# Patient Record
Sex: Female | Born: 1935 | Race: White | Hispanic: No | Marital: Married | Smoking: Never smoker
Health system: Southern US, Community
[De-identification: ages and names within clinical notes are randomized; demographics above are authoritative.]

## PROBLEM LIST (undated history)

## (undated) DIAGNOSIS — C4490 Unspecified malignant neoplasm of skin, unspecified: Secondary | ICD-10-CM

## (undated) DIAGNOSIS — I5020 Unspecified systolic (congestive) heart failure: Secondary | ICD-10-CM

## (undated) DIAGNOSIS — C801 Malignant (primary) neoplasm, unspecified: Secondary | ICD-10-CM

## (undated) DIAGNOSIS — K589 Irritable bowel syndrome without diarrhea: Secondary | ICD-10-CM

## (undated) DIAGNOSIS — T7840XA Allergy, unspecified, initial encounter: Secondary | ICD-10-CM

## (undated) DIAGNOSIS — R011 Cardiac murmur, unspecified: Secondary | ICD-10-CM

## (undated) DIAGNOSIS — E785 Hyperlipidemia, unspecified: Secondary | ICD-10-CM

## (undated) DIAGNOSIS — Z87442 Personal history of urinary calculi: Secondary | ICD-10-CM

## (undated) DIAGNOSIS — R0609 Other forms of dyspnea: Secondary | ICD-10-CM

## (undated) DIAGNOSIS — M47814 Spondylosis without myelopathy or radiculopathy, thoracic region: Secondary | ICD-10-CM

## (undated) DIAGNOSIS — Z8719 Personal history of other diseases of the digestive system: Secondary | ICD-10-CM

## (undated) DIAGNOSIS — I428 Other cardiomyopathies: Secondary | ICD-10-CM

## (undated) DIAGNOSIS — I471 Supraventricular tachycardia: Secondary | ICD-10-CM

## (undated) DIAGNOSIS — M5414 Radiculopathy, thoracic region: Secondary | ICD-10-CM

## (undated) DIAGNOSIS — F1190 Opioid use, unspecified, uncomplicated: Secondary | ICD-10-CM

## (undated) DIAGNOSIS — I2510 Atherosclerotic heart disease of native coronary artery without angina pectoris: Secondary | ICD-10-CM

## (undated) DIAGNOSIS — K224 Dyskinesia of esophagus: Secondary | ICD-10-CM

## (undated) DIAGNOSIS — H25019 Cortical age-related cataract, unspecified eye: Secondary | ICD-10-CM

## (undated) DIAGNOSIS — M4724 Other spondylosis with radiculopathy, thoracic region: Secondary | ICD-10-CM

## (undated) DIAGNOSIS — I5022 Chronic systolic (congestive) heart failure: Secondary | ICD-10-CM

## (undated) DIAGNOSIS — M4804 Spinal stenosis, thoracic region: Secondary | ICD-10-CM

## (undated) DIAGNOSIS — R937 Abnormal findings on diagnostic imaging of other parts of musculoskeletal system: Secondary | ICD-10-CM

## (undated) DIAGNOSIS — I10 Essential (primary) hypertension: Secondary | ICD-10-CM

## (undated) DIAGNOSIS — K219 Gastro-esophageal reflux disease without esophagitis: Secondary | ICD-10-CM

## (undated) DIAGNOSIS — K229 Disease of esophagus, unspecified: Principal | ICD-10-CM

## (undated) DIAGNOSIS — M8580 Other specified disorders of bone density and structure, unspecified site: Secondary | ICD-10-CM

## (undated) DIAGNOSIS — R931 Abnormal findings on diagnostic imaging of heart and coronary circulation: Secondary | ICD-10-CM

## (undated) DIAGNOSIS — I495 Sick sinus syndrome: Secondary | ICD-10-CM

## (undated) DIAGNOSIS — I4729 Other ventricular tachycardia: Secondary | ICD-10-CM

## (undated) DIAGNOSIS — Z95810 Presence of automatic (implantable) cardiac defibrillator: Secondary | ICD-10-CM

## (undated) DIAGNOSIS — Z981 Arthrodesis status: Secondary | ICD-10-CM

## (undated) DIAGNOSIS — B999 Unspecified infectious disease: Secondary | ICD-10-CM

## (undated) DIAGNOSIS — M1990 Unspecified osteoarthritis, unspecified site: Secondary | ICD-10-CM

## (undated) DIAGNOSIS — Z972 Presence of dental prosthetic device (complete) (partial): Secondary | ICD-10-CM

## (undated) DIAGNOSIS — M5410 Radiculopathy, site unspecified: Secondary | ICD-10-CM

## (undated) DIAGNOSIS — I420 Dilated cardiomyopathy: Secondary | ICD-10-CM

## (undated) DIAGNOSIS — M792 Neuralgia and neuritis, unspecified: Secondary | ICD-10-CM

## (undated) DIAGNOSIS — I509 Heart failure, unspecified: Secondary | ICD-10-CM

## (undated) DIAGNOSIS — I350 Nonrheumatic aortic (valve) stenosis: Secondary | ICD-10-CM

## (undated) HISTORY — PX: NISSEN FUNDOPLICATION: SHX2091

## (undated) HISTORY — DX: Allergy, unspecified, initial encounter: T78.40XA

## (undated) HISTORY — PX: TONSILECTOMY, ADENOIDECTOMY, BILATERAL MYRINGOTOMY AND TUBES: SHX2538

## (undated) HISTORY — DX: Dyskinesia of esophagus: K22.4

## (undated) HISTORY — DX: Hyperlipidemia, unspecified: E78.5

## (undated) HISTORY — DX: Spinal stenosis, thoracic region: M48.04

## (undated) HISTORY — DX: Irritable bowel syndrome without diarrhea: K58.9

## (undated) HISTORY — DX: Neuralgia and neuritis, unspecified: M79.2

## (undated) HISTORY — DX: Other specified disorders of bone density and structure, unspecified site: M85.80

## (undated) HISTORY — DX: Arthrodesis status: Z98.1

## (undated) HISTORY — PX: BLEPHAROPLASTY: SUR158

## (undated) HISTORY — DX: Essential (primary) hypertension: I10

## (undated) HISTORY — DX: Disease of esophagus, unspecified: K22.9

## (undated) HISTORY — DX: Other spondylosis with radiculopathy, thoracic region: M47.24

## (undated) HISTORY — DX: Radiculopathy, thoracic region: M54.14

## (undated) HISTORY — DX: Chronic systolic (congestive) heart failure: I50.22

## (undated) HISTORY — PX: BACK SURGERY: SHX140

## (undated) HISTORY — DX: Opioid use, unspecified, uncomplicated: F11.90

## (undated) HISTORY — DX: Gastro-esophageal reflux disease without esophagitis: K21.9

## (undated) HISTORY — DX: Abnormal findings on diagnostic imaging of other parts of musculoskeletal system: R93.7

## (undated) HISTORY — DX: Supraventricular tachycardia: I47.1

## (undated) HISTORY — PX: ABDOMINAL HYSTERECTOMY: SHX81

## (undated) HISTORY — PX: CHOLECYSTECTOMY: SHX55

## (undated) HISTORY — DX: Spondylosis without myelopathy or radiculopathy, thoracic region: M47.814

## (undated) HISTORY — DX: Unspecified infectious disease: B99.9

## (undated) HISTORY — DX: Radiculopathy, site unspecified: M54.10

## (undated) HISTORY — PX: SYMPATHECTOMY: SHX792

## (undated) HISTORY — PX: APPENDECTOMY: SHX54

---

## 2003-09-05 ENCOUNTER — Other Ambulatory Visit: Payer: Self-pay

## 2004-01-07 ENCOUNTER — Ambulatory Visit: Payer: Self-pay | Admitting: Unknown Physician Specialty

## 2004-01-14 ENCOUNTER — Ambulatory Visit: Payer: Self-pay | Admitting: Unknown Physician Specialty

## 2004-01-16 ENCOUNTER — Ambulatory Visit: Payer: Self-pay | Admitting: Internal Medicine

## 2004-01-17 ENCOUNTER — Ambulatory Visit: Payer: Self-pay | Admitting: Unknown Physician Specialty

## 2004-01-24 ENCOUNTER — Ambulatory Visit: Payer: Self-pay | Admitting: Unknown Physician Specialty

## 2004-06-30 ENCOUNTER — Ambulatory Visit: Payer: Self-pay | Admitting: Physician Assistant

## 2004-07-09 ENCOUNTER — Ambulatory Visit: Payer: Self-pay | Admitting: Internal Medicine

## 2004-08-06 ENCOUNTER — Inpatient Hospital Stay: Payer: Self-pay | Admitting: Unknown Physician Specialty

## 2005-01-20 ENCOUNTER — Ambulatory Visit: Payer: Self-pay | Admitting: Internal Medicine

## 2006-02-10 ENCOUNTER — Ambulatory Visit: Payer: Self-pay | Admitting: Internal Medicine

## 2006-12-15 ENCOUNTER — Other Ambulatory Visit: Payer: Self-pay

## 2006-12-15 ENCOUNTER — Ambulatory Visit: Payer: Self-pay

## 2007-02-14 ENCOUNTER — Ambulatory Visit: Payer: Self-pay | Admitting: Internal Medicine

## 2007-09-27 ENCOUNTER — Ambulatory Visit: Payer: Self-pay

## 2007-10-26 ENCOUNTER — Ambulatory Visit: Payer: Self-pay | Admitting: Urology

## 2007-10-27 ENCOUNTER — Ambulatory Visit: Payer: Self-pay | Admitting: Urology

## 2008-02-15 ENCOUNTER — Ambulatory Visit: Payer: Self-pay | Admitting: Internal Medicine

## 2008-07-10 ENCOUNTER — Ambulatory Visit: Payer: Self-pay | Admitting: Physician Assistant

## 2008-07-13 ENCOUNTER — Ambulatory Visit: Payer: Self-pay | Admitting: Unknown Physician Specialty

## 2008-07-16 ENCOUNTER — Inpatient Hospital Stay: Payer: Self-pay | Admitting: Unknown Physician Specialty

## 2009-02-18 ENCOUNTER — Ambulatory Visit: Payer: Self-pay | Admitting: Internal Medicine

## 2010-02-20 ENCOUNTER — Ambulatory Visit: Payer: Self-pay | Admitting: Internal Medicine

## 2011-02-11 ENCOUNTER — Ambulatory Visit: Payer: Self-pay | Admitting: Cardiology

## 2011-02-24 ENCOUNTER — Ambulatory Visit: Payer: Self-pay | Admitting: Internal Medicine

## 2011-08-28 ENCOUNTER — Ambulatory Visit: Payer: Self-pay | Admitting: Internal Medicine

## 2011-08-28 LAB — CREATININE, SERUM
EGFR (African American): 58 — ABNORMAL LOW
EGFR (Non-African Amer.): 50 — ABNORMAL LOW

## 2011-10-27 ENCOUNTER — Ambulatory Visit: Payer: Self-pay | Admitting: Unknown Physician Specialty

## 2011-10-28 LAB — PATHOLOGY REPORT

## 2012-02-25 ENCOUNTER — Ambulatory Visit: Payer: Self-pay | Admitting: Internal Medicine

## 2013-01-18 ENCOUNTER — Ambulatory Visit: Payer: Self-pay | Admitting: Internal Medicine

## 2013-02-27 ENCOUNTER — Ambulatory Visit: Payer: Self-pay | Admitting: Internal Medicine

## 2013-05-01 ENCOUNTER — Ambulatory Visit: Payer: Self-pay | Admitting: Orthopedic Surgery

## 2013-11-07 DIAGNOSIS — I10 Essential (primary) hypertension: Secondary | ICD-10-CM | POA: Insufficient documentation

## 2013-11-07 DIAGNOSIS — E785 Hyperlipidemia, unspecified: Secondary | ICD-10-CM | POA: Insufficient documentation

## 2013-11-07 DIAGNOSIS — I471 Supraventricular tachycardia: Secondary | ICD-10-CM | POA: Insufficient documentation

## 2013-12-19 DIAGNOSIS — M4310 Spondylolisthesis, site unspecified: Secondary | ICD-10-CM | POA: Insufficient documentation

## 2013-12-23 DIAGNOSIS — I10 Essential (primary) hypertension: Secondary | ICD-10-CM | POA: Insufficient documentation

## 2013-12-23 DIAGNOSIS — E871 Hypo-osmolality and hyponatremia: Secondary | ICD-10-CM | POA: Insufficient documentation

## 2013-12-27 ENCOUNTER — Encounter: Payer: Self-pay | Admitting: Internal Medicine

## 2014-01-04 ENCOUNTER — Encounter: Payer: Self-pay | Admitting: Internal Medicine

## 2014-01-18 DIAGNOSIS — R7309 Other abnormal glucose: Secondary | ICD-10-CM | POA: Insufficient documentation

## 2014-02-04 ENCOUNTER — Encounter: Payer: Self-pay | Admitting: Internal Medicine

## 2014-02-28 ENCOUNTER — Ambulatory Visit: Payer: Self-pay | Admitting: Internal Medicine

## 2014-03-06 ENCOUNTER — Encounter: Payer: Self-pay | Admitting: Internal Medicine

## 2014-04-06 ENCOUNTER — Encounter: Payer: Self-pay | Admitting: Internal Medicine

## 2014-06-18 ENCOUNTER — Ambulatory Visit: Payer: Self-pay | Admitting: Gastroenterology

## 2014-06-20 ENCOUNTER — Ambulatory Visit: Payer: Self-pay | Admitting: Gastroenterology

## 2014-10-29 ENCOUNTER — Other Ambulatory Visit: Payer: Self-pay | Admitting: Internal Medicine

## 2014-10-29 DIAGNOSIS — Z1231 Encounter for screening mammogram for malignant neoplasm of breast: Secondary | ICD-10-CM

## 2014-11-13 ENCOUNTER — Encounter: Payer: Self-pay | Admitting: Gastroenterology

## 2014-11-19 ENCOUNTER — Telehealth: Payer: Self-pay | Admitting: Gastroenterology

## 2014-11-19 NOTE — Telephone Encounter (Signed)
Patient has some questions about maybe having a procedure

## 2014-11-22 ENCOUNTER — Other Ambulatory Visit: Payer: Self-pay

## 2014-11-22 DIAGNOSIS — K589 Irritable bowel syndrome without diarrhea: Secondary | ICD-10-CM | POA: Insufficient documentation

## 2014-11-22 DIAGNOSIS — K219 Gastro-esophageal reflux disease without esophagitis: Secondary | ICD-10-CM | POA: Insufficient documentation

## 2014-11-22 HISTORY — DX: Gastro-esophageal reflux disease without esophagitis: K21.9

## 2014-11-22 HISTORY — DX: Irritable bowel syndrome, unspecified: K58.9

## 2014-11-22 NOTE — Telephone Encounter (Signed)
Pt rescheduled to Monday, August 22nd at 1:30pm due to worsening symptoms.

## 2014-11-26 ENCOUNTER — Ambulatory Visit (INDEPENDENT_AMBULATORY_CARE_PROVIDER_SITE_OTHER): Payer: Medicare Other | Admitting: Gastroenterology

## 2014-11-26 ENCOUNTER — Encounter: Payer: Self-pay | Admitting: Gastroenterology

## 2014-11-26 ENCOUNTER — Other Ambulatory Visit: Payer: Self-pay

## 2014-11-26 VITALS — BP 134/58 | HR 60 | Temp 98.0°F | Ht 63.0 in | Wt 176.0 lb

## 2014-11-26 DIAGNOSIS — K229 Disease of esophagus, unspecified: Principal | ICD-10-CM

## 2014-11-26 DIAGNOSIS — K224 Dyskinesia of esophagus: Secondary | ICD-10-CM

## 2014-11-26 DIAGNOSIS — R05 Cough: Secondary | ICD-10-CM | POA: Insufficient documentation

## 2014-11-26 DIAGNOSIS — R059 Cough, unspecified: Secondary | ICD-10-CM | POA: Insufficient documentation

## 2014-11-26 MED ORDER — DILTIAZEM HCL ER COATED BEADS 120 MG PO CP24: 120.0000 mg | ORAL_CAPSULE | Freq: Every day | ORAL | Status: DC

## 2014-11-26 NOTE — Progress Notes (Signed)
Primary Care Physician: Idelle Crouch, MD  Primary Gastroenterologist:  Dr. Lucilla Lame  Chief Complaint  Patient presents with  . Jackhammer esophagus    worsening symptoms    HPI: Allison Mclean is a 79 y.o. 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 The patient was diagnosed with esophageal L obstruction after having a Nissen fundoplication and esophageal manometry. The patient states that the symptoms bother her all the time including waking her up at night. It is not only when she has EN or drinks something. The patient took omeprazole and states that it did not help so she stopped taking it.  Current Outpatient Prescriptions  Medication Sig Dispense Refill  . aspirin EC 81 MG tablet Take 81 mg by mouth.    . estradiol (ESTRACE) 1 MG tablet Take by mouth.    . fluticasone (FLONASE) 50 MCG/ACT nasal spray USE 2 PUFFS EACH NOSTRIL AT BEDTIME    . hydrochlorothiazide (HYDRODIURIL) 25 MG tablet     . meloxicam (MOBIC) 15 MG tablet TAKE 1 TABLET (15 MG TOTAL) BY MOUTH ONCE DAILY.  1  . metoprolol succinate (TOPROL-XL) 50 MG 24 hr tablet Take 50 mg by mouth daily.  3  . Multiple Vitamins-Minerals (OCUVITE EXTRA PO) Take by mouth.    . pravastatin (PRAVACHOL) 80 MG tablet Take 80 mg by mouth at bedtime.  3  . valsartan-hydrochlorothiazide (DIOVAN-HCT) 160-12.5 MG per tablet Take 1 tablet by mouth daily.  3  . Vitamin D, Cholecalciferol, 1000 UNITS TABS Take by mouth.    Marland Kitchen amitriptyline (ELAVIL) 25 MG tablet Take 25 mg by mouth.    . diltiazem (CARDIZEM CD) 120 MG 24 hr capsule Take 1 capsule (120 mg total) by mouth daily. 30 capsule 2  . omeprazole (PRILOSEC) 40 MG capsule TAKE ONE CAPSULE BY MOUTH IN THE EVENING 1 HOUR BEFORE EVENING MEAL  2   No current facility-administered medications for this visit.    Allergies as of 11/26/2014 - Review Complete 11/26/2014  Allergen Reaction Noted  . Rosuvastatin Other (See Comments) 11/22/2014    ROS:  General: Negative  for anorexia, weight loss, fever, chills, fatigue, weakness. ENT: Negative for hoarseness, difficulty swallowing , nasal congestion. CV: Negative for chest pain, angina, palpitations, dyspnea on exertion, peripheral edema.  Respiratory: Negative for dyspnea at rest, dyspnea on exertion, cough, sputum, wheezing.  GI: See history of present illness. GU:  Negative for dysuria, hematuria, urinary incontinence, urinary frequency, nocturnal urination.  Endo: Negative for unusual weight change.    Physical Examination:   BP 134/58 mmHg  Pulse 60  Temp(Src) 98 F (36.7 C) (Oral)  Ht 5\' 3"  (1.6 m)  Wt 176 lb (79.833 kg)  BMI 31.18 kg/m2  General: Well-nourished, well-developed in no acute distress.  Eyes: No icterus. Conjunctivae pink. Mouth: Oropharyngeal mucosa moist and pink , no lesions erythema or exudate. Lungs: Clear to auscultation bilaterally. Non-labored. Heart: Regular rate and rhythm, no murmurs rubs or gallops.  Abdomen: Bowel sounds are normal, nontender, nondistended, no hepatosplenomegaly or masses, no abdominal bruits or hernia , no rebound or guarding.   Extremities: No lower extremity edema. No clubbing or deformities. Neuro: Alert and oriented x 3.  Grossly intact. Skin: Warm and dry, no jaundice.   Psych: Alert and cooperative, normal mood and affect.  Labs:    Imaging Studies: No results found.  Assessment and Plan:   Allison Mclean is a 79 y.o. 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 The patient has been offered calcium channel blockers versus surgery to undo her antireflux wrap. The patient will be started on diltiazem 120 mg extended release. She will also stop her hydrochlorothiazide because of the fear of hypotension. She has been told to contact me if her medications are causing her to be lightheaded a week. She is also been told to stop the diltiazem if that happens. She will  contact me if her symptoms do not improve.   Note: This dictation was prepared with Dragon dictation along with smaller phrase technology. Any transcriptional errors that result from this process are unintentional.

## 2014-12-12 ENCOUNTER — Telehealth: Payer: Self-pay

## 2014-12-12 NOTE — Telephone Encounter (Signed)
Please advise below.

## 2014-12-12 NOTE — Telephone Encounter (Signed)
Seen by Dr. Allen Norris recently and placed on Diltiazem for Jackhammer Esophagus. States medication is not working and pain in epigastric/Left upper quadrant is getting worse she feels like. Denies Nausea, vomiting, diarrhea, and any other abdominal pain.   Pt states that she is able to eat and drink currently without difficulty, is just having pain while eating.  Placed on schedule on 9/12 at 1345 but patient would like to know if she can do anything else in the meantime to help.

## 2014-12-13 ENCOUNTER — Ambulatory Visit: Payer: Self-pay | Admitting: Gastroenterology

## 2014-12-13 NOTE — Telephone Encounter (Signed)
Please start patient on Cialias 2.5 mg

## 2014-12-13 NOTE — Telephone Encounter (Signed)
LVM for pt to return my call.

## 2014-12-14 ENCOUNTER — Other Ambulatory Visit: Payer: Self-pay

## 2014-12-14 ENCOUNTER — Telehealth: Payer: Self-pay | Admitting: Gastroenterology

## 2014-12-14 DIAGNOSIS — K224 Dyskinesia of esophagus: Secondary | ICD-10-CM

## 2014-12-14 MED ORDER — TADALAFIL 2.5 MG PO TABS: 2.5000 mg | ORAL_TABLET | Freq: Every day | ORAL | Status: DC

## 2014-12-14 NOTE — Telephone Encounter (Signed)
Medication was 290.00 for 90 days. Can she get something else?

## 2014-12-14 NOTE — Telephone Encounter (Signed)
Returned pt's call regarding the cost of the medication. Pt will wait until her appt with [facility] on Monday.

## 2014-12-14 NOTE — Telephone Encounter (Signed)
Pt returned my call and was informed of the new rx by Dr. Allen Norris. Rx was sent to pt's pharmacy.

## 2014-12-17 ENCOUNTER — Ambulatory Visit (INDEPENDENT_AMBULATORY_CARE_PROVIDER_SITE_OTHER): Payer: Medicare Other | Admitting: Gastroenterology

## 2014-12-17 ENCOUNTER — Encounter: Payer: Self-pay | Admitting: Gastroenterology

## 2014-12-17 VITALS — BP 123/59 | HR 62 | Temp 97.9°F | Ht 63.0 in | Wt 176.0 lb

## 2014-12-17 DIAGNOSIS — R0789 Other chest pain: Secondary | ICD-10-CM

## 2014-12-17 NOTE — Progress Notes (Signed)
   Primary Care Physician: Idelle Crouch, MD  Primary Gastroenterologist:  Dr. Lucilla Lame  Chief Complaint  Patient presents with  . Follow-up    still having heartburn pain    HPI: Allison Mclean is a 79 y.o. female here for follow-up of her Barnabas Lister hammer esophagus. The patient has been tried on calcium channel blockers and states that Cialis was too expensive for her. The patient continues to have chest discomfort.  Current Outpatient Prescriptions  Medication Sig Dispense Refill  . aspirin EC 81 MG tablet Take 81 mg by mouth.    . estradiol (ESTRACE) 1 MG tablet Take by mouth.    . fluticasone (FLONASE) 50 MCG/ACT nasal spray USE 2 PUFFS EACH NOSTRIL AT BEDTIME    . hydrochlorothiazide (HYDRODIURIL) 25 MG tablet     . meloxicam (MOBIC) 15 MG tablet TAKE 1 TABLET (15 MG TOTAL) BY MOUTH ONCE DAILY.  1  . metoprolol succinate (TOPROL-XL) 50 MG 24 hr tablet Take 50 mg by mouth daily.  3  . Multiple Vitamins-Minerals (OCUVITE EXTRA PO) Take by mouth.    . pravastatin (PRAVACHOL) 80 MG tablet Take 80 mg by mouth at bedtime.  3  . valsartan-hydrochlorothiazide (DIOVAN-HCT) 160-12.5 MG per tablet Take 1 tablet by mouth daily.  3  . Vitamin D, Cholecalciferol, 1000 UNITS TABS Take by mouth.     No current facility-administered medications for this visit.    Allergies as of 12/17/2014 - Review Complete 12/17/2014  Allergen Reaction Noted  . Rosuvastatin Other (See Comments) 11/22/2014    ROS:  General: Negative for anorexia, weight loss, fever, chills, fatigue, weakness. ENT: Negative for hoarseness, difficulty swallowing , nasal congestion. CV: Negative for chest pain, angina, palpitations, dyspnea on exertion, peripheral edema.  Respiratory: Negative for dyspnea at rest, dyspnea on exertion, cough, sputum, wheezing.  GI: See history of present illness. GU:  Negative for dysuria, hematuria, urinary incontinence, urinary frequency, nocturnal urination.  Endo: Negative for unusual  weight change.    Physical Examination:   BP 123/59 mmHg  Pulse 62  Temp(Src) 97.9 F (36.6 C) (Oral)  Ht 5\' 3"  (1.6 m)  Wt 176 lb (79.833 kg)  BMI 31.18 kg/m2  General: Well-nourished, well-developed in no acute distress.  Eyes: No icterus. Conjunctivae pink. Mouth: Oropharyngeal mucosa moist and pink , no lesions erythema or exudate. Lungs: Clear to auscultation bilaterally. Non-labored. Heart: Regular rate and rhythm, no murmurs rubs or gallops.  Abdomen: Bowel sounds are normal, nontender, nondistended, no hepatosplenomegaly or masses, no abdominal bruits or hernia , no rebound or guarding.   Extremities: No lower extremity edema. No clubbing or deformities. Neuro: Alert and oriented x 3.  Grossly intact. Skin: Warm and dry, no jaundice.   Psych: Alert and cooperative, normal mood and affect.  Labs:    Imaging Studies: No results found.  Assessment and Plan:   Allison Mclean is a 79 y.o. 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 The patient has been tried on calcium channel blockers and continues to have symptoms. The patient was unable to afford Cialis. The patient will be sent to Dr. Duke Salvia for evaluation or possible surgery. The patient has been explained the plan and agrees with it.   Note: This dictation was prepared with Dragon dictation along with smaller phrase technology. Any transcriptional errors that result from this process are unintentional.

## 2014-12-20 ENCOUNTER — Other Ambulatory Visit: Payer: Self-pay

## 2014-12-26 ENCOUNTER — Telehealth: Payer: Self-pay | Admitting: Gastroenterology

## 2014-12-26 NOTE — Telephone Encounter (Signed)
No appointment yet with specialist. They haven't call her.

## 2014-12-27 NOTE — Telephone Encounter (Signed)
Pt scheduled with Dr. Duke Salvia in their [place] office on Monday, Sept 26th.

## 2015-01-01 ENCOUNTER — Other Ambulatory Visit: Payer: Self-pay | Admitting: Bariatrics

## 2015-01-01 DIAGNOSIS — R1310 Dysphagia, unspecified: Secondary | ICD-10-CM

## 2015-01-04 ENCOUNTER — Ambulatory Visit
Admission: RE | Admit: 2015-01-04 | Discharge: 2015-01-04 | Disposition: A | Discharge status: Home / Self Care | Payer: Medicare Other | Source: Ambulatory Visit | Attending: Bariatrics | Admitting: Bariatrics

## 2015-01-04 DIAGNOSIS — R1310 Dysphagia, unspecified: Secondary | ICD-10-CM | POA: Diagnosis not present

## 2015-01-28 ENCOUNTER — Telehealth: Payer: Self-pay | Admitting: Gastroenterology

## 2015-01-28 ENCOUNTER — Emergency Department
Admission: EM | Admit: 2015-01-28 | Discharge: 2015-01-28 | Disposition: A | Discharge status: Home / Self Care | Payer: Medicare Other | Attending: Emergency Medicine | Admitting: Emergency Medicine

## 2015-01-28 ENCOUNTER — Emergency Department: Payer: Medicare Other

## 2015-01-28 ENCOUNTER — Other Ambulatory Visit: Payer: Self-pay

## 2015-01-28 DIAGNOSIS — I10 Essential (primary) hypertension: Secondary | ICD-10-CM | POA: Diagnosis not present

## 2015-01-28 DIAGNOSIS — Z7982 Long term (current) use of aspirin: Secondary | ICD-10-CM | POA: Diagnosis not present

## 2015-01-28 DIAGNOSIS — R079 Chest pain, unspecified: Secondary | ICD-10-CM

## 2015-01-28 DIAGNOSIS — R0789 Other chest pain: Secondary | ICD-10-CM | POA: Insufficient documentation

## 2015-01-28 DIAGNOSIS — Z79899 Other long term (current) drug therapy: Secondary | ICD-10-CM | POA: Diagnosis not present

## 2015-01-28 DIAGNOSIS — Z7951 Long term (current) use of inhaled steroids: Secondary | ICD-10-CM | POA: Diagnosis not present

## 2015-01-28 LAB — BASIC METABOLIC PANEL
Anion gap: 11 (ref 5–15)
BUN: 19 mg/dL (ref 6–20)
CHLORIDE: 93 mmol/L — AB (ref 101–111)
CO2: 30 mmol/L (ref 22–32)
CREATININE: 0.94 mg/dL (ref 0.44–1.00)
Calcium: 9.8 mg/dL (ref 8.9–10.3)
GFR calc Af Amer: >60 mL/min (ref >60)
GFR calc non Af Amer: 57 mL/min — ABNORMAL LOW (ref >60)
Glucose, Bld: 124 mg/dL — ABNORMAL HIGH (ref 65–99)
Potassium: 3.6 mmol/L (ref 3.5–5.1)
Sodium: 134 mmol/L — ABNORMAL LOW (ref 135–145)

## 2015-01-28 LAB — CBC
HEMATOCRIT: 42.9 % (ref 35.0–47.0)
HEMOGLOBIN: 14.6 g/dL (ref 12.0–16.0)
MCH: 31.1 pg (ref 26.0–34.0)
MCHC: 34 g/dL (ref 32.0–36.0)
MCV: 91.2 fL (ref 80.0–100.0)
Platelets: 256 10*3/uL (ref 150–440)
RBC: 4.7 MIL/uL (ref 3.80–5.20)
RDW: 13.3 % (ref 11.5–14.5)
WBC: 7.8 10*3/uL (ref 3.6–11.0)

## 2015-01-28 LAB — TROPONIN I: Troponin I: <0.03 ng/mL (ref <0.031)

## 2015-01-28 MED ORDER — HYDROCODONE-ACETAMINOPHEN 5-325 MG PO TABS: 1.0000 | ORAL_TABLET | Freq: Four times a day (QID) | ORAL | Status: DC | PRN

## 2015-01-28 MED ORDER — GI COCKTAIL ~~LOC~~
30.0000 mL | Freq: Once | ORAL | Status: AC
  Administered 2015-01-28: 30 mL via ORAL
  Filled 2015-01-28: qty 30

## 2015-01-28 MED ORDER — SUCRALFATE 1 G PO TABS: 1.0000 g | ORAL_TABLET | Freq: Four times a day (QID) | ORAL | Status: DC

## 2015-01-28 MED ORDER — HYDROMORPHONE HCL 2 MG PO TABS
2.0000 mg | ORAL_TABLET | Freq: Once | ORAL | Status: AC
  Administered 2015-01-28: 2 mg via ORAL
  Filled 2015-01-28: qty 1

## 2015-01-28 NOTE — ED Provider Notes (Signed)
Time Seen: Approximately 1916  I have reviewed the triage notes  Chief Complaint: Chest Pain   History of Present Illness: Allison Mclean is a 79 y.o. female who  was referred here from the walk-in clinic. Patient's had substernal left-sided chest pain radiating to the back now for the last several months and has been evaluated by multiple gastroenterologist and esophageal experts. Describing a similar pain tonight without any shortness of breath, nausea, vomiting. She denies any changes in the typical pain pattern only had a BC powder earlier today for discomfort. She denies any change in the location, characteristics of the pain that states it's more intense than her usual discomfort. Patient denies any abdominal pain, melena, hematochezia. She states she was taken off the proton pump inhibitor therapy because it "" wasn't or doing her any good "". She states she's had a barium swallow and is been told that she has an issue with her esophagus. She denies any trouble with swallowing and states she doesn't feel that something is stuck as far as food products, etc. She has a history of significant for [employer] plication.   Past Medical History  Diagnosis Date  . Jackhammer esophagus   . Hypertension   . Hyperlipidemia   . GERD (gastroesophageal reflux disease)   . Allergy     Patient Active Problem List   Diagnosis Date Noted  . Cough 11/26/2014  . Arthritis 11/26/2014  . Acid reflux 11/22/2014  . 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 Abnormal blood sugar 01/18/2014  . Essential (primary) hypertension 12/23/2013  . Below normal amount of sodium in the blood 12/23/2013  . Acquired spondylolisthesis 12/19/2013  . Neuralgia neuritis, sciatic nerve 12/19/2013  . HLD (hyperlipidemia) 11/07/2013  . BP (high blood pressure) 11/07/2013  . Supraventricular tachycardia ([place]) 11/07/2013    Past Surgical History  Procedure Laterality Date  . Cholecystectomy    . Abdominal hysterectomy    . Back  surgery      2005, 2006, 2015  . Appendectomy    . Tonsilectomy, adenoidectomy, bilateral myringotomy and tubes      Past Surgical History  Procedure Laterality Date  . Cholecystectomy    . Abdominal hysterectomy    . Back surgery      2005, 2006, 2015  . Appendectomy    . Tonsilectomy, adenoidectomy, bilateral myringotomy and tubes      Current Outpatient Rx  Name  Route  Sig  Dispense  Refill  . aspirin EC 81 MG tablet   Oral   Take 81 mg by mouth.         . benzonatate (TESSALON) 100 MG capsule   Oral   Take 100 mg by mouth 3 (three) times daily.      1   . diltiazem (CARDIZEM CD) 120 MG 24 hr capsule               . estradiol (ESTRACE) 1 MG tablet   Oral   Take by mouth.         . fluticasone (FLONASE) 50 MCG/ACT nasal spray      USE 2 PUFFS EACH NOSTRIL AT BEDTIME         . hydrochlorothiazide (HYDRODIURIL) 25 MG tablet               . meloxicam (MOBIC) 15 MG tablet      TAKE 1 TABLET (15 MG TOTAL) BY MOUTH ONCE DAILY.      1   . metoprolol succinate (TOPROL-XL) 50  MG 24 hr tablet   Oral   Take 50 mg by mouth daily.      3   . Multiple Vitamins-Minerals (OCUVITE EXTRA PO)   Oral   Take by mouth.         . pravastatin (PRAVACHOL) 80 MG tablet   Oral   Take 80 mg by mouth at bedtime.      3   . valsartan-hydrochlorothiazide (DIOVAN-HCT) 160-12.5 MG per tablet   Oral   Take 1 tablet by mouth daily.      3   . Vitamin D, Cholecalciferol, 1000 UNITS TABS   Oral   Take by mouth.           Allergies:  Rosuvastatin  Family History: Family History  Problem Relation Age of Onset  . Diabetes Mother   . Arthritis Mother   . Heart disease Mother   . Hypertension Mother   . Diabetes Father   . Asthma Father   . Heart disease Father   . Hypertension Father   . Stroke Father     Social History: Social History  Substance Use Topics  . Smoking status: Never Smoker   . Smokeless tobacco: Never Used  . Alcohol Use: No      Review of Systems:   10 point review of systems was performed and was otherwise negative:  Constitutional: No fever Eyes: No visual disturbances ENT: No sore throat, ear pain Cardiac: Patient describes some sharp substernal chest pain which has been persistent throughout the day. Respiratory: No shortness of breath, wheezing, or stridor Abdomen: No abdominal pain, no vomiting, No diarrhea Endocrine: No weight loss, No night sweats Extremities: No peripheral edema, cyanosis Skin: No rashes, easy bruising Neurologic: No focal weakness, trouble with speech or swollowing Urologic: No dysuria, Hematuria, or urinary frequency    Physical Exam:  ED Triage Vitals  Enc Vitals Group     BP 01/28/15 1818 131/51 mmHg     Pulse Rate 01/28/15 1818 78     Resp 01/28/15 1818 18     Temp 01/28/15 1818 97.9 F (36.6 C)     Temp Source 01/28/15 1818 Oral     SpO2 01/28/15 1818 100 %     Weight 01/28/15 1818 175 lb (79.379 kg)     Height 01/28/15 1818 5\' 3"  (1.6 m)     Head Cir --      Peak Flow --      Pain Score --      Pain Loc --      Pain Edu? --      Excl. in [place]? --     General: Awake , Alert , and Oriented times 3; GCS 15 Head: Normal cephalic , atraumatic Eyes: Pupils equal , round, reactive to light Nose/Throat: No nasal drainage, patent upper airway without erythema or exudate.  Neck: Supple, Full range of motion, No anterior adenopathy or palpable thyroid masses Lungs: Clear to ascultation without wheezes , rhonchi, or rales Heart: Regular rate, regular rhythm without murmurs , gallops , or rubs Abdomen: Soft, non tender without rebound, guarding , or rigidity; bowel sounds positive and symmetric in all 4 quadrants. No organomegaly .        Extremities: 2 plus symmetric pulses. No edema, clubbing or cyanosis Neurologic: normal ambulation, Motor symmetric without deficits, sensory intact Skin: warm, dry, no rashes Currently no reproducible chest wall  discomfort  Labs:   All laboratory work was reviewed including any pertinent negatives or positives listed  below:  Labs Reviewed  BASIC METABOLIC PANEL - Abnormal; Notable for the following:    Sodium 134 (*)    Chloride 93 (*)    Glucose, Bld 124 (*)    GFR calc non Af Amer 57 (*)    All other components within normal limits  CBC  TROPONIN I   review of the laboratory work shows no significant findings and a negative troponin at this time.  EKG:   ED ECG REPORT I, Daymon Larsen, the attending physician, personally viewed and interpreted this ECG.  Date: 01/28/2015 EKG Time: 1816 Rate: *76 Rhythm: normal sinus rhythm with occasional premature ventricular complexes QRS Axis: Left axis deviation Intervals: normal ST/T Wave abnormalities: Left ventricular hypertrophy Conduction Disutrbances: none Narrative Interpretation: unremarkable No acute ischemic changes are noted  Radiology:CHEST 2 VIEW  COMPARISON: Chest radiograph 07/13/2008  FINDINGS: Stable prominent cardiac and mediastinal contours. Elevation of the right hemidiaphragm. No consolidative pulmonary opacities. No pleural effusion or pneumothorax. Right shoulder joint degenerative changes. Focal kyphosis at the level of the lower thoracic spine were there is age-indeterminate anterior height loss of multiple lower thoracic vertebral bodies. Upper abdominal surgical clips. Radiodensities projecting over lower cervical spine demonstrated on the AP view, nonspecific.  IMPRESSION: No acute cardiopulmonary process.  Nonspecific radiodensities projecting over the lower cervical spine on the AP may represent postsurgical hardware involving the lower cervical spine in the appropriate clinical setting.  Focal kyphosis of the lower thoracic spine were there are multiple age-indeterminate compression deformities. Recommend correlation for lower thoracic spine point tenderness.  I personally reviewed the  radiologic studies       ED Course:  Patient was given a GI cocktail with some symptomatic relief. Differential includes all life-threatening causes for chest pain. This includes but is not exclusive to acute coronary syndrome, aortic dissection, pulmonary embolism, cardiac tamponade, community-acquired pneumonia, pericarditis, musculoskeletal chest wall pain, etc. I felt given her history, review of systems and objective studies is most likely was gastrointestinal related. She has a known history of esophagitis and gastritis. Patient's been seen by gastroenterologist along with a esophageal X [place] had a barium swallow, etc. This doesn't seem like new pathology at this time and the objective studies were within normal limits.   Assessment: Gastrointestinal chest pain     Plan:  Outpatient management Patient was advised to return immediately if condition worsens. Patient was advised to follow up with her primary care physician or other specialized physicians involved and in their current assessment.             Daymon Larsen, MD 01/28/15 2053

## 2015-01-28 NOTE — Telephone Encounter (Signed)
Patient is in pain and would like you to call her back today.

## 2015-01-28 NOTE — ED Notes (Addendum)
Patient comes in complaining of left side chest pain that radiated down to left breast. Chest pain in not constant. Denies nausea, vomiting, or shortness of breath.

## 2015-01-28 NOTE — Discharge Instructions (Signed)
Nonspecific Chest Pain  °Chest pain can be caused by many different conditions. There is always a chance that your pain could be related to something serious, such as a heart attack or a blood clot in your lungs. Chest pain can also be caused by conditions that are not life-threatening. If you have chest pain, it is very important to follow up with your health care provider. °CAUSES  °Chest pain can be caused by: °· Heartburn. °· Pneumonia or bronchitis. °· Anxiety or stress. °· Inflammation around your heart (pericarditis) or lung (pleuritis or pleurisy). °· A blood clot in your lung. °· A collapsed lung (pneumothorax). It can develop suddenly on its own (spontaneous pneumothorax) or from trauma to the chest. °· Shingles infection (varicella-zoster virus). °· Heart attack. °· Damage to the bones, muscles, and cartilage that make up your chest wall. This can include: °¨ Bruised bones due to injury. °¨ Strained muscles or cartilage due to frequent or repeated coughing or overwork. °¨ Fracture to one or more ribs. °¨ Sore cartilage due to inflammation (costochondritis). °RISK FACTORS  °Risk factors for chest pain may include: °· Activities that increase your risk for trauma or injury to your chest. °· Respiratory infections or conditions that cause frequent coughing. °· Medical conditions or overeating that can cause heartburn. °· Heart disease or family history of heart disease. °· Conditions or health behaviors that increase your risk of developing a blood clot. °· Having had chicken pox (varicella zoster). °SIGNS AND SYMPTOMS °Chest pain can feel like: °· Burning or tingling on the surface of your chest or deep in your chest. °· Crushing, pressure, aching, or squeezing pain. °· Dull or sharp pain that is worse when you move, cough, or take a deep breath. °· Pain that is also felt in your back, neck, shoulder, or arm, or pain that spreads to any of these areas. °Your chest pain may come and go, or it may stay  constant. °DIAGNOSIS °Lab tests or other studies may be needed to find the cause of your pain. Your health care provider may have you take a test called an ambulatory ECG (electrocardiogram). An ECG records your heartbeat patterns at the time the test is performed. You may also have other tests, such as: °· Transthoracic echocardiogram (TTE). During echocardiography, sound waves are used to create a picture of all of the heart structures and to look at how blood flows through your heart. °· Transesophageal echocardiogram (TEE). This is a more advanced imaging test that obtains images from inside your body. It allows your health care provider to see your heart in finer detail. °· Cardiac monitoring. This allows your health care provider to monitor your heart rate and rhythm in real time. °· Holter monitor. This is a portable device that records your heartbeat and can help to diagnose abnormal heartbeats. It allows your health care provider to track your heart activity for several days, if needed. °· Stress tests. These can be done through exercise or by taking medicine that makes your heart beat more quickly. °· Blood tests. °· Imaging tests. °TREATMENT  °Your treatment depends on what is causing your chest pain. Treatment may include: °· Medicines. These may include: °¨ Acid blockers for heartburn. °¨ Anti-inflammatory medicine. °¨ Pain medicine for inflammatory conditions. °¨ Antibiotic medicine, if an infection is present. °¨ Medicines to dissolve blood clots. °¨ Medicines to treat coronary artery disease. °· Supportive care for conditions that do not require medicines. This may include: °¨ Resting. °¨ Applying heat   or cold packs to injured areas.  Limiting activities until pain decreases. HOME CARE INSTRUCTIONS  If you were prescribed an antibiotic medicine, finish it all even if you start to feel better.  Avoid any activities that bring on chest pain.  Do not use any tobacco products, including  cigarettes, chewing tobacco, or electronic cigarettes. If you need help quitting, ask your health care provider.  Do not drink alcohol.  Take medicines only as directed by your health care provider.  Keep all follow-up visits as directed by your health care provider. This is important. This includes any further testing if your chest pain does not go away.  If heartburn is the cause for your chest pain, you may be told to keep your head raised (elevated) while sleeping. This reduces the chance that acid will go from your stomach into your esophagus.  Make lifestyle changes as directed by your health care provider. These may include:  Getting regular exercise. Ask your health care provider to suggest some activities that are safe for you.  Eating a heart-healthy diet. A registered dietitian can help you to learn healthy eating options.  Maintaining a healthy weight.  Managing diabetes, if necessary.  Reducing stress. SEEK MEDICAL CARE IF:  Your chest pain does not go away after treatment.  You have a rash with blisters on your chest.  You have a fever. SEEK IMMEDIATE MEDICAL CARE IF:   Your chest pain is worse.  You have an increasing cough, or you cough up blood.  You have severe abdominal pain.  You have severe weakness.  You faint.  You have chills.  You have sudden, unexplained chest discomfort.  You have sudden, unexplained discomfort in your arms, back, neck, or jaw.  You have shortness of breath at any time.  You suddenly start to sweat, or your skin gets clammy.  You feel nauseous or you vomit.  You suddenly feel light-headed or dizzy.  Your heart begins to beat quickly, or it feels like it is skipping beats. These symptoms may represent a serious problem that is an emergency. Do not wait to see if the symptoms will go away. Get medical help right away. Call your local emergency services (911 in the [place] Do not drive yourself to the hospital.   This  information is not intended to replace advice given to you by your health care provider. Make sure you discuss any questions you have with your health care provider.   Document Released: 12/31/2004 Document Revised: 04/13/2014 Document Reviewed: 10/27/2013 Elsevier Interactive Patient Education [employer].  Please return immediately if condition worsens. Please contact her primary physician or the physician you were given for referral. If you have any specialist physicians involved in her treatment and plan please also contact them. Thank you for using [facility]. Please avoid any aspirin or nonsteroidal-related products. Tylenol over-the-counter for pain as needed.You also can use Maalox or Mylanta.

## 2015-01-28 NOTE — ED Notes (Signed)
Pt given medication at this time, pt has never taken dilaudid before so will stay here for a few minutes to be monitored for reaction

## 2015-01-28 NOTE — Telephone Encounter (Signed)
Returned Allison Mclean's call regarding her pain. Allison Mclean stating she is hurting under her left breast and this radiates to her back. See note from Dr. Duke Salvia. She stated he told her she wouldn't benefit from doing anything to her esophagus because he thinks it would cause more problems. Allison Mclean has tried probiotics and gas relief pills with no success. Please advise.

## 2015-01-30 NOTE — Telephone Encounter (Signed)
Called pt to see how she was doing since she went to the ER to her pain. Pt stated the medication they gave her is just masking her pain. It is making her feel bad. Advised pt Dr. Allen Norris would like her to go to [facility] to an esophageal specialist. Pt has an appt with PCP on Monday and will discuss this with him then. If she decides to do the referral she will call me back and we can schedule.

## 2015-01-30 NOTE — Telephone Encounter (Signed)
Please refer her to [facility].

## 2015-02-04 ENCOUNTER — Other Ambulatory Visit: Payer: Self-pay | Admitting: Internal Medicine

## 2015-02-04 DIAGNOSIS — R079 Chest pain, unspecified: Secondary | ICD-10-CM

## 2015-02-05 ENCOUNTER — Other Ambulatory Visit: Payer: Self-pay | Admitting: Cardiology

## 2015-02-05 DIAGNOSIS — R0789 Other chest pain: Secondary | ICD-10-CM | POA: Insufficient documentation

## 2015-02-07 ENCOUNTER — Encounter: Payer: Self-pay | Admitting: *Deleted

## 2015-02-07 ENCOUNTER — Ambulatory Visit
Admission: RE | Admit: 2015-02-07 | Discharge: 2015-02-07 | Disposition: A | Discharge status: Home / Self Care | Payer: Medicare Other | Source: Ambulatory Visit | Attending: Cardiology | Admitting: Cardiology

## 2015-02-07 ENCOUNTER — Encounter
Admission: RE | Disposition: A | Discharge status: Home / Self Care | Payer: Self-pay | Source: Ambulatory Visit | Attending: Cardiology

## 2015-02-07 DIAGNOSIS — Z7982 Long term (current) use of aspirin: Secondary | ICD-10-CM | POA: Insufficient documentation

## 2015-02-07 DIAGNOSIS — K219 Gastro-esophageal reflux disease without esophagitis: Secondary | ICD-10-CM | POA: Diagnosis not present

## 2015-02-07 DIAGNOSIS — R079 Chest pain, unspecified: Secondary | ICD-10-CM | POA: Insufficient documentation

## 2015-02-07 DIAGNOSIS — E669 Obesity, unspecified: Secondary | ICD-10-CM | POA: Diagnosis not present

## 2015-02-07 DIAGNOSIS — Z9049 Acquired absence of other specified parts of digestive tract: Secondary | ICD-10-CM | POA: Insufficient documentation

## 2015-02-07 DIAGNOSIS — Z7951 Long term (current) use of inhaled steroids: Secondary | ICD-10-CM | POA: Diagnosis not present

## 2015-02-07 DIAGNOSIS — K589 Irritable bowel syndrome without diarrhea: Secondary | ICD-10-CM | POA: Insufficient documentation

## 2015-02-07 DIAGNOSIS — Z8679 Personal history of other diseases of the circulatory system: Secondary | ICD-10-CM | POA: Diagnosis not present

## 2015-02-07 DIAGNOSIS — Z833 Family history of diabetes mellitus: Secondary | ICD-10-CM | POA: Insufficient documentation

## 2015-02-07 DIAGNOSIS — Z79899 Other long term (current) drug therapy: Secondary | ICD-10-CM | POA: Insufficient documentation

## 2015-02-07 DIAGNOSIS — E785 Hyperlipidemia, unspecified: Secondary | ICD-10-CM | POA: Insufficient documentation

## 2015-02-07 DIAGNOSIS — Z825 Family history of asthma and other chronic lower respiratory diseases: Secondary | ICD-10-CM | POA: Insufficient documentation

## 2015-02-07 DIAGNOSIS — Z791 Long term (current) use of non-steroidal anti-inflammatories (NSAID): Secondary | ICD-10-CM | POA: Diagnosis not present

## 2015-02-07 DIAGNOSIS — I10 Essential (primary) hypertension: Secondary | ICD-10-CM | POA: Insufficient documentation

## 2015-02-07 DIAGNOSIS — Z8249 Family history of ischemic heart disease and other diseases of the circulatory system: Secondary | ICD-10-CM | POA: Insufficient documentation

## 2015-02-07 HISTORY — PX: CARDIAC CATHETERIZATION: SHX172

## 2015-02-07 SURGERY — LEFT HEART CATH AND CORONARY ANGIOGRAPHY
Anesthesia: Moderate Sedation

## 2015-02-07 MED ORDER — HEPARIN (PORCINE) IN NACL 2-0.9 UNIT/ML-% IJ SOLN
INTRAMUSCULAR | Status: AC
  Filled 2015-02-07: qty 1000

## 2015-02-07 MED ORDER — MIDAZOLAM HCL 2 MG/2ML IJ SOLN
INTRAMUSCULAR | Status: DC | PRN
  Administered 2015-02-07: 1 mg via INTRAVENOUS

## 2015-02-07 MED ORDER — SODIUM CHLORIDE 0.9 % WEIGHT BASED INFUSION: 1.0000 mL/kg/h | INTRAVENOUS | Status: DC

## 2015-02-07 MED ORDER — IOHEXOL 300 MG/ML  SOLN
INTRAMUSCULAR | Status: DC | PRN
  Administered 2015-02-07: 30 mL via INTRA_ARTERIAL
  Administered 2015-02-07: 100 mL via INTRA_ARTERIAL
  Administered 2015-02-07: 40 mL via INTRA_ARTERIAL

## 2015-02-07 MED ORDER — FENTANYL CITRATE (PF) 100 MCG/2ML IJ SOLN
INTRAMUSCULAR | Status: DC | PRN
  Administered 2015-02-07: 50 ug via INTRAVENOUS

## 2015-02-07 MED ORDER — SODIUM CHLORIDE 0.9 % IJ SOLN: 3.0000 mL | Freq: Two times a day (BID) | INTRAMUSCULAR | Status: DC

## 2015-02-07 MED ORDER — MIDAZOLAM HCL 2 MG/2ML IJ SOLN
INTRAMUSCULAR | Status: AC
  Filled 2015-02-07: qty 2

## 2015-02-07 MED ORDER — FENTANYL CITRATE (PF) 100 MCG/2ML IJ SOLN
INTRAMUSCULAR | Status: AC
  Filled 2015-02-07: qty 2

## 2015-02-07 MED ORDER — SODIUM CHLORIDE 0.9 % IV SOLN
INTRAVENOUS | Status: DC
  Administered 2015-02-07 (×2): via INTRAVENOUS

## 2015-02-07 MED ORDER — SODIUM CHLORIDE 0.9 % IV SOLN: 250.0000 mL | INTRAVENOUS | Status: DC | PRN

## 2015-02-07 MED ORDER — SODIUM CHLORIDE 0.9 % IJ SOLN: 3.0000 mL | INTRAMUSCULAR | Status: DC | PRN

## 2015-02-07 SURGICAL SUPPLY — 13 items
CATH AMP RT 5F (CATHETERS) ×3 IMPLANT
CATH INFINITI 5 FR 3DRC (CATHETERS) ×3 IMPLANT
CATH INFINITI 5 FR JR5 (CATHETERS) ×3 IMPLANT
CATH INFINITI 5 FR MPA2 (CATHETERS) ×3 IMPLANT
CATH INFINITI 5FR ANG PIGTAIL (CATHETERS) ×3 IMPLANT
CATH INFINITI 5FR JL4 (CATHETERS) ×3 IMPLANT
CATH INFINITI JR4 5F (CATHETERS) ×3 IMPLANT
DEVICE CLOSURE MYNXGRIP 5F (Vascular Products) ×3 IMPLANT
KIT MANI 3VAL PERCEP (MISCELLANEOUS) ×3 IMPLANT
NEEDLE PERC 18GX7CM (NEEDLE) ×3 IMPLANT
PACK CARDIAC CATH (CUSTOM PROCEDURE TRAY) ×3 IMPLANT
SHEATH AVANTI 5FR X 11CM (SHEATH) ×3 IMPLANT
WIRE EMERALD 3MM-J .035X150CM (WIRE) ×3 IMPLANT

## 2015-02-07 NOTE — Progress Notes (Signed)
[  pt doing well post cardiac cath, right groin without bleeding nor hematoma, daughter present, denies complaints, taking po';s without difficulty. rtc appt given,

## 2015-02-07 NOTE — Discharge Instructions (Signed)
Angiogram, Care After °Refer to this sheet in the next few weeks. These instructions provide you with information about caring for yourself after your procedure. Your health care provider may also give you more specific instructions. Your treatment has been planned according to current medical practices, but problems sometimes occur. Call your health care provider if you have any problems or questions after your procedure. °WHAT TO EXPECT AFTER THE PROCEDURE °After your procedure, it is typical to have the following: °· Bruising at the catheter insertion site that usually fades within 1-2 weeks. °· Blood collecting in the tissue (hematoma) that may be painful to the touch. It should usually decrease in size and tenderness within 1-2 weeks. °HOME CARE INSTRUCTIONS °· Take medicines only as directed by your health care provider. °· You may shower 24-48 hours after the procedure or as directed by your health care provider. Remove the bandage (dressing) and gently wash the site with plain soap and water. Pat the area dry with a clean towel. Do not rub the site, because this may cause bleeding. °· Do not take baths, swim, or use a hot tub until your health care provider approves. °· Check your insertion site every day for redness, swelling, or drainage. °· Do not apply powder or lotion to the site. °· Do not lift over 10 lb (4.5 kg) for 5 days after your procedure or as directed by your health care provider. °· Ask your health care provider when it is okay to: °¨ Return to work or school. °¨ Resume usual physical activities or sports. °¨ Resume sexual activity. °· Do not drive home if you are discharged the same day as the procedure. Have someone else drive you. °· You may drive 24 hours after the procedure unless otherwise instructed by your health care provider. °· Do not operate machinery or power tools for 24 hours after the procedure or as directed by your health care provider. °· If your procedure was done as an  outpatient procedure, which means that you went home the same day as your procedure, a responsible adult should be with you for the first 24 hours after you arrive home. °· Keep all follow-up visits as directed by your health care provider. This is important. °SEEK MEDICAL CARE IF: °· You have a fever. °· You have chills. °· You have increased bleeding from the catheter insertion site. Hold pressure on the site. °SEEK IMMEDIATE MEDICAL CARE IF: °· You have unusual pain at the catheter insertion site. °· You have redness, warmth, or swelling at the catheter insertion site. °· You have drainage (other than a small amount of blood on the dressing) from the catheter insertion site. °· The catheter insertion site is bleeding, and the bleeding does not stop after 30 minutes of holding steady pressure on the site. °· The area near or just beyond the catheter insertion site becomes pale, cool, tingly, or numb. °  °This information is not intended to replace advice given to you by your health care provider. Make sure you discuss any questions you have with your health care provider. °  °Document Released: 10/09/2004 Document Revised: 04/13/2014 Document Reviewed: 08/24/2012 °Elsevier Interactive Patient Education ©2016 Elsevier Inc. ° °

## 2015-02-07 NOTE — H&P (Signed)
Date of Service: 02/05/2015 Date of Birth: 1936-02-25 PCP: Idelle Crouch, MD, MD  History of Present Illness: Allison Mclean is a 79 y.o.female patient who returns for follow-up visit. Has a history hypertension as well as hyperlipidemia. She also has a history of paroxysmal supraventricular tachycardia. She. Recently has begun noting chest pain which is severe radiating from the middle of her sternum back to her back down the left arm and into her shoulder. Pain is extremely severe. She was evaluated by a gastroenterology and shows no significant abnormalities with her esophagus. It is of note that she is status post Nissen fundoplication. She has an exertional component to it and to some extent. The causes her to be short of breath when the episodes occur. She has had a functional study an echocardiogram within the last year showing no significant abnormalities. The symptoms have progressed in a becoming more debilitating. Risk and benefits a left cardiac catheterization were explained to the patient and she agrees to proceed. Past Medical and Surgical History  Past Medical History Past Medical History  Diagnosis Date  . Allergic state  Environmental  . Cataract cortical, senile  . Chronic cough  . GERD (gastroesophageal reflux disease)  . History of nephrolithiasis  . History of shingles  . Hyperlipidemia  . Hypertension  . IBS (irritable bowel syndrome)  . Obesity  . SVT (supraventricular tachycardia)   Past Surgical History She has a past surgical history that includes Hysterectomy; Cholecystectomy; Appendectomy; Hemorrhoidectomy External (09/28/01); Tonsillectomy; Posterior laminectomy / decompression lumbar spine (09/11/03); Laparoscopic Esophagogastric Fundoplasty (Nissen Procedure) (2007); Anterior fusion cervical spine; and Anterior fusion cervical spine (07/16/08).   Medications and Allergies  Current Medications  Current Outpatient Prescriptions  Medication Sig Dispense Refill  .  aspirin 81 MG EC tablet Take 81 mg by mouth once daily.  . benzonatate (TESSALON) 100 MG capsule Take by mouth.  . calcium citrate-vitamin D3 (CITRACAL+D) 315-200 mg-unit tablet Take 2 tablets by mouth once daily.  Marland Kitchen estradiol (ESTRACE) 1 MG tablet Take 1 tablet (1 mg total) by mouth once daily. 90 tablet 3  . fluticasone (FLONASE) 50 mcg/actuation nasal spray USE 2 PUFFS EACH NOSTRIL AT BEDTIME 16 g 5  . hydrochlorothiazide (HYDRODIURIL) 25 MG tablet Take 1 tablet (25 mg total) by mouth once daily. 90 tablet 3  . HYDROcodone-acetaminophen (NORCO) 5-325 mg tablet  . meloxicam (MOBIC) 15 MG tablet Take 1 tablet (15 mg total) by mouth once daily. 90 tablet 1  . metoprolol succinate (TOPROL-XL) 50 MG XL tablet Take 1 tablet (50 mg total) by mouth once daily. 90 tablet 3  . pravastatin (PRAVACHOL) 80 MG tablet TAKE 1 TABLET BY MOUTH AT BEDTIME 90 tablet 3  . sucralfate (CARAFATE) 1 gram tablet  . valsartan-hydrochlorothiazide (DIOVAN-HCT) 160-12.5 mg tablet Take 1 tablet by mouth once daily. 90 tablet 3  . vit C-vit E-lutein-min-om-3 150-30-5-150 mg-unit-mg-mg Cap Take 1 capsule by mouth once daily.   No current facility-administered medications for this visit.   Allergies: Crestor [rosuvastatin]  Social and Family History  Social History reports that she has never smoked. She does not have any smokeless tobacco history on file. She reports that she does not drink alcohol.  Family History Family History  Problem Relation Age of Onset  . Heart attack Mother  . Hypertension Mother  . Diabetes type II Mother  . Heart attack Father  . Hypertension Father  . Prostate cancer Father  . Diabetes type II Father  . Stroke Father  . Asthma  Father  . Diabetes type II Sister   Review of Systems  Review of Systems  Constitutional: Negative for chills, diaphoresis, fever, malaise/fatigue and weight loss.  HENT: Negative for congestion, ear discharge, hearing loss and tinnitus.  Eyes: Negative  for blurred vision.  Respiratory: Negative for cough, hemoptysis, sputum production, shortness of breath and wheezing.  Cardiovascular: Positive for chest pain. Negative for palpitations, orthopnea, claudication, leg swelling and PND.  Gastrointestinal: Negative for abdominal pain, blood in stool, constipation, diarrhea, heartburn, melena, nausea and vomiting.  Genitourinary: Negative for dysuria, frequency, hematuria and urgency.  Musculoskeletal: Negative for back pain, falls, joint pain and myalgias.  Skin: Negative for itching and rash.  Neurological: Negative for dizziness, tingling, focal weakness, loss of consciousness, weakness and headaches.  Endo/Heme/Allergies: Negative for polydipsia. Does not bruise/bleed easily.  Psychiatric/Behavioral: Negative for depression, memory loss and substance abuse. The patient is not nervous/anxious.    Physical Examination   Vitals: Visit Vitals  . BP 132/70 (BP Location: Left upper arm, Patient Position: Sitting, BP Cuff Size: Adult)  . Pulse 76  . Resp 12  . Ht 160 cm (5\' 3" )  . Wt 79.4 kg (175 lb)  . BMI 31 kg/m2   Ht:160 cm (5\' 3" ) Wt:79.4 kg (175 lb) MCN:OBSJ surface area is 1.88 meters squared. Body mass index is 31 kg/(m^2).  Wt Readings from Last 3 Encounters:  02/05/15 79.4 kg (175 lb)  02/04/15 79.4 kg (175 lb)  12/20/14 78.9 kg (174 lb)   BP Readings from Last 3 Encounters:  02/05/15 132/70  02/04/15 120/60  12/20/14 128/64   General appearance appears in no acute distress  Head Mouth and Eye exam Normocephalic, without obvious abnormality, atraumatic Dentition is good Eyes appear anicteric   Neck exam Thyroid: normal  Nodes: no obvious adenopathy  LUNGS Breath Sounds: Normal Percussion: Normal  CARDIOVASCULAR JVP CV wave: no HJR: no Elevation at 90 degrees: None Carotid Pulse: normal pulsation bilaterally Bruit: None Apex: apical impulse normal  Auscultation Rhythm: normal sinus rhythm S1:  normal S2: normal Clicks: no Rub: no Murmurs: no murmurs  Gallop: None ABDOMEN Liver enlargement: no Pulsatile aorta: no Ascites: no Bruits: no  EXTREMITIES Clubbing: no Edema: trace to 1+ bilateral pedal edema Pulses: peripheral pulses symmetrical Femoral Bruits: no Amputation: no SKIN Rash: no Cyanosis: no Embolic phemonenon: no Bruising: no NEURO Alert and Oriented to person, place and time: yes Non focal: yes  PSYCH: Pt appears to have normal affect  LABS REVIEWED Last 3 CBC results: Lab Results  Component Value Date  WBC 5.0 10/22/2014  WBC 5.7 04/17/2014  WBC 5.5 01/11/2014   Lab Results  Component Value Date  HGB 14.0 10/22/2014  HGB 13.6 04/17/2014  HGB 12.1 (L) 01/11/2014   Lab Results  Component Value Date  HCT 43.1 10/22/2014  HCT 41.4 04/17/2014  HCT 37.9 01/11/2014   Lab Results  Component Value Date  PLT 236 10/22/2014  PLT 260 04/17/2014  PLT 372 01/11/2014   Lab Results  Component Value Date  CREATININE 0.8 10/22/2014  BUN 9 10/22/2014  NA 134 (L) 10/22/2014  K 4.0 10/22/2014  CL 94 (L) 10/22/2014  CO2 36.0 (H) 10/22/2014   Lab Results  Component Value Date  HGBA1C 5.8 (H) 10/22/2014   Lab Results  Component Value Date  HDL 69.1 10/22/2014  HDL 73.4 04/17/2014  HDL 55.2 01/11/2014   Lab Results  Component Value Date  LDLCALC 117 10/22/2014  LDLCALC 128 04/17/2014  LDLCALC 132 (H) 01/11/2014  Lab Results  Component Value Date  TRIG 140 10/22/2014  TRIG 157 04/17/2014  TRIG 207 (H) 01/11/2014   Lab Results  Component Value Date  ALT 12 10/22/2014  AST 19 10/22/2014  ALKPHOS 47 10/22/2014   Lab Results  Component Value Date  TSH 1.900 10/22/2014   Assessment and Plan   79 y.o. female with  ICD-10-CM ICD-9-CM  1. History of supraventricular tachycardia-no evidence of breakthrough arrhythmias. Will continue with current regimen and follow-up. Z86.79 V12.59  2. Hyperlipidemia, unspecified hyperlipidemia  type-Will continue with pravastatin and low-fat diet LDL goal of less than 100 E78.5 272.4  3. Essential hypertension-blood pressure is controlled with valsartan-hydrochlorothiazide and metoprolol succinate. She is also on hydrochlorothiazide. Will continue with this and dash diet I10 401.9  4. SVT (supraventricular tachycardia)-no breakthrough I47.1 427.89   5. Chest pain etiology is unclear. Will need to further evaluate with a cardiac catheterization given the fact she has had a negative functional study an worsening symptoms to the point where is difficult for her to carry out her daily activities. She has risk factors for heart disease and nonspecific ST T-wave changes on electrocardiogram. Further recommendations after cardiac catheterization is completed.  Return in about 2 weeks (around 02/19/2015).  These notes generated with voice recognition software. I apologize for typographical errors.  Sydnee Levans, MD

## 2015-02-14 ENCOUNTER — Ambulatory Visit
Admission: RE | Admit: 2015-02-14 | Discharge: 2015-02-14 | Disposition: A | Discharge status: Home / Self Care | Payer: Medicare Other | Source: Ambulatory Visit | Attending: Internal Medicine | Admitting: Internal Medicine

## 2015-02-14 DIAGNOSIS — R079 Chest pain, unspecified: Secondary | ICD-10-CM | POA: Diagnosis present

## 2015-03-04 ENCOUNTER — Ambulatory Visit
Admission: RE | Admit: 2015-03-04 | Discharge: 2015-03-04 | Disposition: A | Discharge status: Home / Self Care | Payer: Medicare Other | Source: Ambulatory Visit | Attending: Internal Medicine | Admitting: Internal Medicine

## 2015-03-04 ENCOUNTER — Other Ambulatory Visit: Payer: Self-pay | Admitting: Internal Medicine

## 2015-03-04 DIAGNOSIS — Z1231 Encounter for screening mammogram for malignant neoplasm of breast: Secondary | ICD-10-CM

## 2015-03-05 ENCOUNTER — Other Ambulatory Visit: Payer: Self-pay | Admitting: Internal Medicine

## 2015-03-05 DIAGNOSIS — R928 Other abnormal and inconclusive findings on diagnostic imaging of breast: Secondary | ICD-10-CM

## 2015-03-14 ENCOUNTER — Ambulatory Visit
Admission: RE | Admit: 2015-03-14 | Discharge: 2015-03-14 | Disposition: A | Discharge status: Home / Self Care | Payer: Medicare Other | Source: Ambulatory Visit | Attending: Internal Medicine | Admitting: Internal Medicine

## 2015-03-14 ENCOUNTER — Ambulatory Visit: Payer: Medicare Other

## 2015-03-14 DIAGNOSIS — R928 Other abnormal and inconclusive findings on diagnostic imaging of breast: Secondary | ICD-10-CM | POA: Insufficient documentation

## 2015-03-26 ENCOUNTER — Other Ambulatory Visit: Payer: Self-pay | Admitting: Physical Medicine and Rehabilitation

## 2015-03-26 DIAGNOSIS — M5414 Radiculopathy, thoracic region: Secondary | ICD-10-CM

## 2015-04-09 ENCOUNTER — Other Ambulatory Visit: Payer: Self-pay | Admitting: Physical Medicine and Rehabilitation

## 2015-04-09 DIAGNOSIS — M5414 Radiculopathy, thoracic region: Secondary | ICD-10-CM

## 2015-04-12 ENCOUNTER — Encounter: Payer: Self-pay | Admitting: Emergency Medicine

## 2015-04-12 ENCOUNTER — Emergency Department: Payer: Medicare Other

## 2015-04-12 ENCOUNTER — Emergency Department
Admission: EM | Admit: 2015-04-12 | Discharge: 2015-04-12 | Disposition: A | Discharge status: Home / Self Care | Payer: Medicare Other | Attending: Student | Admitting: Student

## 2015-04-12 DIAGNOSIS — R110 Nausea: Secondary | ICD-10-CM | POA: Insufficient documentation

## 2015-04-12 DIAGNOSIS — M546 Pain in thoracic spine: Secondary | ICD-10-CM | POA: Diagnosis not present

## 2015-04-12 DIAGNOSIS — Z7982 Long term (current) use of aspirin: Secondary | ICD-10-CM | POA: Diagnosis not present

## 2015-04-12 DIAGNOSIS — Z79899 Other long term (current) drug therapy: Secondary | ICD-10-CM | POA: Insufficient documentation

## 2015-04-12 DIAGNOSIS — Z791 Long term (current) use of non-steroidal anti-inflammatories (NSAID): Secondary | ICD-10-CM | POA: Diagnosis not present

## 2015-04-12 DIAGNOSIS — I10 Essential (primary) hypertension: Secondary | ICD-10-CM | POA: Diagnosis not present

## 2015-04-12 DIAGNOSIS — M5489 Other dorsalgia: Secondary | ICD-10-CM

## 2015-04-12 DIAGNOSIS — R1013 Epigastric pain: Secondary | ICD-10-CM | POA: Diagnosis not present

## 2015-04-12 DIAGNOSIS — M549 Dorsalgia, unspecified: Secondary | ICD-10-CM | POA: Diagnosis not present

## 2015-04-12 DIAGNOSIS — R109 Unspecified abdominal pain: Secondary | ICD-10-CM | POA: Diagnosis present

## 2015-04-12 LAB — URINALYSIS COMPLETE WITH MICROSCOPIC (ARMC ONLY)
Bilirubin Urine: NEGATIVE
Glucose, UA: NEGATIVE mg/dL
HGB URINE DIPSTICK: NEGATIVE
Ketones, ur: NEGATIVE mg/dL
LEUKOCYTES UA: NEGATIVE
NITRITE: NEGATIVE
Protein, ur: NEGATIVE mg/dL
Specific Gravity, Urine: 1.005 (ref 1.005–1.030)
pH: 7 (ref 5.0–8.0)

## 2015-04-12 LAB — COMPREHENSIVE METABOLIC PANEL
ALBUMIN: 3.8 g/dL (ref 3.5–5.0)
ALT: 12 U/L — ABNORMAL LOW (ref 14–54)
ANION GAP: 9 (ref 5–15)
AST: 17 U/L (ref 15–41)
Alkaline Phosphatase: 55 U/L (ref 38–126)
BILIRUBIN TOTAL: 0.8 mg/dL (ref 0.3–1.2)
BUN: 13 mg/dL (ref 6–20)
CO2: 32 mmol/L (ref 22–32)
Calcium: 10.1 mg/dL (ref 8.9–10.3)
Chloride: 94 mmol/L — ABNORMAL LOW (ref 101–111)
Creatinine, Ser: 0.79 mg/dL (ref 0.44–1.00)
GFR calc Af Amer: >60 mL/min (ref >60)
Glucose, Bld: 86 mg/dL (ref 65–99)
POTASSIUM: 3.5 mmol/L (ref 3.5–5.1)
Sodium: 135 mmol/L (ref 135–145)
TOTAL PROTEIN: 7 g/dL (ref 6.5–8.1)

## 2015-04-12 LAB — DIFFERENTIAL
Basophils Absolute: 0.1 10*3/uL (ref 0–0.1)
Basophils Relative: 1 %
EOS PCT: 3 %
Eosinophils Absolute: 0.2 10*3/uL (ref 0–0.7)
LYMPHS ABS: 1.6 10*3/uL (ref 1.0–3.6)
LYMPHS PCT: 25 %
MONO ABS: 0.7 10*3/uL (ref 0.2–0.9)
Monocytes Relative: 10 %
NEUTROS ABS: 3.9 10*3/uL (ref 1.4–6.5)
NEUTROS PCT: 61 %

## 2015-04-12 LAB — CBC
HEMATOCRIT: 39.4 % (ref 35.0–47.0)
HEMOGLOBIN: 13.3 g/dL (ref 12.0–16.0)
MCH: 30.2 pg (ref 26.0–34.0)
MCHC: 33.8 g/dL (ref 32.0–36.0)
MCV: 89.5 fL (ref 80.0–100.0)
Platelets: 294 10*3/uL (ref 150–440)
RBC: 4.41 MIL/uL (ref 3.80–5.20)
RDW: 12.4 % (ref 11.5–14.5)
WBC: 6.6 10*3/uL (ref 3.6–11.0)

## 2015-04-12 LAB — LIPASE, BLOOD: LIPASE: 74 U/L — AB (ref 11–51)

## 2015-04-12 LAB — TROPONIN I: Troponin I: <0.03 ng/mL (ref <0.031)

## 2015-04-12 MED ORDER — IOHEXOL 300 MG/ML  SOLN
100.0000 mL | Freq: Once | INTRAMUSCULAR | Status: AC | PRN
  Administered 2015-04-12: 100 mL via INTRAVENOUS

## 2015-04-12 MED ORDER — HYDROCODONE-ACETAMINOPHEN 5-325 MG PO TABS
1.0000 | ORAL_TABLET | Freq: Four times a day (QID) | ORAL | Status: DC | PRN
Start: 2015-04-12 — End: 2015-07-09

## 2015-04-12 MED ORDER — HYDROCODONE-ACETAMINOPHEN 5-325 MG PO TABS
1.0000 | ORAL_TABLET | Freq: Once | ORAL | Status: AC
  Administered 2015-04-12: 1 via ORAL
  Filled 2015-04-12: qty 1

## 2015-04-12 MED ORDER — SODIUM CHLORIDE 0.9 % IV BOLUS (SEPSIS)
500.0000 mL | Freq: Once | INTRAVENOUS | Status: AC
  Administered 2015-04-12: 500 mL via INTRAVENOUS

## 2015-04-12 MED ORDER — ONDANSETRON HCL 4 MG/2ML IJ SOLN
4.0000 mg | Freq: Once | INTRAMUSCULAR | Status: AC
Start: 2015-04-12 — End: 2015-04-12
  Administered 2015-04-12: 4 mg via INTRAVENOUS
  Filled 2015-04-12: qty 2

## 2015-04-12 MED ORDER — IOHEXOL 240 MG/ML SOLN
25.0000 mL | Freq: Once | INTRAMUSCULAR | Status: AC | PRN
  Administered 2015-04-12: 25 mL via ORAL

## 2015-04-12 MED ORDER — MORPHINE SULFATE (PF) 4 MG/ML IV SOLN
4.0000 mg | Freq: Once | INTRAVENOUS | Status: AC
  Administered 2015-04-12: 4 mg via INTRAVENOUS
  Filled 2015-04-12: qty 1

## 2015-04-12 NOTE — ED Notes (Addendum)
Brought over from [facility] with upper abd pain which radiates into back  Describes pain as a spasm states sx's started about 1 year ago

## 2015-04-12 NOTE — ED Provider Notes (Signed)
[facility] Emergency Department Provider Note  ____________________________________________  Time seen: Approximately 10:08 AM  I have reviewed the triage vital signs and the nursing notes.   HISTORY  Chief Complaint Abdominal Pain    HPI Allison Mclean is a 80 y.o. female with history of Jackhammer esophagus, hypertension, hyperlipidemia, GERD who presents for evaluation of nearly one year of intermittent back spasms radiating to the epigastrium, occurring with increased frequency over the past week, sudden onset, usually resolve within 45 seconds, intermittent, currently mild. No chest pain or difficulty breathing. She has chronic nausea but no vomiting or diarrhea, no fevers or chills. She had a cholecystectomy approximately 30 years ago. She had a cardiac catheterization 2 months ago which showed normal coronary arteries. She denies any bowel or bladder incontinence, personal history of malignancy, saddle anesthesia, numbness or weakness in the legs.   Past Medical History  Diagnosis Date  . Jackhammer esophagus   . Hypertension   . Hyperlipidemia   . GERD (gastroesophageal reflux disease)   . Allergy     Patient Active Problem List   Diagnosis Date Noted  . Cough 11/26/2014  . Arthritis 11/26/2014  . Acid reflux 11/22/2014  . 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 Abnormal blood sugar 01/18/2014  . Essential (primary) hypertension 12/23/2013  . Below normal amount of sodium in the blood 12/23/2013  . Acquired spondylolisthesis 12/19/2013  . Neuralgia neuritis, sciatic nerve 12/19/2013  . HLD (hyperlipidemia) 11/07/2013  . BP (high blood pressure) 11/07/2013  . Supraventricular tachycardia ([place]) 11/07/2013    Past Surgical History  Procedure Laterality Date  . Cholecystectomy    . Abdominal hysterectomy    . Back surgery      2005, 2006, 2015  . Appendectomy    . Tonsilectomy, adenoidectomy, bilateral myringotomy and tubes    . Cardiac  catheterization N/A 02/07/2015    Procedure: Left Heart Cath and Coronary Angiography;  Surgeon: Teodoro Spray, MD;  Location: [facility];  Service: Cardiovascular;  Laterality: N/A;    Current Outpatient Rx  Name  Route  Sig  Dispense  Refill  . aspirin EC 81 MG tablet   Oral   Take 81 mg by mouth.         . estradiol (ESTRACE) 1 MG tablet   Oral   Take 1 mg by mouth daily.          . fluticasone (FLONASE) 50 MCG/ACT nasal spray      USE 2 PUFFS EACH NOSTRIL AT BEDTIME         . hydrochlorothiazide (HYDRODIURIL) 25 MG tablet   Oral   Take 25 mg by mouth daily.          . meloxicam (MOBIC) 15 MG tablet      TAKE 1 TABLET (15 MG TOTAL) BY MOUTH ONCE DAILY.      1   . metoprolol succinate (TOPROL-XL) 50 MG 24 hr tablet   Oral   Take 50 mg by mouth daily.      3   . Multiple Vitamins-Minerals (OCUVITE EXTRA PO)   Oral   Take 1 tablet by mouth daily.          . pravastatin (PRAVACHOL) 80 MG tablet   Oral   Take 80 mg by mouth at bedtime.      3   . valsartan-hydrochlorothiazide (DIOVAN-HCT) 160-12.5 MG per tablet   Oral   Take 1 tablet by mouth daily.      3   .  Vitamin D, Cholecalciferol, 1000 UNITS TABS   Oral   Take 1 tablet by mouth daily.          Marland Kitchen HYDROcodone-acetaminophen (NORCO/VICODIN) 5-325 MG tablet   Oral   Take 1 tablet by mouth every 6 (six) hours as needed for moderate pain.   10 tablet   0     Allergies Rosuvastatin  Family History  Problem Relation Age of Onset  . Diabetes Mother   . Arthritis Mother   . Heart disease Mother   . Hypertension Mother   . Diabetes Father   . Asthma Father   . Heart disease Father   . Hypertension Father   . Stroke Father   . Breast cancer Neg Hx     Social History Social History  Substance Use Topics  . Smoking status: Never Smoker   . Smokeless tobacco: Never Used  . Alcohol Use: No    Review of Systems Constitutional: No fever/chills Eyes: No visual  changes. ENT: No sore throat. Cardiovascular: Denies chest pain. Respiratory: Denies shortness of breath. Gastrointestinal: + abdominal pain.  + nausea, no vomiting.  No diarrhea.  No constipation. Genitourinary: Negative for dysuria. Musculoskeletal: Positive for back pain. Skin: Negative for rash. Neurological: Negative for headaches, focal weakness or numbness.  10-point ROS otherwise negative.  ____________________________________________   PHYSICAL EXAM:  VITAL SIGNS: ED Triage Vitals  Enc Vitals Group     BP 04/12/15 0858 128/60 mmHg     Pulse Rate 04/12/15 0858 66     Resp 04/12/15 0858 20     Temp 04/12/15 0858 98.2 F (36.8 C)     Temp Source 04/12/15 0858 Oral     SpO2 04/12/15 0858 96 %     Weight 04/12/15 0858 175 lb (79.379 kg)     Height 04/12/15 0858 5\' 3"  (1.6 m)     Head Cir --      Peak Flow --      Pain Score 04/12/15 0858 6     Pain Loc --      Pain Edu? --      Excl. in [place]? --     Constitutional: Alert and oriented. Well appearing and in no acute distress. Eyes: Conjunctivae are normal. PERRL. EOMI. Head: Atraumatic. Nose: No congestion/rhinnorhea. Mouth/Throat: Mucous membranes are moist.  Oropharynx non-erythematous. Neck: No stridor.  Cardiovascular: Normal rate, regular rhythm. Grossly normal heart sounds.  Good peripheral circulation. Respiratory: Normal respiratory effort.  No retractions. Lungs CTAB. Gastrointestinal: Soft with moderate tenderness in the epigastrium. No CVA tenderness. Genitourinary: deferred Musculoskeletal: No lower extremity tenderness nor edema.  No joint effusions. No midline T or L-spine tenderness to palpation. Neurologic:  Normal speech and language. No gross focal neurologic deficits are appreciated.  5 out of 5 strength in bilateral upper and lower extremities, sensation intact to light touch throughout. Normal strength with dorsiflexion of the big toes bilaterally. Skin:  Skin is warm, dry and intact. No rash  noted. Psychiatric: Mood and affect are normal. Speech and behavior are normal.  ____________________________________________   LABS (all labs ordered are listed, but only abnormal results are displayed)  Labs Reviewed  LIPASE, BLOOD - Abnormal; Notable for the following:    Lipase 74 (*)    All other components within normal limits  COMPREHENSIVE METABOLIC PANEL - Abnormal; Notable for the following:    Chloride 94 (*)    ALT 12 (*)    All other components within normal limits  URINALYSIS COMPLETEWITH MICROSCOPIC [facility]  ONLY) - Abnormal; Notable for the following:    Color, Urine STRAW (*)    APPearance CLEAR (*)    Bacteria, UA RARE (*)    Squamous Epithelial / LPF 0-5 (*)    All other components within normal limits  CBC  TROPONIN I  DIFFERENTIAL   ____________________________________________  EKG  ED ECG REPORT I, Joanne Gavel, the attending physician, personally viewed and interpreted this ECG.   Date: 04/12/2015  EKG Time: 09:33  Rate: 67  Rhythm: normal sinus rhythm  Axis: normal  Intervals:none  ST&T Change: No acute ST elevation. LVH with repolarization abnormality.  ____________________________________________  RADIOLOGY  CT abdomen and pelvis IMPRESSION: 1. Progressive dilatation of the common hepatic duct and common bile duct from CT of 08/28/2011 is likely related to cholecystectomy. Recommend correlation bilirubin level. 2. Atherosclerotic calcification of the abdominal aorta. 3. Posterior lumbar fusion without complication.  ____________________________________________   PROCEDURES  Procedure(s) performed: None  Critical Care performed: No  ____________________________________________   INITIAL IMPRESSION / ASSESSMENT AND PLAN / ED COURSE  Pertinent labs & imaging results that were available during my care of the patient were reviewed by me and considered in my medical decision making (see chart for details).  Allison Mclean is a  80 y.o. female with history of Jackhammer esophagus, hypertension, hyperlipidemia, GERD who presents for evaluation of nearly one year of intermittent back spasms radiating to the epigastrium. Currently she is not having a spasm but reports that they are happening with increased frequency over the past week. On exam she is in no acute distress. Vital signs are stable, she is afebrile. She does have tenderness in the epigastrium. I suspect her symptoms may well be related to her known jackhammer esophagus however given age, severity of her discomfort, plan for screening abdominal labs as well as CT of the abdomen and pelvis which will also help to examine the aortic contours.  ----------------------------------------- 1:41 PM on 04/12/2015 ----------------------------------------- Patient with significant improvement of pain at this time. She reports she feels much better. Labs reviewed. Normal CBC, CMP, negative troponin, lipase was elevated at 74 however CT of the abdomen and pelvis is unremarkable for any acute intra-abdominal or intrapelvic process and there is no radiographic evidence of pancreatitis. Urinalysis is not consistent with infection. I discussed with the patient that her pain may be related to esophageal spasms/jackhammer esophagus. There are no red flags concerning for cauda equina or epidural abscess. We discussed return precautions, need for close PCP follow-up, she is comfortable with the discharge plan. DC home. ____________________________________________   FINAL CLINICAL IMPRESSION(S) / ED DIAGNOSES  Final diagnoses:  Acute epigastric pain  Midline back pain, unspecified location      Joanne Gavel, MD 04/12/15 1344

## 2015-04-17 ENCOUNTER — Telehealth: Payer: Self-pay | Admitting: Gastroenterology

## 2015-04-17 ENCOUNTER — Ambulatory Visit: Payer: Medicare Other

## 2015-04-17 NOTE — Telephone Encounter (Signed)
I have faxed all clinic notes and referral form to [facility] gastroenterology specialty clinic @ [phone] for Dysphagia, esophageal spasms and jackhammer esophagus. I was advised that once the physician reviews the notes that they will call her and make an appointment that would be convenient to the patient. I also noted for this to be scheduled/reviewed ASAP.  I will follow up with [facility] to see if this has been scheduled within a week and will document the date and time of appointment in the chart.

## 2015-04-17 NOTE — Telephone Encounter (Signed)
Would you please refer this pt to [facility] esophageal disorders clinic for Jackhammer esophagus K22.9 and dysphagia R13.10 and esophageal spasms K22.4 as soon as possible. Thank you!

## 2015-04-17 NOTE — Telephone Encounter (Signed)
Patient called and said she is ready to make that appointment with the esophagus specialist. Please call

## 2015-04-29 ENCOUNTER — Ambulatory Visit
Admission: RE | Admit: 2015-04-29 | Discharge: 2015-04-29 | Disposition: A | Discharge status: Home / Self Care | Payer: Medicare Other | Source: Ambulatory Visit | Attending: Physical Medicine and Rehabilitation | Admitting: Physical Medicine and Rehabilitation

## 2015-04-29 DIAGNOSIS — Q7649 Other congenital malformations of spine, not associated with scoliosis: Secondary | ICD-10-CM | POA: Insufficient documentation

## 2015-04-29 DIAGNOSIS — M4804 Spinal stenosis, thoracic region: Secondary | ICD-10-CM | POA: Insufficient documentation

## 2015-04-29 DIAGNOSIS — M4324 Fusion of spine, thoracic region: Secondary | ICD-10-CM | POA: Diagnosis not present

## 2015-04-29 DIAGNOSIS — M4690 Unspecified inflammatory spondylopathy, site unspecified: Secondary | ICD-10-CM | POA: Insufficient documentation

## 2015-04-29 DIAGNOSIS — M5124 Other intervertebral disc displacement, thoracic region: Secondary | ICD-10-CM | POA: Diagnosis not present

## 2015-04-29 DIAGNOSIS — M5414 Radiculopathy, thoracic region: Secondary | ICD-10-CM | POA: Diagnosis present

## 2015-04-29 DIAGNOSIS — R0781 Pleurodynia: Secondary | ICD-10-CM | POA: Diagnosis present

## 2015-07-02 ENCOUNTER — Ambulatory Visit: Payer: Medicare Other | Attending: Pain Medicine | Admitting: Pain Medicine

## 2015-07-02 ENCOUNTER — Encounter: Payer: Self-pay | Admitting: Pain Medicine

## 2015-07-02 VITALS — BP 116/54 | HR 79 | Temp 97.8°F | Resp 18 | Ht 63.0 in | Wt 170.0 lb

## 2015-07-02 DIAGNOSIS — M539 Dorsopathy, unspecified: Secondary | ICD-10-CM

## 2015-07-02 DIAGNOSIS — K589 Irritable bowel syndrome without diarrhea: Secondary | ICD-10-CM | POA: Diagnosis not present

## 2015-07-02 DIAGNOSIS — M8580 Other specified disorders of bone density and structure, unspecified site: Secondary | ICD-10-CM | POA: Diagnosis not present

## 2015-07-02 DIAGNOSIS — M4806 Spinal stenosis, lumbar region: Secondary | ICD-10-CM | POA: Insufficient documentation

## 2015-07-02 DIAGNOSIS — M899 Disorder of bone, unspecified: Secondary | ICD-10-CM

## 2015-07-02 DIAGNOSIS — Z79891 Long term (current) use of opiate analgesic: Secondary | ICD-10-CM | POA: Diagnosis not present

## 2015-07-02 DIAGNOSIS — I10 Essential (primary) hypertension: Secondary | ICD-10-CM | POA: Diagnosis not present

## 2015-07-02 DIAGNOSIS — M5410 Radiculopathy, site unspecified: Secondary | ICD-10-CM | POA: Insufficient documentation

## 2015-07-02 DIAGNOSIS — Z79899 Other long term (current) drug therapy: Secondary | ICD-10-CM | POA: Diagnosis not present

## 2015-07-02 DIAGNOSIS — G8918 Other acute postprocedural pain: Secondary | ICD-10-CM | POA: Insufficient documentation

## 2015-07-02 DIAGNOSIS — K5289 Other specified noninfective gastroenteritis and colitis: Secondary | ICD-10-CM | POA: Diagnosis not present

## 2015-07-02 DIAGNOSIS — M5414 Radiculopathy, thoracic region: Secondary | ICD-10-CM | POA: Insufficient documentation

## 2015-07-02 DIAGNOSIS — Z981 Arthrodesis status: Secondary | ICD-10-CM | POA: Diagnosis not present

## 2015-07-02 DIAGNOSIS — Z0189 Encounter for other specified special examinations: Secondary | ICD-10-CM | POA: Insufficient documentation

## 2015-07-02 DIAGNOSIS — Z5181 Encounter for therapeutic drug level monitoring: Secondary | ICD-10-CM | POA: Insufficient documentation

## 2015-07-02 DIAGNOSIS — M47894 Other spondylosis, thoracic region: Secondary | ICD-10-CM

## 2015-07-02 DIAGNOSIS — G8929 Other chronic pain: Secondary | ICD-10-CM | POA: Insufficient documentation

## 2015-07-02 DIAGNOSIS — M4804 Spinal stenosis, thoracic region: Secondary | ICD-10-CM | POA: Insufficient documentation

## 2015-07-02 DIAGNOSIS — Z13820 Encounter for screening for osteoporosis: Secondary | ICD-10-CM

## 2015-07-02 DIAGNOSIS — M4802 Spinal stenosis, cervical region: Secondary | ICD-10-CM | POA: Diagnosis not present

## 2015-07-02 DIAGNOSIS — F1190 Opioid use, unspecified, uncomplicated: Secondary | ICD-10-CM | POA: Diagnosis not present

## 2015-07-02 DIAGNOSIS — M25519 Pain in unspecified shoulder: Secondary | ICD-10-CM | POA: Diagnosis present

## 2015-07-02 DIAGNOSIS — K598 Other specified functional intestinal disorders: Secondary | ICD-10-CM

## 2015-07-02 DIAGNOSIS — M419 Scoliosis, unspecified: Secondary | ICD-10-CM | POA: Insufficient documentation

## 2015-07-02 DIAGNOSIS — M961 Postlaminectomy syndrome, not elsewhere classified: Secondary | ICD-10-CM | POA: Insufficient documentation

## 2015-07-02 DIAGNOSIS — K219 Gastro-esophageal reflux disease without esophagitis: Secondary | ICD-10-CM | POA: Insufficient documentation

## 2015-07-02 DIAGNOSIS — E785 Hyperlipidemia, unspecified: Secondary | ICD-10-CM | POA: Insufficient documentation

## 2015-07-02 DIAGNOSIS — Z9889 Other specified postprocedural states: Secondary | ICD-10-CM | POA: Insufficient documentation

## 2015-07-02 DIAGNOSIS — M47814 Spondylosis without myelopathy or radiculopathy, thoracic region: Secondary | ICD-10-CM

## 2015-07-02 DIAGNOSIS — M549 Dorsalgia, unspecified: Secondary | ICD-10-CM | POA: Diagnosis present

## 2015-07-02 DIAGNOSIS — M5384 Other specified dorsopathies, thoracic region: Secondary | ICD-10-CM

## 2015-07-02 DIAGNOSIS — R937 Abnormal findings on diagnostic imaging of other parts of musculoskeletal system: Secondary | ICD-10-CM

## 2015-07-02 HISTORY — DX: Arthrodesis status: Z98.1

## 2015-07-02 HISTORY — DX: Opioid use, unspecified, uncomplicated: F11.90

## 2015-07-02 HISTORY — DX: Other specified disorders of bone density and structure, unspecified site: M85.80

## 2015-07-02 HISTORY — DX: Spinal stenosis, thoracic region: M48.04

## 2015-07-02 HISTORY — DX: Other spondylosis, thoracic region: M47.894

## 2015-07-02 NOTE — Patient Instructions (Addendum)
Instructed to go get labs and xrays done in the medical mall as soon as possible.  Instructed to call and make a follow up appt when all labs, xrays and Dr Marjory Lies appointment completed.GENERAL RISKS AND COMPLICATIONS  What are the risk, side effects and possible complications? Generally speaking, most procedures are safe.  However, with any procedure there are risks, side effects, and the possibility of complications.  The risks and complications are dependent upon the sites that are lesioned, or the type of nerve block to be performed.  The closer the procedure is to the spine, the more serious the risks are.  Great care is taken when placing the radio frequency needles, block needles or lesioning probes, but sometimes complications can occur. 1. Infection: Any time there is an injection through the skin, there is a risk of infection.  This is why sterile conditions are used for these blocks.  There are four possible types of infection. 1. Localized skin infection. 2. Central Nervous System Infection-This can be in the form of Meningitis, which can be deadly. 3. Epidural Infections-This can be in the form of an epidural abscess, which can cause pressure inside of the spine, causing compression of the spinal cord with subsequent paralysis. This would require an emergency surgery to decompress, and there are no guarantees that the patient would recover from the paralysis. 4. Discitis-This is an infection of the intervertebral discs.  It occurs in about 1% of discography procedures.  It is difficult to treat and it may lead to surgery.        2. Pain: the needles have to go through skin and soft tissues, will cause soreness.       3. Damage to internal structures:  The nerves to be lesioned may be near blood vessels or    other nerves which can be potentially damaged.       4. Bleeding: Bleeding is more common if the patient is taking blood thinners such as  aspirin, Coumadin, Ticiid, Plavix, etc., or if  he/she have some genetic predisposition  such as hemophilia. Bleeding into the spinal canal can cause compression of the spinal  cord with subsequent paralysis.  This would require an emergency surgery to  decompress and there are no guarantees that the patient would recover from the  paralysis.       5. Pneumothorax:  Puncturing of a lung is a possibility, every time a needle is introduced in  the area of the chest or upper back.  Pneumothorax refers to free air around the  collapsed lung(s), inside of the thoracic cavity (chest cavity).  Another two possible  complications related to a similar event would include: Hemothorax and Chylothorax.   These are variations of the Pneumothorax, where instead of air around the collapsed  lung(s), you may have blood or chyle, respectively.       6. Spinal headaches: They may occur with any procedures in the area of the spine.       7. Persistent CSF (Cerebro-Spinal Fluid) leakage: This is a rare problem, but may occur  with prolonged intrathecal or epidural catheters either due to the formation of a fistulous  track or a dural tear.       8. Nerve damage: By working so close to the spinal cord, there is always a possibility of  nerve damage, which could be as serious as a permanent spinal cord injury with  paralysis.       9. Death:  Although rare,  severe deadly allergic reactions known as "Anaphylactic  reaction" can occur to any of the medications used.      10. Worsening of the symptoms:  We can always make thing worse.  What are the chances of something like this happening? Chances of any of this occuring are extremely low.  By statistics, you have more of a chance of getting killed in a motor vehicle accident: while driving to the hospital than any of the above occurring .  Nevertheless, you should be aware that they are possibilities.  In general, it is similar to taking a shower.  Everybody knows that you can slip, hit your head and get killed.  Does that mean  that you should not shower again?  Nevertheless always keep in mind that statistics do not mean anything if you happen to be on the wrong side of them.  Even if a procedure has a 1 (one) in a 1,000,000 (million) chance of going wrong, it you happen to be that one..Also, keep in mind that by statistics, you have more of a chance of having something go wrong when taking medications.  Who should not have this procedure? If you are on a blood thinning medication (e.g. Coumadin, Plavix, see list of "Blood Thinners"), or if you have an active infection going on, you should not have the procedure.  If you are taking any blood thinners, please inform your physician.  How should I prepare for this procedure?  Do not eat or drink anything at least six hours prior to the procedure.  Bring a driver with you .  It cannot be a taxi.  Come accompanied by an adult that can drive you back, and that is strong enough to help you if your legs get weak or numb from the local anesthetic.  Take all of your medicines the morning of the procedure with just enough water to swallow them.  If you have diabetes, make sure that you are scheduled to have your procedure done first thing in the morning, whenever possible.  If you have diabetes, take only half of your insulin dose and notify our nurse that you have done so as soon as you arrive at the clinic.  If you are diabetic, but only take blood sugar pills (oral hypoglycemic), then do not take them on the morning of your procedure.  You may take them after you have had the procedure.  Do not take aspirin or any aspirin-containing medications, at least eleven (11) days prior to the procedure.  They may prolong bleeding.  Wear loose fitting clothing that may be easy to take off and that you would not mind if it got stained with Betadine or blood.  Do not wear any jewelry or perfume  Remove any nail coloring.  It will interfere with some of our monitoring  equipment.  NOTE: Remember that this is not meant to be interpreted as a complete list of all possible complications.  Unforeseen problems may occur.  BLOOD THINNERS The following drugs contain aspirin or other products, which can cause increased bleeding during surgery and should not be taken for 2 weeks prior to and 1 week after surgery.  If you should need take something for relief of minor pain, you may take acetaminophen which is found in Tylenol,m Datril, Anacin-3 and Panadol. It is not blood thinner. The products listed below are.  Do not take any of the products listed below in addition to any listed on your instruction sheet.  A.P.C  or A.P.C with Codeine Codeine Phosphate Capsules #3 Ibuprofen Ridaura  ABC compound Congesprin Imuran rimadil  Advil Cope Indocin Robaxisal  Alka-Seltzer Effervescent Pain Reliever and Antacid Coricidin or Coricidin-D  Indomethacin Rufen  Alka-Seltzer plus Cold Medicine Cosprin Ketoprofen S-A-C Tablets  Anacin Analgesic Tablets or Capsules Coumadin Korlgesic Salflex  Anacin Extra Strength Analgesic tablets or capsules CP-2 Tablets Lanoril Salicylate  Anaprox Cuprimine Capsules Levenox Salocol  Anexsia-D Dalteparin Magan Salsalate  Anodynos Darvon compound Magnesium Salicylate Sine-off  Ansaid Dasin Capsules Magsal Sodium Salicylate  Anturane Depen Capsules Marnal Soma  APF Arthritis pain formula Dewitt's Pills Measurin Stanback  Argesic Dia-Gesic Meclofenamic Sulfinpyrazone  Arthritis Bayer Timed Release Aspirin Diclofenac Meclomen Sulindac  Arthritis pain formula Anacin Dicumarol Medipren Supac  Analgesic (Safety coated) Arthralgen Diffunasal Mefanamic Suprofen  Arthritis Strength Bufferin Dihydrocodeine Mepro Compound Suprol  Arthropan liquid Dopirydamole Methcarbomol with Aspirin Synalgos  ASA tablets/Enseals Disalcid Micrainin Tagament  Ascriptin Doan's Midol Talwin  Ascriptin A/D Dolene Mobidin Tanderil  Ascriptin Extra Strength Dolobid  Moblgesic Ticlid  Ascriptin with Codeine Doloprin or Doloprin with Codeine Momentum Tolectin  Asperbuf Duoprin Mono-gesic Trendar  Aspergum Duradyne Motrin or Motrin IB Triminicin  Aspirin plain, buffered or enteric coated Durasal Myochrisine Trigesic  Aspirin Suppositories Easprin Nalfon Trillsate  Aspirin with Codeine Ecotrin Regular or Extra Strength Naprosyn Uracel  Atromid-S Efficin Naproxen Ursinus  Auranofin Capsules Elmiron Neocylate Vanquish  Axotal Emagrin Norgesic Verin  Azathioprine Empirin or Empirin with Codeine Normiflo Vitamin E  Azolid Emprazil Nuprin Voltaren  Bayer Aspirin plain, buffered or children's or timed BC Tablets or powders Encaprin Orgaran Warfarin Sodium  Buff-a-Comp Enoxaparin Orudis Zorpin  Buff-a-Comp with Codeine Equegesic Os-Cal-Gesic   Buffaprin Excedrin plain, buffered or Extra Strength Oxalid   Bufferin Arthritis Strength Feldene Oxphenbutazone   Bufferin plain or Extra Strength Feldene Capsules Oxycodone with Aspirin   Bufferin with Codeine Fenoprofen Fenoprofen Pabalate or Pabalate-SF   Buffets II Flogesic Panagesic   Buffinol plain or Extra Strength Florinal or Florinal with Codeine Panwarfarin   Buf-Tabs Flurbiprofen Penicillamine   Butalbital Compound Four-way cold tablets Penicillin   Butazolidin Fragmin Pepto-Bismol   Carbenicillin Geminisyn Percodan   Carna Arthritis Reliever Geopen Persantine   Carprofen Gold's salt Persistin   Chloramphenicol Goody's Phenylbutazone   Chloromycetin Haltrain Piroxlcam   Clmetidine heparin Plaquenil   Cllnoril Hyco-pap Ponstel   Clofibrate Hydroxy chloroquine Propoxyphen         Before stopping any of these medications, be sure to consult the physician who ordered them.  Some, such as Coumadin (Warfarin) are ordered to prevent or treat serious conditions such as "deep thrombosis", "pumonary embolisms", and other heart problems.  The amount of time that you may need off of the medication may also vary with  the medication and the reason for which you were taking it.  If you are taking any of these medications, please make sure you notify your pain physician before you undergo any procedures.         Epidural Steroid Injection Patient Information  Description: The epidural space surrounds the nerves as they exit the spinal cord.  In some patients, the nerves can be compressed and inflamed by a bulging disc or a tight spinal canal (spinal stenosis).  By injecting steroids into the epidural space, we can bring irritated nerves into direct contact with a potentially helpful medication.  These steroids act directly on the irritated nerves and can reduce swelling and inflammation which often leads to decreased pain.  Epidural steroids may be injected  anywhere along the spine and from the neck to the low back depending upon the location of your pain.   After numbing the skin with local anesthetic (like Novocaine), a small needle is passed into the epidural space slowly.  You may experience a sensation of pressure while this is being done.  The entire block usually last less than 10 minutes.  Conditions which may be treated by epidural steroids:   Low back and leg pain  Neck and arm pain  Spinal stenosis  Post-laminectomy syndrome  Herpes zoster (shingles) pain  Pain from compression fractures  Preparation for the injection:  1. Do not eat any solid food or dairy products within 8 hours of your appointment.  2. You may drink clear liquids up to 3 hours before appointment.  Clear liquids include water, black coffee, juice or soda.  No milk or cream please. 3. You may take your regular medication, including pain medications, with a sip of water before your appointment  Diabetics should hold regular insulin (if taken separately) and take 1/2 normal NPH dos the morning of the procedure.  Carry some sugar containing items with you to your appointment. 4. A driver must accompany you and be prepared  to drive you home after your procedure.  5. Bring all your current medications with your. 6. An IV may be inserted and sedation may be given at the discretion of the physician.   7. A blood pressure cuff, EKG and other monitors will often be applied during the procedure.  Some patients may need to have extra oxygen administered for a short period. 8. You will be asked to provide medical information, including your allergies, prior to the procedure.  We must know immediately if you are taking blood thinners (like Coumadin/Warfarin)  Or if you are allergic to IV iodine contrast (dye). We must know if you could possible be pregnant.  Possible side-effects:  Bleeding from needle site  Infection (rare, may require surgery)  Nerve injury (rare)  Numbness & tingling (temporary)  Difficulty urinating (rare, temporary)  Spinal headache ( a headache worse with upright posture)  Light -headedness (temporary)  Pain at injection site (several days)  Decreased blood pressure (temporary)  Weakness in arm/leg (temporary)  Pressure sensation in back/neck (temporary)  Call if you experience:  Fever/chills associated with headache or increased back/neck pain.  Headache worsened by an upright position.  New onset weakness or numbness of an extremity below the injection site  Hives or difficulty breathing (go to the emergency room)  Inflammation or drainage at the infection site  Severe back/neck pain  Any new symptoms which are concerning to you  Please note:  Although the local anesthetic injected can often make your back or neck feel good for several hours after the injection, the pain will likely return.  It takes 3-7 days for steroids to work in the epidural space.  You may not notice any pain relief for at least that one week.  If effective, we will often do a series of three injections spaced 3-6 weeks apart to maximally decrease your pain.  After the initial series, we generally  will wait several months before considering a repeat injection of the same type.  If you have any questions, please call [phone] [facility]

## 2015-07-02 NOTE — Progress Notes (Signed)
Patient's Name: Allison Mclean MRN: [id number] DOB: 08/11/1935 DOS: 07/02/2015  Primary Reason(s) for Visit: Initial Patient Evaluation CC: Shoulder Pain and Back Pain   HPI  Allison Mclean is a 80 y.o. year old, female patient, who comes today for an initial evaluation. She has Acquired spondylolisthesis; Essential (primary) hypertension; Acid reflux; HLD (hyperlipidemia); Adaptive colitis; Supraventricular tachycardia ([place]); Cough; Atypical chest pain; Chronic pain; Long term current use of opiate analgesic; Long term prescription opiate use; Opiate use; Encounter for therapeutic drug level monitoring; Encounter for pain management planning; Osteopenia, senile; Abnormal MRI, thoracic spine; Thoracic central spinal stenosis (T7-8, T8-9, T9-10, T10-11, T11-12, and T12-L1); Thoracic foraminal stenosis (Moderate to severe at: Right T5; Bilateral T9; Left T11); Chronic thoracic radicular pain (T5/T6) (Left); History of lumbar fusion (12/19/2013) (posterior lumbar fusion from L2-L5); Failed back surgical syndrome; Thoracic facet syndrome (Left); and History of fusion of cervical spine on her problem list.. Her primarily concern today is the Shoulder Pain and Back Pain   The patient comes in today clinics today for the first time for chronic pain management evaluation.  Reported Pain Score: 2  Reported level is inconsistent with clinical obrservations. Pain Type: Chronic pain Pain Descriptors / Indicators: Aching Pain Frequency: Intermittent  Onset and Duration: Sudden and Present longer than 3 months Cause of pain: Unknown Severity: No change since onset, NAS-11 at its worse: 9/10, NAS-11 at its best: 0/10, NAS-11 now: 3/10 and NAS-11 on the average: 2/10 Timing: Night and After a period of immobility Aggravating Factors: Prolonged sitting and Prolonged standing Alleviating Factors: Medications Associated Problems: Pain that wakes patient up and Pain that does not allow patient to sleep Quality of  Pain: Getting longer, Pressure-like and Tender Previous Examinations or Tests: CT scan and MRI scan Previous Treatments: Narcotic medications  Historic Controlled Substance Pharmacotherapy Review  Previously Prescribed Opioids:  Analgesic: Hydrocodone/APAP 10/325 one every 6 hours (40 mg/day) MME/day: 40 mg/day Pharmacokinetics: Onset of action (Liberation/Absorption): Within expected pharmacological parameters Time to Peak effect (Distribution): Timing and results are as within normal expected parameters Duration of action (Metabolism/Excretion): Within normal limits for medication Pharmacodynamics: Analgesic Effect: More than 50% Activity Facilitation: Medication(s) allow patient to sit, stand, walk, and do the basic ADLs Perceived Effectiveness: Described as relatively effective, allowing for increase in activities of daily living (ADL) Side-effects or Adverse reactions: None reported Historical Background Evaluation: [place] PDMP: Five (5) year initial data search conducted. No abnormal patterns identified [place] [employer] Offender Public Information: Non-contributory Historical Hospital-associated UDS Results:  No results found for: THCU, COCAINSCRNUR, PCPSCRNUR, MDMA, AMPHETMU, METHADONE, ETOH UDS Results: No UDS available, at this time UDS Interpretation: No UDS available, at this time Medication Assessment Form: Not applicable. Initial evaluation. The patient has not received any medications from our practice Treatment compliance: Not applicable. Initial evaluation Risk Assessment: Aberrant Behavior: None observed today Opioid Fatal Overdose Risk Factors: None detected today Substance Use Disorder (SUD) Risk Level: Pending results of Medical Psychology Evaluation for SUD Opioid Risk Tool (ORT) Score: Total Score: 2 Low Risk for SUD (Score <3) Depression Scale Score: PHQ-2: PHQ-2 Total Score: 0 No depression (0) PHQ-9: PHQ-9 Total Score: 0 No depression  (0-4)  Pharmacologic Plan: Pending ordered tests and/or consults  Neuromodulation Therapy Review  Type: No neuromodulatory devices implanted Side-effects or Adverse reactions: No device reported Effectiveness: No device reported  Allergies  Ms. Tamayo is allergic to rosuvastatin.  Meds  The patient has a current medication list which includes the following prescription(s): aspirin  ec, cyclobenzaprine, estradiol, fluticasone, hydrochlorothiazide, hydrocodone-acetaminophen, hydrocodone-acetaminophen, meloxicam, metoprolol succinate, multiple vitamins-minerals, pravastatin, valsartan-hydrochlorothiazide, and vitamin d (cholecalciferol). Requested Prescriptions    No prescriptions requested or ordered in this encounter    ROS  Cardiovascular History: Daily Aspirin intake, Hypertension and Heart murmur Pulmonary or Respiratory History: Snoring  Neurological History: Scoliosis Psychological-Psychiatric History: Negative for anxiety, depression, schizophrenia, bipolar disorders or suicidal ideations or attempts Gastrointestinal History: Reflux or heatburn Genitourinary History: Kidney disease Hematological History: Negative for anticoagulant therapy, anemia, bruising or bleeding easily, hemophilia, sickle cell disease or trait, thrombocytopenia or coagulupathies Endocrine History: Negative for diabetes or thyroid disease Rheumatologic History: Negative for lupus, osteoarthritis, rheumatoid arthritis, myositis, polymyositis or fibromyagia Musculoskeletal History: Negative for myasthenia gravis, muscular dystrophy, multiple sclerosis or malignant hyperthermia Work History: Retired  [employer]  Medical:  Allison Mclean  has a past medical history of [place] esophagus; Hypertension; Hyperlipidemia; GERD (gastroesophageal reflux disease); and Allergy. Family: family history includes Arthritis in her mother; Asthma in her father; Diabetes in her father and mother; Heart disease in her father and  mother; Hypertension in her father and mother; Stroke in her father. There is no history of Breast cancer. Surgical:  has past surgical history that includes Cholecystectomy; Abdominal hysterectomy; Back surgery; Appendectomy; Tonsilectomy, adenoidectomy, bilateral myringotomy and tubes; and Cardiac catheterization (N/A, 02/07/2015). Tobacco:  reports that she has never smoked. She has never used smokeless tobacco. Alcohol:  reports that she does not drink alcohol. Drug:  reports that she does not use illicit drugs.  Physical Exam  Vitals:  Today's Vitals   07/02/15 1355  BP: 116/54  Pulse: 79  Temp: 97.8 F (36.6 C)  TempSrc: Oral  Resp: 18  Height: 5\' 3"  (1.6 m)  Weight: 170 lb (77.111 kg)  SpO2: 97%  PainSc: 2     Calculated BMI: Body mass index is 30.12 kg/(m^2). Obese (Class I) (30-34.9 kg/m2) - 68% higher incidence of chronic pain  General appearance: alert, cooperative, appears stated age and mild distress Eyes: PERLA Respiratory: No evidence respiratory distress, no audible rales or ronchi and no use of accessory muscles of respiration  Cervical Spine Inspection: Normal anatomy Alignment: Symetrical ROM: Adequate Palpation: WNL Provocative Tests: Spurling's Foraminal Stenosis Test: deferred Hoffman's Cervical Myelopathy Test: deferred Lhermitte Sign (Cord Compression/Myelopathy Test):  deferred Occipital Nerve Palpation: deferred  Upper Extremities Inspection: No gross anomalies detected ROM: Adequate Sensory: Normal Motor: Unremarkable Pulses: Palpable DTR:  Biceps (C5): WNL Brachioradialis (C6): WNL Triceps (C7): WNL  Thoracic Spine Inspection: No gross anomalies detected Alignment: Symetrical ROM: Adequate Palpation: WNL  Lumbar Spine Inspection: No gross anomalies detected Alignment: Symetrical ROM: Adequate Palpation: WNL Provocative Tests: Lumbar Hyperextension and rotation test: deferred Patrick's Maneuver: deferred Gait: WNL  Lower  Extremities Inspection: No gross anomalies detected ROM: Adequate Sensory: Normal Motor: Unremarkable  Toe walk (S1): WNL  Heal walk (L5): WNL Pulses: Palpable DTR:  Patellar (L4): WNL Achilles (S1): WNL   Assessment  Primary Diagnosis & Pertinent Problem List: The primary encounter diagnosis was Chronic pain. Diagnoses of Long term current use of opiate analgesic, Long term prescription opiate use, Opiate use, Encounter for therapeutic drug level monitoring, Encounter for pain management planning, Osteoporosis screening, Osteopenia, senile, Adaptive colitis, Abnormal MRI, thoracic spine, Thoracic central spinal stenosis (T7-T8, T8-T9, T9-T10, T10-T11, T11-T12, and T12-L1), Thoracic foraminal stenosis (Moderate to severe at: Right T5; Bilateral T9; Left T11), Chronic thoracic radicular pain, History of lumbar fusion, Failed back surgical syndrome, Thoracic facet syndrome (Left), and History of fusion of cervical spine  were also pertinent to this visit.  Visit Diagnosis: 1. Chronic pain   2. Long term current use of opiate analgesic   3. Long term prescription opiate use   4. Opiate use   5. Encounter for therapeutic drug level monitoring   6. Encounter for pain management planning   7. Osteoporosis screening   8. Osteopenia, senile   9. Adaptive colitis   10. Abnormal MRI, thoracic spine   11. Thoracic central spinal stenosis (T7-T8, T8-T9, T9-T10, T10-T11, T11-T12, and T12-L1)   12. Thoracic foraminal stenosis (Moderate to severe at: Right T5; Bilateral T9; Left T11)   13. Chronic thoracic radicular pain   14. History of lumbar fusion   15. Failed back surgical syndrome   16. Thoracic facet syndrome (Left)   17. History of fusion of cervical spine     Assessment: Abnormal MRI, thoracic spine EXAM: MRI THORACIC SPINE WITHOUT CONTRAST TECHNIQUE: Multiplanar, multisequence MR imaging of the thoracic spine was performed. No intravenous contrast was administered. COMPARISON:  Chest CT 02/14/2015. Cervical spine MRI 07/10/2008. FINDINGS: Limited sagittal imaging of the cervical spine remarkable for interval ACDF placement at C5-C6 and C6-C7. Stable lumbar vertebral height and alignment since November. 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 No marrow edema or evidence of acute osseous abnormality. Aberrant origin of the right subclavian artery, normal anatomic variant. Stable visualized thoracic and upper abdominal viscera. Posterior Thoracic paraspinal soft tissues appear within normal limits. No posterior rib abnormality identified. There are partially visible postoperative changes to the upper lumbar spine posterior soft tissues at L2. T1-2: Mild disc bulge. Mild to moderate facet hypertrophy. No spinal stenosis. Mild to moderate T1 foraminal stenosis greater on the left. T2-3: Mild disc bulge. Mild facet hypertrophy. Mild to moderate T2 foraminal stenosis greater on the right. T3-4: Mild disc bulge. Mild to moderate facet hypertrophy. Mild to moderate T3 foraminal stenosis, greater on the left. T4-5: Congenital incomplete segmentation. Otherwise negative. T5-6: Mild left eccentric disc bulge. Moderate to severe facet hypertrophy. Moderate to severe left and mild-to-moderate right T5 foraminal stenosis. T6-7: Mild disc bulge. Mild facet hypertrophy greater on the left. Mild to moderate left T6 foraminal stenosis. T7-8: Mild disc bulge. Moderate facet and ligament flavum hypertrophy. Mild spinal stenosis (series 7, image 20). No spinal cord mass effect. Mild to moderate right T7 foraminal stenosis. T8-9: Circumferential disc bulge with superimposed small central disc protrusion best seen on series 8, image 23. Mild to moderate facet and ligament flavum hypertrophy. Mild spinal stenosis (series 7, image 23), mild if any ventral cord mass effect. Mild left  T8 foraminal stenosis. T9-10: Circumferential disc bulge. Moderate to severe facet and ligament flavum hypertrophy. Mild spinal stenosis (series 7, image 25). No cord mass effect. Moderate to severe bilateral T9 foraminal stenosis. T10-11: Mild right eccentric disc bulge. Mild to moderate facet and ligament flavum hypertrophy. Somewhat dysplastic appearance of the right posterior elements, confirmed on the comparison CT and with posterior element ankylosis on the right (Series 4, image 4). Borderline to mild spinal stenosis. No spinal cord mass effect. Mild left T10 foraminal stenosis. T11-12: Anterior eccentric circumferential disc bulge. Moderate to severe facet and ligament flavum hypertrophy. Mild spinal stenosis (series 7, image 32). No spinal cord mass effect. Moderate to severe left T11 foraminal stenosis. Mild right T11 foraminal stenosis. T12-L1: Circumferential disc bulge with superimposed broad-based left paracentral disc protrusion best seen on series 7,  image 35. Mild facet and ligament flavum hypertrophy. Mild spinal stenosis, with up to mild spinal cord mass effect (series 7, image 35). No foraminal stenosis. No thoracic spinal cord signal abnormality. Conus medullaris occurs below the L1-L2 level. IMPRESSION: 1. 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 Subsequent moderate to severe adjacent segment facet arthropathy. 2. Widespread thoracic spine disc bulging. Occasional small superimposed disc protrusions (T8-T9, T11-T12). 3. Subsequent multifactorial mild thoracic spinal stenosis T7-8, T8-9, T9-10, T10-11, T11-12, and T12-L1. Up to mild spinal cord mass effect at T8-9 and T12-L1, no thoracic spinal cord signal abnormality. 4. Multifactorial moderate to severe neural foraminal stenosis at the right T5, bilateral T9 and left T11 nerve levels. 5. Lower cervical spine ACDF, new since 2010.  Chronic thoracic radicular pain (T5/T6)  (Left) Thoracic pain is likely to be secondary to severe left T5 foraminal stenosis.    Plan of Care  Note: As per protocol, today's visit has been an evaluation only. We have not taken over the patient's controlled substance management.  Pharmacotherapy (Medications Ordered): No orders of the defined types were placed in this encounter.    Lab-work & Procedure Ordered: Orders Placed This Encounter  Procedures  . THORACIC EPIDURAL STEROID INJECTION    Standing Status: Future     Number of Occurrences:      Standing Expiration Date: 07/01/2016    Scheduling Instructions:     Side: Left-sided     Sedation: Patient's choice     Timeframe: ASAA    Order Specific Question:  Where will this procedure be performed?    Answer:  [facility]  . Compliance Drug Analysis, Ur    Volume: 30 ml(s). Minimum 3 ml of urine is needed. Document temperature of fresh sample. Indications: Long term (current) use of opiate analgesic (Z79.891) Test#: [id number] (Comprehensive Profile)  . C-reactive protein    Standing Status: Future     Number of Occurrences:      Standing Expiration Date: 08/01/2015  . Magnesium    Standing Status: Future     Number of Occurrences:      Standing Expiration Date: 08/01/2015  . Sedimentation rate    Standing Status: Future     Number of Occurrences:      Standing Expiration Date: 08/01/2015  . Vitamin D 1,25 dihydroxy    Standing Status: Future     Number of Occurrences:      Standing Expiration Date: 08/01/2015  . Ambulatory referral to Psychology    Referral Priority:  Routine    Referral Type:  Psychiatric    Referral Reason:  Specialty Services Required    Referred to Provider:  Beckey Rutter, PHD    Requested Specialty:  Psychology    Number of Visits Requested:  1    Imaging Ordered: AMB REFERRAL TO PSYCHOLOGY  Interventional Therapies: Scheduled: None at this time. PRN Procedures: None at this time.    Referral(s) or Consult(s):  Medical psychology consult for substance use disorder evaluation.  Medications administered during this visit: Ms. Heyde does not currently have medications on file.  Prescriptions ordered during this visit: New Prescriptions   No medications on file    Future Appointments Date Time Provider [place]  07/09/2015 8:30 AM Milinda Pointer, MD [facility] [place] None    Primary Care Physician: Idelle Crouch, MD Location: [facility] Note by: Kathlen Brunswick. Dossie Arbour, M.D, DABA, DABAPM, DABPM, DABIPP, FIPP

## 2015-07-02 NOTE — Assessment & Plan Note (Signed)
Thoracic pain is likely to be secondary to severe left T5 foraminal stenosis.

## 2015-07-02 NOTE — Assessment & Plan Note (Addendum)
EXAM: MRI THORACIC SPINE WITHOUT CONTRAST TECHNIQUE: Multiplanar, multisequence MR imaging of the thoracic spine was performed. No intravenous contrast was administered. COMPARISON: Chest CT 02/14/2015. Cervical spine MRI 07/10/2008. FINDINGS: Limited sagittal imaging of the cervical spine remarkable for interval ACDF placement at C5-C6 and C6-C7. Stable lumbar vertebral height and alignment since November. 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 No marrow edema or evidence of acute osseous abnormality. Aberrant origin of the right subclavian artery, normal anatomic variant. Stable visualized thoracic and upper abdominal viscera. Posterior Thoracic paraspinal soft tissues appear within normal limits. No posterior rib abnormality identified. There are partially visible postoperative changes to the upper lumbar spine posterior soft tissues at L2. T1-2: Mild disc bulge. Mild to moderate facet hypertrophy. No spinal stenosis. Mild to moderate T1 foraminal stenosis greater on the left. T2-3: Mild disc bulge. Mild facet hypertrophy. Mild to moderate T2 foraminal stenosis greater on the right. T3-4: Mild disc bulge. Mild to moderate facet hypertrophy. Mild to moderate T3 foraminal stenosis, greater on the left. T4-5: Congenital incomplete segmentation. Otherwise negative. T5-6: Mild left eccentric disc bulge. Moderate to severe facet hypertrophy. Moderate to severe left and mild-to-moderate right T5 foraminal stenosis. T6-7: Mild disc bulge. Mild facet hypertrophy greater on the left. Mild to moderate left T6 foraminal stenosis. T7-8: Mild disc bulge. Moderate facet and ligament flavum hypertrophy. Mild spinal stenosis (series 7, image 20). No spinal cord mass effect. Mild to moderate right T7 foraminal stenosis. T8-9: Circumferential disc bulge with superimposed small central disc  protrusion best seen on series 8, image 23. Mild to moderate facet and ligament flavum hypertrophy. Mild spinal stenosis (series 7, image 23), mild if any ventral cord mass effect. Mild left T8 foraminal stenosis. T9-10: Circumferential disc bulge. Moderate to severe facet and ligament flavum hypertrophy. Mild spinal stenosis (series 7, image 25). No cord mass effect. Moderate to severe bilateral T9 foraminal stenosis. T10-11: Mild right eccentric disc bulge. Mild to moderate facet and ligament flavum hypertrophy. Somewhat dysplastic appearance of the right posterior elements, confirmed on the comparison CT and with posterior element ankylosis on the right (Series 4, image 4). Borderline to mild spinal stenosis. No spinal cord mass effect. Mild left T10 foraminal stenosis. T11-12: Anterior eccentric circumferential disc bulge. Moderate to severe facet and ligament flavum hypertrophy. Mild spinal stenosis (series 7, image 32). No spinal cord mass effect. Moderate to severe left T11 foraminal stenosis. Mild right T11 foraminal stenosis. T12-L1: Circumferential disc bulge with superimposed broad-based left paracentral disc protrusion best seen on series 7, image 35. Mild facet and ligament flavum hypertrophy. Mild spinal stenosis, with up to mild spinal cord mass effect (series 7, image 35). No foraminal stenosis. No thoracic spinal cord signal abnormality. Conus medullaris occurs below the L1-L2 level. IMPRESSION: 1. 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 Subsequent moderate to severe adjacent segment facet arthropathy. 2. Widespread thoracic spine disc bulging. Occasional small superimposed disc protrusions (T8-T9, T11-T12). 3. Subsequent multifactorial mild thoracic spinal stenosis T7-8, T8-9, T9-10, T10-11, T11-12, and T12-L1. Up to mild spinal cord mass effect at T8-9 and T12-L1, no thoracic spinal cord signal abnormality. 4. Multifactorial moderate  to severe neural foraminal stenosis at the right T5, bilateral T9 and left T11 nerve levels. 5. Lower cervical spine ACDF, new since 2010.

## 2015-07-02 NOTE — Progress Notes (Signed)
New patient here today for evaluation for upper back pain.  Patient is currently prescribed two different prescriptions of  Flexeril 10 mg, hydrocodone 5-325 mg and 10-325 mg given by Dr Marcy Salvo and Dr Doy Hutching.  Patient has had MRI and was told by Dr Marcy Salvo that he could not handle this type of pain in her back.   Safety precautions to be maintained throughout the outpatient stay will include: orient to surroundings, keep bed in low position, maintain call bell within reach at all times, provide assistance with transfer out of bed and ambulation.

## 2015-07-03 ENCOUNTER — Other Ambulatory Visit
Admission: RE | Admit: 2015-07-03 | Discharge: 2015-07-03 | Disposition: A | Discharge status: Home / Self Care | Payer: Medicare Other | Source: Ambulatory Visit | Attending: Pain Medicine | Admitting: Pain Medicine

## 2015-07-03 DIAGNOSIS — G8929 Other chronic pain: Secondary | ICD-10-CM | POA: Diagnosis present

## 2015-07-03 DIAGNOSIS — M8580 Other specified disorders of bone density and structure, unspecified site: Secondary | ICD-10-CM | POA: Diagnosis present

## 2015-07-03 LAB — MAGNESIUM: Magnesium: 1.5 mg/dL — ABNORMAL LOW (ref 1.7–2.4)

## 2015-07-03 LAB — C-REACTIVE PROTEIN: CRP: 1.9 mg/dL — AB (ref <1.0)

## 2015-07-03 LAB — SEDIMENTATION RATE: Sed Rate: 39 mm/hr — ABNORMAL HIGH (ref 0–30)

## 2015-07-04 LAB — VITAMIN D 25 HYDROXY (VIT D DEFICIENCY, FRACTURES): Vit D, 25-Hydroxy: 43.1 ng/mL (ref 30.0–100.0)

## 2015-07-05 LAB — COMPLIANCE DRUG ANALYSIS, UR: PDF: 0

## 2015-07-09 ENCOUNTER — Encounter: Payer: Self-pay | Admitting: Pain Medicine

## 2015-07-09 ENCOUNTER — Ambulatory Visit: Payer: Medicare Other | Attending: Pain Medicine | Admitting: Pain Medicine

## 2015-07-09 VITALS — BP 132/54 | HR 70 | Temp 97.9°F | Resp 13 | Ht 60.0 in | Wt 170.0 lb

## 2015-07-09 DIAGNOSIS — M4724 Other spondylosis with radiculopathy, thoracic region: Secondary | ICD-10-CM | POA: Diagnosis not present

## 2015-07-09 DIAGNOSIS — M8580 Other specified disorders of bone density and structure, unspecified site: Secondary | ICD-10-CM | POA: Diagnosis not present

## 2015-07-09 DIAGNOSIS — I10 Essential (primary) hypertension: Secondary | ICD-10-CM | POA: Insufficient documentation

## 2015-07-09 DIAGNOSIS — M549 Dorsalgia, unspecified: Secondary | ICD-10-CM | POA: Diagnosis present

## 2015-07-09 DIAGNOSIS — I471 Supraventricular tachycardia: Secondary | ICD-10-CM | POA: Insufficient documentation

## 2015-07-09 DIAGNOSIS — R05 Cough: Secondary | ICD-10-CM | POA: Diagnosis not present

## 2015-07-09 DIAGNOSIS — M4316 Spondylolisthesis, lumbar region: Secondary | ICD-10-CM | POA: Insufficient documentation

## 2015-07-09 DIAGNOSIS — K219 Gastro-esophageal reflux disease without esophagitis: Secondary | ICD-10-CM | POA: Diagnosis not present

## 2015-07-09 DIAGNOSIS — M5410 Radiculopathy, site unspecified: Secondary | ICD-10-CM | POA: Diagnosis not present

## 2015-07-09 DIAGNOSIS — M4804 Spinal stenosis, thoracic region: Secondary | ICD-10-CM | POA: Insufficient documentation

## 2015-07-09 DIAGNOSIS — Z79891 Long term (current) use of opiate analgesic: Secondary | ICD-10-CM | POA: Diagnosis not present

## 2015-07-09 DIAGNOSIS — M47894 Other spondylosis, thoracic region: Secondary | ICD-10-CM | POA: Insufficient documentation

## 2015-07-09 DIAGNOSIS — Z981 Arthrodesis status: Secondary | ICD-10-CM | POA: Insufficient documentation

## 2015-07-09 DIAGNOSIS — M5414 Radiculopathy, thoracic region: Secondary | ICD-10-CM

## 2015-07-09 DIAGNOSIS — G8929 Other chronic pain: Secondary | ICD-10-CM | POA: Diagnosis not present

## 2015-07-09 DIAGNOSIS — K5289 Other specified noninfective gastroenteritis and colitis: Secondary | ICD-10-CM | POA: Diagnosis not present

## 2015-07-09 DIAGNOSIS — E785 Hyperlipidemia, unspecified: Secondary | ICD-10-CM | POA: Diagnosis not present

## 2015-07-09 DIAGNOSIS — R0789 Other chest pain: Secondary | ICD-10-CM | POA: Diagnosis not present

## 2015-07-09 HISTORY — DX: Other spondylosis with radiculopathy, thoracic region: M47.24

## 2015-07-09 MED ORDER — SODIUM CHLORIDE 0.9 % IJ SOLN
INTRAMUSCULAR | Status: AC
  Administered 2015-07-09: 10:00:00
  Filled 2015-07-09: qty 10

## 2015-07-09 MED ORDER — DEXAMETHASONE SODIUM PHOSPHATE 10 MG/ML IJ SOLN
INTRAMUSCULAR | Status: AC
  Administered 2015-07-09: 10:00:00
  Filled 2015-07-09: qty 1

## 2015-07-09 MED ORDER — HYDROCODONE-ACETAMINOPHEN 5-325 MG PO TABS: 1.0000 | ORAL_TABLET | Freq: Four times a day (QID) | ORAL | Status: DC | PRN

## 2015-07-09 MED ORDER — DEXAMETHASONE SODIUM PHOSPHATE 10 MG/ML IJ SOLN: 10.0000 mg | Freq: Once | INTRAMUSCULAR | Status: DC

## 2015-07-09 MED ORDER — ROPIVACAINE HCL 2 MG/ML IJ SOLN
INTRAMUSCULAR | Status: AC
  Administered 2015-07-09: 10:00:00
  Filled 2015-07-09: qty 10

## 2015-07-09 MED ORDER — LIDOCAINE HCL (PF) 1 % IJ SOLN: 10.0000 mL | Freq: Once | INTRAMUSCULAR | Status: DC

## 2015-07-09 MED ORDER — CYCLOBENZAPRINE HCL 10 MG PO TABS: 10.0000 mg | ORAL_TABLET | Freq: Three times a day (TID) | ORAL | Status: DC | PRN

## 2015-07-09 MED ORDER — IOPAMIDOL (ISOVUE-M 200) INJECTION 41%: 5.0000 mL | Freq: Once | INTRAMUSCULAR | Status: DC | PRN

## 2015-07-09 MED ORDER — ROPIVACAINE HCL 2 MG/ML IJ SOLN: 1.0000 mL | Freq: Once | INTRAMUSCULAR | Status: DC

## 2015-07-09 MED ORDER — IOPAMIDOL (ISOVUE-M 200) INJECTION 41%
INTRAMUSCULAR | Status: AC
  Administered 2015-07-09: 10:00:00
  Filled 2015-07-09: qty 10

## 2015-07-09 MED ORDER — LIDOCAINE HCL (PF) 1 % IJ SOLN
INTRAMUSCULAR | Status: AC
  Administered 2015-07-09: 10:00:00
  Filled 2015-07-09: qty 5

## 2015-07-09 MED ORDER — SODIUM CHLORIDE 0.9% FLUSH: 1.0000 mL | Freq: Once | INTRAVENOUS | Status: DC

## 2015-07-09 NOTE — Patient Instructions (Addendum)
Pain Management Discharge Instructions  General Discharge Instructions :  If you need to reach your doctor call: Monday-Friday 8:00 am - 4:00 pm at [phone] or toll free [phone].  After clinic hours [phone] to have operator reach doctor.  Bring all of your medication bottles to all your appointments in the pain clinic.  To cancel or reschedule your appointment with Pain Management please remember to call 24 hours in advance to avoid a fee.  Refer to the educational materials which you have been given on: General Risks, I had my Procedure. Discharge Instructions, Post Sedation.  Post Procedure Instructions:  The drugs you were given will stay in your system until tomorrow, so for the next 24 hours you should not drive, make any legal decisions or drink any alcoholic beverages.  You may eat anything you prefer, but it is better to start with liquids then soups and crackers, and gradually work up to solid foods.  Please notify your doctor immediately if you have any unusual bleeding, trouble breathing or pain that is not related to your normal pain.  Depending on the type of procedure that was done, some parts of your body may feel week and/or numb.  This usually clears up by tonight or the next day.  Walk with the use of an assistive device or accompanied by an adult for the 24 hours.  You may use ice on the affected area for the first 24 hours.  Put ice in a Ziploc bag and cover with a towel and place against area 15 minutes on 15 minutes off.  You may switch to heat after 24 hours.Epidural Steroid Injection An epidural steroid injection is given to relieve pain in your neck, back, or legs that is caused by the irritation or swelling of a nerve root. This procedure involves injecting a steroid and numbing medicine (anesthetic) into the epidural space. The epidural space is the space between the outer covering of your spinal cord and the bones that form your backbone  (vertebra).  LET [facility] CARE PROVIDER KNOW ABOUT:  1. Any allergies you have. 2. All medicines you are taking, including vitamins, herbs, eye drops, creams, and over-the-counter medicines such as aspirin. 3. Previous problems you or members of your family have had with the use of anesthetics. 4. Any blood disorders or blood clotting disorders you have. 5. Previous surgeries you have had. 6. Medical conditions you have. RISKS AND COMPLICATIONS Generally, this is a safe procedure. However, as with any procedure, complications can occur. Possible complications of epidural steroid injection include: 1. Headache. 2. Bleeding. 3. Infection. 4. Allergic reaction to the medicines. 5. Damage to your nerves. The response to this procedure depends on the underlying cause of the pain and its duration. People who have long-term (chronic) pain are less likely to benefit from epidural steroids than are those people whose pain comes on strong and suddenly. BEFORE THE PROCEDURE  1. Ask your health care provider about changing or stopping your regular medicines. You may be advised to stop taking blood-thinning medicines a few days before the procedure. 2. You may be given medicines to reduce anxiety. 3. Arrange for someone to take you home after the procedure. PROCEDURE  1. You will remain awake during the procedure. You may receive medicine to make you relaxed. 2. You will be asked to lie on your stomach. 3. The injection site will be cleaned. 4. The injection site will be numbed with a medicine (local anesthetic). 5. A needle will be  injected through your skin into the epidural space. 6. Your health care provider will use an X-ray machine to ensure that the steroid is delivered closest to the affected nerve. You may have minimal discomfort at this time. 7. Once the needle is in the right position, the local anesthetic and the steroid will be injected into the epidural space. 8. The needle will then  be removed and a bandage will be applied to the injection site. AFTER THE PROCEDURE   You may be monitored for a short time before you go home.  You may feel weakness or numbness in your arm or leg, which disappears within hours.  You may be allowed to eat, drink, and take your regular medicine.  You may have soreness at the site of the injection.   This information is not intended to replace advice given to you by your health care provider. Make sure you discuss any questions you have with your health care provider.   Document Released: 06/30/2007 Document Revised: 11/23/2012 Document Reviewed: 09/09/2012 Elsevier Interactive Patient Education 2016 [place]  What are the risk, side effects and possible complications? Generally speaking, most procedures are safe.  However, with any procedure there are risks, side effects, and the possibility of complications.  The risks and complications are dependent upon the sites that are lesioned, or the type of nerve block to be performed.  The closer the procedure is to the spine, the more serious the risks are.  Great care is taken when placing the radio frequency needles, block needles or lesioning probes, but sometimes complications can occur. 1. Infection: Any time there is an injection through the skin, there is a risk of infection.  This is why sterile conditions are used for these blocks.  There are four possible types of infection. 1. Localized skin infection. 2. Central Nervous System Infection-This can be in the form of Meningitis, which can be deadly. 3. Epidural Infections-This can be in the form of an epidural abscess, which can cause pressure inside of the spine, causing compression of the spinal cord with subsequent paralysis. This would require an emergency surgery to decompress, and there are no guarantees that the patient would recover from the paralysis. 4. Discitis-This is an infection of the  intervertebral discs.  It occurs in about 1% of discography procedures.  It is difficult to treat and it may lead to surgery.        2. Pain: the needles have to go through skin and soft tissues, will cause soreness.       3. Damage to internal structures:  The nerves to be lesioned may be near blood vessels or    other nerves which can be potentially damaged.       4. Bleeding: Bleeding is more common if the patient is taking blood thinners such as  aspirin, Coumadin, Ticiid, Plavix, etc., or if he/she have some genetic predisposition  such as hemophilia. Bleeding into the spinal canal can cause compression of the spinal  cord with subsequent paralysis.  This would require an emergency surgery to  decompress and there are no guarantees that the patient would recover from the  paralysis.       5. Pneumothorax:  Puncturing of a lung is a possibility, every time a needle is introduced in  the area of the chest or upper back.  Pneumothorax refers to free air around the  collapsed lung(s), inside of the thoracic cavity (chest cavity).  Another two  possible  complications related to a similar event would include: Hemothorax and Chylothorax.   These are variations of the Pneumothorax, where instead of air around the collapsed  lung(s), you may have blood or chyle, respectively.       6. Spinal headaches: They may occur with any procedures in the area of the spine.       7. Persistent CSF (Cerebro-Spinal Fluid) leakage: This is a rare problem, but may occur  with prolonged intrathecal or epidural catheters either due to the formation of a fistulous  track or a dural tear.       8. Nerve damage: By working so close to the spinal cord, there is always a possibility of  nerve damage, which could be as serious as a permanent spinal cord injury with  paralysis.       9. Death:  Although rare, severe deadly allergic reactions known as "Anaphylactic  reaction" can occur to any of the medications used.       10. Worsening of the symptoms:  We can always make thing worse.  What are the chances of something like this happening? Chances of any of this occuring are extremely low.  By statistics, you have more of a chance of getting killed in a motor vehicle accident: while driving to the hospital than any of the above occurring .  Nevertheless, you should be aware that they are possibilities.  In general, it is similar to taking a shower.  Everybody knows that you can slip, hit your head and get killed.  Does that mean that you should not shower again?  Nevertheless always keep in mind that statistics do not mean anything if you happen to be on the wrong side of them.  Even if a procedure has a 1 (one) in a 1,000,000 (million) chance of going wrong, it you happen to be that one..Also, keep in mind that by statistics, you have more of a chance of having something go wrong when taking medications.  Who should not have this procedure? If you are on a blood thinning medication (e.g. Coumadin, Plavix, see list of "Blood Thinners"), or if you have an active infection going on, you should not have the procedure.  If you are taking any blood thinners, please inform your physician.  How should I prepare for this procedure?  Do not eat or drink anything at least six hours prior to the procedure.  Bring a driver with you .  It cannot be a taxi.  Come accompanied by an adult that can drive you back, and that is strong enough to help you if your legs get weak or numb from the local anesthetic.  Take all of your medicines the morning of the procedure with just enough water to swallow them.  If you have diabetes, make sure that you are scheduled to have your procedure done first thing in the morning, whenever possible.  If you have diabetes, take only half of your insulin dose and notify our nurse that you have done so as soon as you arrive at the clinic.  If you are diabetic, but only take blood sugar pills (oral  hypoglycemic), then do not take them on the morning of your procedure.  You may take them after you have had the procedure.  Do not take aspirin or any aspirin-containing medications, at least eleven (11) days prior to the procedure.  They may prolong bleeding.  Wear loose fitting clothing that may be easy to take off  and that you would not mind if it got stained with Betadine or blood.  Do not wear any jewelry or perfume  Remove any nail coloring.  It will interfere with some of our monitoring equipment.  NOTE: Remember that this is not meant to be interpreted as a complete list of all possible complications.  Unforeseen problems may occur.  BLOOD THINNERS The following drugs contain aspirin or other products, which can cause increased bleeding during surgery and should not be taken for 2 weeks prior to and 1 week after surgery.  If you should need take something for relief of minor pain, you may take acetaminophen which is found in Tylenol,m Datril, Anacin-3 and Panadol. It is not blood thinner. The products listed below are.  Do not take any of the products listed below in addition to any listed on your instruction sheet.  A.P.C or A.P.C with Codeine Codeine Phosphate Capsules #3 Ibuprofen Ridaura  ABC compound Congesprin Imuran rimadil  Advil Cope Indocin Robaxisal  Alka-Seltzer Effervescent Pain Reliever and Antacid Coricidin or Coricidin-D  Indomethacin Rufen  Alka-Seltzer plus Cold Medicine Cosprin Ketoprofen S-A-C Tablets  Anacin Analgesic Tablets or Capsules Coumadin Korlgesic Salflex  Anacin Extra Strength Analgesic tablets or capsules CP-2 Tablets Lanoril Salicylate  Anaprox Cuprimine Capsules Levenox Salocol  Anexsia-D Dalteparin Magan Salsalate  Anodynos Darvon compound Magnesium Salicylate Sine-off  Ansaid Dasin Capsules Magsal Sodium Salicylate  Anturane Depen Capsules Marnal Soma  APF Arthritis pain formula Dewitt's Pills Measurin Stanback  Argesic Dia-Gesic Meclofenamic  Sulfinpyrazone  Arthritis Bayer Timed Release Aspirin Diclofenac Meclomen Sulindac  Arthritis pain formula Anacin Dicumarol Medipren Supac  Analgesic (Safety coated) Arthralgen Diffunasal Mefanamic Suprofen  Arthritis Strength Bufferin Dihydrocodeine Mepro Compound Suprol  Arthropan liquid Dopirydamole Methcarbomol with Aspirin Synalgos  ASA tablets/Enseals Disalcid Micrainin Tagament  Ascriptin Doan's Midol Talwin  Ascriptin A/D Dolene Mobidin Tanderil  Ascriptin Extra Strength Dolobid Moblgesic Ticlid  Ascriptin with Codeine Doloprin or Doloprin with Codeine Momentum Tolectin  Asperbuf Duoprin Mono-gesic Trendar  Aspergum Duradyne Motrin or Motrin IB Triminicin  Aspirin plain, buffered or enteric coated Durasal Myochrisine Trigesic  Aspirin Suppositories Easprin Nalfon Trillsate  Aspirin with Codeine Ecotrin Regular or Extra Strength Naprosyn Uracel  Atromid-S Efficin Naproxen Ursinus  Auranofin Capsules Elmiron Neocylate Vanquish  Axotal Emagrin Norgesic Verin  Azathioprine Empirin or Empirin with Codeine Normiflo Vitamin E  Azolid Emprazil Nuprin Voltaren  Bayer Aspirin plain, buffered or children's or timed BC Tablets or powders Encaprin Orgaran Warfarin Sodium  Buff-a-Comp Enoxaparin Orudis Zorpin  Buff-a-Comp with Codeine Equegesic Os-Cal-Gesic   Buffaprin Excedrin plain, buffered or Extra Strength Oxalid   Bufferin Arthritis Strength Feldene Oxphenbutazone   Bufferin plain or Extra Strength Feldene Capsules Oxycodone with Aspirin   Bufferin with Codeine Fenoprofen Fenoprofen Pabalate or Pabalate-SF   Buffets II Flogesic Panagesic   Buffinol plain or Extra Strength Florinal or Florinal with Codeine Panwarfarin   Buf-Tabs Flurbiprofen Penicillamine   Butalbital Compound Four-way cold tablets Penicillin   Butazolidin Fragmin Pepto-Bismol   Carbenicillin Geminisyn Percodan   Carna Arthritis Reliever Geopen Persantine   Carprofen Gold's salt Persistin   Chloramphenicol Goody's  Phenylbutazone   Chloromycetin Haltrain Piroxlcam   Clmetidine heparin Plaquenil   Cllnoril Hyco-pap Ponstel   Clofibrate Hydroxy chloroquine Propoxyphen         Before stopping any of these medications, be sure to consult the physician who ordered them.  Some, such as Coumadin (Warfarin) are ordered to prevent or treat serious conditions such as "deep thrombosis", "pumonary embolisms", and  other heart problems.  The amount of time that you may need off of the medication may also vary with the medication and the reason for which you were taking it.  If you are taking any of these medications, please make sure you notify your pain physician before you undergo any procedures.         Epidural Steroid Injection Patient Information  Description: The epidural space surrounds the nerves as they exit the spinal cord.  In some patients, the nerves can be compressed and inflamed by a bulging disc or a tight spinal canal (spinal stenosis).  By injecting steroids into the epidural space, we can bring irritated nerves into direct contact with a potentially helpful medication.  These steroids act directly on the irritated nerves and can reduce swelling and inflammation which often leads to decreased pain.  Epidural steroids may be injected anywhere along the spine and from the neck to the low back depending upon the location of your pain.   After numbing the skin with local anesthetic (like Novocaine), a small needle is passed into the epidural space slowly.  You may experience a sensation of pressure while this is being done.  The entire block usually last less than 10 minutes.  Conditions which may be treated by epidural steroids:   Low back and leg pain  Neck and arm pain  Spinal stenosis  Post-laminectomy syndrome  Herpes zoster (shingles) pain  Pain from compression fractures  Preparation for the injection:  6. Do not eat any solid food or dairy products within 8 hours of your  appointment.  7. You may drink clear liquids up to 3 hours before appointment.  Clear liquids include water, black coffee, juice or soda.  No milk or cream please. 8. You may take your regular medication, including pain medications, with a sip of water before your appointment  Diabetics should hold regular insulin (if taken separately) and take 1/2 normal NPH dos the morning of the procedure.  Carry some sugar containing items with you to your appointment. 9. A driver must accompany you and be prepared to drive you home after your procedure.  10. Bring all your current medications with your. 11. An IV may be inserted and sedation may be given at the discretion of the physician.   12. A blood pressure cuff, EKG and other monitors will often be applied during the procedure.  Some patients may need to have extra oxygen administered for a short period. 51. You will be asked to provide medical information, including your allergies, prior to the procedure.  We must know immediately if you are taking blood thinners (like Coumadin/Warfarin)  Or if you are allergic to IV iodine contrast (dye). We must know if you could possible be pregnant.  Possible side-effects:  Bleeding from needle site  Infection (rare, may require surgery)  Nerve injury (rare)  Numbness & tingling (temporary)  Difficulty urinating (rare, temporary)  Spinal headache ( a headache worse with upright posture)  Light -headedness (temporary)  Pain at injection site (several days)  Decreased blood pressure (temporary)  Weakness in arm/leg (temporary)  Pressure sensation in back/neck (temporary)  Call if you experience:  Fever/chills associated with headache or increased back/neck pain.  Headache worsened by an upright position.  New onset weakness or numbness of an extremity below the injection site  Hives or difficulty breathing (go to the emergency room)  Inflammation or drainage at the infection site  Severe  back/neck pain  Any new symptoms  which are concerning to you  Please note:  Although the local anesthetic injected can often make your back or neck feel good for several hours after the injection, the pain will likely return.  It takes 3-7 days for steroids to work in the epidural space.  You may not notice any pain relief for at least that one week.  If effective, we will often do a series of three injections spaced 3-6 weeks apart to maximally decrease your pain.  After the initial series, we generally will wait several months before considering a repeat injection of the same type.  If you have any questions, please call [phone] [facility] Dis[facility]  General Discharge Instructions :  If you need to reach your doctor call: Monday-Friday 8:00 am - 4:00 pm at [phone] or toll free [phone].  After clinic hours [phone] to have operator reach doctor.  Bring all of your medication bottles to all your appointments in the pain clinic.  To cancel or reschedule your appointment with Pain Management please remember to call 24 hours in advance to avoid a fee.  Refer to the educational materials which you have been given on: General Risks, I had my Procedure. Discharge Instructions, Post Sedation.  Post Procedure Instructions:  The drugs you were given will stay in your system until tomorrow, so for the next 24 hours you should not drive, make any legal decisions or drink any alcoholic beverages.  You may eat anything you prefer, but it is better to start with liquids then soups and crackers, and gradually work up to solid foods.  Please notify your doctor immediately if you have any unusual bleeding, trouble breathing or pain that is not related to your normal pain.  Depending on the type of procedure that was done, some parts of your body may feel week and/or numb.  This usually clears up by tonight or the next  day.  Walk with the use of an assistive device or accompanied by an adult for the 24 hours.  You may use ice on the affected area for the first 24 hours.  Put ice in a Ziploc bag and cover with a towel and place against area 15 minutes on 15 minutes off.  You may switch to heat after 24 hours.

## 2015-07-09 NOTE — Progress Notes (Signed)
Patient's Name: Allison Mclean MRN: [id number] DOB: 1935-08-21 DOS: 07/09/2015  Primary Reason(s) for Visit: Interventional Pain Management Treatment. CC: Back Pain   Procedure:  Anesthesia, Analgesia, Anxiolysis:  Type: Therapeutic Inter-Laminar Thoracic Epidural Block Region: Posterior Thoracolumbar Level: T5-6 Laterality: Left Paramedial  Indications: 1. Chronic thoracic radicular pain (T5/T6) (Left)   2. Thoracic foraminal stenosis (Moderate to severe at: Right T5; Bilateral T9; Left T11)   3. Thoracic spondylosis with radiculopathy   4. Chronic pain     Pre-procedure Pain Score: 2/10  Reported level of pain is compatible with clinical observations Post-procedure Pain Score: 2   Type: Local Anesthesia Local Anesthetic: Lidocaine 1% Route: Infiltration ([place]/IM) IV Access: Declined Sedation: Declined  Indication(s): Analgesia     Pre-Procedure Assessment:  Allison Mclean is a 80 y.o. year old, female patient, seen today for interventional treatment. She has Acquired spondylolisthesis; Essential (primary) hypertension; Acid reflux; HLD (hyperlipidemia); Adaptive colitis; Supraventricular tachycardia ([place]); Cough; Atypical chest pain; Chronic pain; Long term current use of opiate analgesic; Long term prescription opiate use; Opiate use; Encounter for therapeutic drug level monitoring; Encounter for pain management planning; Osteopenia, senile; Abnormal MRI, thoracic spine; Thoracic central spinal stenosis (T7-8, T8-9, T9-10, T10-11, T11-12, and T12-L1); Thoracic foraminal stenosis (Moderate to severe at: Right T5; Bilateral T9; Left T11); Chronic thoracic radicular pain (T5/T6) (Left); History of lumbar fusion (12/19/2013) (posterior lumbar fusion from L2-L5); Failed back surgical syndrome; Thoracic facet syndrome (Left); History of fusion of cervical spine; and Thoracic spondylosis with radiculopathy (Left) on her problem list.. Her primarily concern today is the Back Pain   Pain Type:  Chronic pain Pain Location: Back Pain Orientation: Mid Pain Descriptors / Indicators: Aching Pain Frequency: Constant  Date of Last Visit: 07/02/15 Service Provided on Last Visit: Evaluation  Verification of the correct person, correct site (including marking of site), and correct procedure were performed and confirmed by the patient.  Today's Vitals   07/09/15 0932 07/09/15 0937 07/09/15 0941 07/09/15 0943  BP: 109/47 112/44  132/54  Pulse: 67 72  70  Temp:      Resp: '12 14  13  ' Height:      Weight:      SpO2:  86% 90% 89%  PainSc:      PainLoc:      Calculated BMI: Body mass index is 33.2 kg/(m^2). Allergies: She is allergic to atorvastatin and rosuvastatin.. Primary Diagnosis: Radicular pain of thoracic region [M54.10]  Consent: Secured. Under the influence of no sedatives a written informed consent was obtained, after having provided information on the risks and possible complications. To fulfill our ethical and legal obligations, as recommended by the American Medical Association's Code of Ethics, we have provided information to the patient about our clinical impression; the nature and purpose of the treatment or procedure; the risks, benefits, and possible complications of the intervention; alternatives; the risk(s) and benefit(s) of the alternative treatment(s) or procedure(s); and the risk(s) and benefit(s) of doing nothing. The patient was provided information about the risks and possible complications associated with the procedure. These include, but are not limited to, failure to achieve desired goals, infection, bleeding, organ or nerve damage, allergic reactions, paralysis, and death. In the case of spinal procedures these may include, but are not limited to, failure to achieve desired goals, infection, bleeding, organ or nerve damage, allergic reactions, paralysis, and death. In addition, the patient was informed that Medicine is not an exact science; therefore, there is also  the possibility of unforeseen risks and  possible complications that may result in a catastrophic outcome. The patient indicated having understood very clearly. We have given the patient no guarantees and we have made no promises. Enough time was given to the patient to ask questions, all of which were answered to the patient's satisfaction.  Pre-Procedure Preparation: Safety Precautions: Allergies reviewed. Appropriate site, procedure, and patient were confirmed by following the Joint Commission's Universal Protocol (UP.01.01.01), in the form of a "Time Out". The patient was asked to confirm marked site and procedure, before commencing. The patient was asked about blood thinners, or active infections, both of which were denied. Patient was assessed for positional comfort and all pressure points were checked before starting procedure. Monitoring:  As per clinic protocol. Infection Control Precautions: Sterile technique used. Standard Universal Precautions were taken as recommended by the Department of [facility] for Disease Control and Prevention (CDC). Standard pre-surgical skin prep was conducted. Respiratory hygiene and cough etiquette was practiced. Hand hygiene observed. Safe injection practices and needle disposal techniques followed. SDV (single dose vial) medications used. Medications properly checked for expiration dates and contaminants. Personal protective equipment (PPE) used: Surgical mask. Sterile double glove technique. Radiation resistant gloves. Sterile surgical gloves.  Description of Procedure Process:  Time-out: "Time-out" completed before starting procedure, as per protocol. Position: Prone Target Area: For Epidural Steroid injection(s), the target area is the  interlaminar space, initially targeting the lower border of the superior vertebral body lamina. Approach: Interlaminar approach. Area Prepped: Entire Posterior Thoracolumbar Region Prepping solution: Duraprep  (Iodine Povacrylex [0.7% available Iodine] and Isopropyl Alcohol, 74% w/w) Safety Precautions: Aspiration looking for blood return was conducted prior to all injections. At no point did we inject any substances, as a needle was being advanced. No attempts were made at seeking any paresthesias. Safe injection practices and needle disposal techniques used. Medications properly checked for expiration dates. SDV (single dose vial) medications used.   Description of the Procedure: Protocol guidelines were followed. The patient was placed in position over the fluoroscopy table. The target area was identified and the area prepped in the usual manner. Skin & deeper tissues infiltrated with local anesthetic. Appropriate amount of time allowed to pass for local anesthetics to take effect. The procedure needles were then advanced to the target area. The inferior aspect of the superior lamina was contacted and the needle walked caudad, until the lamina was cleared. The epidural space was identified using "loss-of-resistance technique" with 0.9% PF-NSS (2-14m), in a low friction 10cc LOR glass syringe. Proper needle placement was secured. Negative aspiration confirmed. Solution injected in intermittent fashion, asking for systemic symptoms every 0.5 cc of injectate. The needles were then removed and the area cleansed, making sure to leave some of the prepping solution behind to take advantage of its long term bactericidal properties. EBL: None Materials & Medications Used:  Needle(s) Used: 20g - 10cm, Tuohy-style epidural needle Medication(s): Please see chart orders for medication and dosing details.  Imaging Guidance:   Type of Imaging Technique: Fluoroscopy Guidance (Spinal) Indication(s): Assistance in needle guidance and placement for procedures requiring needle placement in or near specific anatomical locations not easily accessible without such assistance. Exposure Time: Please see nurses notes. Contrast:  Before injecting any contrast, we confirmed that the patient did not have an allergy to iodine, shellfish, or radiological contrast. Once satisfactory needle placement was completed at the desired level, radiological contrast was injected. Injection was conducted under continuous fluoroscopic guidance. Injection of contrast accomplished without complications. See chart for type and  volume of contrast used. Fluoroscopic Guidance: I was personally present in the fluoroscopy suite, where the patient was placed in position for the procedure, over the fluoroscopy-compatible table. Fluoroscopy was manipulated, using "Tunnel Vision Technique", to obtain the best possible view of the target area, on the affected side. Parallax error was corrected before commencing the procedure. A "direction-depth-direction" technique was used to introduce the needle under continuous pulsed fluoroscopic guidance. Once the target was reached, antero-posterior, oblique, and lateral fluoroscopic projection views were taken to confirm needle placement in all planes. Permanently recorded images stored by scanning into EMR. Interpretation: Intraoperative imaging interpretation by performing Physician. Adequate needle placement confirmed. Adequate needle placement confirmed in AP, lateral, & Oblique Views. Appropriate spread of contrast to desired area. No evidence of afferent or efferent intravascular uptake. No intrathecal or subarachnoid spread observed. Permanent hardcopy images in multiple planes scanned into the patient's record.  Antibiotic Prophylaxis:  Indication(s): No indications identified. Type:  Antibiotics Given (last 72 hours)    None       Post-operative Assessment:   Complications: No immediate post-treatment complications were observed. Disposition: Return to clinic for follow-up evaluation. The patient tolerated the entire procedure well. A repeat set of vitals were taken after the procedure and the patient was  kept under observation following institutional policy, for this type of procedure. Post-procedural neurological assessment was performed, showing return to baseline, prior to discharge. The patient was discharged home, once institutional criteria were met. The patient was provided with post-procedure discharge instructions, including a section on how to identify potential problems. Should any problems arise concerning this procedure, the patient was given instructions to immediately contact us, at any time, without hesitation. In any case, we plan to contact the patient by telephone for a follow-up status report regarding this interventional procedure. Comments:  No additional relevant information.  Medications administered during this visit: We administered ropivacaine (PF) 2 mg/ml (0.2%), iopamidol, lidocaine (PF), sodium chloride, and dexamethasone.  Prescriptions ordered during this visit: New Prescriptions   No medications on file    Future Appointments Date Time Provider [place]  07/25/2015 2:40 PM Milinda Pointer, MD [facility] None    Primary Care Physician: Idelle Crouch, MD Location: [facility] Note by: Kathlen Brunswick. Dossie Arbour, M.D, DABA, DABAPM, DABPM, DABIPP, FIPP  Disclaimer:  Medicine is not an exact science. The only guarantee in medicine is that nothing is guaranteed. It is important to note that the decision to proceed with this intervention was based on the information collected from the patient. The Data and conclusions were drawn from the patient's questionnaire, the interview, and the physical examination. Because the information was provided in large part by the patient, it cannot be guaranteed that it has not been purposely or unconsciously manipulated. Every effort has been made to obtain as much relevant data as possible for this evaluation. It is important to note that the conclusions that lead to this procedure are derived in  large part from the available data. Always take into account that the treatment will also be dependent on availability of resources and existing treatment guidelines, considered by other Pain Management Practitioners as being common knowledge and practice, at the time of the intervention. For Medico-Legal purposes, it is also important to point out that variation in procedural techniques and pharmacological choices are the acceptable norm. The indications, contraindications, technique, and results of the above procedure should only be interpreted and judged by a Board-Certified Interventional Pain Specialist with extensive familiarity and expertise  in the same exact procedure and technique. Attempts at providing opinions without similar or greater experience and expertise than that of the treating physician will be considered as inappropriate and unethical, and shall result in a formal complaint to the state medical board and applicable specialty societies.

## 2015-07-09 NOTE — Progress Notes (Signed)
Safety precautions to be maintained throughout the outpatient stay will include: orient to surroundings, keep bed in low position, maintain call bell within reach at all times, provide assistance with transfer out of bed and ambulation.  

## 2015-07-10 ENCOUNTER — Encounter: Payer: Self-pay | Admitting: Pain Medicine

## 2015-07-10 ENCOUNTER — Other Ambulatory Visit: Payer: Self-pay | Admitting: Pain Medicine

## 2015-07-10 ENCOUNTER — Telehealth: Payer: Self-pay | Admitting: *Deleted

## 2015-07-10 MED ORDER — MAGNESIUM OXIDE -MG SUPPLEMENT 500 MG PO CAPS: 1.0000 | ORAL_CAPSULE | Freq: Two times a day (BID) | ORAL | Status: DC

## 2015-07-10 NOTE — Progress Notes (Signed)
Quick Note:   A normal sedimentation rate should be below 30 mm/hr. The sed rate is an acute phase reactant that indirectly measures the degree of inflammation present in the body. It can be acute, developing rapidly after trauma, injury or infection, for example, or can occur over an extended time (chronic) with conditions such as autoimmune diseases or cancer. The ESR is not diagnostic; it is a non-specific, screening test that may be elevated in a number of these different conditions. It provides general information about the presence or absence of an inflammatory condition.  The combined elevation of the ESR & CRP, may be suggestive of an autoimmune disease. Should this be the case, we will inquire if the patient has had a rheumatologic evaluation looking at the RF levels, ANA levels, and CBC.  ______ 

## 2015-07-10 NOTE — Progress Notes (Signed)
Quick Note:  Normal Magnesium levels are between 1.6 and 2.3 mEq/L. Low magnesium blood level can lead to low calcium and potassium levels. Low levels may indicate inadequate dietary consuming, poor absorbtion, or excessive excretion. Signs and symptoms may include: leg cramps, foot pain, muscle twitches, loss of appetite, nausea, vomiting, fatigue, weakness, numbness, tingling, seizures, personality changes, abnormal heart rhythms, and/or coronary artery spasms.  ______ 

## 2015-07-10 NOTE — Telephone Encounter (Signed)
No problems post procedure. 

## 2015-07-10 NOTE — Progress Notes (Signed)

## 2015-07-25 ENCOUNTER — Ambulatory Visit: Payer: Medicare Other | Admitting: Pain Medicine

## 2015-08-05 ENCOUNTER — Encounter: Payer: Self-pay | Admitting: Pain Medicine

## 2015-08-05 ENCOUNTER — Ambulatory Visit: Payer: Medicare Other | Attending: Pain Medicine | Admitting: Pain Medicine

## 2015-08-05 VITALS — BP 122/78 | HR 66 | Temp 98.3°F | Resp 16 | Ht 63.0 in | Wt 170.0 lb

## 2015-08-05 DIAGNOSIS — I471 Supraventricular tachycardia: Secondary | ICD-10-CM | POA: Diagnosis not present

## 2015-08-05 DIAGNOSIS — Z9889 Other specified postprocedural states: Secondary | ICD-10-CM | POA: Diagnosis not present

## 2015-08-05 DIAGNOSIS — M4316 Spondylolisthesis, lumbar region: Secondary | ICD-10-CM | POA: Diagnosis not present

## 2015-08-05 DIAGNOSIS — M8580 Other specified disorders of bone density and structure, unspecified site: Secondary | ICD-10-CM | POA: Insufficient documentation

## 2015-08-05 DIAGNOSIS — M549 Dorsalgia, unspecified: Secondary | ICD-10-CM | POA: Diagnosis present

## 2015-08-05 DIAGNOSIS — Z5181 Encounter for therapeutic drug level monitoring: Secondary | ICD-10-CM | POA: Diagnosis not present

## 2015-08-05 DIAGNOSIS — I10 Essential (primary) hypertension: Secondary | ICD-10-CM | POA: Insufficient documentation

## 2015-08-05 DIAGNOSIS — M5414 Radiculopathy, thoracic region: Secondary | ICD-10-CM

## 2015-08-05 DIAGNOSIS — M791 Myalgia: Secondary | ICD-10-CM | POA: Diagnosis not present

## 2015-08-05 DIAGNOSIS — K589 Irritable bowel syndrome without diarrhea: Secondary | ICD-10-CM | POA: Insufficient documentation

## 2015-08-05 DIAGNOSIS — M4804 Spinal stenosis, thoracic region: Secondary | ICD-10-CM | POA: Diagnosis not present

## 2015-08-05 DIAGNOSIS — M546 Pain in thoracic spine: Secondary | ICD-10-CM | POA: Insufficient documentation

## 2015-08-05 DIAGNOSIS — R0789 Other chest pain: Secondary | ICD-10-CM | POA: Diagnosis not present

## 2015-08-05 DIAGNOSIS — K219 Gastro-esophageal reflux disease without esophagitis: Secondary | ICD-10-CM | POA: Insufficient documentation

## 2015-08-05 DIAGNOSIS — M792 Neuralgia and neuritis, unspecified: Secondary | ICD-10-CM

## 2015-08-05 DIAGNOSIS — R05 Cough: Secondary | ICD-10-CM | POA: Insufficient documentation

## 2015-08-05 DIAGNOSIS — E785 Hyperlipidemia, unspecified: Secondary | ICD-10-CM | POA: Diagnosis not present

## 2015-08-05 DIAGNOSIS — K5289 Other specified noninfective gastroenteritis and colitis: Secondary | ICD-10-CM | POA: Insufficient documentation

## 2015-08-05 DIAGNOSIS — G8929 Other chronic pain: Secondary | ICD-10-CM | POA: Diagnosis not present

## 2015-08-05 DIAGNOSIS — M7918 Myalgia, other site: Secondary | ICD-10-CM

## 2015-08-05 DIAGNOSIS — Z79891 Long term (current) use of opiate analgesic: Secondary | ICD-10-CM

## 2015-08-05 DIAGNOSIS — M5410 Radiculopathy, site unspecified: Secondary | ICD-10-CM

## 2015-08-05 DIAGNOSIS — R42 Dizziness and giddiness: Secondary | ICD-10-CM | POA: Insufficient documentation

## 2015-08-05 DIAGNOSIS — Z981 Arthrodesis status: Secondary | ICD-10-CM | POA: Diagnosis not present

## 2015-08-05 DIAGNOSIS — R079 Chest pain, unspecified: Secondary | ICD-10-CM | POA: Diagnosis present

## 2015-08-05 MED ORDER — GABAPENTIN 100 MG PO CAPS: 100.0000 mg | ORAL_CAPSULE | Freq: Every day | ORAL | Status: DC

## 2015-08-05 MED ORDER — CYCLOBENZAPRINE HCL 10 MG PO TABS: 10.0000 mg | ORAL_TABLET | Freq: Three times a day (TID) | ORAL | Status: DC | PRN

## 2015-08-05 MED ORDER — HYDROCODONE-ACETAMINOPHEN 5-325 MG PO TABS: 1.0000 | ORAL_TABLET | Freq: Two times a day (BID) | ORAL | Status: DC | PRN

## 2015-08-05 NOTE — Progress Notes (Signed)
Safety precautions to be maintained throughout the outpatient stay will include: orient to surroundings, keep bed in low position, maintain call bell within reach at all times, provide assistance with transfer out of bed and ambulation. Pill count is 51/120 tablets of hydrocodone apap 5-325 mg filled on 07/09/2015.

## 2015-08-05 NOTE — Progress Notes (Signed)
Patient's Name: Allison Mclean  Patient type: Established  MRN: [id number]  Service setting: Ambulatory outpatient  DOB: 1935-09-14  Location: [facility]  DOS: 08/05/2015  Primary Care Physician: Idelle Crouch, MD  Note by: Kathlen Brunswick. Dossie Arbour, M.D, DABA, DABAPM, DABPM, DABIPP, [place]  Referring Physician: Idelle Crouch, MD  Specialty: Board-Certified Interventional Pain Management  Last Visit to Pain Management: 07/10/2015   Primary Reason(s) for Visit: Encounter for post-procedure evaluation of chronic illness with mild to moderate exacerbation, and pharmacological medication management. CC: Back Pain and Chest Pain   HPI  Ms. Herberg is a 80 y.o. year old, female patient, who returns today as an established patient. She has Acquired spondylolisthesis; Essential (primary) hypertension; Acid reflux; HLD (hyperlipidemia); Adaptive colitis; Supraventricular tachycardia ([place]); Cough; Atypical chest pain; Chronic pain; Long term current use of opiate analgesic; Long term prescription opiate use; Opiate use; Encounter for therapeutic drug level monitoring; Encounter for pain management planning; Osteopenia, senile; Abnormal MRI, thoracic spine; Thoracic central spinal stenosis (T7-8, T8-9, T9-10, T10-11, T11-12, and T12-L1); Thoracic foraminal stenosis (Moderate to severe at: Right T5; Bilateral T9; Left T11); Chronic thoracic radicular pain (T5/T6) (Left); History of lumbar fusion (12/19/2013) (posterior lumbar fusion from L2-L5); Failed back surgical syndrome; Thoracic facet syndrome (Left); History of fusion of cervical spine; Thoracic spondylosis with radiculopathy (Left); Hypomagnesemia; BP (high blood pressure); Postoperative back pain; Nerve root pain; Spinal stenosis of thoracic region; Status post lumbar spine operation; Musculoskeletal pain; and Neuropathic pain on her problem list.. Her primarily concern today is the Back Pain and Chest Pain   Pain  Assessment: Self-Reported Pain Score: 0-No pain Reported level is compatible with observation Pain Type: Chronic pain Pain Location: Back Pain Orientation: Mid, Upper Pain Descriptors / Indicators: Aching, Constant, Radiating (more pain when sitting up straight and/or riding  in the car) Pain Frequency: Constant  The patient returns to the clinic after having had a left-sided T5-6 thoracic epidural steroid injection on under fluoroscopic guidance without sedation on 07/09/2015. During today's visit, the patient had an episode of dizziness, without hypoglycemia or any other clear explanation. She indicates that she has been having more of these lately and I am concerned of the possibility of TIAs. I have recommended that she see her primary care physician for a workup.  Date of Last Visit: 07/09/15 Service Provided on Last Visit: Procedure (thoracic epidural)  Controlled Substance Pharmacotherapy Assessment & REMS (Risk Evaluation and Mitigation Strategy)  Analgesic: Hydrocodone/APAP 5/325 one every 6 hours when necessary (20 mg/day) Pill Count: Pill count is 51/120 tablets of hydrocodone apap 5-325 mg filled on 07/09/2015 (she is averaging about 2 per day) MME/day: 20 mg/day.  Pharmacokinetics: Onset of action (Liberation/Absorption): Within expected pharmacological parameters Time to Peak effect (Distribution): Timing and results are as within normal expected parameters Duration of action (Metabolism/Excretion): Within normal limits for medication Pharmacodynamics: Analgesic Effect: More than 50% Activity Facilitation: Medication(s) allow patient to sit, stand, walk, and do the basic ADLs Perceived Effectiveness: Described as relatively effective, allowing for increase in activities of daily living (ADL) Side-effects or Adverse reactions: None reported Monitoring: [place] PMP: Online review of the past 74-month period conducted. Compliant with practice rules and regulations UDS  Results/interpretation: The patient's last UDS was done on 07/02/2015 and it came back within normal limits. The only unexpected results were those of acetaminophen and salicylates, both of which were not present on the sample even though the patient had indicated that she was taking some aspirin and Tylenol.  Medication Assessment Form: Reviewed. Patient indicates being compliant with therapy Treatment compliance: Compliant Risk Assessment: Aberrant Behavior: None observed today Substance Use Disorder (SUD) Risk Level: Low Risk of opioid abuse or dependence: 0.7-3.0% with doses ? 36 MME/day and 6.1-26% with doses ? 120 MME/day. Opioid Risk Tool (ORT) Score:  2 Low Risk for SUD (Score <3) Depression Scale Score: PHQ-2: PHQ-2 Total Score: 0 No depression (0) PHQ-9: PHQ-9 Total Score: 0 No depression (0-4)  Pharmacologic Plan: No change in therapy, at this time  Post-Procedure Assessment  Procedure done on last visit: Left-sided T5-6 thoracic epidural steroid injection under fluoroscopic guidance, without sedation. Side-effects or Adverse reactions: None reported Sedation: No sedation used  Results: Ultra-Short Term Relief (First 1 hour after procedure): 80 %  Analgesia during this period is likely to be Local Anesthetic and/or IV Sedative (Analgesic/Anxiolitic) related Short Term Relief (Initial 4-6 hrs after procedure): 100 % Complete relief confirms area to be the source of pain Long Term Relief : 0 % (procedure helped some;then the pain came back) No benefit could suggest etiology to be non-inflammatory, possibly compressive   Current Relief (Now): 0% no benefit, however, she indicates having no pain today.  No persistent benefit would suggest no long-term anti-inflammatory benefit from intervention and/or persistent or recurrent aggravating factors Interpretation of Results: The patient's last pain score before the procedure was a 2/10. Now she indicates not having any  pain.  Laboratory Chemistry  Inflammation Markers Lab Results  Component Value Date   ESRSEDRATE 39* 07/03/2015   CRP 1.9* 07/03/2015    Renal Function Lab Results  Component Value Date   BUN 13 04/12/2015   CREATININE 0.79 04/12/2015   GFRAA >60 04/12/2015   GFRNONAA >60 04/12/2015    Hepatic Function Lab Results  Component Value Date   AST 17 04/12/2015   ALT 12* 04/12/2015   ALBUMIN 3.8 04/12/2015    Electrolytes Lab Results  Component Value Date   NA 135 04/12/2015   K 3.5 04/12/2015   CL 94* 04/12/2015   CALCIUM 10.1 04/12/2015   MG 1.5* 07/03/2015    Pain Modulating Vitamins Lab Results  Component Value Date   VD25OH 43.1 07/03/2015    Coagulation Parameters No results found for: INR, LABPROT  Note: I personally reviewed the above data. Results made available to patient.  Recent Diagnostic Imaging  No results found.  Meds  The patient has a current medication list which includes the following prescription(s): aspirin ec, calcium citrate-vitamin d, cholecalciferol, clobetasol ointment, estradiol, fluticasone, hydrochlorothiazide, hydrocodone-acetaminophen, magnesium oxide, meloxicam, metoprolol succinate, multiple vitamins-minerals, pravastatin, valsartan-hydrochlorothiazide, and gabapentin.  Current Outpatient Prescriptions on File Prior to Visit  Medication Sig  . aspirin EC 81 MG tablet Take 81 mg by mouth.  . calcium citrate-vitamin D (CITRACAL+D) 315-200 MG-UNIT tablet Take by mouth.  . Cholecalciferol (VITAMIN D-1000 MAX ST) 1000 units tablet Take by mouth.  . clobetasol ointment (TEMOVATE) 0.05 % APPLY TWICE DAILY TO SORE AREAS IN MOUTH UNTIL HEALED. AVOID FACE AND SKIN FOLDS.  Marland Kitchen estradiol (ESTRACE) 1 MG tablet Take 1 mg by mouth daily.   . fluticasone (FLONASE) 50 MCG/ACT nasal spray USE 2 PUFFS EACH NOSTRIL AT BEDTIME as needed  . hydrochlorothiazide (HYDRODIURIL) 25 MG tablet Take 25 mg by mouth daily.   . Magnesium Oxide 500 MG CAPS Take  1 capsule (500 mg total) by mouth 2 (two) times daily at 8 am and 10 pm.  . meloxicam (MOBIC) 15 MG tablet TAKE 1 TABLET (15 MG TOTAL)  BY MOUTH ONCE DAILY.  . metoprolol succinate (TOPROL-XL) 50 MG 24 hr tablet Take 50 mg by mouth daily.  . Multiple Vitamins-Minerals (OCUVITE EXTRA PO) Take 1 tablet by mouth daily.   . pravastatin (PRAVACHOL) 80 MG tablet Take 80 mg by mouth at bedtime.  . valsartan-hydrochlorothiazide (DIOVAN-HCT) 160-12.5 MG per tablet Take 1 tablet by mouth daily.   No current facility-administered medications on file prior to visit.    ROS  Constitutional: Denies any fever or chills Gastrointestinal: No reported hemesis, hematochezia, vomiting, or acute GI distress Musculoskeletal: Denies any acute onset joint swelling, redness, loss of ROM, or weakness Neurological: No reported episodes of acute onset apraxia, aphasia, dysarthria, agnosia, amnesia, paralysis, loss of coordination, or loss of consciousness  Allergies  Ms. Piech is allergic to atorvastatin and rosuvastatin.  [place]  Medical:  Ms. Zentgraf  has a past medical history of [place] esophagus; Hypertension; Hyperlipidemia; GERD (gastroesophageal reflux disease); and Allergy. Family: family history includes Arthritis in her mother; Asthma in her father; Diabetes in her father and mother; Heart disease in her father and mother; Hypertension in her father and mother; Stroke in her father. There is no history of Breast cancer. Surgical:  has past surgical history that includes Cholecystectomy; Abdominal hysterectomy; Back surgery; Appendectomy; Tonsilectomy, adenoidectomy, bilateral myringotomy and tubes; and Cardiac catheterization (N/A, 02/07/2015). Tobacco:  reports that she has never smoked. She has never used smokeless tobacco. Alcohol:  reports that she does not drink alcohol. Drug:  reports that she does not use illicit drugs.  Constitutional Exam  Vitals: Blood pressure 122/78, pulse 66, temperature 98.3  F (36.8 C), temperature source Oral, resp. rate 16, height 5\' 3"  (1.6 m), weight 170 lb (77.111 kg), SpO2 96 %. General appearance: Well nourished, well developed, and well hydrated. In no acute distress Calculated BMI/Body habitus: Body mass index is 30.12 kg/(m^2). (30-34.9 kg/m2) Obese (Class I) - 68% higher incidence of chronic pain Psych/Mental status: Alert and oriented x 3 (person, place, & time) Eyes: PERLA Respiratory: No evidence of acute respiratory distress  Cervical Spine Exam  Inspection: No masses, redness, or swelling Alignment: Symmetrical ROM: Functional: Unrestricted ROM Active: Adequate ROM Stability: No instability detected Muscle strength & Tone: Functionally intact Sensory: Unimpaired Palpation: No complaints of tenderness  Upper Extremity (UE) Exam    Side: Right upper extremity  Side: Left upper extremity  Inspection: No masses, redness, swelling, or asymmetry  Inspection: No masses, redness, swelling, or asymmetry  ROM:  ROM:  Functional: Unrestricted ROM  Functional: Unrestricted ROM  Active: Adequate ROM  Active: Adequate ROM  Muscle strength & Tone: Functionally intact  Muscle strength & Tone: Functionally intact  Sensory: Unimpaired  Sensory: Unimpaired  Palpation: Non-contributory  Palpation: Non-contributory   Thoracic Spine Exam  Inspection: No masses, redness, or swelling Alignment: Symmetrical ROM: Functional: Unrestricted ROM Active: Adequate ROM Stability: No instability detected Sensory: Unimpaired Muscle strength & Tone: Functionally intact Palpation: No complaints of tenderness  Lumbar Spine Exam  Inspection: No masses, redness, or swelling Alignment: Symmetrical ROM: Functional: Unrestricted ROM Active: Adequate ROM Stability: No instability detected Muscle strength & Tone: Functionally intact Sensory: Unimpaired Palpation: No complaints of tenderness Provocative Tests: Lumbar Hyperextension and rotation test:  deferred Patrick's Maneuver: deferred  Gait & Posture Assessment  Gait: Unaffected Posture: WNL  Lower Extremity Exam    Side: Right lower extremity  Side: Left lower extremity  Inspection: No masses, redness, swelling, or asymmetry ROM:  Inspection: No masses, redness, swelling, or asymmetry ROM:  Functional:  Unrestricted ROM  Functional: Unrestricted ROM  Active: Adequate ROM  Active: Adequate ROM  Muscle strength & Tone: Functionally intact  Muscle strength & Tone: Functionally intact  Sensory: Unimpaired  Sensory: Unimpaired  Palpation: Non-contributory  Palpation: Non-contributory   Assessment & Plan  Primary Diagnosis & Pertinent Problem List: The primary encounter diagnosis was Chronic pain. Diagnoses of Encounter for therapeutic drug level monitoring, Long term current use of opiate analgesic, Musculoskeletal pain, Chronic thoracic radicular pain (T5/T6) (Left), and Neuropathic pain were also pertinent to this visit.  Visit Diagnosis: 1. Chronic pain   2. Encounter for therapeutic drug level monitoring   3. Long term current use of opiate analgesic   4. Musculoskeletal pain   5. Chronic thoracic radicular pain (T5/T6) (Left)   6. Neuropathic pain     Problems updated and reviewed during this visit: Problem  Musculoskeletal Pain  Neuropathic Pain  Atypical Chest Pain  Acquired Spondylolisthesis  Long Term Current Use of Opiate Analgesic  Supraventricular tachycardia (HCC)  Postoperative Back Pain  Nerve Root Pain   Overview:  Last Assessment & Plan:  Thoracic pain is likely to be secondary to severe left T5 foraminal stenosis.   Spinal Stenosis of Thoracic Region  Status Post Lumbar Spine Operation   Overview:  Overview:  Pedicle screws and posterior hardware at L5 L4-L3 and L2. Neil disc block at the L4-5 disc space. Slight anterior subluxation of L4 on L5. Different configuration and this had L3-4. Degenerative changes of the lumbar disc spaces. L5-S1 disc  space well-maintained .   Acid Reflux  Hld (Hyperlipidemia)  Bp (High Blood Pressure)    Problem-specific Plan(s): No problem-specific assessment & plan notes found for this encounter.  No new assessment & plan notes have been filed under this hospital service since the last note was generated. Service: Pain Management   Plan of Care   Problem List Items Addressed This Visit      High   Chronic pain - Primary (Chronic)   Relevant Medications   HYDROcodone-acetaminophen (NORCO/VICODIN) 5-325 MG tablet   gabapentin (NEURONTIN) 100 MG capsule   Chronic thoracic radicular pain (T5/T6) (Left) (Chronic)   Relevant Orders   THORACIC EPIDURAL STEROID INJECTION   Musculoskeletal pain (Chronic)   Neuropathic pain (Chronic)   Relevant Medications   gabapentin (NEURONTIN) 100 MG capsule     Medium   Encounter for therapeutic drug level monitoring   Long term current use of opiate analgesic (Chronic)       Pharmacotherapy (Medications Ordered): Meds ordered this encounter  Medications  . DISCONTD: cyclobenzaprine (FLEXERIL) 10 MG tablet    Sig: Take 1 tablet (10 mg total) by mouth 3 (three) times daily as needed for muscle spasms.    Dispense:  90 tablet    Refill:  2    Do not add this medication to the electronic "Automatic Refill" notification system. Patient may have prescription filled one day early if pharmacy is closed on scheduled refill date.  Marland Kitchen HYDROcodone-acetaminophen (NORCO/VICODIN) 5-325 MG tablet    Sig: Take 1 tablet by mouth 2 (two) times daily as needed for severe pain.    Dispense:  60 tablet    Refill:  0    Do not add this medication to the electronic "Automatic Refill" notification system. Patient may have prescription filled one day early if pharmacy is closed on scheduled refill date. Do not fill until: 08/30/15 To last until: 09/29/15  . gabapentin (NEURONTIN) 100 MG capsule    Sig: Take  1-3 capsules (100-300 mg total) by mouth at bedtime. Follow  titration schedule.    Dispense:  90 capsule    Refill:  0    Do not place this medication, or any other prescription from our practice, on "Automatic Refill". Patient may have prescription filled one day early if pharmacy is closed on scheduled refill date.    Lab-work & Procedure Ordered: Orders Placed This Encounter  Procedures  . THORACIC EPIDURAL STEROID INJECTION    Imaging Ordered: None  Interventional Therapies: Scheduled:  None at this time.    Considering:  None at this time.    PRN Procedures:  Palliative left T5-6 thoracic epidural steroid injection under fluoroscopic guidance, without sedation.    Referral(s) or Consult(s): None at this time.  New Prescriptions   GABAPENTIN (NEURONTIN) 100 MG CAPSULE    Take 1-3 capsules (100-300 mg total) by mouth at bedtime. Follow titration schedule.    Medications administered during this visit: Ms. Hoppe had no medications administered during this visit.  Requested PM Follow-up: Return in about 6 weeks (around 09/18/2015) for MedMgmt (1-Mo), Procedure (PRN - Patient will call).  Future Appointments Date Time Provider Nowthen  09/18/2015 1:20 PM Milinda Pointer, MD [facility] None    Primary Care Physician: Idelle Crouch, MD Location: [facility] Note by: Kathlen Brunswick. Dossie Arbour, M.D, DABA, DABAPM, DABPM, DABIPP, FIPP  Pain Score Disclaimer: We use the NRS-11 scale. This is a self-reported, subjective measurement of pain severity with only modest accuracy. It is used primarily to identify changes within a particular patient. It must be understood that outpatient pain scales are significantly less accurate that those used for research, where they can be applied under ideal controlled circumstances with minimal exposure to variables. In reality, the score is likely to be a combination of pain intensity and pain affect, where pain affect describes the degree of emotional arousal or  changes in action readiness caused by the sensory experience of pain. Factors such as social and work situation, setting, emotional state, anxiety levels, expectation, and prior pain experience may influence pain perception and show large inter-individual differences that may also be affected by time variables.

## 2015-08-05 NOTE — Patient Instructions (Signed)
Epidural Steroid Injection Patient Information  Description: The epidural space surrounds the nerves as they exit the spinal cord.  In some patients, the nerves can be compressed and inflamed by a bulging disc or a tight spinal canal (spinal stenosis).  By injecting steroids into the epidural space, we can bring irritated nerves into direct contact with a potentially helpful medication.  These steroids act directly on the irritated nerves and can reduce swelling and inflammation which often leads to decreased pain.  Epidural steroids may be injected anywhere along the spine and from the neck to the low back depending upon the location of your pain.   After numbing the skin with local anesthetic (like Novocaine), a small needle is passed into the epidural space slowly.  You may experience a sensation of pressure while this is being done.  The entire block usually last less than 10 minutes.  Conditions which may be treated by epidural steroids:   Low back and leg pain  Neck and arm pain  Spinal stenosis  Post-laminectomy syndrome  Herpes zoster (shingles) pain  Pain from compression fractures  Preparation for the injection:  1. Do not eat any solid food or dairy products within 8 hours of your appointment.  2. You may drink clear liquids up to 3 hours before appointment.  Clear liquids include water, black coffee, juice or soda.  No milk or cream please. 3. You may take your regular medication, including pain medications, with a sip of water before your appointment  Diabetics should hold regular insulin (if taken separately) and take 1/2 normal NPH dos the morning of the procedure.  Carry some sugar containing items with you to your appointment. 4. A driver must accompany you and be prepared to drive you home after your procedure.  5. Bring all your current medications with your. 6. An IV may be inserted and sedation may be given at the discretion of the physician.   7. A blood pressure  cuff, EKG and other monitors will often be applied during the procedure.  Some patients may need to have extra oxygen administered for a short period. 8. You will be asked to provide medical information, including your allergies, prior to the procedure.  We must know immediately if you are taking blood thinners (like Coumadin/Warfarin)  Or if you are allergic to IV iodine contrast (dye). We must know if you could possible be pregnant.  Possible side-effects:  Bleeding from needle site  Infection (rare, may require surgery)  Nerve injury (rare)  Numbness & tingling (temporary)  Difficulty urinating (rare, temporary)  Spinal headache ( a headache worse with upright posture)  Light -headedness (temporary)  Pain at injection site (several days)  Decreased blood pressure (temporary)  Weakness in arm/leg (temporary)  Pressure sensation in back/neck (temporary)  Call if you experience:  Fever/chills associated with headache or increased back/neck pain.  Headache worsened by an upright position.  New onset weakness or numbness of an extremity below the injection site  Hives or difficulty breathing (go to the emergency room)  Inflammation or drainage at the infection site  Severe back/neck pain  Any new symptoms which are concerning to you  Please note:  Although the local anesthetic injected can often make your back or neck feel good for several hours after the injection, the pain will likely return.  It takes 3-7 days for steroids to work in the epidural space.  You may not notice any pain relief for at least that one week.    If effective, we will often do a series of three injections spaced 3-6 weeks apart to maximally decrease your pain.  After the initial series, we generally will wait several months before considering a repeat injection of the same type.  If you have any questions, please call [phone]  [facility] 

## 2015-08-29 ENCOUNTER — Encounter: Payer: Self-pay | Admitting: Pain Medicine

## 2015-08-29 ENCOUNTER — Ambulatory Visit: Payer: Medicare Other | Attending: Pain Medicine | Admitting: Pain Medicine

## 2015-08-29 VITALS — BP 170/68 | HR 71 | Temp 98.3°F | Resp 18 | Ht 63.0 in | Wt 169.0 lb

## 2015-08-29 DIAGNOSIS — E785 Hyperlipidemia, unspecified: Secondary | ICD-10-CM | POA: Diagnosis not present

## 2015-08-29 DIAGNOSIS — M47894 Other spondylosis, thoracic region: Secondary | ICD-10-CM | POA: Insufficient documentation

## 2015-08-29 DIAGNOSIS — G8918 Other acute postprocedural pain: Secondary | ICD-10-CM | POA: Insufficient documentation

## 2015-08-29 DIAGNOSIS — M4324 Fusion of spine, thoracic region: Secondary | ICD-10-CM | POA: Insufficient documentation

## 2015-08-29 DIAGNOSIS — Z79891 Long term (current) use of opiate analgesic: Secondary | ICD-10-CM | POA: Insufficient documentation

## 2015-08-29 DIAGNOSIS — I10 Essential (primary) hypertension: Secondary | ICD-10-CM | POA: Diagnosis not present

## 2015-08-29 DIAGNOSIS — K529 Noninfective gastroenteritis and colitis, unspecified: Secondary | ICD-10-CM | POA: Insufficient documentation

## 2015-08-29 DIAGNOSIS — M791 Myalgia: Secondary | ICD-10-CM | POA: Diagnosis not present

## 2015-08-29 DIAGNOSIS — R05 Cough: Secondary | ICD-10-CM | POA: Diagnosis not present

## 2015-08-29 DIAGNOSIS — M4724 Other spondylosis with radiculopathy, thoracic region: Secondary | ICD-10-CM

## 2015-08-29 DIAGNOSIS — Z981 Arthrodesis status: Secondary | ICD-10-CM | POA: Insufficient documentation

## 2015-08-29 DIAGNOSIS — M5124 Other intervertebral disc displacement, thoracic region: Secondary | ICD-10-CM | POA: Insufficient documentation

## 2015-08-29 DIAGNOSIS — M8580 Other specified disorders of bone density and structure, unspecified site: Secondary | ICD-10-CM | POA: Insufficient documentation

## 2015-08-29 DIAGNOSIS — K589 Irritable bowel syndrome without diarrhea: Secondary | ICD-10-CM | POA: Diagnosis not present

## 2015-08-29 DIAGNOSIS — M4806 Spinal stenosis, lumbar region: Secondary | ICD-10-CM | POA: Insufficient documentation

## 2015-08-29 DIAGNOSIS — K219 Gastro-esophageal reflux disease without esophagitis: Secondary | ICD-10-CM | POA: Insufficient documentation

## 2015-08-29 DIAGNOSIS — R0789 Other chest pain: Secondary | ICD-10-CM | POA: Insufficient documentation

## 2015-08-29 DIAGNOSIS — I471 Supraventricular tachycardia: Secondary | ICD-10-CM | POA: Insufficient documentation

## 2015-08-29 DIAGNOSIS — R937 Abnormal findings on diagnostic imaging of other parts of musculoskeletal system: Secondary | ICD-10-CM | POA: Diagnosis not present

## 2015-08-29 DIAGNOSIS — M4804 Spinal stenosis, thoracic region: Secondary | ICD-10-CM | POA: Diagnosis not present

## 2015-08-29 DIAGNOSIS — M4316 Spondylolisthesis, lumbar region: Secondary | ICD-10-CM | POA: Diagnosis not present

## 2015-08-29 DIAGNOSIS — M5410 Radiculopathy, site unspecified: Secondary | ICD-10-CM | POA: Diagnosis not present

## 2015-08-29 DIAGNOSIS — M549 Dorsalgia, unspecified: Secondary | ICD-10-CM | POA: Diagnosis present

## 2015-08-29 DIAGNOSIS — G8929 Other chronic pain: Secondary | ICD-10-CM | POA: Diagnosis not present

## 2015-08-29 DIAGNOSIS — M5414 Radiculopathy, thoracic region: Secondary | ICD-10-CM

## 2015-08-29 MED ORDER — ROPIVACAINE HCL 2 MG/ML IJ SOLN
2.0000 mL | Freq: Once | INTRAMUSCULAR | Status: AC
  Administered 2015-08-29: 2 mL via EPIDURAL
  Filled 2015-08-29: qty 10

## 2015-08-29 MED ORDER — SODIUM CHLORIDE 0.9% FLUSH
2.0000 mL | Freq: Once | INTRAVENOUS | Status: AC
  Administered 2015-08-29: 2 mL

## 2015-08-29 MED ORDER — DEXAMETHASONE SODIUM PHOSPHATE 10 MG/ML IJ SOLN
10.0000 mg | Freq: Once | INTRAMUSCULAR | Status: AC
  Administered 2015-08-29: 10 mg
  Filled 2015-08-29: qty 1

## 2015-08-29 MED ORDER — LIDOCAINE HCL (PF) 1 % IJ SOLN
10.0000 mL | Freq: Once | INTRAMUSCULAR | Status: AC
  Administered 2015-08-29: 10 mL
  Filled 2015-08-29: qty 10

## 2015-08-29 MED ORDER — IOPAMIDOL (ISOVUE-M 200) INJECTION 41%
10.0000 mL | Freq: Once | INTRAMUSCULAR | Status: AC
  Administered 2015-08-29: 10 mL via EPIDURAL
  Filled 2015-08-29: qty 10

## 2015-08-29 NOTE — Progress Notes (Signed)
Patient's Name: Allison Mclean  Patient type: Established  MRN: [id number]  Service setting: Ambulatory outpatient  DOB: [birth year]  Location: [facility]  DOS: 08/29/2015  Primary Care Physician: Idelle Crouch, MD  Note by: Kathlen Brunswick. Dossie Arbour, M.D, DABA, DABAPM, DABPM, DABIPP, [place]  Referring Physician: Idelle Crouch, MD  Specialty: Board-Certified Interventional Pain Management  Last Visit to Pain Management: 08/05/2015   Primary Reason(s) for Visit: Interventional Pain Management Treatment. CC: Back Pain  Primary Diagnosis: Radicular pain of thoracic region [M54.10]   Procedure:  Anesthesia, Analgesia, Anxiolysis:  Type: Therapeutic Inter-Laminar Thoracic Epidural Block Region: Posterior Thoracolumbar Level: T5-6 Laterality: Left Paraspinal  Indications: 1. Chronic thoracic radicular pain (T5/T6) (Left)   2. Thoracic central spinal stenosis (T7-8, T8-9, T9-10, T10-11, T11-12, and T12-L1)   3. Thoracic spondylosis with radiculopathy (Left)   4. Abnormal MRI, thoracic spine     Pre-procedure Pain Score: 2/10 Reported level of pain is compatible with clinical observations Post-procedure Pain Score: 1   Type: Local Anesthesia Local Anesthetic: Lidocaine 1% Route: Infiltration ([place]/IM) IV Access: Declined Sedation: Declined  Indication(s): Analgesia        Pre-Procedure Assessment:  Allison Mclean is a 80 y.o. year old, female patient, seen today for interventional treatment. She has Acquired spondylolisthesis; Essential (primary) hypertension; Acid reflux; HLD (hyperlipidemia); Adaptive colitis; Supraventricular tachycardia ([place]); Cough; Atypical chest pain; Chronic pain; Long term current use of opiate analgesic; Long term prescription opiate use; Opiate use; Encounter for therapeutic drug level monitoring; Encounter for pain management planning; Osteopenia, senile; Abnormal MRI, thoracic spine; Thoracic central spinal stenosis (T7-8, T8-9, T9-10, T10-11,  T11-12, and T12-L1); Thoracic foraminal stenosis (Moderate to severe at: Right T5; Bilateral T9; Left T11); Chronic thoracic radicular pain (T5/T6) (Left); History of lumbar fusion (12/19/2013) (posterior lumbar fusion from L2-L5); Failed back surgical syndrome; Thoracic facet syndrome (Left); History of fusion of cervical spine; Thoracic spondylosis with radiculopathy (Left); Hypomagnesemia; BP (high blood pressure); Postoperative back pain; Nerve root pain; Spinal stenosis of thoracic region; Status post lumbar spine operation; Musculoskeletal pain; and Neuropathic pain on her problem list.. Her primarily concern today is the Back Pain   Pain Type: Chronic pain Pain Location: Back Pain Orientation: Mid Pain Descriptors / Indicators: Aching, Constant, Radiating Pain Frequency: Constant  Date of Last Visit: 08/05/15 Service Provided on Last Visit: Evaluation  Verification of the correct person, correct site (including marking of site), and correct procedure were performed and confirmed by the patient.  Consent: Secured. Under the influence of no sedatives a written informed consent was obtained, after having provided information on the risks and possible complications. To fulfill our ethical and legal obligations, as recommended by the American Medical Association's Code of Ethics, we have provided information to the patient about our clinical impression; the nature and purpose of the treatment or procedure; the risks, benefits, and possible complications of the intervention; alternatives; the risk(s) and benefit(s) of the alternative treatment(s) or procedure(s); and the risk(s) and benefit(s) of doing nothing. The patient was provided information about the risks and possible complications associated with the procedure. These include, but are not limited to, failure to achieve desired goals, infection, bleeding, organ or nerve damage, allergic reactions, paralysis, and death. In the case of spinal  procedures these may include, but are not limited to, failure to achieve desired goals, infection, bleeding, organ or nerve damage, allergic reactions, paralysis, and death. In addition, the patient was informed that Medicine is not an exact science; therefore, there is also  the possibility of unforeseen risks and possible complications that may result in a catastrophic outcome. The patient indicated having understood very clearly. We have given the patient no guarantees and we have made no promises. Enough time was given to the patient to ask questions, all of which were answered to the patient's satisfaction.  Consent Attestation: I, the ordering provider, attest that I have discussed with the patient the benefits, risks, side-effects, alternatives, likelihood of achieving goals, and potential problems during recovery for the procedure that I have provided informed consent.  Pre-Procedure Preparation: Safety Precautions: Allergies reviewed. Appropriate site, procedure, and patient were confirmed by following the Joint Commission's Universal Protocol (UP.01.01.01), in the form of a "Time Out". The patient was asked to confirm marked site and procedure, before commencing. The patient was asked about blood thinners, or active infections, both of which were denied. Patient was assessed for positional comfort and all pressure points were checked before starting procedure. Allergies: She is allergic to atorvastatin and rosuvastatin.. Infection Control Precautions: Sterile technique used. Standard Universal Precautions were taken as recommended by the Department of [facility] for Disease Control and Prevention (CDC). Standard pre-surgical skin prep was conducted. Respiratory hygiene and cough etiquette was practiced. Hand hygiene observed. Safe injection practices and needle disposal techniques followed. SDV (single dose vial) medications used. Medications properly checked for expiration dates and  contaminants. Personal protective equipment (PPE) used: Surgical mask. Sterile Radiation-resistant gloves. Monitoring:  As per clinic protocol. Filed Vitals:   08/29/15 1155 08/29/15 1200 08/29/15 1205 08/29/15 1210  BP: 153/68 162/78 161/74 170/68  Pulse: 73 73 70 71  Temp:      Resp: '13 15 16 18  ' Height:      Weight:      SpO2: 96% 96% 97% 97%  Calculated BMI: Body mass index is 29.94 kg/(m^2).  Description of Procedure Process:  Time-out: "Time-out" completed before starting procedure, as per protocol. Position: Prone Target Area: For Epidural Steroid injection(s), the target area is the  interlaminar space, initially targeting the lower border of the superior vertebral body lamina. Approach: Interlaminar approach. Area Prepped: Entire Posterior Thoracolumbar Region Prepping solution: Duraprep (Iodine Povacrylex [0.7% available Iodine] and Isopropyl Alcohol, 74% w/w) Safety Precautions: Aspiration looking for blood return was conducted prior to all injections. At no point did we inject any substances, as a needle was being advanced. No attempts were made at seeking any paresthesias. Safe injection practices and needle disposal techniques used. Medications properly checked for expiration dates. SDV (single dose vial) medications used.   Description of the Procedure: Protocol guidelines were followed. The patient was placed in position over the fluoroscopy table. The target area was identified and the area prepped in the usual manner. Skin & deeper tissues infiltrated with local anesthetic. Appropriate amount of time allowed to pass for local anesthetics to take effect. The procedure needles were then advanced to the target area. The inferior aspect of the superior lamina was contacted and the needle walked caudad, until the lamina was cleared. The epidural space was identified using "loss-of-resistance technique" with 0.9% PF-NSS (2-57m), in a low friction 10cc LOR glass syringe. Proper needle  placement was secured. Negative aspiration confirmed. Solution injected in intermittent fashion, asking for systemic symptoms every 0.5 cc of injectate. The needles were then removed and the area cleansed, making sure to leave some of the prepping solution behind to take advantage of its long term bactericidal properties. EBL: None Materials & Medications Used:  Needle(s) Used: 20g - 10cm,  Tuohy-style epidural needle Medication(s): Please see chart orders for medication and dosing details.  Imaging Guidance:   Type of Imaging Technique: Fluoroscopy Guidance (Spinal) Indication(s): Assistance in needle guidance and placement for procedures requiring needle placement in or near specific anatomical locations not easily accessible without such assistance. Exposure Time: Please see nurses notes. Contrast: Before injecting any contrast, we confirmed that the patient did not have an allergy to iodine, shellfish, or radiological contrast. Once satisfactory needle placement was completed at the desired level, radiological contrast was injected. Injection was conducted under continuous fluoroscopic guidance. Injection of contrast accomplished without complications. See chart for type and volume of contrast used. Fluoroscopic Guidance: I was personally present in the fluoroscopy suite, where the patient was placed in position for the procedure, over the fluoroscopy-compatible table. Fluoroscopy was manipulated, using "Tunnel Vision Technique", to obtain the best possible view of the target area, on the affected side. Parallax error was corrected before commencing the procedure. A "direction-depth-direction" technique was used to introduce the needle under continuous pulsed fluoroscopic guidance. Once the target was reached, antero-posterior, oblique, and lateral fluoroscopic projection views were taken to confirm needle placement in all planes. Permanently recorded images stored by scanning into  EMR. Interpretation: Intraoperative imaging interpretation by performing Physician. Adequate needle placement confirmed in AP & Oblique Views. Appropriate spread of contrast to desired area. No evidence of afferent or efferent intravascular uptake. No intrathecal or subarachnoid spread observed. Permanent images scanned into the patient's record.  Antibiotic Prophylaxis:  Indication(s): No indications identified. Type:  Antibiotics Given (last 72 hours)    None       Post-operative Assessment:   Complications: No immediate post-treatment complications were observed. Disposition: Return to clinic for follow-up evaluation. The patient tolerated the entire procedure well. A repeat set of vitals were taken after the procedure and the patient was kept under observation following institutional policy, for this type of procedure. Post-procedural neurological assessment was performed, showing return to baseline, prior to discharge. The patient was discharged home, once institutional criteria were met. The patient was provided with post-procedure discharge instructions, including a section on how to identify potential problems. Should any problems arise concerning this procedure, the patient was given instructions to immediately contact us, at any time, without hesitation. In any case, we plan to contact the patient by telephone for a follow-up status report regarding this interventional procedure. Comments:  No additional relevant information.  Medications administered during this visit: We administered iopamidol, lidocaine (PF), sodium chloride flush, ropivacaine (PF) 2 mg/ml (0.2%), and dexamethasone.  Prescriptions ordered during this visit: New Prescriptions   No medications on file   Problem  Abnormal Mri, Thoracic Spine   IMPRESSION: 1. 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 Subsequent moderate to severe adjacent segment facet  arthropathy. 2. Widespread thoracic spine disc bulging. Occasional small superimposed disc protrusions (T8-9, T11-12). 3. Subsequent multifactorial mild thoracic spinal stenosis T7-8, T8-9, T9-10, T10-11, T11-12, and T12-L1. Up to mild spinal cord mass effect at T8-9 and T12-L1, no thoracic spinal cord signal abnormality. 4. Multifactorial moderate or severe neural foraminal stenosis at the right T5, bilateral T9 and left T11 nerve levels. 5. Lower cervical spine ACDF, new since 2010.      Future Appointments Date Time Provider [place]  09/18/2015 1:20 PM Milinda Pointer, MD [facility] None    Primary Care Physician: Idelle Crouch, MD Location: [facility] Note by: Kathlen Brunswick. Dossie Arbour, M.D, [place], [place],  DABPM, DABIPP, FIPP  Disclaimer:  Medicine is not an Chief Strategy Officer. The only guarantee in medicine is that nothing is guaranteed. It is important to note that the decision to proceed with this intervention was based on the information collected from the patient. The Data and conclusions were drawn from the patient's questionnaire, the interview, and the physical examination. Because the information was provided in large part by the patient, it cannot be guaranteed that it has not been purposely or unconsciously manipulated. Every effort has been made to obtain as much relevant data as possible for this evaluation. It is important to note that the conclusions that lead to this procedure are derived in large part from the available data. Always take into account that the treatment will also be dependent on availability of resources and existing treatment guidelines, considered by other Pain Management Practitioners as being common knowledge and practice, at the time of the intervention. For Medico-Legal purposes, it is also important to point out that variation in procedural techniques and pharmacological choices are the acceptable norm. The indications,  contraindications, technique, and results of the above procedure should only be interpreted and judged by a Board-Certified Interventional Pain Specialist with extensive familiarity and expertise in the same exact procedure and technique. Attempts at providing opinions without similar or greater experience and expertise than that of the treating physician will be considered as inappropriate and unethical, and shall result in a formal complaint to the state medical board and applicable specialty societies.

## 2015-08-29 NOTE — Patient Instructions (Signed)
Epidural Steroid Injection An epidural steroid injection is given to relieve pain in your neck, back, or legs that is caused by the irritation or swelling of a nerve root. This procedure involves injecting a steroid and numbing medicine (anesthetic) into the epidural space. The epidural space is the space between the outer covering of your spinal cord and the bones that form your backbone (vertebra).  LET YOUR HEALTH CARE PROVIDER KNOW ABOUT:   Any allergies you have.  All medicines you are taking, including vitamins, herbs, eye drops, creams, and over-the-counter medicines such as aspirin.  Previous problems you or members of your family have had with the use of anesthetics.  Any blood disorders or blood clotting disorders you have.  Previous surgeries you have had.  Medical conditions you have. RISKS AND COMPLICATIONS Generally, this is a safe procedure. However, as with any procedure, complications can occur. Possible complications of epidural steroid injection include:  Headache.  Bleeding.  Infection.  Allergic reaction to the medicines.  Damage to your nerves. The response to this procedure depends on the underlying cause of the pain and its duration. People who have long-term (chronic) pain are less likely to benefit from epidural steroids than are those people whose pain comes on strong and suddenly. BEFORE THE PROCEDURE   Ask your health care provider about changing or stopping your regular medicines. You may be advised to stop taking blood-thinning medicines a few days before the procedure.  You may be given medicines to reduce anxiety.  Arrange for someone to take you home after the procedure. PROCEDURE   You will remain awake during the procedure. You may receive medicine to make you relaxed.  You will be asked to lie on your stomach.  The injection site will be cleaned.  The injection site will be numbed with a medicine (local anesthetic).  A needle will be  injected through your skin into the epidural space.  Your health care provider will use an X-ray machine to ensure that the steroid is delivered closest to the affected nerve. You may have minimal discomfort at this time.  Once the needle is in the right position, the local anesthetic and the steroid will be injected into the epidural space.  The needle will then be removed and a bandage will be applied to the injection site. AFTER THE PROCEDURE   You may be monitored for a short time before you go home.  You may feel weakness or numbness in your arm or leg, which disappears within hours.  You may be allowed to eat, drink, and take your regular medicine.  You may have soreness at the site of the injection.   This information is not intended to replace advice given to you by your health care provider. Make sure you discuss any questions you have with your health care provider.   Document Released: 06/30/2007 Document Revised: 11/23/2012 Document Reviewed: 09/09/2012 Elsevier Interactive Patient Education 2016 [employer] Pain Management Discharge Instructions  General Discharge Instructions :  If you need to reach your doctor call: Monday-Friday 8:00 am - 4:00 pm at [phone] or toll free [phone].  After clinic hours [phone] to have operator reach doctor.  Bring all of your medication bottles to all your appointments in the pain clinic.  To cancel or reschedule your appointment with Pain Management please remember to call 24 hours in advance to avoid a fee.  Refer to the educational materials which you have been given on: General Risks, I had my Procedure.   Discharge Instructions, Post Sedation.  Post Procedure Instructions:  The drugs you were given will stay in your system until tomorrow, so for the next 24 hours you should not drive, make any legal decisions or drink any alcoholic beverages.  You may eat anything you prefer, but it is better to start with  liquids then soups and crackers, and gradually work up to solid foods.  Please notify your doctor immediately if you have any unusual bleeding, trouble breathing or pain that is not related to your normal pain.  Depending on the type of procedure that was done, some parts of your body may feel week and/or numb.  This usually clears up by tonight or the next day.  Walk with the use of an assistive device or accompanied by an adult for the 24 hours.  You may use ice on the affected area for the first 24 hours.  Put ice in a Ziploc bag and cover with a towel and place against area 15 minutes on 15 minutes off.  You may switch to heat after 24 hours. 

## 2015-08-29 NOTE — Progress Notes (Signed)
Safety precautions to be maintained throughout the outpatient stay will include: orient to surroundings, keep bed in low position, maintain call bell within reach at all times, provide assistance with transfer out of bed and ambulation.  

## 2015-08-29 NOTE — Assessment & Plan Note (Signed)
CLINICAL DATA: 80 year old female with thoracic spine pain radiating round rib cage for 6 months. Thoracic radiculitis. Initial encounter.  EXAM: MRI THORACIC SPINE WITHOUT CONTRAST  TECHNIQUE: Multiplanar, multisequence MR imaging of the thoracic spine was performed. No intravenous contrast was administered.  COMPARISON: Chest CT 02/14/2015. Cervical spine MRI 07/10/2008.  FINDINGS: Limited sagittal imaging of the cervical spine remarkable for interval ACDF placement at C5-C6 and C6-C7.  Stable lumbar vertebral height and alignment since November. 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 No marrow edema or evidence of acute osseous abnormality.  Aberrant origin of the right subclavian artery, normal anatomic variant. Stable visualized thoracic and upper abdominal viscera. Posterior Thoracic paraspinal soft tissues appear within normal limits. No posterior rib abnormality identified.  There are partially visible postoperative changes to the upper lumbar spine posterior soft tissues at L2.  T1-T2: Mild disc bulge. Mild to moderate facet hypertrophy. No spinal stenosis. Mild to moderate T1 foraminal stenosis greater on the left.  T2-T3: Mild disc bulge. Mild facet hypertrophy. Mild to moderate T2 foraminal stenosis greater on the right.  T3-T4: Mild disc bulge. Mild to moderate facet hypertrophy. Mild to moderate T3 foraminal stenosis, greater on the left.  T4-T5: Congenital incomplete segmentation. Otherwise negative.  T5-T6: Mild left eccentric disc bulge. Moderate to severe facet hypertrophy. Moderate to severe left and mild-to-moderate right T5 foraminal stenosis.  T6-T7: Mild disc bulge. Mild facet hypertrophy greater on the left. Mild to moderate left T6 foraminal stenosis.  T7-T8: Mild disc bulge. Moderate facet and ligament  flavum hypertrophy. Mild spinal stenosis (series 7, image 20). No spinal cord mass effect. Mild to moderate right T7 foraminal stenosis.  T8-T9: Circumferential disc bulge with superimposed small central disc protrusion best seen on series 8, image 23. Mild to moderate facet and ligament flavum hypertrophy. Mild spinal stenosis (series 7, image 23), mild if any ventral cord mass effect. Mild left T8 foraminal stenosis.  T9-T10: Circumferential disc bulge. Moderate to severe facet and ligament flavum hypertrophy. Mild spinal stenosis (series 7, image 25). No cord mass effect. Moderate to severe bilateral T9 foraminal stenosis.  T10-T11: Mild right eccentric disc bulge. Mild to moderate facet and ligament flavum hypertrophy. Somewhat dysplastic appearance of the right posterior elements, confirmed on the comparison CT and with posterior element ankylosis on the right (Series 4, image 4). Borderline to mild spinal stenosis. No spinal cord mass effect. Mild left T10 foraminal stenosis.  T11-T12: Anterior eccentric circumferential disc bulge. Moderate to severe facet and ligament flavum hypertrophy. Mild spinal stenosis (series 7, image 32). No spinal cord mass effect. Moderate to severe left T11 foraminal stenosis. Mild right T11 foraminal stenosis.  T12-L1: Circumferential disc bulge with superimposed broad-based left paracentral disc protrusion best seen on series 7, image 35. Mild facet and ligament flavum hypertrophy. Mild spinal stenosis, with up to mild spinal cord mass effect (series 7, image 35). No foraminal stenosis.  No thoracic spinal cord signal abnormality. Conus medullaris occurs below the L1-L2 level.  IMPRESSION: 1. 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 Subsequent moderate to severe adjacent segment facet arthropathy. 2. Widespread thoracic spine disc bulging. Occasional small superimposed disc  protrusions (T8-T9, T11-T12). 3. Subsequent multifactorial mild thoracic spinal stenosis T7-T8, T8-T9, T9-T10, T10-T11, T11-T12, and T12-L1. Up to mild spinal cord mass effect at T8-T9 and T12-L1, no thoracic spinal cord signal abnormality. 4.  Multifactorial moderate or severe neural foraminal stenosis at the right T5, bilateral T9 and left T11 nerve levels. 5. Lower cervical spine ACDF, new since 2010.   Electronically Signed  By: Genevie Ann M.D.  On: 04/29/2015 14:04

## 2015-08-30 ENCOUNTER — Telehealth: Payer: Self-pay

## 2015-08-30 NOTE — Telephone Encounter (Signed)
Denies any problems,other than she couldn't sleep well cause of the steroids, intructed to call if needed

## 2015-09-11 ENCOUNTER — Other Ambulatory Visit: Payer: Self-pay | Admitting: Internal Medicine

## 2015-09-11 DIAGNOSIS — R55 Syncope and collapse: Secondary | ICD-10-CM

## 2015-09-12 ENCOUNTER — Ambulatory Visit: Payer: Medicare Other | Admitting: Pain Medicine

## 2015-09-18 ENCOUNTER — Ambulatory Visit: Payer: Medicare Other | Attending: Pain Medicine | Admitting: Pain Medicine

## 2015-09-18 ENCOUNTER — Encounter: Payer: Self-pay | Admitting: Pain Medicine

## 2015-09-18 VITALS — BP 128/46 | HR 89 | Temp 97.9°F | Resp 24 | Ht 63.0 in | Wt 169.0 lb

## 2015-09-18 DIAGNOSIS — M792 Neuralgia and neuritis, unspecified: Secondary | ICD-10-CM

## 2015-09-18 DIAGNOSIS — G8929 Other chronic pain: Secondary | ICD-10-CM

## 2015-09-18 DIAGNOSIS — M4806 Spinal stenosis, lumbar region: Secondary | ICD-10-CM | POA: Insufficient documentation

## 2015-09-18 DIAGNOSIS — K529 Noninfective gastroenteritis and colitis, unspecified: Secondary | ICD-10-CM | POA: Diagnosis not present

## 2015-09-18 DIAGNOSIS — R05 Cough: Secondary | ICD-10-CM | POA: Diagnosis not present

## 2015-09-18 DIAGNOSIS — G8918 Other acute postprocedural pain: Secondary | ICD-10-CM | POA: Diagnosis not present

## 2015-09-18 DIAGNOSIS — I471 Supraventricular tachycardia: Secondary | ICD-10-CM | POA: Insufficient documentation

## 2015-09-18 DIAGNOSIS — M4310 Spondylolisthesis, site unspecified: Secondary | ICD-10-CM | POA: Diagnosis not present

## 2015-09-18 DIAGNOSIS — E785 Hyperlipidemia, unspecified: Secondary | ICD-10-CM | POA: Insufficient documentation

## 2015-09-18 DIAGNOSIS — K219 Gastro-esophageal reflux disease without esophagitis: Secondary | ICD-10-CM | POA: Insufficient documentation

## 2015-09-18 DIAGNOSIS — M549 Dorsalgia, unspecified: Secondary | ICD-10-CM | POA: Diagnosis present

## 2015-09-18 DIAGNOSIS — Z9889 Other specified postprocedural states: Secondary | ICD-10-CM | POA: Diagnosis not present

## 2015-09-18 DIAGNOSIS — M8580 Other specified disorders of bone density and structure, unspecified site: Secondary | ICD-10-CM | POA: Insufficient documentation

## 2015-09-18 DIAGNOSIS — M47894 Other spondylosis, thoracic region: Secondary | ICD-10-CM | POA: Insufficient documentation

## 2015-09-18 DIAGNOSIS — R0789 Other chest pain: Secondary | ICD-10-CM | POA: Insufficient documentation

## 2015-09-18 DIAGNOSIS — Z79891 Long term (current) use of opiate analgesic: Secondary | ICD-10-CM

## 2015-09-18 DIAGNOSIS — Z7982 Long term (current) use of aspirin: Secondary | ICD-10-CM | POA: Insufficient documentation

## 2015-09-18 DIAGNOSIS — M4804 Spinal stenosis, thoracic region: Secondary | ICD-10-CM

## 2015-09-18 DIAGNOSIS — M5126 Other intervertebral disc displacement, lumbar region: Secondary | ICD-10-CM | POA: Insufficient documentation

## 2015-09-18 DIAGNOSIS — M5114 Intervertebral disc disorders with radiculopathy, thoracic region: Secondary | ICD-10-CM | POA: Insufficient documentation

## 2015-09-18 DIAGNOSIS — I10 Essential (primary) hypertension: Secondary | ICD-10-CM | POA: Diagnosis not present

## 2015-09-18 DIAGNOSIS — M5410 Radiculopathy, site unspecified: Secondary | ICD-10-CM

## 2015-09-18 DIAGNOSIS — M4724 Other spondylosis with radiculopathy, thoracic region: Secondary | ICD-10-CM | POA: Diagnosis not present

## 2015-09-18 DIAGNOSIS — Z981 Arthrodesis status: Secondary | ICD-10-CM | POA: Insufficient documentation

## 2015-09-18 DIAGNOSIS — Z5181 Encounter for therapeutic drug level monitoring: Secondary | ICD-10-CM

## 2015-09-18 NOTE — Patient Instructions (Signed)
GENERAL RISKS AND COMPLICATIONS  What are the risk, side effects and possible complications? Generally speaking, most procedures are safe.  However, with any procedure there are risks, side effects, and the possibility of complications.  The risks and complications are dependent upon the sites that are lesioned, or the type of nerve block to be performed.  The closer the procedure is to the spine, the more serious the risks are.  Great care is taken when placing the radio frequency needles, block needles or lesioning probes, but sometimes complications can occur. 1. Infection: Any time there is an injection through the skin, there is a risk of infection.  This is why sterile conditions are used for these blocks.  There are four possible types of infection. 1. Localized skin infection. 2. Central Nervous System Infection-This can be in the form of Meningitis, which can be deadly. 3. Epidural Infections-This can be in the form of an epidural abscess, which can cause pressure inside of the spine, causing compression of the spinal cord with subsequent paralysis. This would require an emergency surgery to decompress, and there are no guarantees that the patient would recover from the paralysis. 4. Discitis-This is an infection of the intervertebral discs.  It occurs in about 1% of discography procedures.  It is difficult to treat and it may lead to surgery.        2. Pain: the needles have to go through skin and soft tissues, will cause soreness.       3. Damage to internal structures:  The nerves to be lesioned may be near blood vessels or    other nerves which can be potentially damaged.       4. Bleeding: Bleeding is more common if the patient is taking blood thinners such as  aspirin, Coumadin, Ticiid, Plavix, etc., or if he/she have some genetic predisposition  such as hemophilia. Bleeding into the spinal canal can cause compression of the spinal  cord with subsequent paralysis.  This would require an  emergency surgery to  decompress and there are no guarantees that the patient would recover from the  paralysis.       5. Pneumothorax:  Puncturing of a lung is a possibility, every time a needle is introduced in  the area of the chest or upper back.  Pneumothorax refers to free air around the  collapsed lung(s), inside of the thoracic cavity (chest cavity).  Another two possible  complications related to a similar event would include: Hemothorax and Chylothorax.   These are variations of the Pneumothorax, where instead of air around the collapsed  lung(s), you may have blood or chyle, respectively.       6. Spinal headaches: They may occur with any procedures in the area of the spine.       7. Persistent CSF (Cerebro-Spinal Fluid) leakage: This is a rare problem, but may occur  with prolonged intrathecal or epidural catheters either due to the formation of a fistulous  track or a dural tear.       8. Nerve damage: By working so close to the spinal cord, there is always a possibility of  nerve damage, which could be as serious as a permanent spinal cord injury with  paralysis.       9. Death:  Although rare, severe deadly allergic reactions known as "Anaphylactic  reaction" can occur to any of the medications used.      10. Worsening of the symptoms:  We can always make thing worse.    What are the chances of something like this happening? Chances of any of this occuring are extremely low.  By statistics, you have more of a chance of getting killed in a motor vehicle accident: while driving to the hospital than any of the above occurring .  Nevertheless, you should be aware that they are possibilities.  In general, it is similar to taking a shower.  Everybody knows that you can slip, hit your head and get killed.  Does that mean that you should not shower again?  Nevertheless always keep in mind that statistics do not mean anything if you happen to be on the wrong side of them.  Even if a procedure has a 1  (one) in a 1,000,000 (million) chance of going wrong, it you happen to be that one..Also, keep in mind that by statistics, you have more of a chance of having something go wrong when taking medications.  Who should not have this procedure? If you are on a blood thinning medication (e.g. Coumadin, Plavix, see list of "Blood Thinners"), or if you have an active infection going on, you should not have the procedure.  If you are taking any blood thinners, please inform your physician.  How should I prepare for this procedure?  Do not eat or drink anything at least six hours prior to the procedure.  Bring a driver with you .  It cannot be a taxi.  Come accompanied by an adult that can drive you back, and that is strong enough to help you if your legs get weak or numb from the local anesthetic.  Take all of your medicines the morning of the procedure with just enough water to swallow them.  If you have diabetes, make sure that you are scheduled to have your procedure done first thing in the morning, whenever possible.  If you have diabetes, take only half of your insulin dose and notify our nurse that you have done so as soon as you arrive at the clinic.  If you are diabetic, but only take blood sugar pills (oral hypoglycemic), then do not take them on the morning of your procedure.  You may take them after you have had the procedure.  Do not take aspirin or any aspirin-containing medications, at least eleven (11) days prior to the procedure.  They may prolong bleeding.  Wear loose fitting clothing that may be easy to take off and that you would not mind if it got stained with Betadine or blood.  Do not wear any jewelry or perfume  Remove any nail coloring.  It will interfere with some of our monitoring equipment.  NOTE: Remember that this is not meant to be interpreted as a complete list of all possible complications.  Unforeseen problems may occur.  BLOOD THINNERS The following drugs  contain aspirin or other products, which can cause increased bleeding during surgery and should not be taken for 2 weeks prior to and 1 week after surgery.  If you should need take something for relief of minor pain, you may take acetaminophen which is found in Tylenol,m Datril, Anacin-3 and Panadol. It is not blood thinner. The products listed below are.  Do not take any of the products listed below in addition to any listed on your instruction sheet.  A.P.C or A.P.C with Codeine Codeine Phosphate Capsules #3 Ibuprofen Ridaura  ABC compound Congesprin Imuran rimadil  Advil Cope Indocin Robaxisal  Alka-Seltzer Effervescent Pain Reliever and Antacid Coricidin or Coricidin-D  Indomethacin Rufen    Alka-Seltzer plus Cold Medicine Cosprin Ketoprofen S-A-C Tablets  Anacin Analgesic Tablets or Capsules Coumadin Korlgesic Salflex  Anacin Extra Strength Analgesic tablets or capsules CP-2 Tablets Lanoril Salicylate  Anaprox Cuprimine Capsules Levenox Salocol  Anexsia-D Dalteparin Magan Salsalate  Anodynos Darvon compound Magnesium Salicylate Sine-off  Ansaid Dasin Capsules Magsal Sodium Salicylate  Anturane Depen Capsules Marnal Soma  APF Arthritis pain formula Dewitt's Pills Measurin Stanback  Argesic Dia-Gesic Meclofenamic Sulfinpyrazone  Arthritis Bayer Timed Release Aspirin Diclofenac Meclomen Sulindac  Arthritis pain formula Anacin Dicumarol Medipren Supac  Analgesic (Safety coated) Arthralgen Diffunasal Mefanamic Suprofen  Arthritis Strength Bufferin Dihydrocodeine Mepro Compound Suprol  Arthropan liquid Dopirydamole Methcarbomol with Aspirin Synalgos  ASA tablets/Enseals Disalcid Micrainin Tagament  Ascriptin Doan's Midol Talwin  Ascriptin A/D Dolene Mobidin Tanderil  Ascriptin Extra Strength Dolobid Moblgesic Ticlid  Ascriptin with Codeine Doloprin or Doloprin with Codeine Momentum Tolectin  Asperbuf Duoprin Mono-gesic Trendar  Aspergum Duradyne Motrin or Motrin IB Triminicin  Aspirin  plain, buffered or enteric coated Durasal Myochrisine Trigesic  Aspirin Suppositories Easprin Nalfon Trillsate  Aspirin with Codeine Ecotrin Regular or Extra Strength Naprosyn Uracel  Atromid-S Efficin Naproxen Ursinus  Auranofin Capsules Elmiron Neocylate Vanquish  Axotal Emagrin Norgesic Verin  Azathioprine Empirin or Empirin with Codeine Normiflo Vitamin E  Azolid Emprazil Nuprin Voltaren  Bayer Aspirin plain, buffered or children's or timed BC Tablets or powders Encaprin Orgaran Warfarin Sodium  Buff-a-Comp Enoxaparin Orudis Zorpin  Buff-a-Comp with Codeine Equegesic Os-Cal-Gesic   Buffaprin Excedrin plain, buffered or Extra Strength Oxalid   Bufferin Arthritis Strength Feldene Oxphenbutazone   Bufferin plain or Extra Strength Feldene Capsules Oxycodone with Aspirin   Bufferin with Codeine Fenoprofen Fenoprofen Pabalate or Pabalate-SF   Buffets II Flogesic Panagesic   Buffinol plain or Extra Strength Florinal or Florinal with Codeine Panwarfarin   Buf-Tabs Flurbiprofen Penicillamine   Butalbital Compound Four-way cold tablets Penicillin   Butazolidin Fragmin Pepto-Bismol   Carbenicillin Geminisyn Percodan   Carna Arthritis Reliever Geopen Persantine   Carprofen Gold's salt Persistin   Chloramphenicol Goody's Phenylbutazone   Chloromycetin Haltrain Piroxlcam   Clmetidine heparin Plaquenil   Cllnoril Hyco-pap Ponstel   Clofibrate Hydroxy chloroquine Propoxyphen         Before stopping any of these medications, be sure to consult the physician who ordered them.  Some, such as Coumadin (Warfarin) are ordered to prevent or treat serious conditions such as "deep thrombosis", "pumonary embolisms", and other heart problems.  The amount of time that you may need off of the medication may also vary with the medication and the reason for which you were taking it.  If you are taking any of these medications, please make sure you notify your pain physician before you undergo any  procedures.         Epidural Steroid Injection Patient Information  Description: The epidural space surrounds the nerves as they exit the spinal cord.  In some patients, the nerves can be compressed and inflamed by a bulging disc or a tight spinal canal (spinal stenosis).  By injecting steroids into the epidural space, we can bring irritated nerves into direct contact with a potentially helpful medication.  These steroids act directly on the irritated nerves and can reduce swelling and inflammation which often leads to decreased pain.  Epidural steroids may be injected anywhere along the spine and from the neck to the low back depending upon the location of your pain.   After numbing the skin with local anesthetic (like Novocaine), a small needle is passed   into the epidural space slowly.  You may experience a sensation of pressure while this is being done.  The entire block usually last less than 10 minutes.  Conditions which may be treated by epidural steroids:   Low back and leg pain  Neck and arm pain  Spinal stenosis  Post-laminectomy syndrome  Herpes zoster (shingles) pain  Pain from compression fractures  Preparation for the injection:  1. Do not eat any solid food or dairy products within 8 hours of your appointment.  2. You may drink clear liquids up to 3 hours before appointment.  Clear liquids include water, black coffee, juice or soda.  No milk or cream please. 3. You may take your regular medication, including pain medications, with a sip of water before your appointment  Diabetics should hold regular insulin (if taken separately) and take 1/2 normal NPH dos the morning of the procedure.  Carry some sugar containing items with you to your appointment. 4. A driver must accompany you and be prepared to drive you home after your procedure.  5. Bring all your current medications with your. 6. An IV may be inserted and sedation may be given at the discretion of the  physician.   7. A blood pressure cuff, EKG and other monitors will often be applied during the procedure.  Some patients may need to have extra oxygen administered for a short period. 8. You will be asked to provide medical information, including your allergies, prior to the procedure.  We must know immediately if you are taking blood thinners (like Coumadin/Warfarin)  Or if you are allergic to IV iodine contrast (dye). We must know if you could possible be pregnant.  Possible side-effects:  Bleeding from needle site  Infection (rare, may require surgery)  Nerve injury (rare)  Numbness & tingling (temporary)  Difficulty urinating (rare, temporary)  Spinal headache ( a headache worse with upright posture)  Light -headedness (temporary)  Pain at injection site (several days)  Decreased blood pressure (temporary)  Weakness in arm/leg (temporary)  Pressure sensation in back/neck (temporary)  Call if you experience:  Fever/chills associated with headache or increased back/neck pain.  Headache worsened by an upright position.  New onset weakness or numbness of an extremity below the injection site  Hives or difficulty breathing (go to the emergency room)  Inflammation or drainage at the infection site  Severe back/neck pain  Any new symptoms which are concerning to you  Please note:  Although the local anesthetic injected can often make your back or neck feel good for several hours after the injection, the pain will likely return.  It takes 3-7 days for steroids to work in the epidural space.  You may not notice any pain relief for at least that one week.  If effective, we will often do a series of three injections spaced 3-6 weeks apart to maximally decrease your pain.  After the initial series, we generally will wait several months before considering a repeat injection of the same type.  If you have any questions, please call [phone] [facility] 

## 2015-09-18 NOTE — Progress Notes (Signed)
Patient's Name: Allison Mclean  Patient type: Established  MRN: [id number]  Service setting: Ambulatory outpatient  DOB: 25-Jun-1935  Location: [facility]  DOS: 09/18/2015  Primary Care Physician: Idelle Crouch, MD  Note by: Kathlen Brunswick. Dossie Arbour, M.D, DABA, DABAPM, DABPM, DABIPP, [place]  Referring Physician: Idelle Crouch, MD  Specialty: Board-Certified Interventional Pain Management  Last Visit to Pain Management: 08/30/2015   Primary Reason(s) for Visit: Encounter for prescription drug management & post-procedure evaluation of chronic illness with mild to moderate exacerbation(Level of risk: moderate) CC: Back Pain   HPI  [name] is a 80 y.o. year old, female patient, who returns today as an established patient. She has Acquired spondylolisthesis; Essential (primary) hypertension; Acid reflux; HLD (hyperlipidemia); Adaptive colitis; Supraventricular tachycardia ([place]); Cough; Atypical chest pain; Chronic pain; Long term current use of opiate analgesic; Long term prescription opiate use; Opiate use; Encounter for therapeutic drug level monitoring; Encounter for pain management planning; Osteopenia, senile; Abnormal MRI, thoracic spine; Thoracic central spinal stenosis (T7-8, T8-9, T9-10, T10-11, T11-12, and T12-L1); Thoracic foraminal stenosis (Moderate to severe at: Right T5; Bilateral T9; Left T11); Chronic thoracic radicular pain (T5/T6) (Left); History of lumbar fusion (12/19/2013) (posterior lumbar fusion from L2-L5); Failed back surgical syndrome; Thoracic facet syndrome (Left); History of fusion of cervical spine; Thoracic spondylosis with radiculopathy (Left); Hypomagnesemia; BP (high blood pressure); Postoperative back pain; Nerve root pain; Spinal stenosis of thoracic region; Status post lumbar spine operation; Musculoskeletal pain; and Neuropathic pain on her problem list.. Her primarily concern today is the Back Pain   Pain Assessment: Self-Reported Pain  Score: 3  Reported level is compatible with observation       Pain Type: Chronic pain Pain Location: Back Pain Orientation: Upper Pain Descriptors / Indicators: Radiating, Constant, Burning, Penetrating Pain Frequency: Constant  The patient comes into the clinics today for post-procedure evaluation on the interventional treatment done on 08/29/2015. In addition, she comes in today for pharmacological management of her chronic pain.  The patient  reports that she does not use illicit drugs.   Pending evaluation with Dr. Vertell Limber on Monday.  Date of Last Visit: 08/29/15 Service Provided on Last Visit: Procedure (TESI)  Controlled Substance Pharmacotherapy Assessment & REMS (Risk Evaluation and Mitigation Strategy)  Analgesic: Hydrocodone/APAP 5/325 one every 6 hours when necessary (20 mg/day) Pill Count: Pateint did not bring medication bottles today. States forgot. The patient also states that she has another and field prescription at home. MME/day: 20 mg/day.  Pharmacokinetics: Onset of action (Liberation/Absorption): Within expected pharmacological parameters Time to Peak effect (Distribution): Timing and results are as within normal expected parameters Duration of action (Metabolism/Excretion): Within normal limits for medication Pharmacodynamics: Analgesic Effect: More than 50% Activity Facilitation: Medication(s) allow patient to sit, stand, walk, and do the basic ADLs Perceived Effectiveness: Described as relatively effective, allowing for increase in activities of daily living (ADL) Side-effects or Adverse reactions: None reported Monitoring: [place] PMP: Online review of the past 37-month period conducted. Compliant with practice rules and regulations Last UDS on record: All Historical UDS testing(s): No results found for: MDMA, COCAINSCRNUR, PCPSCRNUR, THCU, ETH Initial UDS Testing: SUMMARY  Date Value Ref Range Status  07/02/2015 FINAL  Final    Comment:     ==================================================================== TOXAS[facility]RE COMP DRUG ANALYSIS,UR ==================================================================== Test                             Result  Flag       Units Drug Present and Declared for Prescription Verification   Hydrocodone                    216          EXPECTED   ng/mg creat   Hydromorphone                  256          EXPECTED   ng/mg creat   Dihydrocodeine                 59           EXPECTED   ng/mg creat   Norhydrocodone                 178          EXPECTED   ng/mg creat    Sources of hydrocodone include scheduled prescription    medications. Hydromorphone, dihydrocodeine and norhydrocodone are    expected metabolites of hydrocodone. Hydromorphone and    dihydrocodeine are also available as scheduled prescription    medications.   Cyclobenzaprine                PRESENT      EXPECTED   Metoprolol                     PRESENT      EXPECTED Drug Absent but Declared for Prescription Verification   Acetaminophen                  Not Detected UNEXPECTED    Acetaminophen, as indicated in the declared medication list, is    not always detected even when used as directed.   Salicylate                     Not Detected UNEXPECTED    Aspirin, as indicated in the declared medication list, is not    always detected even when used as directed. ==================================================================== Test                      Result    Flag   Units      Ref Range   Creatinine              176              mg/dL      >=20 ==================================================================== Declared Medications:  The flagging and interpretation on this report are based on the  following declared medications.  Unexpected results may arise from  inaccuracies in the declared medications.  **Note: The testing scope of this panel includes these medications:  Cyclobenzaprine  Hydrocodone  (Hydrocodone-Acetaminophen)  Metoprolol  **Note: The testing scope of this panel does not include small to  moderate amounts of these reported medications:  Acetaminophen (Hydrocodone-Acetaminophen)  Aspirin  **Note: The testing scope of this panel does not include following  reported medications:  Cholecalciferol  Estradiol  Fluticasone  Hydrochlorothiazide  Hydrochlorothiazide (Valsartan-Hydrochlorothizide)  Meloxicam  Multivitamin  Pravastatin  Valsartan (Valsartan-Hydrochlorothizide) ==================================================================== For clinical consultation, please call [phone]. ====================================================================    List of UDS test(s) done: Lab Results  Component Value Date   SUMMARY FINAL 07/02/2015   UDS interpretation: Unexpected findings not considered significantly abnormal          Medication Assessment Form: Reviewed. Patient indicates being compliant with therapy Treatment compliance: Compliant Risk  Assessment: Aberrant Behavior: None observed today Substance Use Disorder (SUD) Risk Level: Low Risk of opioid abuse or dependence: 0.7-3.0% with doses ? 36 MME/day and 6.1-26% with doses ? 120 MME/day. Opioid Risk Tool (ORT) Score: Total Score: 0 Low Risk for SUD (Score <3) Depression Scale Score: PHQ-2: PHQ-2 Total Score: 0 No depression (0) PHQ-9: PHQ-9 Total Score: 0 No depression (0-4)  Pharmacologic Plan: No change in therapy, at this time  Post-Procedure Assessment  Procedure done on last visit: Left T5-6 thoracic epidural steroid injection under fluoroscopic guidance, no sedation. Side-effects or Adverse reactions: None reported Sedation: No sedation used  Results: Ultra-Short Term Relief (First 1 hour after procedure): 90 %  Analgesia during this period is likely to be Local Anesthetic and/or IV Sedative (Analgesic/Anxiolitic) related Short Term Relief (Initial 4-6 hrs after procedure): 90  % Complete relief confirms area to be the source of pain Long Term Relief : 100 % (for abput 10 days) Long-term benefit would suggest an inflammatory etiology to the pain   Current Relief (Now): 0%  Recurrance of pain could suggest persistent aggravating factors Interpretation of Results: Clearly the patient has been getting some benefit from this type of therapy however the pain continues to return suggesting persistent irritation to the thoracic nerve roots. We'll plan to complete the third injection and if the pain continues to return we'll get a neurosurgical consult for further recommendations.  Laboratory Chemistry  Inflammation Markers Lab Results  Component Value Date   ESRSEDRATE 39* 07/03/2015   CRP 1.9* 07/03/2015    Renal Function Lab Results  Component Value Date   BUN 13 04/12/2015   CREATININE 0.79 04/12/2015   GFRAA >60 04/12/2015   GFRNONAA >60 04/12/2015    Hepatic Function Lab Results  Component Value Date   AST 17 04/12/2015   ALT 12* 04/12/2015   ALBUMIN 3.8 04/12/2015    Electrolytes Lab Results  Component Value Date   NA 135 04/12/2015   K 3.5 04/12/2015   CL 94* 04/12/2015   CALCIUM 10.1 04/12/2015   MG 1.5* 07/03/2015    Pain Modulating Vitamins Lab Results  Component Value Date   VD25OH 43.1 07/03/2015    Coagulation Parameters Lab Results  Component Value Date   PLT 294 04/12/2015    Note: Labs Reviewed. Results made available to patient.  Recent Diagnostic Imaging  Cervical Imaging: Cervical MR wo contrast:  Results for orders placed in visit on 07/10/08  MR C Spine Ltd W/O Cm   Narrative * PRIOR REPORT IMPORTED FROM AN EXTERNAL SYSTEM *   PRIOR REPORT IMPORTED FROM THE [facility] WORKFLOW SYSTEM   REASON FOR EXAM:    Neck Pain  COMMENTS:  [id number]   PROCEDURE:     MR  - MR CERVICAL SPINE WO CONT  - Jul 10 2008  8:08AM   RESULT:     MRI cervical spine   Comparison: None   Indication: Neck pain. Left-sided  radiculopathy.   Technique: Multiplanar and multisequence MR imaging of the cervical spine  was performed without contrast.   Findings:   The cervical cord is normal in size and signal. The cervical spine is  normal  in lordotic alignment, without listhesis. Bone marrow signal is normal.  Cerebellar tonsils are normal in position. Vertebral body heights are  maintained.   C2-3: No significant disc bulge, central canal stenosis, or foraminal  narrowing.   C3-4:  There is a focal small central disc protrusion impressing on the  ventral thecal sac and mildly deforming the ventral spinal cord. There are  left uncovertebral degenerative changes. There is no foraminal narrowing.   C4-5: Small central disc bulge with mild impression on the ventral thecal  sac. No foraminal stenosis.   C5-6:  Moderate sized central disc protrusion impressing on the ventral  thecal sac and mildly deforming the ventral cervical spinal cord. No  foraminal stenosis.   C6-7:  Moderate-sized broad-based disc bulge effacing the ventral thecal  sac  and abutting the ventral cervical spinal cord. There is mild bilateral  foraminal narrowing.   C7-T1:  No significant disc bulge, central canal stenosis, or foraminal  narrowing.   T1-T2: There is a small central disc bulge with mild impression on the  ventral thecal sac on the sagittal images. This area is not included on  the  axial images.   IMPRESSION:  1. Cervical spondylosis as described above.       Thoracic Imaging: Thoracic MR wo contrast:  Results for orders placed during the hospital encounter of 04/29/15  MR Thoracic Spine Wo Contrast   Narrative CLINICAL DATA:  80 year old female with thoracic spine pain radiating round rib cage for 6 months. Thoracic radiculitis. Initial encounter.  EXAM: MRI THORACIC SPINE WITHOUT CONTRAST  TECHNIQUE: Multiplanar, multisequence MR imaging of the thoracic spine was performed. No intravenous contrast  was administered.  COMPARISON:  Chest CT 02/14/2015.  Cervical spine MRI 07/10/2008.  FINDINGS: Limited sagittal imaging of the cervical spine remarkable for interval ACDF placement at C5-C6 and C6-C7.  Stable lumbar vertebral height and alignment since November. 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 No marrow edema or evidence of acute osseous abnormality.  Aberrant origin of the right subclavian artery, normal anatomic variant. Stable visualized thoracic and upper abdominal viscera. Posterior Thoracic paraspinal soft tissues appear within normal limits. No posterior rib abnormality identified.  There are partially visible postoperative changes to the upper lumbar spine posterior soft tissues at L2.  T1-T2: Mild disc bulge. Mild to moderate facet hypertrophy. No spinal stenosis. Mild to moderate T1 foraminal stenosis greater on the left.  T2-T3: Mild disc bulge. Mild facet hypertrophy. Mild to moderate T2 foraminal stenosis greater on the right.  T3-T4: Mild disc bulge. Mild to moderate facet hypertrophy. Mild to moderate T3 foraminal stenosis, greater on the left.  T4-T5: Congenital incomplete segmentation.  Otherwise negative.  T5-T6: Mild left eccentric disc bulge. Moderate to severe facet hypertrophy. Moderate to severe left and mild-to-moderate right T5 foraminal stenosis.  T6-T7: Mild disc bulge. Mild facet hypertrophy greater on the left. Mild to moderate left T6 foraminal stenosis.  T7-T8: Mild disc bulge. Moderate facet and ligament flavum hypertrophy. Mild spinal stenosis (series 7, image 20). No spinal cord mass effect. Mild to moderate right T7 foraminal stenosis.  T8-T9: Circumferential disc bulge with superimposed small central disc protrusion best seen on series 8, image 23. Mild to moderate facet and ligament flavum hypertrophy.  Mild spinal stenosis (series 7, image 23), mild if any ventral cord mass effect. Mild left T8 foraminal stenosis.  T9-T10: Circumferential disc bulge. Moderate to severe facet and ligament flavum hypertrophy. Mild spinal stenosis (series 7, image 25). No cord mass effect. Moderate to severe bilateral T9 foraminal stenosis.  T10-T11: Mild right eccentric disc bulge. Mild to moderate facet and ligament flavum hypertrophy. Somewhat dysplastic appearance of the right posterior elements, confirmed on the comparison CT and  with posterior element ankylosis on the right (Series 4, image 4). Borderline to mild spinal stenosis. No spinal cord mass effect. Mild left T10 foraminal stenosis.  T11-T12: Anterior eccentric circumferential disc bulge. Moderate to severe facet and ligament flavum hypertrophy. Mild spinal stenosis (series 7, image 32). No spinal cord mass effect. Moderate to severe left T11 foraminal stenosis. Mild right T11 foraminal stenosis.  T12-L1: Circumferential disc bulge with superimposed broad-based left paracentral disc protrusion best seen on series 7, image 35. Mild facet and ligament flavum hypertrophy. Mild spinal stenosis, with up to mild spinal cord mass effect (series 7, image 35). No foraminal stenosis.  No thoracic spinal cord signal abnormality. Conus medullaris occurs below the L1-L2 level.  IMPRESSION: 1. 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 Subsequent moderate to severe adjacent segment facet arthropathy. 2. Widespread thoracic spine disc bulging. Occasional small superimposed disc protrusions (T8-T9, T11-T12). 3. Subsequent multifactorial mild thoracic spinal stenosis T7-T8, T8-T9, T9-T10, T10-T11, T11-T12, and T12-L1. Up to mild spinal cord mass effect at T8-T9 and T12-L1, no thoracic spinal cord signal abnormality. 4. Multifactorial moderate or severe neural foraminal stenosis at the right T5,  bilateral T9 and left T11 nerve levels. 5. Lower cervical spine ACDF, new since 2010.   Electronically Signed   By: Genevie Ann M.D.   On: 04/29/2015 14:04    Lumbosacral Imaging: Lumbar MR wo contrast:  Results for orders placed in visit on 05/01/13  MR L Spine Ltd W/O Cm   Narrative * PRIOR REPORT IMPORTED FROM AN EXTERNAL SYSTEM *   CLINICAL DATA:  Low back and bilateral hip pain, worse on the right.  History of back surgery in 2005 and 2006. No known injury.   EXAM:  MRI LUMBAR SPINE WITHOUT CONTRAST   TECHNIQUE:  Multiplanar, multisequence MR imaging was performed. No intravenous  contrast was administered.   COMPARISON:  CT ABD-PELV W/ CM dated 08/28/2011; MR LUMBAR SPINE WO/W  CM dated 06/30/2004; DG ABDOMEN 1V dated 10/26/2007   FINDINGS:  There is transitional lumbosacral anatomy with 6 non rib-bearing  lumbar type vertebral bodies on CT. In keeping with the numbering  utilized for the prior MRI, the lowest disc space is assigned L5-S1.  Patient has undergone interval laminectomy and PLIF at L4-5. The  overall alignment is stable. There is no evidence of fracture or  pars defect.   The conus medullaris extends to the L2 level and appears normal.  Retroperitoneal susceptibility artifact appears postoperative. No  acute paraspinal abnormalities are identified.   T11-12: There is a chronic left paracentral disc protrusion with  associated cord flattening, similar to the prior study. No foraminal  compromise identified.   T12-L1: Stable mild disc bulging. No spinal stenosis or nerve root  encroachment.   L1-2: Mildly progressive disc bulging with paraspinal osteophytes.  No significant spinal stenosis or nerve root encroachment.   L2-3: There is progressive annular disc bulging with a broad-based  posterolateral and foraminal disc protrusion on the left. There is  mildly progressive facet and ligamentous hypertrophy. Prior CT  demonstrated vacuum phenomenon  extending medially from the left  facet joint. These factors contribute to mild central and  mild-to-moderate left lateral recess and left foraminal stenosis.  There is potential encroachment on the left L2 and L3 nerve root.   L3-4: Adjacent segment disease with annular disc bulging and  moderate facet/ ligamentous hypertrophy. There is resulting moderate  central stenosis with mild lateral recess and  foraminal stenosis  bilaterally.   L4-5: Since the prior MRI, the patient has undergone laminectomy and  PLIF. The spinal canal and foramina appear adequately decompressed.   L5-S1: There are probable remote postsurgical changes at this level.  The disc height and hydration are maintained. There is mild  bilateral facet hypertrophy. No spinal stenosis or nerve root  encroachment.   IMPRESSION:  1. Transitional lumbosacral anatomy. The last open disc space is  assigned L5-S1. By this numbering, the patient has undergone  previous fusion at L4-5.  2. The spinal canal and neural foramina are well decompressed at the  operative level.  3. Adjacent segment disease at L3-4 with resulting moderate  multifactorial spinal stenosis.  4. Broad-based posterolateral and foraminal disc protrusion on the  left at L2-3 contributing to mild central and asymmetric left  lateral recess and left foraminal stenosis.  5. Chronic left paracentral disc protrusion at T11-12 with  associated cord deformity, unchanged.    Electronically Signed    By: Camie Patience M.D.    On: 05/01/2013 10:34       Lumbar MR w/wo contrast:  Results for orders placed in visit on 06/30/04  MR Lumbar Spine W Wo Contrast   Narrative * PRIOR REPORT IMPORTED FROM AN EXTERNAL SYSTEM *   PRIOR REPORT IMPORTED FROM THE [facility] WORKFLOW SYSTEM   REASON FOR EXAM:  back pain  COMMENTS:   PROCEDURE:     MR  - MR LUMBAR SPINE WO/W  - Jun 30 2004 12:30PM   RESULT:        Comparison is made to a prior study dated 08/24/03.   Multiplanar/multisequence imaging of the lumbar spine was obtained without  the administration of gadolinium.  The conus medullaris terminates at an  L2-3 level.   The cauda equina demonstrate no evidence of clumping nor  thickening.  A focal disc protrusion is once again appreciated at the  T11-12  level.  This causes central and paracentral mass effect upon the thecal  sac  and cord.  There appears to be mass effect upon the anterior and lateral  cord as well as possible encroachment upon the exiting nerve root at this  level.  When compared to the previous study, this appears unchanged.  Broad-based annular bulge is appreciated at the L1-2 level which causes  partial effacement of the anterior CSF space.  There is mild to minimal  stenosis of the thecal sac which when compared to the previous study  appears  unchanged.  A broad-based annular bulge is also appreciated at the L2-3  level which causes partial effacement of the anterior CSF space.  There  does  not appear to be evidence of nerve root compression or compromise.  Thecal  sac stenosis at this level is mild to moderate.  The finding at  L2-3 appears to have developed in the interim when compared to the  previous  study.  Multifocal thecal sac stenosis is once again appreciated at the  L4-5  level which when compared to the previous study has also increased. There  appears to be increased osteophytosis involving the uncovertebral joints  as  well as increased hypertrophy of the facets.  Hypertrophy of the  ligamentum  flavum is also appreciated which appears to be increased.  The thecal sac  stenosis at this level has increased.  No further regions of new disc  herniations, protrusions or bulges are appreciated.   IMPRESSION:   1.     Stable  disc protrusion at T11-12 when compared to previous study.  2.     Interval development of mild disc protrusions at L1-2 and L2-3 with  mild to minimal thecal sac stenosis.  3.      Increased thecal sac stenosis at the L4-5 level which now appears  to  be moderate to severe.  There has been increase in the multifactorial  etiologies of the stenosis with increased degenerative change at this  level.       Reviewed.  Meds  The patient has a current medication list which includes the following prescription(s): aspirin ec, calcium citrate-vitamin d, cholecalciferol, clobetasol ointment, estradiol, fluticasone, hydrocodone-acetaminophen, meloxicam, metoprolol succinate, multiple vitamins-minerals, pravastatin, and valsartan-hydrochlorothiazide.  Current Outpatient Prescriptions on File Prior to Visit  Medication Sig  . aspirin EC 81 MG tablet Take 81 mg by mouth.  . calcium citrate-vitamin D (CITRACAL+D) 315-200 MG-UNIT tablet Take by mouth.  . Cholecalciferol (VITAMIN D-1000 MAX ST) 1000 units tablet Take by mouth.  . clobetasol ointment (TEMOVATE) 0.05 % APPLY TWICE DAILY TO SORE AREAS IN MOUTH UNTIL HEALED. AVOID FACE AND SKIN FOLDS.  Marland Kitchen estradiol (ESTRACE) 1 MG tablet Take 1 mg by mouth daily.   . fluticasone (FLONASE) 50 MCG/ACT nasal spray USE 2 PUFFS EACH NOSTRIL AT BEDTIME as needed  . HYDROcodone-acetaminophen (NORCO/VICODIN) 5-325 MG tablet Take 1 tablet by mouth 2 (two) times daily as needed for severe pain.  . meloxicam (MOBIC) 15 MG tablet TAKE 1 TABLET (15 MG TOTAL) BY MOUTH ONCE DAILY.  . metoprolol succinate (TOPROL-XL) 50 MG 24 hr tablet Take 50 mg by mouth daily.  . Multiple Vitamins-Minerals (OCUVITE EXTRA PO) Take 1 tablet by mouth daily.   . pravastatin (PRAVACHOL) 80 MG tablet Take 80 mg by mouth at bedtime.  . valsartan-hydrochlorothiazide (DIOVAN-HCT) 160-12.5 MG per tablet Take 1 tablet by mouth daily.   No current facility-administered medications on file prior to visit.    ROS  Constitutional: Denies any fever or chills Gastrointestinal: No reported hemesis, hematochezia, vomiting, or acute GI distress Musculoskeletal: Denies any acute  onset joint swelling, redness, loss of ROM, or weakness Neurological: No reported episodes of acute onset apraxia, aphasia, dysarthria, agnosia, amnesia, paralysis, loss of coordination, or loss of consciousness  Allergies  Ms. Wares is allergic to atorvastatin and rosuvastatin.  [place]  Medical:  Ms. Kunst  has a past medical history of [place] esophagus; Hypertension; Hyperlipidemia; GERD (gastroesophageal reflux disease); and Allergy. Family: family history includes Arthritis in her mother; Asthma in her father; Diabetes in her father and mother; Heart disease in her father and mother; Hypertension in her father and mother; Stroke in her father. There is no history of Breast cancer. Surgical:  has past surgical history that includes Cholecystectomy; Abdominal hysterectomy; Back surgery; Appendectomy; Tonsilectomy, adenoidectomy, bilateral myringotomy and tubes; and Cardiac catheterization (N/A, 02/07/2015). Tobacco:  reports that she has never smoked. She has never used smokeless tobacco. Alcohol:  reports that she does not drink alcohol. Drug:  reports that she does not use illicit drugs.  Constitutional Exam  Vitals: Blood pressure 128/46, pulse 89, temperature 97.9 F (36.6 C), temperature source Oral, resp. rate 24, height 5\' 3"  (1.6 m), weight 169 lb (76.658 kg), SpO2 95 %. General appearance: Well nourished, well developed, and well hydrated. In no acute distress Calculated BMI/Body habitus: Body mass index is 29.94 kg/(m^2). (25-29.9 kg/m2) Overweight - 20% higher incidence of chronic pain Psych/Mental status: Alert and oriented x 3 (person, place, & time) Eyes: PERLA Respiratory: No  evidence of acute respiratory distress  Cervical Spine Exam  Inspection: No masses, redness, or swelling Alignment: Symmetrical ROM: Functional: ROM is within functional limits [facility]) Stability: No instability detected Muscle strength & Tone: Functionally intact Sensory: Unimpaired Palpation:  No complaints of tenderness  Upper Extremity (UE) Exam    Side: Right upper extremity  Side: Left upper extremity  Inspection: No masses, redness, swelling, or asymmetry  Inspection: No masses, redness, swelling, or asymmetry  ROM:  ROM:  Functional: ROM is within functional limits [facility])  Functional: ROM is within functional limits [facility])  Muscle strength & Tone: Functionally intact  Muscle strength & Tone: Functionally intact  Sensory: Unimpaired  Sensory: Unimpaired  Palpation: Non-contributory  Palpation: Non-contributory   Thoracic Spine Exam  Inspection: No masses, redness, or swelling Alignment: Symmetrical ROM: Functional: Limited ROM Stability: No instability detected Sensory: Unimpaired Muscle strength & Tone: Functionally intact Palpation: Tender  Lumbar Spine Exam  Inspection: No masses, redness, or swelling Alignment: Symmetrical ROM: Functional: Limited ROM Stability: No instability detected Muscle strength & Tone: Functionally intact Sensory: Unimpaired Palpation: Tender Provocative Tests: Lumbar Hyperextension and rotation test: deferred Patrick's Maneuver: deferred  Gait & Posture Assessment  Ambulation: Unassisted Gait: Antalgic Posture: Difficulty with positional changes  Lower Extremity Exam    Side: Right lower extremity  Side: Left lower extremity  Inspection: No masses, redness, swelling, or asymmetry ROM:  Inspection: No masses, redness, swelling, or asymmetry ROM:  Functional: ROM is within functional limits [facility])  Functional: ROM is within functional limits [facility])  Muscle strength & Tone: Functionally intact  Muscle strength & Tone: Functionally intact  Sensory: Unimpaired  Sensory: Unimpaired  Palpation: Non-contributory  Palpation: Non-contributory   Assessment & Plan  Primary Diagnosis & Pertinent Problem List: The primary encounter diagnosis was Thoracic central spinal stenosis (T7-8, T8-9, T9-10, T10-11, T11-12, and T12-L1). Diagnoses of  Thoracic spondylosis with radiculopathy (Left), Chronic pain, Long term current use of opiate analgesic, Encounter for therapeutic drug level monitoring, Thoracic foraminal stenosis (Moderate to severe at: Right T5; Bilateral T9; Left T11), Status post lumbar spine operation, and Nerve root pain were also pertinent to this visit.  Visit Diagnosis: 1. Thoracic central spinal stenosis (T7-8, T8-9, T9-10, T10-11, T11-12, and T12-L1)   2. Thoracic spondylosis with radiculopathy (Left)   3. Chronic pain   4. Long term current use of opiate analgesic   5. Encounter for therapeutic drug level monitoring   6. Thoracic foraminal stenosis (Moderate to severe at: Right T5; Bilateral T9; Left T11)   7. Status post lumbar spine operation   8. Nerve root pain     Problems updated and reviewed during this visit: Problem  Postoperative Back Pain  Nerve Root Pain   Thoracic pain is likely to be secondary to severe left T5 foraminal stenosis.   Spinal Stenosis of Thoracic Region  Status Post Lumbar Spine Operation   Pedicle screws and posterior hardware at L5 L4-L3 and L2. Neil disc block at the L4-5 disc space. Slight anterior subluxation of L4 on L5. Different configuration and this had L3-4. Degenerative changes of the lumbar disc spaces. L5-S1 disc space well-maintained .     Problem-specific Plan(s): No problem-specific assessment & plan notes found for this encounter.  No new assessment & plan notes have been filed under this hospital service since the last note was generated. Service: Pain Management   Plan of Care   Problem List Items Addressed This Visit      High   Chronic pain (Chronic)  Nerve root pain   Status post lumbar spine operation   Thoracic central spinal stenosis (T7-8, T8-9, T9-10, T10-11, T11-12, and T12-L1) - Primary (Chronic)   Relevant Orders   THORACIC EPIDURAL STEROID INJECTION   THORACIC EPIDURAL STEROID INJECTION   Thoracic foraminal stenosis (Moderate to  severe at: Right T5; Bilateral T9; Left T11) (Chronic)   Thoracic spondylosis with radiculopathy (Left) (Chronic)   Relevant Orders   THORACIC EPIDURAL STEROID INJECTION   THORACIC EPIDURAL STEROID INJECTION     Medium   Encounter for therapeutic drug level monitoring   Relevant Orders   ToxASSURE Select 13 (MW), Urine   Long term current use of opiate analgesic (Chronic)   Relevant Orders   ToxASSURE Select 13 (MW), Urine       Pharmacotherapy (Medications Ordered): No orders of the defined types were placed in this encounter.    Lab-work & Procedure Ordered: Orders Placed This Encounter  Procedures  . THORACIC EPIDURAL STEROID INJECTION  . THORACIC EPIDURAL STEROID INJECTION  . ToxASSURE Select 13 (MW), Urine    Imaging Ordered: None  Interventional Therapies: Scheduled:  Left T5-6 thoracic epidural steroid injection #3 under fluoroscopic guidance, no sedation.    Considering:  Referral to neurosurgery in the event that the pain continues to return.    PRN Procedures:  Palliative Left T5-6 thoracic epidural steroid injection under fluoroscopic guidance, with or without sedation.    Referral(s) or Consult(s): None at this time.  New Prescriptions   No medications on file    Medications administered during this visit: Ms. Linen had no medications administered during this visit.  Requested PM Follow-up: Return in about 5 weeks (around 10/23/2015) for Procedure (Scheduled), Procedure (PRN - Patient will call), Medication Management, (1-Mo).  Future Appointments Date Time Provider [place]  10/04/2015 9:00 AM [facility]  10/28/2015 9:00 AM Milinda Pointer, MD [facility] None    Primary Care Physician: Idelle Crouch, MD Location: [facility] Note by: Kathlen Brunswick. Dossie Arbour, M.D, DABA, DABAPM, DABPM, DABIPP, FIPP  Pain Score Disclaimer: We use the NRS-11 scale. This is a self-reported, subjective measurement of  pain severity with only modest accuracy. It is used primarily to identify changes within a particular patient. It must be understood that outpatient pain scales are significantly less accurate that those used for research, where they can be applied under ideal controlled circumstances with minimal exposure to variables. In reality, the score is likely to be a combination of pain intensity and pain affect, where pain affect describes the degree of emotional arousal or changes in action readiness caused by the sensory experience of pain. Factors such as social and work situation, setting, emotional state, anxiety levels, expectation, and prior pain experience may influence pain perception and show large inter-individual differences that may also be affected by time variables.  Patient instructions provided at this appointment:: Patient Instructions   GENERAL RISKS AND COMPLICATIONS  What are the risk, side effects and possible complications? Generally speaking, most procedures are safe.  However, with any procedure there are risks, side effects, and the possibility of complications.  The risks and complications are dependent upon the sites that are lesioned, or the type of nerve block to be performed.  The closer the procedure is to the spine, the more serious the risks are.  Great care is taken when placing the radio frequency needles, block needles or lesioning probes, but sometimes complications can occur. 1. Infection: Any time there is an injection through the  skin, there is a risk of infection.  This is why sterile conditions are used for these blocks.  There are four possible types of infection. 1. Localized skin infection. 2. Central Nervous System Infection-This can be in the form of Meningitis, which can be deadly. 3. Epidural Infections-This can be in the form of an epidural abscess, which can cause pressure inside of the spine, causing compression of the spinal cord with subsequent paralysis.  This would require an emergency surgery to decompress, and there are no guarantees that the patient would recover from the paralysis. 4. Discitis-This is an infection of the intervertebral discs.  It occurs in about 1% of discography procedures.  It is difficult to treat and it may lead to surgery.        2. Pain: the needles have to go through skin and soft tissues, will cause soreness.       3. Damage to internal structures:  The nerves to be lesioned may be near blood vessels or    other nerves which can be potentially damaged.       4. Bleeding: Bleeding is more common if the patient is taking blood thinners such as  aspirin, Coumadin, Ticiid, Plavix, etc., or if he/she have some genetic predisposition  such as hemophilia. Bleeding into the spinal canal can cause compression of the spinal  cord with subsequent paralysis.  This would require an emergency surgery to  decompress and there are no guarantees that the patient would recover from the  paralysis.       5. Pneumothorax:  Puncturing of a lung is a possibility, every time a needle is introduced in  the area of the chest or upper back.  Pneumothorax refers to free air around the  collapsed lung(s), inside of the thoracic cavity (chest cavity).  Another two possible  complications related to a similar event would include: Hemothorax and Chylothorax.   These are variations of the Pneumothorax, where instead of air around the collapsed  lung(s), you may have blood or chyle, respectively.       6. Spinal headaches: They may occur with any procedures in the area of the spine.       7. Persistent CSF (Cerebro-Spinal Fluid) leakage: This is a rare problem, but may occur  with prolonged intrathecal or epidural catheters either due to the formation of a fistulous  track or a dural tear.       8. Nerve damage: By working so close to the spinal cord, there is always a possibility of  nerve damage, which could be as serious as a permanent spinal cord injury  with  paralysis.       9. Death:  Although rare, severe deadly allergic reactions known as "Anaphylactic  reaction" can occur to any of the medications used.      10. Worsening of the symptoms:  We can always make thing worse.  What are the chances of something like this happening? Chances of any of this occuring are extremely low.  By statistics, you have more of a chance of getting killed in a motor vehicle accident: while driving to the hospital than any of the above occurring .  Nevertheless, you should be aware that they are possibilities.  In general, it is similar to taking a shower.  Everybody knows that you can slip, hit your head and get killed.  Does that mean that you should not shower again?  Nevertheless always keep in mind that statistics do not mean  anything if you happen to be on the wrong side of them.  Even if a procedure has a 1 (one) in a 1,000,000 (million) chance of going wrong, it you happen to be that one..Also, keep in mind that by statistics, you have more of a chance of having something go wrong when taking medications.  Who should not have this procedure? If you are on a blood thinning medication (e.g. Coumadin, Plavix, see list of "Blood Thinners"), or if you have an active infection going on, you should not have the procedure.  If you are taking any blood thinners, please inform your physician.  How should I prepare for this procedure?  Do not eat or drink anything at least six hours prior to the procedure.  Bring a driver with you .  It cannot be a taxi.  Come accompanied by an adult that can drive you back, and that is strong enough to help you if your legs get weak or numb from the local anesthetic.  Take all of your medicines the morning of the procedure with just enough water to swallow them.  If you have diabetes, make sure that you are scheduled to have your procedure done first thing in the morning, whenever possible.  If you have diabetes, take only half  of your insulin dose and notify our nurse that you have done so as soon as you arrive at the clinic.  If you are diabetic, but only take blood sugar pills (oral hypoglycemic), then do not take them on the morning of your procedure.  You may take them after you have had the procedure.  Do not take aspirin or any aspirin-containing medications, at least eleven (11) days prior to the procedure.  They may prolong bleeding.  Wear loose fitting clothing that may be easy to take off and that you would not mind if it got stained with Betadine or blood.  Do not wear any jewelry or perfume  Remove any nail coloring.  It will interfere with some of our monitoring equipment.  NOTE: Remember that this is not meant to be interpreted as a complete list of all possible complications.  Unforeseen problems may occur.  BLOOD THINNERS The following drugs contain aspirin or other products, which can cause increased bleeding during surgery and should not be taken for 2 weeks prior to and 1 week after surgery.  If you should need take something for relief of minor pain, you may take acetaminophen which is found in Tylenol,m Datril, Anacin-3 and Panadol. It is not blood thinner. The products listed below are.  Do not take any of the products listed below in addition to any listed on your instruction sheet.  A.P.C or A.P.C with Codeine Codeine Phosphate Capsules #3 Ibuprofen Ridaura  ABC compound Congesprin Imuran rimadil  Advil Cope Indocin Robaxisal  Alka-Seltzer Effervescent Pain Reliever and Antacid Coricidin or Coricidin-D  Indomethacin Rufen  Alka-Seltzer plus Cold Medicine Cosprin Ketoprofen S-A-C Tablets  Anacin Analgesic Tablets or Capsules Coumadin Korlgesic Salflex  Anacin Extra Strength Analgesic tablets or capsules CP-2 Tablets Lanoril Salicylate  Anaprox Cuprimine Capsules Levenox Salocol  Anexsia-D Dalteparin Magan Salsalate  Anodynos Darvon compound Magnesium Salicylate Sine-off  Ansaid Dasin  Capsules Magsal Sodium Salicylate  Anturane Depen Capsules Marnal Soma  APF Arthritis pain formula Dewitt's Pills Measurin Stanback  Argesic Dia-Gesic Meclofenamic Sulfinpyrazone  Arthritis Bayer Timed Release Aspirin Diclofenac Meclomen Sulindac  Arthritis pain formula Anacin Dicumarol Medipren Supac  Analgesic (Safety coated) Arthralgen Diffunasal Mefanamic Suprofen  Arthritis  Strength Bufferin Dihydrocodeine Mepro Compound Suprol  Arthropan liquid Dopirydamole Methcarbomol with Aspirin Synalgos  ASA tablets/Enseals Disalcid Micrainin Tagament  Ascriptin Doan's Midol Talwin  Ascriptin A/D Dolene Mobidin Tanderil  Ascriptin Extra Strength Dolobid Moblgesic Ticlid  Ascriptin with Codeine Doloprin or Doloprin with Codeine Momentum Tolectin  Asperbuf Duoprin Mono-gesic Trendar  Aspergum Duradyne Motrin or Motrin IB Triminicin  Aspirin plain, buffered or enteric coated Durasal Myochrisine Trigesic  Aspirin Suppositories Easprin Nalfon Trillsate  Aspirin with Codeine Ecotrin Regular or Extra Strength Naprosyn Uracel  Atromid-S Efficin Naproxen Ursinus  Auranofin Capsules Elmiron Neocylate Vanquish  Axotal Emagrin Norgesic Verin  Azathioprine Empirin or Empirin with Codeine Normiflo Vitamin E  Azolid Emprazil Nuprin Voltaren  Bayer Aspirin plain, buffered or children's or timed BC Tablets or powders Encaprin Orgaran Warfarin Sodium  Buff-a-Comp Enoxaparin Orudis Zorpin  Buff-a-Comp with Codeine Equegesic Os-Cal-Gesic   Buffaprin Excedrin plain, buffered or Extra Strength Oxalid   Bufferin Arthritis Strength Feldene Oxphenbutazone   Bufferin plain or Extra Strength Feldene Capsules Oxycodone with Aspirin   Bufferin with Codeine Fenoprofen Fenoprofen Pabalate or Pabalate-SF   Buffets II Flogesic Panagesic   Buffinol plain or Extra Strength Florinal or Florinal with Codeine Panwarfarin   Buf-Tabs Flurbiprofen Penicillamine   Butalbital Compound Four-way cold tablets Penicillin    Butazolidin Fragmin Pepto-Bismol   Carbenicillin Geminisyn Percodan   Carna Arthritis Reliever Geopen Persantine   Carprofen Gold's salt Persistin   Chloramphenicol Goody's Phenylbutazone   Chloromycetin Haltrain Piroxlcam   Clmetidine heparin Plaquenil   Cllnoril Hyco-pap Ponstel   Clofibrate Hydroxy chloroquine Propoxyphen         Before stopping any of these medications, be sure to consult the physician who ordered them.  Some, such as Coumadin (Warfarin) are ordered to prevent or treat serious conditions such as "deep thrombosis", "pumonary embolisms", and other heart problems.  The amount of time that you may need off of the medication may also vary with the medication and the reason for which you were taking it.  If you are taking any of these medications, please make sure you notify your pain physician before you undergo any procedures.         Epidural Steroid Injection Patient Information  Description: The epidural space surrounds the nerves as they exit the spinal cord.  In some patients, the nerves can be compressed and inflamed by a bulging disc or a tight spinal canal (spinal stenosis).  By injecting steroids into the epidural space, we can bring irritated nerves into direct contact with a potentially helpful medication.  These steroids act directly on the irritated nerves and can reduce swelling and inflammation which often leads to decreased pain.  Epidural steroids may be injected anywhere along the spine and from the neck to the low back depending upon the location of your pain.   After numbing the skin with local anesthetic (like Novocaine), a small needle is passed into the epidural space slowly.  You may experience a sensation of pressure while this is being done.  The entire block usually last less than 10 minutes.  Conditions which may be treated by epidural steroids:   Low back and leg pain  Neck and arm pain  Spinal stenosis  Post-laminectomy  syndrome  Herpes zoster (shingles) pain  Pain from compression fractures  Preparation for the injection:  1. Do not eat any solid food or dairy products within 8 hours of your appointment.  2. You may drink clear liquids up to 3 hours before appointment.  Clear liquids include water, black coffee, juice or soda.  No milk or cream please. 3. You may take your regular medication, including pain medications, with a sip of water before your appointment  Diabetics should hold regular insulin (if taken separately) and take 1/2 normal NPH dos the morning of the procedure.  Carry some sugar containing items with you to your appointment. 4. A driver must accompany you and be prepared to drive you home after your procedure.  5. Bring all your current medications with your. 6. An IV may be inserted and sedation may be given at the discretion of the physician.   7. A blood pressure cuff, EKG and other monitors will often be applied during the procedure.  Some patients may need to have extra oxygen administered for a short period. 8. You will be asked to provide medical information, including your allergies, prior to the procedure.  We must know immediately if you are taking blood thinners (like Coumadin/Warfarin)  Or if you are allergic to IV iodine contrast (dye). We must know if you could possible be pregnant.  Possible side-effects:  Bleeding from needle site  Infection (rare, may require surgery)  Nerve injury (rare)  Numbness & tingling (temporary)  Difficulty urinating (rare, temporary)  Spinal headache ( a headache worse with upright posture)  Light -headedness (temporary)  Pain at injection site (several days)  Decreased blood pressure (temporary)  Weakness in arm/leg (temporary)  Pressure sensation in back/neck (temporary)  Call if you experience:  Fever/chills associated with headache or increased back/neck pain.  Headache worsened by an upright position.  New onset  weakness or numbness of an extremity below the injection site  Hives or difficulty breathing (go to the emergency room)  Inflammation or drainage at the infection site  Severe back/neck pain  Any new symptoms which are concerning to you  Please note:  Although the local anesthetic injected can often make your back or neck feel good for several hours after the injection, the pain will likely return.  It takes 3-7 days for steroids to work in the epidural space.  You may not notice any pain relief for at least that one week.  If effective, we will often do a series of three injections spaced 3-6 weeks apart to maximally decrease your pain.  After the initial series, we generally will wait several months before considering a repeat injection of the same type.  If you have any questions, please call [phone] [facility]

## 2015-09-18 NOTE — Progress Notes (Signed)
Safety precautions to be maintained throughout the outpatient stay will include: orient to surroundings, keep bed in low position, maintain call bell within reach at all times, provide assistance with transfer out of bed and ambulation.  Pateint did not bring medication bottles today. States forgot.

## 2015-09-26 LAB — TOXASSURE SELECT 13 (MW), URINE: PDF: 0

## 2015-10-04 ENCOUNTER — Ambulatory Visit
Admission: RE | Admit: 2015-10-04 | Discharge: 2015-10-04 | Disposition: A | Discharge status: Home / Self Care | Payer: Medicare Other | Source: Ambulatory Visit | Attending: Internal Medicine | Admitting: Internal Medicine

## 2015-10-04 DIAGNOSIS — R55 Syncope and collapse: Secondary | ICD-10-CM | POA: Insufficient documentation

## 2015-10-04 DIAGNOSIS — I739 Peripheral vascular disease, unspecified: Secondary | ICD-10-CM | POA: Diagnosis not present

## 2015-10-04 MED ORDER — GADOBENATE DIMEGLUMINE 529 MG/ML IV SOLN
15.0000 mL | Freq: Once | INTRAVENOUS | Status: AC | PRN
  Administered 2015-10-04: 15 mL via INTRAVENOUS

## 2015-10-28 ENCOUNTER — Encounter: Payer: Medicare Other | Admitting: Pain Medicine

## 2015-11-06 ENCOUNTER — Encounter: Payer: Self-pay | Admitting: Pain Medicine

## 2015-11-06 ENCOUNTER — Ambulatory Visit: Payer: Medicare Other | Attending: Pain Medicine | Admitting: Pain Medicine

## 2015-11-06 VITALS — BP 162/77 | HR 66 | Temp 97.7°F | Resp 16 | Ht 63.0 in | Wt 169.0 lb

## 2015-11-06 DIAGNOSIS — M8580 Other specified disorders of bone density and structure, unspecified site: Secondary | ICD-10-CM | POA: Insufficient documentation

## 2015-11-06 DIAGNOSIS — F1190 Opioid use, unspecified, uncomplicated: Secondary | ICD-10-CM

## 2015-11-06 DIAGNOSIS — G8929 Other chronic pain: Secondary | ICD-10-CM | POA: Insufficient documentation

## 2015-11-06 DIAGNOSIS — R51 Headache: Secondary | ICD-10-CM | POA: Insufficient documentation

## 2015-11-06 DIAGNOSIS — R0789 Other chest pain: Secondary | ICD-10-CM | POA: Diagnosis not present

## 2015-11-06 DIAGNOSIS — M4310 Spondylolisthesis, site unspecified: Secondary | ICD-10-CM | POA: Diagnosis not present

## 2015-11-06 DIAGNOSIS — K219 Gastro-esophageal reflux disease without esophagitis: Secondary | ICD-10-CM | POA: Diagnosis not present

## 2015-11-06 DIAGNOSIS — M546 Pain in thoracic spine: Secondary | ICD-10-CM | POA: Diagnosis present

## 2015-11-06 DIAGNOSIS — Z981 Arthrodesis status: Secondary | ICD-10-CM | POA: Insufficient documentation

## 2015-11-06 DIAGNOSIS — K5289 Other specified noninfective gastroenteritis and colitis: Secondary | ICD-10-CM | POA: Diagnosis not present

## 2015-11-06 DIAGNOSIS — I471 Supraventricular tachycardia: Secondary | ICD-10-CM | POA: Insufficient documentation

## 2015-11-06 DIAGNOSIS — M47894 Other spondylosis, thoracic region: Secondary | ICD-10-CM | POA: Insufficient documentation

## 2015-11-06 DIAGNOSIS — K589 Irritable bowel syndrome without diarrhea: Secondary | ICD-10-CM | POA: Insufficient documentation

## 2015-11-06 DIAGNOSIS — I739 Peripheral vascular disease, unspecified: Secondary | ICD-10-CM | POA: Diagnosis not present

## 2015-11-06 DIAGNOSIS — M5414 Radiculopathy, thoracic region: Secondary | ICD-10-CM | POA: Diagnosis not present

## 2015-11-06 DIAGNOSIS — I10 Essential (primary) hypertension: Secondary | ICD-10-CM | POA: Diagnosis not present

## 2015-11-06 DIAGNOSIS — Z5181 Encounter for therapeutic drug level monitoring: Secondary | ICD-10-CM | POA: Diagnosis not present

## 2015-11-06 DIAGNOSIS — Z79891 Long term (current) use of opiate analgesic: Secondary | ICD-10-CM | POA: Diagnosis not present

## 2015-11-06 DIAGNOSIS — E785 Hyperlipidemia, unspecified: Secondary | ICD-10-CM | POA: Insufficient documentation

## 2015-11-06 DIAGNOSIS — M4804 Spinal stenosis, thoracic region: Secondary | ICD-10-CM | POA: Insufficient documentation

## 2015-11-06 DIAGNOSIS — R079 Chest pain, unspecified: Secondary | ICD-10-CM | POA: Diagnosis present

## 2015-11-06 DIAGNOSIS — M5410 Radiculopathy, site unspecified: Secondary | ICD-10-CM

## 2015-11-06 MED ORDER — HYDROCODONE-ACETAMINOPHEN 5-325 MG PO TABS: 1.0000 | ORAL_TABLET | Freq: Two times a day (BID) | ORAL | 0 refills | Status: DC | PRN

## 2015-11-06 NOTE — Progress Notes (Signed)
Safety precautions to be maintained throughout the outpatient stay will include: orient to surroundings, keep bed in low position, maintain call bell within reach at all times, provide assistance with transfer out of bed and ambulation.   Norco 5/325mg  # 2 out of 60 remaining. Filled 09-07-15.  Cannot take Gabapentin, causes tremors.

## 2015-11-06 NOTE — Addendum Note (Signed)
Addended by: Milinda Pointer A on: 11/06/2015 03:56 PM   Modules accepted: Orders

## 2015-11-06 NOTE — Progress Notes (Addendum)
Patient's Name: Allison Mclean  Patient type: Established  MRN: [id number]  Service setting: Ambulatory outpatient  DOB: Mar 29, 1936  Location: [facility]  DOS: 11/06/2015  Primary Care Physician: Idelle Crouch, MD  Note by: Kathlen Brunswick. Dossie Arbour, M.D  Referring Physician: Idelle Crouch, MD  Specialty: Interventional Pain Management  Last Visit to Pain Management: 09/18/2015   Primary Reason(s) for Visit: Encounter for prescription drug management (Level of risk: moderate) CC: Back Pain (upper, between shoulder blades) and Chest Pain (bilateral)   HPI  Allison Mclean is a 80 y.o. year old, female patient, who returns today as an established patient. She has Acquired spondylolisthesis; Essential (primary) hypertension; Acid reflux; HLD (hyperlipidemia); Adaptive colitis; Supraventricular tachycardia ([place]); Cough; Atypical chest pain; Chronic pain; Long term current use of opiate analgesic; Long term prescription opiate use; Opiate use; Encounter for therapeutic drug level monitoring; Encounter for pain management planning; Osteopenia, senile; Abnormal MRI, thoracic spine; Thoracic central spinal stenosis (T7-8, T8-9, T9-10, T10-11, T11-12, and T12-L1); Thoracic foraminal stenosis (Moderate to severe at: Right T5; Bilateral T9; Left T11); Chronic thoracic radicular pain (T5/T6) (Left); History of lumbar fusion (12/19/2013) (posterior lumbar fusion from L2-L5); Failed back surgical syndrome; Thoracic facet syndrome (Left); History of fusion of cervical spine; Thoracic spondylosis with radiculopathy (Left); Hypomagnesemia; BP (high blood pressure); Postoperative back pain; Nerve root pain; Spinal stenosis of thoracic region; Status post lumbar spine operation; Musculoskeletal pain; and Neuropathic pain on her problem list.. Her primarily concern today is the Back Pain (upper, between shoulder blades) and Chest Pain (bilateral)   Pain Assessment: Self-Reported Pain Score: 4               Reported level is compatible with observation       Pain Type: Chronic pain Pain Location: Back Pain Orientation: Other (Comment), Upper Pain Descriptors / Indicators: Sore, Pressure Pain Frequency: Constant  The patient comes into the clinics today for pharmacological management of her chronic pain. I last saw this patient on 09/18/2015. The patient  reports that she does not use drugs. Her body mass index is 29.94 kg/m.  The patient complains that the gabapentin causes tremors and therefore she is not using it. The patient was seen by Dr. Vertell Limber for possible surgery. He referred her to PT first. She indicates that so far it has not helped.  Date of Last Visit: 09/18/15 Service Provided on Last Visit: Med Refill  Controlled Substance Pharmacotherapy Assessment & REMS (Risk Evaluation and Mitigation Strategy)  Analgesic: Hydrocodone/APAP 5/325 one every 6 hours when necessary (20 mg/day) MME/day: 20 mg/day.  Pill Count: Norco 5/325mg  # 2 out of 60 remaining. Filled 09-07-15. Pharmacokinetics: Onset of action (Liberation/Absorption): Within expected pharmacological parameters Time to Peak effect (Distribution): Timing and results are as within normal expected parameters Duration of action (Metabolism/Excretion): Within normal limits for medication Pharmacodynamics: Analgesic Effect: More than 50% Activity Facilitation: Medication(s) allow patient to sit, stand, walk, and do the basic ADLs Perceived Effectiveness: Described as relatively effective, allowing for increase in activities of daily living (ADL) Side-effects or Adverse reactions: None reported Monitoring: [place] PMP: Online review of the past 27-month period conducted. Compliant with practice rules and regulations Last UDS on record: Tox[employer]ure Select 13  Date Value Ref Range Status  09/18/2015 FINAL  Final    Comment:    ==================================================================== TOXASSURE SELECT 13  (MW) ==================================================================== Test  Result       Flag       Units Drug Present and Declared for Prescription Verification   Hydrocodone                    1172         EXPECTED   ng/mg creat   Hydromorphone                  394          EXPECTED   ng/mg creat   Dihydrocodeine                 65           EXPECTED   ng/mg creat   Norhydrocodone                 668          EXPECTED   ng/mg creat    Sources of hydrocodone include scheduled prescription    medications. Hydromorphone, dihydrocodeine and norhydrocodone are    expected metabolites of hydrocodone. Hydromorphone and    dihydrocodeine are also available as scheduled prescription    medications. ==================================================================== Test                      Result    Flag   Units      Ref Range   Creatinine              229              mg/dL      >=20 ==================================================================== Declared Medications:  The flagging and interpretation on this report are based on the  following declared medications.  Unexpected results may arise from  inaccuracies in the declared medications.  **Note: The testing scope of this panel includes these medications:  Hydrocodone (Hydrocodone-Acetaminophen)  **Note: The testing scope of this panel does not include following  reported medications:  Acetaminophen (Hydrocodone-Acetaminophen)  Aspirin  Calcium citrate (Calcium citrate/Vitamin D)  Cholecalciferol  Clobetasol  Estradiol  Fluticasone  Hydrochlorothiazide (Valsartan-Hydrochlorothizide)  Meloxicam  Metoprolol  Multivitamin  Pravastatin  Valsartan (Valsartan-Hydrochlorothizide)  Vitamin D (Calcium citrate/Vitamin D) ==================================================================== For clinical consultation, please call [phone]. ====================================================================    UDS interpretation: Compliant          Medication Assessment Form: Reviewed. Patient indicates being compliant with therapy Treatment compliance: Compliant Risk Assessment: Aberrant Behavior: None observed today Substance Use Disorder (SUD) Risk Level: Low-to-moderate Risk of opioid abuse or dependence: 0.7-3.0% with doses ? 36 MME/day and 6.1-26% with doses ? 120 MME/day. Opioid Risk Tool (ORT) Score:  0 Low Risk for SUD (Score <3) Depression Scale Score: PHQ-2: PHQ-2 Total Score: 0 No depression (0) PHQ-9: PHQ-9 Total Score: 0 No depression (0-4)  Pharmacologic Plan: She appears to be using a lot less medication than reported. She had the prescription filled on 08/30/2015 in today to my 8-17 she still had to her left. She indicates taking 1-2 tablets per day. Because she is not using the 60 tablets per month, we have decreased it to 50.  Laboratory Chemistry  Inflammation Markers Lab Results  Component Value Date   ESRSEDRATE 39 (H) 07/03/2015   CRP 1.9 (H) 07/03/2015    Renal Function Lab Results  Component Value Date   BUN 13 04/12/2015   CREATININE 0.79 04/12/2015   GFRAA >60 04/12/2015   GFRNONAA >60 04/12/2015    Hepatic Function Lab Results  Component Value  Date   AST 17 04/12/2015   ALT 12 (L) 04/12/2015   ALBUMIN 3.8 04/12/2015    Electrolytes Lab Results  Component Value Date   NA 135 04/12/2015   K 3.5 04/12/2015   CL 94 (L) 04/12/2015   CALCIUM 10.1 04/12/2015   MG 1.5 (L) 07/03/2015    Pain Modulating Vitamins Lab Results  Component Value Date   VD25OH 43.1 07/03/2015    Coagulation Parameters Lab Results  Component Value Date   PLT 294 04/12/2015    Cardiovascular Lab Results  Component Value Date   HGB 13.3 04/12/2015   HCT 39.4 04/12/2015    Note: Lab results reviewed.  Recent Diagnostic Imaging  Mr Jeri Cos Wo Contrast  Result Date:  10/04/2015 CLINICAL DATA:  Recurrent headache and near syncopal episodes. EXAM: MRI HEAD WITHOUT AND WITH CONTRAST TECHNIQUE: Multiplanar, multiecho pulse sequences of the brain and surrounding structures were obtained without and with intravenous contrast. CONTRAST:  MultiHance 15 mL. COMPARISON:  None. FINDINGS: No evidence for acute infarction, hemorrhage, mass lesion, hydrocephalus, or extra-axial fluid. Global atrophy. Mild subcortical and periventricular T2 and FLAIR hyperintensities, likely chronic microvascular ischemic change. Pituitary, pineal, and cerebellar tonsils unremarkable. No upper cervical lesions. Flow voids are maintained throughout the carotid, basilar, and vertebral arteries. There are no areas of chronic hemorrhage. No sinus or mastoid disease. Negative orbits. BILATERAL cataract extraction. Post infusion, no abnormal enhancement of the brain or meninges. IMPRESSION: Atrophy and small vessel disease.  No acute intracranial findings. No cause is identified for the reported symptoms. Electronically Signed   By: Staci Righter M.D.   On: 10/04/2015 10:46    Meds  The patient has a current medication list which includes the following prescription(s): aspirin ec, calcium citrate-vitamin d, cholecalciferol, clobetasol ointment, cyclobenzaprine, estradiol, fluticasone, hydrochlorothiazide, hydrocodone-acetaminophen, hydrocodone-acetaminophen, hydrocodone-acetaminophen, meloxicam, metoprolol succinate, multiple vitamins-minerals, pravastatin, and valsartan-hydrochlorothiazide.  Current Outpatient Prescriptions on File Prior to Visit  Medication Sig  . Cholecalciferol (VITAMIN D-1000 MAX ST) 1000 units tablet Take by mouth.  . clobetasol ointment (TEMOVATE) 0.05 % APPLY TWICE DAILY TO SORE AREAS IN MOUTH UNTIL HEALED. AVOID FACE AND SKIN FOLDS.  Marland Kitchen estradiol (ESTRACE) 1 MG tablet Take 1 mg by mouth daily.   . fluticasone (FLONASE) 50 MCG/ACT nasal spray USE 2 PUFFS EACH NOSTRIL AT BEDTIME as  needed  . metoprolol succinate (TOPROL-XL) 50 MG 24 hr tablet Take 50 mg by mouth daily.  . Multiple Vitamins-Minerals (OCUVITE EXTRA PO) Take 1 tablet by mouth daily.   . pravastatin (PRAVACHOL) 80 MG tablet Take 80 mg by mouth at bedtime.  . valsartan-hydrochlorothiazide (DIOVAN-HCT) 160-12.5 MG per tablet Take 1 tablet by mouth daily.   No current facility-administered medications on file prior to visit.     ROS  Constitutional: Denies any fever or chills Gastrointestinal: No reported hemesis, hematochezia, vomiting, or acute GI distress Musculoskeletal: Denies any acute onset joint swelling, redness, loss of ROM, or weakness Neurological: No reported episodes of acute onset apraxia, aphasia, dysarthria, agnosia, amnesia, paralysis, loss of coordination, or loss of consciousness  Allergies  Allison Mclean is allergic to atorvastatin and rosuvastatin.  [place]  Medical:  Allison Mclean  has a past medical history of Allergy; GERD (gastroesophageal reflux disease); Hyperlipidemia; Hypertension; and [place] esophagus. Family: family history includes Arthritis in her mother; Asthma in her father; Diabetes in her father and mother; Heart disease in her father and mother; Hypertension in her father and mother; Stroke in her father. Surgical:  has a past surgical history  that includes Cholecystectomy; Abdominal hysterectomy; Back surgery; Appendectomy; Tonsilectomy, adenoidectomy, bilateral myringotomy and tubes; and Cardiac catheterization (N/A, 02/07/2015). Tobacco:  reports that she has never smoked. She has never used smokeless tobacco. Alcohol:  reports that she does not drink alcohol. Drug:  reports that she does not use drugs.  Constitutional Exam  Vitals: Blood pressure (!) 162/77, pulse 66, temperature 97.7 F (36.5 C), temperature source Oral, resp. rate 16, height 5\' 3"  (1.6 m), weight 169 lb (76.7 kg), SpO2 100 %. General appearance: Well nourished, well developed, and well hydrated. In  no acute distress Calculated BMI/Body habitus: Body mass index is 29.94 kg/m. (25-29.9 kg/m2) Overweight - 20% higher incidence of chronic pain Psych/Mental status: Alert and oriented x 3 (person, place, & time) Eyes: PERLA Respiratory: No evidence of acute respiratory distress  Cervical Spine Exam  Inspection: No masses, redness, or swelling Alignment: Symmetrical ROM: Functional: ROM appears unrestricted Stability: No instability detected Muscle strength & Tone: Functionally intact Sensory: Unimpaired Palpation: Non-contributory  Upper Extremity (UE) Exam    Side: Right upper extremity  Side: Left upper extremity  Inspection: No masses, redness, swelling, or asymmetry  Inspection: No masses, redness, swelling, or asymmetry      ROM:  ROM:  Functional: ROM appears unrestricted  Functional: ROM appears unrestricted  Muscle strength & Tone: Functionally intact  Muscle strength & Tone: Functionally intact  Sensory: Unimpaired  Sensory: Unimpaired  Palpation: Non-contributory  Palpation: Non-contributory   Thoracic Spine Exam  Inspection: No masses, redness, or swelling Alignment: Symmetrical ROM: Functional: Limited ROM Stability: No instability detected Sensory: Movement-associated pain Muscle strength & Tone: Functionally intact Palpation: Complains of area being tender to palpation  Lumbar Spine Exam  Inspection: No masses, redness, or swelling Alignment: Symmetrical ROM: Functional: ROM appears unrestricted Stability: No instability detected Muscle strength & Tone: Functionally intact Sensory: Unimpaired Palpation: Non-contributory Provocative Tests: Lumbar Hyperextension and rotation test: evaluation deferred today       Patrick's Maneuver: evaluation deferred today              Gait & Posture Assessment  Ambulation: Patient ambulates using a cane Gait: Limited. Using assistive device to ambulate Posture: Thoracic kyphosis   Lower Extremity Exam    Side:  Right lower extremity  Side: Left lower extremity  Inspection: No masses, redness, swelling, or asymmetry ROM:  Inspection: No masses, redness, swelling, or asymmetry ROM:  Functional: ROM appears unrestricted  Functional: ROM appears unrestricted  Muscle strength & Tone: Functionally intact  Muscle strength & Tone: Functionally intact  Sensory: Unimpaired  Sensory: Unimpaired  Palpation: Non-contributory  Palpation: Non-contributory    Assessment & Plan  Primary Diagnosis & Pertinent Problem List: The primary encounter diagnosis was Chronic pain. Diagnoses of Encounter for therapeutic drug level monitoring, Long term current use of opiate analgesic, Opiate use, Spinal stenosis of thoracic region, Chronic thoracic radicular pain (T5/T6) (Left), Thoracic central spinal stenosis (T7-8, T8-9, T9-10, T10-11, T11-12, and T12-L1), and Thoracic foraminal stenosis (Moderate to severe at: Right T5; Bilateral T9; Left T11) were also pertinent to this visit.  Visit Diagnosis: 1. Chronic pain   2. Encounter for therapeutic drug level monitoring   3. Long term current use of opiate analgesic   4. Opiate use   5. Spinal stenosis of thoracic region   6. Chronic thoracic radicular pain (T5/T6) (Left)   7. Thoracic central spinal stenosis (T7-8, T8-9, T9-10, T10-11, T11-12, and T12-L1)   8. Thoracic foraminal stenosis (Moderate to severe at: Right T5; Bilateral T9;  Left T11)     Problems updated and reviewed during this visit: No problems updated.  Problem-specific Plan(s): No problem-specific Assessment & Plan notes found for this encounter.  No new Assessment & Plan notes have been filed under this hospital service since the last note was generated. Service: Pain Management   Plan of Care   Problem List Items Addressed This Visit      High   Chronic pain - Primary (Chronic)   Relevant Medications   cyclobenzaprine (FLEXERIL) 10 MG tablet   aspirin EC 81 MG tablet   meloxicam (MOBIC) 15  MG tablet   HYDROcodone-acetaminophen (NORCO/VICODIN) 5-325 MG tablet   HYDROcodone-acetaminophen (NORCO/VICODIN) 5-325 MG tablet   HYDROcodone-acetaminophen (NORCO/VICODIN) 5-325 MG tablet   Chronic thoracic radicular pain (T5/T6) (Left) (Chronic)   Relevant Orders   Thoracic Epidural Injection   Spinal stenosis of thoracic region   Thoracic central spinal stenosis (T7-8, T8-9, T9-10, T10-11, T11-12, and T12-L1) (Chronic)   Relevant Orders   Thoracic Epidural Injection   Thoracic foraminal stenosis (Moderate to severe at: Right T5; Bilateral T9; Left T11) (Chronic)   Relevant Orders   Thoracic Epidural Injection     Medium   Encounter for therapeutic drug level monitoring   Long term current use of opiate analgesic (Chronic)   Opiate use (Chronic)    Other Visit Diagnoses   None.      Pharmacotherapy (Medications Ordered): Meds ordered this encounter  Medications  . HYDROcodone-acetaminophen (NORCO/VICODIN) 5-325 MG tablet    Sig: Take 1 tablet by mouth 2 (two) times daily as needed for severe pain.    Dispense:  50 tablet    Refill:  0    Do not add this medication to the electronic "Automatic Refill" notification system. Patient may have prescription filled one day early if pharmacy is closed on scheduled refill date. Do not fill until: 11/06/15 To last until: 12/06/15  . HYDROcodone-acetaminophen (NORCO/VICODIN) 5-325 MG tablet    Sig: Take 1 tablet by mouth 2 (two) times daily as needed for severe pain.    Dispense:  50 tablet    Refill:  0    Do not add this medication to the electronic "Automatic Refill" notification system. Patient may have prescription filled one day early if pharmacy is closed on scheduled refill date. Do not fill until: 12/06/15 To last until: 01/05/16  . HYDROcodone-acetaminophen (NORCO/VICODIN) 5-325 MG tablet    Sig: Take 1 tablet by mouth 2 (two) times daily as needed for severe pain.    Dispense:  50 tablet    Refill:  0    Do not add this  medication to the electronic "Automatic Refill" notification system. Patient may have prescription filled one day early if pharmacy is closed on scheduled refill date. Do not fill until: 01/05/16 To last until: 02/04/16    [facility] & Procedure Ordered: Orders Placed This Encounter  Procedures  . Thoracic Epidural Injection    Imaging Ordered: None  Interventional Therapies: Scheduled:  Left T5-6 thoracic epidural steroid injection #3 under fluoroscopic guidance, no sedation.    Considering: Palliative procedures.   PRN Procedures:  Palliative Left T5-6 thoracic epidural steroid injection under fluoroscopic guidance, with or without sedation.    Referral(s) or Consult(s): None at this time.  New Prescriptions   No medications on file    Medications administered during this visit: Allison Mclean had no medications administered during this visit.  Requested PM Follow-up: Return in 2 months (on 01/20/2016) for (3-Mo) Med-Mgmt, In  addition, Schedule Procedure.  Future Appointments Date Time Provider [place]  11/21/2015 1:30 PM Milinda Pointer, MD [facility] None  01/20/2016 9:20 AM Milinda Pointer, MD [facility] None    Primary Care Physician: Idelle Crouch, MD Location: [facility] Note by: Kathlen Brunswick. Dossie Arbour, M.D, DABA, DABAPM, DABPM, DABIPP, FIPP  Pain Score Disclaimer: We use the NRS-11 scale. This is a self-reported, subjective measurement of pain severity with only modest accuracy. It is used primarily to identify changes within a particular patient. It must be understood that outpatient pain scales are significantly less accurate that those used for research, where they can be applied under ideal controlled circumstances with minimal exposure to variables. In reality, the score is likely to be a combination of pain intensity and pain affect, where pain affect describes the degree of emotional arousal or changes in action  readiness caused by the sensory experience of pain. Factors such as social and work situation, setting, emotional state, anxiety levels, expectation, and prior pain experience may influence pain perception and show large inter-individual differences that may also be affected by time variables.  Patient instructions provided during this appointment: There are no Patient Instructions on file for this visit.

## 2015-11-21 ENCOUNTER — Ambulatory Visit: Payer: Medicare Other | Attending: Pain Medicine | Admitting: Pain Medicine

## 2015-11-21 ENCOUNTER — Encounter: Payer: Self-pay | Admitting: Pain Medicine

## 2015-11-21 VITALS — BP 170/80 | HR 71 | Temp 98.1°F | Resp 16 | Ht 63.0 in | Wt 169.0 lb

## 2015-11-21 DIAGNOSIS — M4806 Spinal stenosis, lumbar region: Secondary | ICD-10-CM | POA: Insufficient documentation

## 2015-11-21 DIAGNOSIS — M4724 Other spondylosis with radiculopathy, thoracic region: Secondary | ICD-10-CM | POA: Diagnosis not present

## 2015-11-21 DIAGNOSIS — Z981 Arthrodesis status: Secondary | ICD-10-CM | POA: Insufficient documentation

## 2015-11-21 DIAGNOSIS — M4804 Spinal stenosis, thoracic region: Secondary | ICD-10-CM | POA: Diagnosis not present

## 2015-11-21 DIAGNOSIS — M5410 Radiculopathy, site unspecified: Secondary | ICD-10-CM

## 2015-11-21 DIAGNOSIS — Z9889 Other specified postprocedural states: Secondary | ICD-10-CM | POA: Diagnosis not present

## 2015-11-21 DIAGNOSIS — M8580 Other specified disorders of bone density and structure, unspecified site: Secondary | ICD-10-CM | POA: Insufficient documentation

## 2015-11-21 DIAGNOSIS — G8918 Other acute postprocedural pain: Secondary | ICD-10-CM | POA: Diagnosis not present

## 2015-11-21 DIAGNOSIS — M4316 Spondylolisthesis, lumbar region: Secondary | ICD-10-CM | POA: Insufficient documentation

## 2015-11-21 DIAGNOSIS — R05 Cough: Secondary | ICD-10-CM | POA: Insufficient documentation

## 2015-11-21 DIAGNOSIS — Z79891 Long term (current) use of opiate analgesic: Secondary | ICD-10-CM | POA: Diagnosis not present

## 2015-11-21 DIAGNOSIS — K589 Irritable bowel syndrome without diarrhea: Secondary | ICD-10-CM | POA: Diagnosis not present

## 2015-11-21 DIAGNOSIS — K219 Gastro-esophageal reflux disease without esophagitis: Secondary | ICD-10-CM | POA: Insufficient documentation

## 2015-11-21 DIAGNOSIS — R0789 Other chest pain: Secondary | ICD-10-CM | POA: Insufficient documentation

## 2015-11-21 DIAGNOSIS — E785 Hyperlipidemia, unspecified: Secondary | ICD-10-CM | POA: Insufficient documentation

## 2015-11-21 DIAGNOSIS — I10 Essential (primary) hypertension: Secondary | ICD-10-CM | POA: Insufficient documentation

## 2015-11-21 DIAGNOSIS — K529 Noninfective gastroenteritis and colitis, unspecified: Secondary | ICD-10-CM | POA: Insufficient documentation

## 2015-11-21 DIAGNOSIS — G8929 Other chronic pain: Secondary | ICD-10-CM | POA: Diagnosis not present

## 2015-11-21 DIAGNOSIS — M791 Myalgia: Secondary | ICD-10-CM | POA: Diagnosis not present

## 2015-11-21 DIAGNOSIS — M5414 Radiculopathy, thoracic region: Secondary | ICD-10-CM | POA: Diagnosis not present

## 2015-11-21 DIAGNOSIS — I471 Supraventricular tachycardia: Secondary | ICD-10-CM | POA: Insufficient documentation

## 2015-11-21 DIAGNOSIS — M546 Pain in thoracic spine: Secondary | ICD-10-CM | POA: Diagnosis present

## 2015-11-21 DIAGNOSIS — M549 Dorsalgia, unspecified: Secondary | ICD-10-CM | POA: Diagnosis not present

## 2015-11-21 MED ORDER — ROPIVACAINE HCL 2 MG/ML IJ SOLN
INTRAMUSCULAR | Status: AC
  Administered 2015-11-21: 15:00:00
  Filled 2015-11-21: qty 10

## 2015-11-21 MED ORDER — LIDOCAINE HCL (PF) 1 % IJ SOLN: 10.0000 mL | Freq: Once | INTRAMUSCULAR | Status: DC

## 2015-11-21 MED ORDER — SODIUM CHLORIDE 0.9% FLUSH: 2.0000 mL | Freq: Once | INTRAVENOUS | Status: DC

## 2015-11-21 MED ORDER — DEXAMETHASONE SODIUM PHOSPHATE 10 MG/ML IJ SOLN
10.0000 mg | Freq: Once | INTRAMUSCULAR | Status: DC
Start: 2015-11-21 — End: 2015-12-12

## 2015-11-21 MED ORDER — LIDOCAINE HCL (PF) 1 % IJ SOLN
INTRAMUSCULAR | Status: AC
Start: 2015-11-21 — End: 2015-11-21
  Administered 2015-11-21: 15:00:00
  Filled 2015-11-21: qty 5

## 2015-11-21 MED ORDER — DEXAMETHASONE SODIUM PHOSPHATE 10 MG/ML IJ SOLN
INTRAMUSCULAR | Status: AC
  Administered 2015-11-21: 15:00:00
  Filled 2015-11-21: qty 1

## 2015-11-21 MED ORDER — IOPAMIDOL (ISOVUE-M 200) INJECTION 41%
10.0000 mL | Freq: Once | INTRAMUSCULAR | Status: DC
  Filled 2015-11-21: qty 10

## 2015-11-21 MED ORDER — ROPIVACAINE HCL 2 MG/ML IJ SOLN: 2.0000 mL | Freq: Once | INTRAMUSCULAR | Status: DC

## 2015-11-21 MED ORDER — SODIUM CHLORIDE 0.9 % IJ SOLN
INTRAMUSCULAR | Status: AC
  Administered 2015-11-21: 15:00:00
  Filled 2015-11-21: qty 10

## 2015-11-21 NOTE — Patient Instructions (Signed)

## 2015-11-21 NOTE — Progress Notes (Signed)
Patient's Name: Allison Mclean  Patient type: Established  MRN: [id number]  Service setting: Ambulatory outpatient  DOB: Sep 01, 1935  Location: [facility]  DOS: 11/21/2015  Primary Care Physician: Idelle Crouch, MD  Note by: Kathlen Brunswick. Dossie Arbour, M.D, DABA, DABAPM, DABPM, DABIPP, [place]  Referring Physician: Idelle Crouch, MD  Specialty: Board-Certified Interventional Pain Management  Last Visit to Pain Management: 11/06/2015   Primary Reason(s) for Visit: Interventional Pain Management Treatment. CC: Back Pain (comes around into ribs on left side)  Radicular pain of thoracic region [M54.10]   Procedure:  Anesthesia, Analgesia, Anxiolysis:  Type: Therapeutic Inter-Laminar Thoracic Epidural Block Region: Posterior Thoracolumbar Level: T5-6 Laterality: Left-Sided Paraspinal  Indications: 1. Chronic thoracic radicular pain (T5/T6) (Left)   2. Spinal stenosis of thoracic region   3. Thoracic central spinal stenosis (T7-8, T8-9, T9-10, T10-11, T11-12, and T12-L1)   4. Thoracic foraminal stenosis (Moderate to severe at: Right T5; Bilateral T9; Left T11)   5. Thoracic spondylosis with radiculopathy (Left)     Pre-procedure Pain Score: 8/10 Reported level of pain is compatible with clinical observations Post-procedure Pain Score: 5   Type: Local Anesthesia Local Anesthetic: Lidocaine 1% Route: Infiltration ([place]/IM) IV Access: Declined Sedation: Declined  Indication(s): Analgesia        Pre-Procedure Assessment:  Allison Mclean is a 80 y.o. year old, female patient, seen today for interventional treatment. She has Acquired spondylolisthesis; Essential (primary) hypertension; Acid reflux; HLD (hyperlipidemia); Adaptive colitis; Supraventricular tachycardia ([place]); Cough; Atypical chest pain; Chronic pain; Long term current use of opiate analgesic; Long term prescription opiate use; Opiate use; Encounter for therapeutic drug level monitoring; Encounter for pain  management planning; Osteopenia, senile; Abnormal MRI, thoracic spine; Thoracic central spinal stenosis (T7-8, T8-9, T9-10, T10-11, T11-12, and T12-L1); Thoracic foraminal stenosis (Moderate to severe at: Right T5; Bilateral T9; Left T11); Chronic thoracic radicular pain (T5/T6) (Left); History of lumbar fusion (12/19/2013) (posterior lumbar fusion from L2-L5); Failed back surgical syndrome; Thoracic facet syndrome (Left); History of fusion of cervical spine; Thoracic spondylosis with radiculopathy (Left); Hypomagnesemia; BP (high blood pressure); Postoperative back pain; Nerve root pain; Spinal stenosis of thoracic region; Status post lumbar spine operation; Musculoskeletal pain; and Neuropathic pain on her problem list.. Her primarily concern today is the Back Pain (comes around into ribs on left side)   Pain Type: Chronic pain Pain Location: Back Pain Orientation: Left, Upper Pain Descriptors / Indicators: Throbbing, Radiating, Squeezing Pain Frequency: Constant  Date of Last Visit: 11/06/15 Service Provided on Last Visit: Med Refill  Coagulation Parameters Lab Results  Component Value Date   PLT 294 04/12/2015    Verification of the correct person, correct site (including marking of site), and correct procedure were performed and confirmed by the patient.  Consent: Secured. Under the influence of no sedatives a written informed consent was obtained, after having provided information on the risks and possible complications. To fulfill our ethical and legal obligations, as recommended by the American Medical Association's Code of Ethics, we have provided information to the patient about our clinical impression; the nature and purpose of the treatment or procedure; the risks, benefits, and possible complications of the intervention; alternatives; the risk(s) and benefit(s) of the alternative treatment(s) or procedure(s); and the risk(s) and benefit(s) of doing nothing. The patient was provided  information about the risks and possible complications associated with the procedure. These include, but are not limited to, failure to achieve desired goals, infection, bleeding, organ or nerve damage, allergic reactions, paralysis, and death.  In the case of spinal procedures these may include, but are not limited to, failure to achieve desired goals, infection, bleeding, organ or nerve damage, allergic reactions, paralysis, and death. In addition, the patient was informed that Medicine is not an exact science; therefore, there is also the possibility of unforeseen risks and possible complications that may result in a catastrophic outcome. The patient indicated having understood very clearly. We have given the patient no guarantees and we have made no promises. Enough time was given to the patient to ask questions, all of which were answered to the patient's satisfaction.  Consent Attestation: I, the ordering provider, attest that I have discussed with the patient the benefits, risks, side-effects, alternatives, likelihood of achieving goals, and potential problems during recovery for the procedure that I have provided informed consent.  Pre-Procedure Preparation: Safety Precautions: Allergies reviewed. Appropriate site, procedure, and patient were confirmed by following the Joint Commission's Universal Protocol (UP.01.01.01), in the form of a "Time Out". The patient was asked to confirm marked site and procedure, before commencing. The patient was asked about blood thinners, or active infections, both of which were denied. Patient was assessed for positional comfort and all pressure points were checked before starting procedure. Allergies: She is allergic to atorvastatin and rosuvastatin.. Infection Control Precautions: Sterile technique used. Standard Universal Precautions were taken as recommended by the Department of [facility] for Disease Control and Prevention (CDC). Standard pre-surgical  skin prep was conducted. Respiratory hygiene and cough etiquette was practiced. Hand hygiene observed. Safe injection practices and needle disposal techniques followed. SDV (single dose vial) medications used. Medications properly checked for expiration dates and contaminants. Personal protective equipment (PPE) used: Surgical mask. Sterile Radiation-resistant gloves. Monitoring:  As per clinic protocol. Vitals:   11/21/15 1416 11/21/15 1421 11/21/15 1426 11/21/15 1431  BP: (!) 168/106 (!) 171/70 (!) 165/85 (!) 170/80  Pulse: 68 69 79 71  Resp: '14 14 12 16  ' Temp:      TempSrc:      SpO2: 97% 96% 97% 97%  Weight:      Height:      Calculated BMI: Body mass index is 29.94 kg/m.  Description of Procedure Process:  Time-out: "Time-out" completed before starting procedure, as per protocol. Position: Prone Target Area: For Epidural Steroid injection(s), the target area is the  interlaminar space, initially targeting the lower border of the superior vertebral body lamina. Approach: Interlaminar approach. Area Prepped: Entire Posterior Thoracolumbar Region Prepping solution: Duraprep (Iodine Povacrylex [0.7% available Iodine] and Isopropyl Alcohol, 74% w/w) Safety Precautions: Aspiration looking for blood return was conducted prior to all injections. At no point did we inject any substances, as a needle was being advanced. No attempts were made at seeking any paresthesias. Safe injection practices and needle disposal techniques used. Medications properly checked for expiration dates. SDV (single dose vial) medications used.   Description of the Procedure: Protocol guidelines were followed. The patient was placed in position over the fluoroscopy table. The target area was identified and the area prepped in the usual manner. Skin & deeper tissues infiltrated with local anesthetic. Appropriate amount of time allowed to pass for local anesthetics to take effect. The procedure needles were then advanced to  the target area. The inferior aspect of the superior lamina was contacted and the needle walked caudad, until the lamina was cleared. The epidural space was identified using "loss-of-resistance technique" with 0.9% PF-NSS (2-71m), in a low friction 10cc LOR glass syringe. Proper needle placement was secured. Negative  aspiration confirmed. Solution injected in intermittent fashion, asking for systemic symptoms every 0.5 cc of injectate. The needles were then removed and the area cleansed, making sure to leave some of the prepping solution behind to take advantage of its long term bactericidal properties. EBL: None Materials & Medications Used:  Needle(s) Used: 20g - 10cm, Tuohy-style epidural needle Medication(s): Please see chart orders for medication and dosing details.  Imaging Guidance:   Type of Imaging Technique: Fluoroscopy Guidance (Spinal) Indication(s): Assistance in needle guidance and placement for procedures requiring needle placement in or near specific anatomical locations not easily accessible without such assistance. Exposure Time: Please see nurses notes. Contrast: Before injecting any contrast, we confirmed that the patient did not have an allergy to iodine, shellfish, or radiological contrast. Once satisfactory needle placement was completed at the desired level, radiological contrast was injected. Injection was conducted under continuous fluoroscopic guidance. Injection of contrast accomplished without complications. See chart for type and volume of contrast used. Fluoroscopic Guidance: I was personally present in the fluoroscopy suite, where the patient was placed in position for the procedure, over the fluoroscopy-compatible table. Fluoroscopy was manipulated, using "Tunnel Vision Technique", to obtain the best possible view of the target area, on the affected side. Parallax error was corrected before commencing the procedure. A "direction-depth-direction" technique was used to  introduce the needle under continuous pulsed fluoroscopic guidance. Once the target was reached, antero-posterior, oblique, and lateral fluoroscopic projection views were taken to confirm needle placement in all planes. Permanently recorded images stored by scanning into EMR. Interpretation: Intraoperative imaging interpretation by performing Physician. Adequate needle placement confirmed in AP & Oblique Views. Appropriate spread of contrast to desired area. No evidence of afferent or efferent intravascular uptake. No intrathecal or subarachnoid spread observed. Permanent images scanned into the patient's record.  Antibiotic Prophylaxis:  Indication(s): No indications identified. Type:  Antibiotics Given (last 72 hours)    None       Post-operative Assessment:   Complications: No immediate post-treatment complications were observed. Disposition: Return to clinic for follow-up evaluation. The patient tolerated the entire procedure well. A repeat set of vitals were taken after the procedure and the patient was kept under observation following institutional policy, for this type of procedure. Post-procedural neurological assessment was performed, showing return to baseline, prior to discharge. The patient was discharged home, once institutional criteria were met. The patient was provided with post-procedure discharge instructions, including a section on how to identify potential problems. Should any problems arise concerning this procedure, the patient was given instructions to immediately contact us, at any time, without hesitation. In any case, we plan to contact the patient by telephone for a follow-up status report regarding this interventional procedure. Comments:  No additional relevant information.  Plan of Care   Problem List Items Addressed This Visit      High   Chronic thoracic radicular pain (T5/T6) (Left) - Primary (Chronic)   Relevant Medications   dexamethasone (DECADRON) injection  10 mg   iopamidol (ISOVUE-M) 41 % intrathecal injection 10 mL   lidocaine (PF) (XYLOCAINE) 1 % injection 10 mL   sodium chloride flush (NS) 0.9 % injection 2 mL   ropivacaine (PF) 2 mg/ml (0.2%) (NAROPIN) epidural 2 mL   Other Relevant Orders   Thoracic Epidural Injection   Spinal stenosis of thoracic region   Thoracic central spinal stenosis (T7-8, T8-9, T9-10, T10-11, T11-12, and T12-L1) (Chronic)   Relevant Orders   Thoracic Epidural Injection   Thoracic foraminal stenosis (Moderate to  severe at: Right T5; Bilateral T9; Left T11) (Chronic)   Thoracic spondylosis with radiculopathy (Left) (Chronic)   Relevant Medications   dexamethasone (DECADRON) injection 10 mg   HYDROcodone-acetaminophen (NORCO/VICODIN) 5-325 MG tablet   HYDROcodone-acetaminophen (NORCO/VICODIN) 5-325 MG tablet   dexamethasone (DECADRON) 10 MG/ML injection (Completed)    Other Visit Diagnoses   None.     Requested PM Follow-up: Return in about 2 weeks (around 12/05/2015) for Post-Procedure evaluation.  Future Appointments Date Time Provider Cooper  12/12/2015 9:40 AM Milinda Pointer, MD [facility] None  01/20/2016 9:20 AM Milinda Pointer, MD [facility] None    Primary Care Physician: Idelle Crouch, MD Location: [facility] Note by: Kathlen Brunswick. Dossie Arbour, M.D, DABA, DABAPM, DABPM, DABIPP, FIPP  Disclaimer:  Medicine is not an exact science. The only guarantee in medicine is that nothing is guaranteed. It is important to note that the decision to proceed with this intervention was based on the information collected from the patient. The Data and conclusions were drawn from the patient's questionnaire, the interview, and the physical examination. Because the information was provided in large part by the patient, it cannot be guaranteed that it has not been purposely or unconsciously manipulated. Every effort has been made to obtain as much relevant data as possible for  this evaluation. It is important to note that the conclusions that lead to this procedure are derived in large part from the available data. Always take into account that the treatment will also be dependent on availability of resources and existing treatment guidelines, considered by other Pain Management Practitioners as being common knowledge and practice, at the time of the intervention. For Medico-Legal purposes, it is also important to point out that variation in procedural techniques and pharmacological choices are the acceptable norm. The indications, contraindications, technique, and results of the above procedure should only be interpreted and judged by a Board-Certified Interventional Pain Specialist with extensive familiarity and expertise in the same exact procedure and technique. Attempts at providing opinions without similar or greater experience and expertise than that of the treating physician will be considered as inappropriate and unethical, and shall result in a formal complaint to the state medical board and applicable specialty societies.

## 2015-11-22 ENCOUNTER — Telehealth: Payer: Self-pay | Admitting: *Deleted

## 2015-11-22 NOTE — Telephone Encounter (Signed)
No problems post procedure. 

## 2015-12-12 ENCOUNTER — Encounter: Payer: Self-pay | Admitting: Pain Medicine

## 2015-12-12 ENCOUNTER — Ambulatory Visit: Payer: Medicare Other | Attending: Pain Medicine | Admitting: Pain Medicine

## 2015-12-12 VITALS — BP 154/51 | HR 56 | Temp 98.2°F | Resp 18 | Ht 63.0 in | Wt 167.0 lb

## 2015-12-12 DIAGNOSIS — G8929 Other chronic pain: Secondary | ICD-10-CM | POA: Diagnosis not present

## 2015-12-12 DIAGNOSIS — I471 Supraventricular tachycardia: Secondary | ICD-10-CM | POA: Diagnosis not present

## 2015-12-12 DIAGNOSIS — M791 Myalgia: Secondary | ICD-10-CM | POA: Insufficient documentation

## 2015-12-12 DIAGNOSIS — R51 Headache: Secondary | ICD-10-CM | POA: Diagnosis not present

## 2015-12-12 DIAGNOSIS — M4724 Other spondylosis with radiculopathy, thoracic region: Secondary | ICD-10-CM | POA: Insufficient documentation

## 2015-12-12 DIAGNOSIS — I10 Essential (primary) hypertension: Secondary | ICD-10-CM | POA: Insufficient documentation

## 2015-12-12 DIAGNOSIS — K589 Irritable bowel syndrome without diarrhea: Secondary | ICD-10-CM | POA: Insufficient documentation

## 2015-12-12 DIAGNOSIS — F1190 Opioid use, unspecified, uncomplicated: Secondary | ICD-10-CM

## 2015-12-12 DIAGNOSIS — R0789 Other chest pain: Secondary | ICD-10-CM | POA: Insufficient documentation

## 2015-12-12 DIAGNOSIS — M8580 Other specified disorders of bone density and structure, unspecified site: Secondary | ICD-10-CM | POA: Diagnosis not present

## 2015-12-12 DIAGNOSIS — Z7982 Long term (current) use of aspirin: Secondary | ICD-10-CM | POA: Insufficient documentation

## 2015-12-12 DIAGNOSIS — Z9889 Other specified postprocedural states: Secondary | ICD-10-CM | POA: Insufficient documentation

## 2015-12-12 DIAGNOSIS — K5289 Other specified noninfective gastroenteritis and colitis: Secondary | ICD-10-CM | POA: Insufficient documentation

## 2015-12-12 DIAGNOSIS — M4316 Spondylolisthesis, lumbar region: Secondary | ICD-10-CM | POA: Insufficient documentation

## 2015-12-12 DIAGNOSIS — M5414 Radiculopathy, thoracic region: Secondary | ICD-10-CM | POA: Diagnosis not present

## 2015-12-12 DIAGNOSIS — M4804 Spinal stenosis, thoracic region: Secondary | ICD-10-CM

## 2015-12-12 DIAGNOSIS — K219 Gastro-esophageal reflux disease without esophagitis: Secondary | ICD-10-CM | POA: Insufficient documentation

## 2015-12-12 DIAGNOSIS — Z79891 Long term (current) use of opiate analgesic: Secondary | ICD-10-CM | POA: Diagnosis not present

## 2015-12-12 DIAGNOSIS — M546 Pain in thoracic spine: Secondary | ICD-10-CM | POA: Diagnosis present

## 2015-12-12 DIAGNOSIS — E785 Hyperlipidemia, unspecified: Secondary | ICD-10-CM | POA: Insufficient documentation

## 2015-12-12 DIAGNOSIS — M5410 Radiculopathy, site unspecified: Secondary | ICD-10-CM | POA: Diagnosis not present

## 2015-12-12 DIAGNOSIS — Z90710 Acquired absence of both cervix and uterus: Secondary | ICD-10-CM | POA: Diagnosis not present

## 2015-12-12 NOTE — Progress Notes (Signed)
Patient here for procedure f/up d/t upper mid back pain that radiates around to ribs.  States she did get any relief from procedure long term.   Safety precautions to be maintained throughout the outpatient stay will include: orient to surroundings, keep bed in low position, maintain call bell within reach at all times, provide assistance with transfer out of bed and ambulation.

## 2015-12-12 NOTE — Progress Notes (Signed)
Patient's Name: Allison Mclean  MRN: [id number]  Referring Provider: Idelle Crouch, MD  DOB: January 20, 1936  PCP: Idelle Crouch, MD  DOS: 12/12/2015  Note by: Kathlen Brunswick. Dossie Arbour, MD  Service setting: Ambulatory outpatient  Specialty: Interventional Pain Management  Location: [facility] (AMB) Pain Management Facility    Patient type: Established   Primary Reason(s) for Visit: Encounter for post-procedure evaluation of chronic illness with mild to moderate exacerbation CC: Back Pain (upper)   HPI  Allison Mclean is a 80 y.o. year old, female patient, who returns today as an established patient. She has Acquired spondylolisthesis; Essential (primary) hypertension; Acid reflux; HLD (hyperlipidemia); Adaptive colitis; Supraventricular tachycardia ([place]); Cough; Atypical chest pain; Chronic pain; Long term current use of opiate analgesic; Long term prescription opiate use; Opiate use (10 MME/day); Encounter for therapeutic drug level monitoring; Encounter for pain management planning; Osteopenia, senile; Abnormal MRI, thoracic spine; Thoracic central spinal stenosis (T7-8, T8-9, T9-10, T10-11, T11-12, and T12-L1); Thoracic foraminal stenosis (Moderate to severe at: Right T5; Bilateral T9; Left T11); Chronic thoracic radicular pain (T5/T6) (Left); History of lumbar fusion (12/19/2013) (posterior lumbar fusion from L2-L5); Failed back surgical syndrome; Thoracic facet syndrome (Left); History of fusion of cervical spine; Thoracic spondylosis with radiculopathy (Left); Hypomagnesemia; BP (high blood pressure); Postoperative back pain; Nerve root pain; Spinal stenosis of thoracic region; Status post lumbar spine operation; Musculoskeletal pain; and Neuropathic pain on her problem list.. Her primarily concern today is the Back Pain (upper)   Pain Assessment: Self-Reported Pain Score: 3              Reported level is compatible with observation       Pain Type: Chronic pain Pain Location: Back Pain Orientation: Upper,  Mid Pain Descriptors / Indicators: Throbbing, Radiating, Squeezing Pain Frequency: Constant  The patient comes into the clinics today for post-procedure evaluation on the interventional treatment done on 11/21/2015. She saw Dr. Vertell Limber yesterday. She has a return appointment with him on 12/30/15. She is probably going to have some surgery. We have provided her with the post-op pain management handout for the surgeon. We will continue to manage her chronic pain after her surgery.   Date of Last Visit: 11/21/15 Service Provided on Last Visit: Procedure  Post-Procedure Assessment  Procedure done on last visit: Left T5-6 TESI under fluoroscopy, no sedation. Side-effects or Adverse reactions: None reported Sedation: No sedation used  Results: Ultra-Short Term Relief (First 1 hour after procedure): 80 %  No IV Analgesics or Anxiolytics given, therefore the benefit is completely due to Local Anesthetics Short Term Relief (Initial 4-6 hrs after procedure): 80 % Complete relief confirms area to be the source of pain Long Term Relief : 0 % (patient states that she feels she has no pain relief at this time and is having to use her pain medication more than she would like to but as prescribed.   patient also states that her pain increased during the progression of the day. ) No benefit could suggest etiology to be non-inflammatory, possibly compressive         Current Relief (Now): 0%  No persistent benefit would suggest no long-term anti-inflammatory benefit from intervention and/or persistent or recurrent aggravating factors. Pain is more mechanical due to stenosis. Interpretation of Results: Not responding to conservative therapy or more aggressive interventional techniques.   Laboratory Chemistry  Inflammation Markers Lab Results  Component Value Date   ESRSEDRATE 39 (H) 07/03/2015   CRP 1.9 (H) 07/03/2015    Renal Function  Lab Results  Component Value Date   BUN 13 04/12/2015   CREATININE  0.79 04/12/2015   GFRAA >60 04/12/2015   GFRNONAA >60 04/12/2015    Hepatic Function Lab Results  Component Value Date   AST 17 04/12/2015   ALT 12 (L) 04/12/2015   ALBUMIN 3.8 04/12/2015    Electrolytes Lab Results  Component Value Date   NA 135 04/12/2015   K 3.5 04/12/2015   CL 94 (L) 04/12/2015   CALCIUM 10.1 04/12/2015   MG 1.5 (L) 07/03/2015    Pain Modulating Vitamins Lab Results  Component Value Date   VD25OH 43.1 07/03/2015    Coagulation Parameters Lab Results  Component Value Date   PLT 294 04/12/2015    Cardiovascular Lab Results  Component Value Date   HGB 13.3 04/12/2015   HCT 39.4 04/12/2015    Note: Lab results reviewed.  Recent Diagnostic Imaging  Mr Jeri Cos Wo Contrast  Result Date: 10/04/2015 CLINICAL DATA:  Recurrent headache and near syncopal episodes. EXAM: MRI HEAD WITHOUT AND WITH CONTRAST TECHNIQUE: Multiplanar, multiecho pulse sequences of the brain and surrounding structures were obtained without and with intravenous contrast. CONTRAST:  MultiHance 15 mL. COMPARISON:  None. FINDINGS: No evidence for acute infarction, hemorrhage, mass lesion, hydrocephalus, or extra-axial fluid. Global atrophy. Mild subcortical and periventricular T2 and FLAIR hyperintensities, likely chronic microvascular ischemic change. Pituitary, pineal, and cerebellar tonsils unremarkable. No upper cervical lesions. Flow voids are maintained throughout the carotid, basilar, and vertebral arteries. There are no areas of chronic hemorrhage. No sinus or mastoid disease. Negative orbits. BILATERAL cataract extraction. Post infusion, no abnormal enhancement of the brain or meninges. IMPRESSION: Atrophy and small vessel disease.  No acute intracranial findings. No cause is identified for the reported symptoms. Electronically Signed   By: Staci Righter M.D.   On: 10/04/2015 10:46    Meds  The patient has a current medication list which includes the following prescription(s):  aspirin ec, calcium citrate-vitamin d, cholecalciferol, clobetasol ointment, clobetasol ointment, estradiol, fluticasone, hydrochlorothiazide, hydrocodone-acetaminophen, hydrocodone-acetaminophen, hydrocodone-acetaminophen, meloxicam, metoprolol succinate, multiple vitamins-minerals, omeprazole, pravastatin, and valsartan-hydrochlorothiazide.  Current Outpatient Prescriptions on File Prior to Visit  Medication Sig  . aspirin EC 81 MG tablet Take 81 mg by mouth daily.   . calcium citrate-vitamin D (CITRACAL+D) 315-200 MG-UNIT tablet Take by mouth daily.   . Cholecalciferol (VITAMIN D-1000 MAX ST) 1000 units tablet Take by mouth daily.   . clobetasol ointment (TEMOVATE) 0.05 % APPLY TWICE DAILY TO SORE AREAS IN MOUTH UNTIL HEALED. AVOID FACE AND SKIN FOLDS.  . clobetasol ointment (TEMOVATE) 0.05 % Apply twice daily to sore areas in mouth until healed. Avoid face and skin folds.  Marland Kitchen estradiol (ESTRACE) 1 MG tablet Take 1 mg by mouth daily.   . fluticasone (FLONASE) 50 MCG/ACT nasal spray USE 2 PUFFS EACH NOSTRIL AT BEDTIME as needed  . HYDROcodone-acetaminophen (NORCO/VICODIN) 5-325 MG tablet Take 1 tablet by mouth 2 (two) times daily as needed for severe pain.  Marland Kitchen HYDROcodone-acetaminophen (NORCO/VICODIN) 5-325 MG tablet Take 1 tablet by mouth 2 (two) times daily as needed for severe pain.  Marland Kitchen HYDROcodone-acetaminophen (NORCO/VICODIN) 5-325 MG tablet Take 1 tablet by mouth 2 (two) times daily as needed for severe pain.  . meloxicam (MOBIC) 15 MG tablet TAKE 1 TABLET (15 MG TOTAL) BY MOUTH ONCE DAILY.  . metoprolol succinate (TOPROL-XL) 50 MG 24 hr tablet Take 50 mg by mouth daily.  . Multiple Vitamins-Minerals (OCUVITE EXTRA PO) Take 1 tablet by mouth daily.   Marland Kitchen  omeprazole (PRILOSEC) 40 MG capsule Take 40 mg by mouth daily.   . pravastatin (PRAVACHOL) 80 MG tablet Take 80 mg by mouth at bedtime.  . valsartan-hydrochlorothiazide (DIOVAN-HCT) 160-12.5 MG per tablet Take 1 tablet by mouth daily.   No  current facility-administered medications on file prior to visit.     ROS  Constitutional: Denies any fever or chills Gastrointestinal: No reported hemesis, hematochezia, vomiting, or acute GI distress Musculoskeletal: Denies any acute onset joint swelling, redness, loss of ROM, or weakness Neurological: No reported episodes of acute onset apraxia, aphasia, dysarthria, agnosia, amnesia, paralysis, loss of coordination, or loss of consciousness  Allergies  Allison Mclean is allergic to atorvastatin and rosuvastatin.  [place]  Medical:  Allison Mclean  has a past medical history of Allergy; GERD (gastroesophageal reflux disease); Hyperlipidemia; Hypertension; and [place] esophagus. Family: family history includes Arthritis in her mother; Asthma in her father; Diabetes in her father and mother; Heart disease in her father and mother; Hypertension in her father and mother; Stroke in her father. Surgical:  has a past surgical history that includes Cholecystectomy; Abdominal hysterectomy; Back surgery; Appendectomy; Tonsilectomy, adenoidectomy, bilateral myringotomy and tubes; and Cardiac catheterization (N/A, 02/07/2015). Tobacco:  reports that she has never smoked. She has never used smokeless tobacco. Alcohol:  reports that she does not drink alcohol. Drug:  reports that she does not use drugs.  Constitutional Exam  Vitals: Blood pressure (!) 154/51, pulse (!) 56, temperature 98.2 F (36.8 C), resp. rate 18, height 5\' 3"  (1.6 m), weight 167 lb (75.8 kg), SpO2 100 %. General appearance: Well nourished, well developed, and well hydrated. In no acute distress Calculated BMI/Body habitus: Body mass index is 29.58 kg/m.       Psych/Mental status: Alert and oriented x 3 (person, place, & time) Eyes: PERLA Respiratory: No evidence of acute respiratory distress  Cervical Spine Exam  Inspection: No masses, redness, or swelling Alignment: Symmetrical Functional ROM: ROM appears unrestricted Stability:  No instability detected Muscle strength & Tone: Functionally intact Sensory: Unimpaired Palpation: Non-contributory  Upper Extremity (UE) Exam    Side: Right upper extremity  Side: Left upper extremity  Inspection: No masses, redness, swelling, or asymmetry  Inspection: No masses, redness, swelling, or asymmetry  Functional ROM: ROM appears unrestricted          Functional ROM: ROM appears unrestricted          Muscle strength & Tone: Functionally intact  Muscle strength & Tone: Functionally intact  Sensory: Unimpaired  Sensory: Unimpaired  Palpation: Non-contributory  Palpation: Non-contributory   Thoracic Spine Exam  Inspection: No masses, redness, or swelling Alignment: Symmetrical Functional ROM: ROM appears unrestricted Stability: No instability detected Sensory: Unimpaired Muscle strength & Tone: Functionally intact Palpation: Non-contributory  Lumbar Spine Exam  Inspection: No masses, redness, or swelling Alignment: Symmetrical Functional ROM: ROM appears unrestricted Stability: No instability detected Muscle strength & Tone: Functionally intact Sensory: Unimpaired Palpation: Non-contributory Provocative Tests: Lumbar Hyperextension and rotation test: evaluation deferred today       Patrick's Maneuver: evaluation deferred today              Gait & Posture Assessment  Ambulation: Unassisted Gait: Relatively normal for age and body habitus Posture: WNL   Lower Extremity Exam    Side: Right lower extremity  Side: Left lower extremity  Inspection: No masses, redness, swelling, or asymmetry  Inspection: No masses, redness, swelling, or asymmetry  Functional ROM: ROM appears unrestricted  Functional ROM: ROM appears unrestricted          Muscle strength & Tone: Functionally intact  Muscle strength & Tone: Functionally intact  Sensory: Unimpaired  Sensory: Unimpaired  Palpation: Non-contributory  Palpation: Non-contributory    Assessment & Plan  Primary  Diagnosis & Pertinent Problem List: The primary encounter diagnosis was Chronic thoracic radicular pain (T5/T6) (Left). Diagnoses of Spinal stenosis of thoracic region, Thoracic central spinal stenosis (T7-8, T8-9, T9-10, T10-11, T11-12, and T12-L1), Thoracic foraminal stenosis (Moderate to severe at: Right T5; Bilateral T9; Left T11), Long term current use of opiate analgesic, and Opiate use (10 MME/day) were also pertinent to this visit.  Visit Diagnosis: 1. Chronic thoracic radicular pain (T5/T6) (Left)   2. Spinal stenosis of thoracic region   3. Thoracic central spinal stenosis (T7-8, T8-9, T9-10, T10-11, T11-12, and T12-L1)   4. Thoracic foraminal stenosis (Moderate to severe at: Right T5; Bilateral T9; Left T11)   5. Long term current use of opiate analgesic   6. Opiate use (10 MME/day)     Problem-specific Plan(s): No problem-specific Assessment & Plan notes found for this encounter.   Plan of Care   Problem List Items Addressed This Visit      High   Chronic thoracic radicular pain (T5/T6) (Left) - Primary (Chronic)   Spinal stenosis of thoracic region   Thoracic central spinal stenosis (T7-8, T8-9, T9-10, T10-11, T11-12, and T12-L1) (Chronic)   Thoracic foraminal stenosis (Moderate to severe at: Right T5; Bilateral T9; Left T11) (Chronic)     Medium   Long term current use of opiate analgesic (Chronic)   Opiate use (10 MME/day) (Chronic)    Other Visit Diagnoses   None.      Pharmacotherapy (Medications Ordered): No orders of the defined types were placed in this encounter.   Lab-work & Procedure Ordered: No orders of the defined types were placed in this encounter.   Imaging Ordered: None  Interventional Therapies: Scheduled:  None at this time.   Considering:  None at this time.   PRN Procedures:  None at this time.   Referral(s) or Consult(s): None at this time.  Medications administered during this visit: Allison Mclean had no medications administered  during this visit.  Requested PM Follow-up: No Follow-up on file.  Future Appointments Date Time Provider [place]  01/20/2016 9:20 AM Milinda Pointer, MD [facility] None    Primary Care Physician: Idelle Crouch, MD Location: [facility] Note by: Kathlen Brunswick. Dossie Arbour, M.D, DABA, DABAPM, DABPM, DABIPP, FIPP  Pain Score Disclaimer: We use the NRS-11 scale. This is a self-reported, subjective measurement of pain severity with only modest accuracy. It is used primarily to identify changes within a particular patient. It must be understood that outpatient pain scales are significantly less accurate that those used for research, where they can be applied under ideal controlled circumstances with minimal exposure to variables. In reality, the score is likely to be a combination of pain intensity and pain affect, where pain affect describes the degree of emotional arousal or changes in action readiness caused by the sensory experience of pain. Factors such as social and work situation, setting, emotional state, anxiety levels, expectation, and prior pain experience may influence pain perception and show large inter-individual differences that may also be affected by time variables.  Patient instructions provided during this appointment: There are no Patient Instructions on file for this visit.

## 2015-12-31 ENCOUNTER — Other Ambulatory Visit: Payer: Self-pay | Admitting: Neurological Surgery

## 2015-12-31 ENCOUNTER — Telehealth: Payer: Self-pay | Admitting: Vascular Surgery

## 2015-12-31 ENCOUNTER — Other Ambulatory Visit: Payer: Self-pay | Admitting: Neurosurgery

## 2015-12-31 NOTE — Telephone Encounter (Signed)
Spoke to pt about consult for ALIF with surgery on 11/10, appt with Dr Early for 10/31

## 2015-12-31 NOTE — Telephone Encounter (Signed)
-----   Message from Gregery Na, RN sent at 12/30/2015  4:56 PM EDT ----- Regarding: OV with TFE Ms. Kuzel is scheduled for an ALIF L5-S1 with Drs. Early Vertell Limber on 02/14/16. Please schedule patient an OV with Dr. Donnetta Hutching at least two weeks prior to surgery. Also, please remind patient to bring LS spine films to appt.  Thanks, Colletta [place]

## 2016-01-02 ENCOUNTER — Telehealth: Payer: Self-pay | Admitting: Pain Medicine

## 2016-01-02 NOTE — Telephone Encounter (Signed)
In a lot of pain, meds not helping, can she get something stronger, no appts available to bring patient in early, please call patient

## 2016-01-03 ENCOUNTER — Telehealth: Payer: Self-pay | Admitting: *Deleted

## 2016-01-03 NOTE — Telephone Encounter (Signed)
Spoke with patient.  She states that she is having pain in her upper back and around to her rib cage.  The pain has gotten worse since last week.  Does not recall doing anything to make the pain worse.  Has always taken Hydrocodone for the pain bid and this has always helped in the past, but now is only working for an hour after she takes it.  She is also taking Mobic and flexeril.  Patient is scheduled to have back surgery on November 10th.  Would like to see Dr Dossie Arbour to see if he can adjust her medications as soon as possible. It is documented that there are no appointments available.  Routed to unit coordinator per Dr Dossie Arbour for scheduling issues.

## 2016-01-03 NOTE — Telephone Encounter (Signed)
Juliann Pulse instructed to schedule patient Monday October 2 at 10:20. Patient aware.

## 2016-01-06 ENCOUNTER — Encounter: Payer: Self-pay | Admitting: Pain Medicine

## 2016-01-06 ENCOUNTER — Ambulatory Visit: Payer: Medicare Other | Attending: Pain Medicine | Admitting: Pain Medicine

## 2016-01-06 VITALS — BP 103/50 | HR 62 | Temp 97.6°F | Resp 18 | Ht 61.0 in | Wt 166.0 lb

## 2016-01-06 DIAGNOSIS — I10 Essential (primary) hypertension: Secondary | ICD-10-CM | POA: Diagnosis not present

## 2016-01-06 DIAGNOSIS — M8580 Other specified disorders of bone density and structure, unspecified site: Secondary | ICD-10-CM | POA: Diagnosis not present

## 2016-01-06 DIAGNOSIS — Z7982 Long term (current) use of aspirin: Secondary | ICD-10-CM | POA: Insufficient documentation

## 2016-01-06 DIAGNOSIS — Z9049 Acquired absence of other specified parts of digestive tract: Secondary | ICD-10-CM | POA: Diagnosis not present

## 2016-01-06 DIAGNOSIS — Z981 Arthrodesis status: Secondary | ICD-10-CM | POA: Diagnosis not present

## 2016-01-06 DIAGNOSIS — M4804 Spinal stenosis, thoracic region: Secondary | ICD-10-CM | POA: Diagnosis not present

## 2016-01-06 DIAGNOSIS — M47814 Spondylosis without myelopathy or radiculopathy, thoracic region: Secondary | ICD-10-CM

## 2016-01-06 DIAGNOSIS — K219 Gastro-esophageal reflux disease without esophagitis: Secondary | ICD-10-CM | POA: Diagnosis not present

## 2016-01-06 DIAGNOSIS — M961 Postlaminectomy syndrome, not elsewhere classified: Secondary | ICD-10-CM

## 2016-01-06 DIAGNOSIS — E785 Hyperlipidemia, unspecified: Secondary | ICD-10-CM | POA: Diagnosis not present

## 2016-01-06 DIAGNOSIS — G8929 Other chronic pain: Secondary | ICD-10-CM | POA: Insufficient documentation

## 2016-01-06 DIAGNOSIS — Z8249 Family history of ischemic heart disease and other diseases of the circulatory system: Secondary | ICD-10-CM | POA: Diagnosis not present

## 2016-01-06 DIAGNOSIS — M47894 Other spondylosis, thoracic region: Secondary | ICD-10-CM

## 2016-01-06 DIAGNOSIS — Z823 Family history of stroke: Secondary | ICD-10-CM | POA: Diagnosis not present

## 2016-01-06 DIAGNOSIS — Z90710 Acquired absence of both cervix and uterus: Secondary | ICD-10-CM | POA: Diagnosis not present

## 2016-01-06 DIAGNOSIS — M5414 Radiculopathy, thoracic region: Secondary | ICD-10-CM | POA: Diagnosis not present

## 2016-01-06 DIAGNOSIS — F1190 Opioid use, unspecified, uncomplicated: Secondary | ICD-10-CM

## 2016-01-06 DIAGNOSIS — Z8261 Family history of arthritis: Secondary | ICD-10-CM | POA: Diagnosis not present

## 2016-01-06 DIAGNOSIS — M1288 Other specific arthropathies, not elsewhere classified, other specified site: Secondary | ICD-10-CM

## 2016-01-06 DIAGNOSIS — K589 Irritable bowel syndrome without diarrhea: Secondary | ICD-10-CM | POA: Insufficient documentation

## 2016-01-06 DIAGNOSIS — Z833 Family history of diabetes mellitus: Secondary | ICD-10-CM | POA: Insufficient documentation

## 2016-01-06 DIAGNOSIS — Z79891 Long term (current) use of opiate analgesic: Secondary | ICD-10-CM | POA: Insufficient documentation

## 2016-01-06 DIAGNOSIS — Z888 Allergy status to other drugs, medicaments and biological substances status: Secondary | ICD-10-CM | POA: Diagnosis not present

## 2016-01-06 DIAGNOSIS — M48061 Spinal stenosis, lumbar region without neurogenic claudication: Secondary | ICD-10-CM | POA: Diagnosis not present

## 2016-01-06 MED ORDER — HYDROCODONE-ACETAMINOPHEN 5-325 MG PO TABS: 1.0000 | ORAL_TABLET | Freq: Two times a day (BID) | ORAL | 0 refills | Status: DC | PRN

## 2016-01-06 MED ORDER — OXYCODONE HCL 5 MG PO TABS: 5.0000 mg | ORAL_TABLET | Freq: Four times a day (QID) | ORAL | 0 refills | Status: DC | PRN

## 2016-01-06 NOTE — Progress Notes (Signed)
Safety precautions to be maintained throughout the outpatient stay will include: orient to surroundings, keep bed in low position, maintain call bell within reach at all times, provide assistance with transfer out of bed and ambulation.  

## 2016-01-06 NOTE — Progress Notes (Signed)
Abcess in left upper tooth taking amoxicillin.  Back surgery scheduled for Nov. 10th.

## 2016-01-06 NOTE — Progress Notes (Signed)
One hydrocodone 5/325 mg tablet was wasted down the toilet with witness of patient and Deno Lunger, RN and myself as instructed by Dr. Dossie Arbour.

## 2016-01-06 NOTE — Progress Notes (Signed)
Patient's Name: Allison Mclean  MRN: [id number]  Referring Provider: Idelle Crouch, MD  DOB: [birth year]  PCP: Idelle Crouch, MD  DOS: 01/06/2016  Note by: Kathlen Brunswick. Dossie Arbour, MD  Service setting: Ambulatory outpatient  Specialty: Interventional Pain Management  Location: [facility] (AMB) Pain Management Facility    Patient type: Established   Primary Reason(s) for Visit: Encounter for prescription drug management (Level of risk: moderate) CC: Back Pain (upper back)  HPI  Allison Mclean is a 80 y.o. year old, female patient, who comes today for an initial evaluation. She has Acquired spondylolisthesis; Essential (primary) hypertension; Acid reflux; HLD (hyperlipidemia); Adaptive colitis; Supraventricular tachycardia ([place]); Cough; Atypical chest pain; Chronic pain; Long term current use of opiate analgesic; Long term prescription opiate use; Opiate use (10 MME/day); Encounter for therapeutic drug level monitoring; Encounter for pain management planning; Osteopenia, senile; Abnormal MRI, thoracic spine; Thoracic central spinal stenosis (T7-8, T8-9, T9-10, T10-11, T11-12, and T12-L1); Thoracic foraminal stenosis (Moderate to severe at: Right T5; Bilateral T9; Left T11); Chronic thoracic radicular pain (T5/T6) (Left); History of lumbar fusion (12/19/2013) (posterior lumbar fusion from L2-L5); Failed back surgical syndrome; Thoracic facet syndrome (Left); History of fusion of cervical spine; Thoracic spondylosis with radiculopathy (Left); Hypomagnesemia; BP (high blood pressure); Postoperative back pain; Nerve root pain; Spinal stenosis of thoracic region; Status post lumbar spine operation; Musculoskeletal pain; and Neuropathic pain on her problem list.. Her primarily concern today is the Back Pain (upper back)  Pain Assessment: Self-Reported Pain Score: 6 /10             Reported level is compatible with observation.       Pain Type: Chronic pain Pain Location: Back Pain Orientation: Upper Pain Descriptors  / Indicators: Sharp Pain Frequency: Constant  The patient comes into the clinics today for pharmacological management of her chronic pain. I last saw this patient on 01/02/2016. The patient  reports that she does not use drugs. Her body mass index is 31.37 kg/m. Back surgery scheduled for Nov. 10th. Pending surgery on her back by Dr. Vertell Limber and Dr. Donnetta Hutching, on November 10th, 2017.  Date of Last Visit: 12/12/15 Service Provided on Last Visit: Evaluation  Controlled Substance Pharmacotherapy Assessment & REMS (Risk Evaluation and Mitigation Strategy)  Analgesic: Hydrocodone/APAP 5/325 one twice a day (10 mg/day of hydrocodone). Changed today to Oxycodone IR 5 mg 1 every 6 hours (20 mg/day) MME/day: 30 mg/day.  Pill Count: One hydrocodone 5/325 mg tablet was wasted down the toilet with witness of patient and Deno Lunger, RN and myself as instructed by Dr. Dossie Arbour Pharmacokinetics: Onset of action (Liberation/Absorption): Within expected pharmacological parameters Time to Peak effect (Distribution): Timing and results are as within normal expected parameters Duration of action (Metabolism/Excretion): Within normal limits for medication Pharmacodynamics: Analgesic Effect: More than 50% Activity Facilitation: Medication(s) allow patient to sit, stand, walk, and do the basic ADLs Perceived Effectiveness: Described as relatively effective, allowing for increase in activities of daily living (ADL) Side-effects or Adverse reactions: None reported Monitoring: [place] PMP: Online review of the past 22-month period conducted. Compliant with practice rules and regulations List of all UDS test(s) done:  Lab Results  Component Value Date   TOXASSSELUR FINAL 09/18/2015   SUMMARY FINAL 07/02/2015   Last UDS on record: ToxAssure Select 13  Date Value Ref Range Status  09/18/2015 FINAL  Final    Comment:    ==================================================================== TOXASSURE SELECT 13  (MW) ==================================================================== Test  Result       Flag       Units Drug Present and Declared for Prescription Verification   Hydrocodone                    1172         EXPECTED   ng/mg creat   Hydromorphone                  394          EXPECTED   ng/mg creat   Dihydrocodeine                 65           EXPECTED   ng/mg creat   Norhydrocodone                 668          EXPECTED   ng/mg creat    Sources of hydrocodone include scheduled prescription    medications. Hydromorphone, dihydrocodeine and norhydrocodone are    expected metabolites of hydrocodone. Hydromorphone and    dihydrocodeine are also available as scheduled prescription    medications. ==================================================================== Test                      Result    Flag   Units      Ref Range   Creatinine              229              mg/dL      >=20 ==================================================================== Declared Medications:  The flagging and interpretation on this report are based on the  following declared medications.  Unexpected results may arise from  inaccuracies in the declared medications.  **Note: The testing scope of this panel includes these medications:  Hydrocodone (Hydrocodone-Acetaminophen)  **Note: The testing scope of this panel does not include following  reported medications:  Acetaminophen (Hydrocodone-Acetaminophen)  Aspirin  Calcium citrate (Calcium citrate/Vitamin D)  Cholecalciferol  Clobetasol  Estradiol  Fluticasone  Hydrochlorothiazide (Valsartan-Hydrochlorothizide)  Meloxicam  Metoprolol  Multivitamin  Pravastatin  Valsartan (Valsartan-Hydrochlorothizide)  Vitamin D (Calcium citrate/Vitamin D) ==================================================================== For clinical consultation, please call [phone]. ====================================================================    Summary  Date Value Ref Range Status  07/02/2015 FINAL  Final    Comment:    ==================================================================== TOXASSURE COMP DRUG ANALYSIS,UR ==================================================================== Test                             Result       Flag       Units Drug Present and Declared for Prescription Verification   Hydrocodone                    216          EXPECTED   ng/mg creat   Hydromorphone                  256          EXPECTED   ng/mg creat   Dihydrocodeine                 59           EXPECTED   ng/mg creat   Norhydrocodone                 178  EXPECTED   ng/mg creat    Sources of hydrocodone include scheduled prescription    medications. Hydromorphone, dihydrocodeine and norhydrocodone are    expected metabolites of hydrocodone. Hydromorphone and    dihydrocodeine are also available as scheduled prescription    medications.   Cyclobenzaprine                PRESENT      EXPECTED   Metoprolol                     PRESENT      EXPECTED Drug Absent but Declared for Prescription Verification   Acetaminophen                  Not Detected UNEXPECTED    Acetaminophen, as indicated in the declared medication list, is    not always detected even when used as directed.   Salicylate                     Not Detected UNEXPECTED    Aspirin, as indicated in the declared medication list, is not    always detected even when used as directed. ==================================================================== Test                      Result    Flag   Units      Ref Range   Creatinine              176              mg/dL      >=20 ==================================================================== Declared Medications:  The flagging and interpretation on this report are based on the  following declared medications.  Unexpected results may arise  from  inaccuracies in the declared medications.  **Note: The testing scope of this panel includes these medications:  Cyclobenzaprine  Hydrocodone (Hydrocodone-Acetaminophen)  Metoprolol  **Note: The testing scope of this panel does not include small to  moderate amounts of these reported medications:  Acetaminophen (Hydrocodone-Acetaminophen)  Aspirin  **Note: The testing scope of this panel does not include following  reported medications:  Cholecalciferol  Estradiol  Fluticasone  Hydrochlorothiazide  Hydrochlorothiazide (Valsartan-Hydrochlorothizide)  Meloxicam  Multivitamin  Pravastatin  Valsartan (Valsartan-Hydrochlorothizide) ==================================================================== For clinical consultation, please call [phone]. ====================================================================    UDS interpretation: Compliant          Medication Assessment Form: Reviewed. Patient indicates being compliant with therapy Treatment compliance: Compliant Risk Assessment: Aberrant Behavior: None observed today Substance Use Disorder (SUD) Risk Level: No change since last visit Risk of opioid abuse or dependence: 0.7-3.0% with doses ? 36 MME/day and 6.1-26% with doses ? 120 MME/day. Opioid Risk Tool (ORT) Score: 0   Low Risk for SUD (Score <3)  Pharmacologic Plan: Therapy adjustment: Hydrocodone discontinued and the patient started on oxycodone, until she can have her surgery done. Once she undergoes her surgery, we will reassess her needs and possibly stop the medication.  Laboratory Chemistry  Inflammation Markers Lab Results  Component Value Date   ESRSEDRATE 39 (H) 07/03/2015   CRP 1.9 (H) 07/03/2015   Renal Function Lab Results  Component Value Date   BUN 13 04/12/2015   CREATININE 0.79 04/12/2015   GFRAA >60 04/12/2015   GFRNONAA >60 04/12/2015   Hepatic Function Lab Results  Component Value Date   AST 17 04/12/2015   ALT 12 (L)  04/12/2015   ALBUMIN 3.8 04/12/2015   Electrolytes Lab Results  Component Value  Date   NA 135 04/12/2015   K 3.5 04/12/2015   CL 94 (L) 04/12/2015   CALCIUM 10.1 04/12/2015   MG 1.5 (L) 07/03/2015   Pain Modulating Vitamins Lab Results  Component Value Date   VD25OH 43.1 07/03/2015   Coagulation Parameters Lab Results  Component Value Date   PLT 294 04/12/2015   Cardiovascular Lab Results  Component Value Date   HGB 13.3 04/12/2015   HCT 39.4 04/12/2015    Note: Lab results reviewed.  Recent Diagnostic Imaging  Mr Jeri Cos Wo Contrast  Result Date: 10/04/2015 CLINICAL DATA:  Recurrent headache and near syncopal episodes. EXAM: MRI HEAD WITHOUT AND WITH CONTRAST TECHNIQUE: Multiplanar, multiecho pulse sequences of the brain and surrounding structures were obtained without and with intravenous contrast. CONTRAST:  MultiHance 15 mL. COMPARISON:  None. FINDINGS: No evidence for acute infarction, hemorrhage, mass lesion, hydrocephalus, or extra-axial fluid. Global atrophy. Mild subcortical and periventricular T2 and FLAIR hyperintensities, likely chronic microvascular ischemic change. Pituitary, pineal, and cerebellar tonsils unremarkable. No upper cervical lesions. Flow voids are maintained throughout the carotid, basilar, and vertebral arteries. There are no areas of chronic hemorrhage. No sinus or mastoid disease. Negative orbits. BILATERAL cataract extraction. Post infusion, no abnormal enhancement of the brain or meninges. IMPRESSION: Atrophy and small vessel disease.  No acute intracranial findings. No cause is identified for the reported symptoms. Electronically Signed   By: Staci Righter M.D.   On: 10/04/2015 10:46   Meds  The patient has a current medication list which includes the following prescription(s): amoxicillin, aspirin ec, calcium citrate-vitamin d, clobetasol ointment, estradiol, fluticasone, meloxicam, metoprolol succinate, multiple vitamins-minerals, omeprazole,  oxycodone, oxycodone, oxycodone, pravastatin, and valsartan-hydrochlorothiazide.  Current Outpatient Prescriptions on File Prior to Visit  Medication Sig  . aspirin EC 81 MG tablet Take 81 mg by mouth daily.   . calcium citrate-vitamin D (CITRACAL+D) 315-200 MG-UNIT tablet Take by mouth daily.   . clobetasol ointment (TEMOVATE) 0.05 % APPLY TWICE DAILY TO SORE AREAS IN MOUTH UNTIL HEALED. AVOID FACE AND SKIN FOLDS.  Marland Kitchen estradiol (ESTRACE) 1 MG tablet Take 1 mg by mouth daily.   . fluticasone (FLONASE) 50 MCG/ACT nasal spray USE 2 PUFFS EACH NOSTRIL AT BEDTIME as needed  . meloxicam (MOBIC) 15 MG tablet TAKE 1 TABLET (15 MG TOTAL) BY MOUTH ONCE DAILY.  . metoprolol succinate (TOPROL-XL) 50 MG 24 hr tablet Take 50 mg by mouth daily.  . Multiple Vitamins-Minerals (OCUVITE EXTRA PO) Take 1 tablet by mouth daily.   Marland Kitchen omeprazole (PRILOSEC) 40 MG capsule Take 40 mg by mouth daily.   . pravastatin (PRAVACHOL) 80 MG tablet Take 80 mg by mouth at bedtime.  . valsartan-hydrochlorothiazide (DIOVAN-HCT) 160-12.5 MG per tablet Take 1 tablet by mouth daily.   No current facility-administered medications on file prior to visit.    ROS  Constitutional: Denies any fever or chills Gastrointestinal: No reported hemesis, hematochezia, vomiting, or acute GI distress Musculoskeletal: Denies any acute onset joint swelling, redness, loss of ROM, or weakness Neurological: No reported episodes of acute onset apraxia, aphasia, dysarthria, agnosia, amnesia, paralysis, loss of coordination, or loss of consciousness  Allergies  Allison Mclean is allergic to atorvastatin and rosuvastatin.  [place]  Medical:  Allison Mclean  has a past medical history of Allergy; GERD (gastroesophageal reflux disease); Hyperlipidemia; Hypertension; Infection; and [place] esophagus. Family: family history includes Arthritis in her mother; Asthma in her father; Diabetes in her father and mother; Heart disease in her father and mother;  Hypertension in  her father and mother; Stroke in her father. Surgical:  has a past surgical history that includes Cholecystectomy; Abdominal hysterectomy; Back surgery; Appendectomy; Tonsilectomy, adenoidectomy, bilateral myringotomy and tubes; and Cardiac catheterization (N/A, 02/07/2015). Tobacco:  reports that she has never smoked. She has never used smokeless tobacco. Alcohol:  reports that she does not drink alcohol. Drug:  reports that she does not use drugs.  Constitutional Exam  General appearance: Well nourished, well developed, and well hydrated. In no acute distress Vitals:   01/06/16 1059  BP: (!) 103/50  Pulse: 62  Resp: 18  Temp: 97.6 F (36.4 C)  SpO2: 100%  Weight: 166 lb (75.3 kg)  Height: 5\' 1"  (1.549 m)  BMI Assessment: Estimated body mass index is 31.37 kg/m as calculated from the following:   Height as of this encounter: 5\' 1"  (1.549 m).   Weight as of this encounter: 166 lb (75.3 kg).   BMI interpretation: (30-34.9 kg/m2) = Obese (Class I): This range is associated with a 68% higher incidence of chronic pain. BMI Readings from Last 4 Encounters:  01/06/16 31.37 kg/m  12/12/15 29.58 kg/m  11/21/15 29.94 kg/m  11/06/15 29.94 kg/m   Wt Readings from Last 4 Encounters:  01/06/16 166 lb (75.3 kg)  12/12/15 167 lb (75.8 kg)  11/21/15 169 lb (76.7 kg)  11/06/15 169 lb (76.7 kg)  Psych/Mental status: Alert and oriented x 3 (person, place, & time) Eyes: PERLA Respiratory: No evidence of acute respiratory distress  Cervical Spine Exam  Inspection: No masses, redness, or swelling Alignment: Symmetrical Functional ROM: Unrestricted ROM Stability: No instability detected Muscle strength & Tone: Functionally intact Sensory: Unimpaired Palpation: Non-contributory  Upper Extremity (UE) Exam    Side: Right upper extremity  Side: Left upper extremity  Inspection: No masses, redness, swelling, or asymmetry  Inspection: No masses, redness, swelling, or asymmetry   Functional ROM: Unrestricted ROM         Functional ROM: Unrestricted ROM          Muscle strength & Tone: Functionally intact  Muscle strength & Tone: Functionally intact  Sensory: Unimpaired  Sensory: Unimpaired  Palpation: Non-contributory  Palpation: Non-contributory   Thoracic Spine Exam  Inspection: No masses, redness, or swelling Alignment: Symmetrical Functional ROM: Unrestricted ROM Stability: No instability detected Sensory: Unimpaired Muscle strength & Tone: Functionally intact Palpation: Non-contributory  Lumbar Spine Exam  Inspection: No masses, redness, or swelling Alignment: Symmetrical Functional ROM: Limited ROM Stability: No instability detected Muscle strength & Tone: Functionally intact Sensory: Unimpaired Palpation: Non-contributory Provocative Tests: Lumbar Hyperextension and rotation test: evaluation deferred today       Patrick's Maneuver: evaluation deferred today              Gait & Posture Assessment  Ambulation: Unassisted Gait: Relatively normal for age and body habitus Posture: WNL   Lower Extremity Exam    Side: Right lower extremity  Side: Left lower extremity  Inspection: No masses, redness, swelling, or asymmetry  Inspection: No masses, redness, swelling, or asymmetry  Functional ROM: Unrestricted ROM          Functional ROM: Unrestricted ROM          Muscle strength & Tone: Functionally intact  Muscle strength & Tone: Functionally intact  Sensory: Unimpaired  Sensory: Unimpaired  Palpation: Non-contributory  Palpation: Non-contributory   Assessment  Primary Diagnosis & Pertinent Problem List: The primary encounter diagnosis was Other chronic pain. Diagnoses of Long term current use of opiate analgesic, Opiate use (10 MME/day), History  of lumbar fusion (12/19/2013) (posterior lumbar fusion from L2-L5), Failed back surgical syndrome, Thoracic central spinal stenosis (T7-8, T8-9, T9-10, T10-11, T11-12, and T12-L1), and Thoracic facet syndrome  (Left) were also pertinent to this visit.  Visit Diagnosis: 1. Other chronic pain   2. Long term current use of opiate analgesic   3. Opiate use (10 MME/day)   4. History of lumbar fusion (12/19/2013) (posterior lumbar fusion from L2-L5)   5. Failed back surgical syndrome   6. Thoracic central spinal stenosis (T7-8, T8-9, T9-10, T10-11, T11-12, and T12-L1)   7. Thoracic facet syndrome (Left)    Plan of Care  Pharmacotherapy (Medications Ordered): Meds ordered this encounter  Medications  . oxyCODONE (OXY IR/ROXICODONE) 5 MG immediate release tablet    Sig: Take 1 tablet (5 mg total) by mouth every 6 (six) hours as needed for severe pain.    Dispense:  120 tablet    Refill:  0    Do not add this medication to the electronic "Automatic Refill" notification system. Patient may have prescription filled one day early if pharmacy is closed on scheduled refill date. Do not fill until: 01/06/16 To last until: 02/05/16  . oxyCODONE (OXY IR/ROXICODONE) 5 MG immediate release tablet    Sig: Take 1 tablet (5 mg total) by mouth every 6 (six) hours as needed for severe pain.    Dispense:  120 tablet    Refill:  0    Do not add this medication to the electronic "Automatic Refill" notification system. Patient may have prescription filled one day early if pharmacy is closed on scheduled refill date. Do not fill until: 02/05/16 To last until: 03/06/16  . oxyCODONE (OXY IR/ROXICODONE) 5 MG immediate release tablet    Sig: Take 1 tablet (5 mg total) by mouth every 6 (six) hours as needed for severe pain.    Dispense:  120 tablet    Refill:  0    Do not add this medication to the electronic "Automatic Refill" notification system. Patient may have prescription filled one day early if pharmacy is closed on scheduled refill date. Do not fill until: 03/06/16 To last until: 04/05/16   New Prescriptions   OXYCODONE (OXY IR/ROXICODONE) 5 MG IMMEDIATE RELEASE TABLET    Take 1 tablet (5 mg total) by mouth  every 6 (six) hours as needed for severe pain.   OXYCODONE (OXY IR/ROXICODONE) 5 MG IMMEDIATE RELEASE TABLET    Take 1 tablet (5 mg total) by mouth every 6 (six) hours as needed for severe pain.   OXYCODONE (OXY IR/ROXICODONE) 5 MG IMMEDIATE RELEASE TABLET    Take 1 tablet (5 mg total) by mouth every 6 (six) hours as needed for severe pain.   Medications administered during this visit: Allison Mclean had no medications administered during this visit. Lab-work, Procedure(s), & Referral(s) Ordered: No orders of the defined types were placed in this encounter.  Imaging & Referral(s) Ordered: None  Interventional Therapies: Scheduled:  None at this time. Pending back surgery.    Considering:  None at this time.   PRN Procedures:  None at this time.   Requested PM Follow-up: Return in 3 months (on 04/09/2016) for Med-Mgmt.  Future Appointments Date Time Provider [place]  02/04/2016 11:30 AM Rosetta Posner, MD [facility]  04/09/2016 9:45 AM Milinda Pointer, MD [facility] None   Primary Care Physician: Idelle Crouch, MD Location: [facility] Note by: Kathlen Brunswick. Dossie Arbour, M.D, DABA, DABAPM, DABPM, DABIPP, FIPP  Pain Score  Disclaimer: We use the NRS-11 scale. This is a self-reported, subjective measurement of pain severity with only modest accuracy. It is used primarily to identify changes within a particular patient. It must be understood that outpatient pain scales are significantly less accurate that those used for research, where they can be applied under ideal controlled circumstances with minimal exposure to variables. In reality, the score is likely to be a combination of pain intensity and pain affect, where pain affect describes the degree of emotional arousal or changes in action readiness caused by the sensory experience of pain. Factors such as social and work situation, setting, emotional state, anxiety levels, expectation, and prior pain  experience may influence pain perception and show large inter-individual differences that may also be affected by time variables.  Patient instructions provided during this appointment: There are no Patient Instructions on file for this visit.

## 2016-01-13 ENCOUNTER — Other Ambulatory Visit: Payer: Self-pay

## 2016-01-20 ENCOUNTER — Encounter: Payer: Medicare Other | Admitting: Pain Medicine

## 2016-01-23 ENCOUNTER — Other Ambulatory Visit: Payer: Self-pay | Admitting: Physician Assistant

## 2016-01-23 DIAGNOSIS — R1013 Epigastric pain: Secondary | ICD-10-CM

## 2016-01-27 ENCOUNTER — Ambulatory Visit
Admission: RE | Admit: 2016-01-27 | Discharge: 2016-01-27 | Disposition: A | Discharge status: Home / Self Care | Payer: Medicare Other | Source: Ambulatory Visit | Attending: Physician Assistant | Admitting: Physician Assistant

## 2016-01-27 DIAGNOSIS — R1013 Epigastric pain: Secondary | ICD-10-CM | POA: Insufficient documentation

## 2016-01-27 DIAGNOSIS — Z9889 Other specified postprocedural states: Secondary | ICD-10-CM | POA: Insufficient documentation

## 2016-01-27 DIAGNOSIS — Q403 Congenital malformation of stomach, unspecified: Secondary | ICD-10-CM | POA: Diagnosis not present

## 2016-01-30 ENCOUNTER — Other Ambulatory Visit: Payer: Self-pay

## 2016-01-30 ENCOUNTER — Encounter: Payer: Self-pay | Admitting: Gastroenterology

## 2016-01-30 ENCOUNTER — Encounter: Payer: Self-pay | Admitting: Vascular Surgery

## 2016-01-30 ENCOUNTER — Encounter: Payer: Self-pay | Admitting: *Deleted

## 2016-01-30 ENCOUNTER — Ambulatory Visit (INDEPENDENT_AMBULATORY_CARE_PROVIDER_SITE_OTHER): Payer: Medicare Other | Admitting: Gastroenterology

## 2016-01-30 VITALS — BP 152/66 | HR 78 | Temp 97.5°F | Ht 61.0 in | Wt 168.0 lb

## 2016-01-30 DIAGNOSIS — R933 Abnormal findings on diagnostic imaging of other parts of digestive tract: Secondary | ICD-10-CM

## 2016-01-30 NOTE — Progress Notes (Signed)
Primary Care Physician: Idelle Crouch, MD  Primary Gastroenterologist:  Dr. Lucilla Lame  Chief Complaint  Patient presents with  . Possible peptic ulcer    HPI: Allison Mclean is a 80 y.o. female here for abnormal imaging. The patient was having abdominal pain and was found on imaging to have deformities of the pylorus. The patient has chronic back pain and is on pain medication and Mobic. There is no report of any black stools or bloody stools. She was diagnosed in the past with nutcracker's esophagus but states that that has not been bothering her recently.    Current Outpatient Prescriptions  Medication Sig Dispense Refill  . aspirin EC 81 MG tablet Take 81 mg by mouth daily.     . calcium citrate-vitamin D (CITRACAL+D) 315-200 MG-UNIT tablet Take by mouth daily.     . clobetasol ointment (TEMOVATE) 0.05 % APPLY TWICE DAILY TO SORE AREAS IN MOUTH UNTIL HEALED. AVOID FACE AND SKIN FOLDS.  1  . esomeprazole (NEXIUM) 20 MG capsule Take by mouth.    . estradiol (ESTRACE) 1 MG tablet Take 1 mg by mouth daily.     . fluticasone (FLONASE) 50 MCG/ACT nasal spray USE 2 PUFFS EACH NOSTRIL AT BEDTIME as needed    . HYDROcodone-acetaminophen (NORCO/VICODIN) 5-325 MG tablet     . meloxicam (MOBIC) 15 MG tablet TAKE 1 TABLET (15 MG TOTAL) BY MOUTH ONCE DAILY.    . metoprolol succinate (TOPROL-XL) 50 MG 24 hr tablet Take 50 mg by mouth daily.  3  . Multiple Vitamins-Minerals (OCUVITE EXTRA PO) Take 1 tablet by mouth daily.     Marland Kitchen nystatin (MYCOSTATIN/NYSTOP) powder Apply topically.    . NYSTATIN powder     . oxyCODONE (OXY IR/ROXICODONE) 5 MG immediate release tablet Take 1 tablet (5 mg total) by mouth every 6 (six) hours as needed for severe pain. 120 tablet 0  . [START ON 02/05/2016] oxyCODONE (OXY IR/ROXICODONE) 5 MG immediate release tablet Take 1 tablet (5 mg total) by mouth every 6 (six) hours as needed for severe pain. 120 tablet 0  . [START ON 03/06/2016] oxyCODONE (OXY IR/ROXICODONE) 5  MG immediate release tablet Take 1 tablet (5 mg total) by mouth every 6 (six) hours as needed for severe pain. 120 tablet 0  . polyethylene glycol powder (GLYCOLAX/MIRALAX) powder Take by mouth.    . polyethylene glycol powder (GLYCOLAX/MIRALAX) powder     . pravastatin (PRAVACHOL) 80 MG tablet Take 80 mg by mouth at bedtime.  3  . valsartan-hydrochlorothiazide (DIOVAN-HCT) 160-12.5 MG per tablet Take 1 tablet by mouth daily.  3  . amoxicillin (AMOXIL) 500 MG capsule     . omeprazole (PRILOSEC) 40 MG capsule Take 40 mg by mouth daily.     Marland Kitchen senna-docusate (SENOKOT-S) 8.6-50 MG tablet Take by mouth.     No current facility-administered medications for this visit.     Allergies as of 01/30/2016 - Review Complete 01/30/2016  Allergen Reaction Noted  . Atorvastatin Other (See Comments) 04/24/2015  . Rosuvastatin Other (See Comments) 04/16/2014    ROS:  General: Negative for anorexia, weight loss, fever, chills, fatigue, weakness. ENT: Negative for hoarseness, difficulty swallowing , nasal congestion. CV: Negative for chest pain, angina, palpitations, dyspnea on exertion, peripheral edema.  Respiratory: Negative for dyspnea at rest, dyspnea on exertion, cough, sputum, wheezing.  GI: See history of present illness. GU:  Negative for dysuria, hematuria, urinary incontinence, urinary frequency, nocturnal urination.  Endo: Negative for unusual weight change.  Physical Examination:   BP (!) 152/66   Pulse 78   Temp 97.5 F (36.4 C) (Oral)   Ht 5\' 1"  (1.549 m)   Wt 168 lb (76.2 kg)   BMI 31.74 kg/m   General: Well-nourished, well-developed in no acute distress.  Eyes: No icterus. Conjunctivae pink. Neuro: Alert and oriented x 3.  Grossly intact. Skin: Warm and dry, no jaundice.   Psych: Alert and cooperative, normal mood and affect.  Labs:    Imaging Studies: Dg Ugi W/small Bowel  Result Date: 01/27/2016 CLINICAL DATA:  Abdominal pain. EXAM: UPPER GI SERIES WITH SMALL BOWEL  FOLLOW-THROUGH FLUOROSCOPY TIME:  Fluoroscopy Time:  4 minutes 12 seconds. Radiation Exposure Index (if provided by the fluoroscopic device): 240.7 mGy Number of Acquired Spot Images: 29 TECHNIQUE: Combined double contrast and single contrast upper GI series using effervescent crystals, thick barium, and thin barium. Subsequently, serial images of the small bowel were obtained including spot views of the terminal ileum. COMPARISON:  CT 04/12/2015 . FINDINGS: Prior cholecystectomy . Prior lumbar spine surgery. Esophagus is widely patent. Prior fundoplication . Body of the stomach appears normal. Deformity is noted of the pylorus. A pyloric peptic ulcer cannot be excluded. Duodenum and C-loop are unremarkable. Small-bowel appears unremarkable. No evidence of focal small bowel lesion. No evidence small-bowel obstruction. Terminal ileum appears normal. IMPRESSION: 1. Deformity noted of the pylorus. A pyloric peptic ulcer cannot be excluded. Endoscopic evaluation is suggested. 2. Prior fundoplication.  Small-bowel follow-through appears normal. Electronically Signed   By: Marcello Moores  Register   On: 01/27/2016 13:11    Assessment and Plan:   Allison Mclean is a 80 y.o. y/o female who comes today with a abnormal finding on x-ray consistent with a possible large ulcer. The patient is on Mobic for back pain. The patient will be set up for an EGD. I have discussed risks & benefits which include, but are not limited to, bleeding, infection, perforation & drug reaction.  The patient agrees with this plan & written consent will be obtained.      Note: This dictation was prepared with Dragon dictation along with smaller phrase technology. Any transcriptional errors that result from this process are unintentional.

## 2016-01-31 NOTE — Discharge Instructions (Signed)

## 2016-02-03 ENCOUNTER — Ambulatory Visit: Payer: Medicare Other | Admitting: Student in an Organized Health Care Education/Training Program

## 2016-02-03 ENCOUNTER — Encounter
Admission: RE | Disposition: A | Discharge status: Home / Self Care | Payer: Self-pay | Source: Ambulatory Visit | Attending: Gastroenterology

## 2016-02-03 ENCOUNTER — Ambulatory Visit
Admission: RE | Admit: 2016-02-03 | Discharge: 2016-02-03 | Disposition: A | Discharge status: Home / Self Care | Payer: Medicare Other | Source: Ambulatory Visit | Attending: Gastroenterology | Admitting: Gastroenterology

## 2016-02-03 DIAGNOSIS — Z791 Long term (current) use of non-steroidal anti-inflammatories (NSAID): Secondary | ICD-10-CM | POA: Diagnosis not present

## 2016-02-03 DIAGNOSIS — I10 Essential (primary) hypertension: Secondary | ICD-10-CM | POA: Insufficient documentation

## 2016-02-03 DIAGNOSIS — R198 Other specified symptoms and signs involving the digestive system and abdomen: Secondary | ICD-10-CM

## 2016-02-03 DIAGNOSIS — K219 Gastro-esophageal reflux disease without esophagitis: Secondary | ICD-10-CM | POA: Diagnosis not present

## 2016-02-03 DIAGNOSIS — K3189 Other diseases of stomach and duodenum: Secondary | ICD-10-CM | POA: Insufficient documentation

## 2016-02-03 DIAGNOSIS — Z79890 Hormone replacement therapy (postmenopausal): Secondary | ICD-10-CM | POA: Insufficient documentation

## 2016-02-03 DIAGNOSIS — K315 Obstruction of duodenum: Secondary | ICD-10-CM | POA: Diagnosis not present

## 2016-02-03 DIAGNOSIS — E785 Hyperlipidemia, unspecified: Secondary | ICD-10-CM | POA: Diagnosis not present

## 2016-02-03 DIAGNOSIS — Z981 Arthrodesis status: Secondary | ICD-10-CM | POA: Diagnosis not present

## 2016-02-03 DIAGNOSIS — R1900 Intra-abdominal and pelvic swelling, mass and lump, unspecified site: Secondary | ICD-10-CM | POA: Insufficient documentation

## 2016-02-03 DIAGNOSIS — M8580 Other specified disorders of bone density and structure, unspecified site: Secondary | ICD-10-CM | POA: Insufficient documentation

## 2016-02-03 DIAGNOSIS — R933 Abnormal findings on diagnostic imaging of other parts of digestive tract: Secondary | ICD-10-CM | POA: Diagnosis not present

## 2016-02-03 HISTORY — PX: ESOPHAGOGASTRODUODENOSCOPY (EGD) WITH PROPOFOL: SHX5813

## 2016-02-03 HISTORY — DX: Cardiac murmur, unspecified: R01.1

## 2016-02-03 SURGERY — ESOPHAGOGASTRODUODENOSCOPY (EGD) WITH PROPOFOL
Anesthesia: Monitor Anesthesia Care | Site: Throat | Wound class: Clean Contaminated

## 2016-02-03 MED ORDER — GLYCOPYRROLATE 0.2 MG/ML IJ SOLN
INTRAMUSCULAR | Status: DC | PRN
  Administered 2016-02-03: 0.2 mg via INTRAVENOUS

## 2016-02-03 MED ORDER — PROPOFOL 10 MG/ML IV BOLUS
INTRAVENOUS | Status: DC | PRN
  Administered 2016-02-03 (×2): 30 mg via INTRAVENOUS
  Administered 2016-02-03: 20 mg via INTRAVENOUS
  Administered 2016-02-03: 70 mg via INTRAVENOUS

## 2016-02-03 MED ORDER — LACTATED RINGERS IV SOLN
INTRAVENOUS | Status: DC
  Administered 2016-02-03: 10:00:00 via INTRAVENOUS

## 2016-02-03 MED ORDER — STERILE WATER FOR IRRIGATION IR SOLN
Status: DC | PRN
  Administered 2016-02-03: 25 mL

## 2016-02-03 SURGICAL SUPPLY — 32 items

## 2016-02-03 NOTE — Anesthesia Procedure Notes (Signed)
Procedure Name: MAC Performed by: Tenita Cue Pre-anesthesia Checklist: Patient identified, Emergency Drugs available, Suction available, Timeout performed and Patient being monitored Patient Re-evaluated:Patient Re-evaluated prior to inductionOxygen Delivery Method: Nasal cannula Placement Confirmation: positive ETCO2       

## 2016-02-03 NOTE — Op Note (Signed)
[facility] Patient Name: Allison Mclean Procedure Date: 02/03/2016 10:51 AM MRN: [id number] Account #: [id number] Date of Birth: [birth year] Admit Type: Outpatient Age: 80 Room: [facility] OR ROOM 01 Gender: Female Note Status: Finalized Procedure:            Upper GI endoscopy Indications:          Abnormal CT of the GI tract Providers:            Lucilla Lame MD, MD Referring MD:         Leonie Douglas. Doy Hutching, MD (Referring MD) Medicines:            Propofol per Anesthesia Complications:        No immediate complications. Procedure:            Pre-Anesthesia Assessment:                       - Prior to the procedure, a History and Physical was                        performed, and patient medications and allergies were                        reviewed. The patient's tolerance of previous                        anesthesia was also reviewed. The risks and benefits of                        the procedure and the sedation options and risks were                        discussed with the patient. All questions were                        answered, and informed consent was obtained. Prior                        Anticoagulants: The patient has taken no previous                        anticoagulant or antiplatelet agents. ASA Grade                        Assessment: II - A patient with mild systemic disease.                        After reviewing the risks and benefits, the patient was                        deemed in satisfactory condition to undergo the                        procedure.                       After obtaining informed consent, the endoscope was                        passed under direct vision. Throughout the procedure,  the patient's blood pressure, pulse, and oxygen                        saturations were monitored continuously. The Olympus                        [id number]-HQ190 Endoscope (S#. [id number]) was introduced          through the mouth, and advanced to the duodenal bulb.                        The upper GI endoscopy was accomplished without                        difficulty. The patient tolerated the procedure well. Findings:      The examined esophagus was normal.      A large mass with no bleeding was found in the duodenal bulb. Biopsies       were taken with a cold forceps for histology.      An acquired severe stenosis was found in the duodenal bulb and was       non-traversed.      Evidence of a Nissen fundoplication was found in the gastric fundus. Impression:           - Normal esophagus.                       - Duodenal mass. Biopsied.                       - Acquired duodenal stenosis.                       - A Nissen fundoplication was found. Recommendation:       - Await pathology results.                       - Discharge patient to home.                       - Resume previous diet.                       - Continue present medications. Procedure Code(s):    --- Professional ---                       [phone], Esophagogastroduodenoscopy, flexible, transoral;                        with biopsy, single or multiple Diagnosis Code(s):    --- Professional ---                       R93.3, Abnormal findings on diagnostic imaging of other                        parts of digestive tract                       K31.5, Obstruction of duodenum                       K31.89, Other diseases of stomach and duodenum CPT copyright 2016 American Medical Association. All rights  reserved. The codes documented in this report are preliminary and upon coder review may  be revised to meet current compliance requirements. Lucilla Lame MD, MD 02/03/2016 11:17:05 AM This report has been signed electronically. Number of Addenda: 0 Note Initiated On: 02/03/2016 10:51 AM      [facility]

## 2016-02-03 NOTE — Anesthesia Preprocedure Evaluation (Addendum)
Anesthesia Evaluation  Patient identified by MRN, date of birth, ID band Patient awake    Airway Mallampati: II  TM Distance: >3 FB Neck ROM: Full    Dental   Pulmonary    Pulmonary exam normal        Cardiovascular hypertension, Pt. on medications and Pt. on home beta blockers + Valvular Problems/Murmurs (Asymptomatic) MVP  Rhythm:Regular Rate:Normal + Systolic murmurs    Neuro/Psych    GI/Hepatic GERD  Medicated and Controlled,  Endo/Other    Renal/GU      Musculoskeletal  (+) Arthritis , Osteoarthritis,    Abdominal   Peds  Hematology   Anesthesia Other Findings   Reproductive/Obstetrics                             Anesthesia Physical Anesthesia Plan  ASA: III  Anesthesia Plan: MAC   Post-op Pain Management:    Induction: Intravenous  Airway Management Planned: Natural Airway  Additional Equipment:   Intra-op Plan:   Post-operative Plan:   Informed Consent: I have reviewed the patients History and Physical, chart, labs and discussed the procedure including the risks, benefits and alternatives for the proposed anesthesia with the patient or authorized representative who has indicated his/her understanding and acceptance.     Plan Discussed with: CRNA  Anesthesia Plan Comments:         Anesthesia Quick Evaluation

## 2016-02-03 NOTE — H&P (Signed)
Lucilla Lame, MD [facility] [address] [place], [place] [address] Phone: [phone] Fax : [phone]  Primary Care Physician:  Idelle Crouch, MD Primary Gastroenterologist:  Dr. Allen Norris  Pre-Procedure History & Physical: HPI:  Allison Mclean is a 80 y.o. female is here for an endoscopy.   Past Medical History:  Diagnosis Date  . Abnormal MRI, thoracic spine 07/02/2015   IMPRESSION: 1. 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 Subsequent moderate to severe adjacent segment facet arthropathy. 2. Widespread thoracic spine disc bulging. Occasional small superimposed disc protrusions (T8-9, T11-12). 3. Subsequent multifactorial mild thoracic spinal stenosis T7-8, T8-9, T9-10, T10-11, T11-12, and T12-L1. Up to mild spi  . Acid reflux 11/22/2014  . 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 Allergy   . Chronic thoracic radicular pain (T5/T6) (Left) 07/02/2015  . Essential (primary) hypertension 12/23/2013  . GERD (gastroesophageal reflux disease)   . Heart murmur   . History of fusion of cervical spine 07/02/2015  . History of lumbar fusion (12/19/2013) (posterior lumbar fusion from L2-L5) 07/02/2015   Pedicle screws and posterior hardware at L5 L4-L3 and L2. Neil disc block at the L4-5 disc space. Slight anterior subluxation of L4 on L5. Different configuration and this had L3-4. Degenerative changes of the lumbar disc spaces. L5-S1 disc space well-maintained .   Marland Kitchen HLD (hyperlipidemia) 11/07/2013  . Hyperlipidemia   . Hypertension   . Infection    abcess in left upper tooth   . Jackhammer esophagus   . Nerve root pain 07/02/2015   Thoracic pain is likely to be secondary to severe left T5 foraminal stenosis.   . Neuropathic pain 08/05/2015  . Opiate use (10 MME/day) 07/02/2015  . Osteopenia, senile 07/02/2015  . Supraventricular tachycardia ([place]) 11/07/2013  . Thoracic central spinal stenosis (T7-8, T8-9, T9-10, T10-11, T11-12, and T12-L1) 07/02/2015  .  Thoracic facet syndrome (Left) 07/02/2015  . Thoracic foraminal stenosis (Moderate to severe at: Right T5; Bilateral T9; Left T11) 07/02/2015  . Thoracic spondylosis with radiculopathy (Left) 07/09/2015    Past Surgical History:  Procedure Laterality Date  . ABDOMINAL HYSTERECTOMY    . APPENDECTOMY    . BACK SURGERY     2005, 2006, 2015  . CARDIAC CATHETERIZATION N/A 02/07/2015   Procedure: Left Heart Cath and Coronary Angiography;  Surgeon: Teodoro Spray, MD;  Location: [facility];  Service: Cardiovascular;  Laterality: N/A;  . CHOLECYSTECTOMY    . TONSILECTOMY, ADENOIDECTOMY, BILATERAL MYRINGOTOMY AND TUBES      Prior to Admission medications   Medication Sig Start Date End Date Taking? Authorizing Provider  amoxicillin (AMOXIL) 500 MG capsule  01/01/16   Historical Provider, MD  aspirin EC 81 MG tablet Take 81 mg by mouth daily.  08/02/06   Historical Provider, MD  calcium citrate-vitamin D (CITRACAL+D) 315-200 MG-UNIT tablet Take by mouth daily.     Historical Provider, MD  clobetasol ointment (TEMOVATE) 0.05 % APPLY TWICE DAILY TO SORE AREAS IN MOUTH UNTIL HEALED. AVOID FACE AND SKIN FOLDS. 04/29/15   Historical Provider, MD  esomeprazole (NEXIUM) 20 MG capsule Take by mouth. 01/23/16 01/22/17  Historical Provider, MD  estradiol (ESTRACE) 1 MG tablet Take 1 mg by mouth daily.     Historical Provider, MD  fluticasone Asencion Islam) 50 MCG/ACT nasal spray USE 2 PUFFS EACH NOSTRIL AT BEDTIME as needed 03/05/14   Historical Provider, MD  HYDROcodone-acetaminophen (NORCO/VICODIN) 5-325 MG tablet  12/06/15   Historical Provider, MD  meloxicam (  MOBIC) 15 MG tablet TAKE 1 TABLET (15 MG TOTAL) BY MOUTH ONCE DAILY. 08/26/15   Historical Provider, MD  metoprolol succinate (TOPROL-XL) 50 MG 24 hr tablet Take 50 mg by mouth daily. 10/10/14   Historical Provider, MD  Multiple Vitamins-Minerals (OCUVITE EXTRA PO) Take 1 tablet by mouth daily.     Historical Provider, MD  nystatin (MYCOSTATIN/NYSTOP)  powder Apply topically. 01/23/16 01/22/17  Historical Provider, MD  NYSTATIN powder  01/23/16   Historical Provider, MD  omeprazole (PRILOSEC) 40 MG capsule Take 40 mg by mouth daily.     Historical Provider, MD  oxyCODONE (OXY IR/ROXICODONE) 5 MG immediate release tablet Take 1 tablet (5 mg total) by mouth every 6 (six) hours as needed for severe pain. 01/06/16 02/05/16  Milinda Pointer, MD  oxyCODONE (OXY IR/ROXICODONE) 5 MG immediate release tablet Take 1 tablet (5 mg total) by mouth every 6 (six) hours as needed for severe pain. 02/05/16 03/06/16  Milinda Pointer, MD  oxyCODONE (OXY IR/ROXICODONE) 5 MG immediate release tablet Take 1 tablet (5 mg total) by mouth every 6 (six) hours as needed for severe pain. 03/06/16 04/05/16  Milinda Pointer, MD  polyethylene glycol powder [facility]) powder Take by mouth. 01/21/16 02/20/16  Historical Provider, MD  polyethylene glycol powder (GLYCOLAX/MIRALAX) powder  01/21/16   Historical Provider, MD  pravastatin (PRAVACHOL) 80 MG tablet Take 80 mg by mouth at bedtime. 09/15/14   Historical Provider, MD  senna-docusate (SENOKOT-S) 8.6-50 MG tablet Take by mouth. 01/21/16 01/20/17  Historical Provider, MD  valsartan-hydrochlorothiazide (DIOVAN-HCT) 160-12.5 MG per tablet Take 1 tablet by mouth daily. 10/29/14   Historical Provider, MD    Allergies as of 01/30/2016 - Review Complete 01/30/2016  Allergen Reaction Noted  . Atorvastatin Other (See Comments) 04/24/2015  . Rosuvastatin Other (See Comments) 04/16/2014    Family History  Problem Relation Age of Onset  . Diabetes Mother   . Arthritis Mother   . Heart disease Mother   . Hypertension Mother   . Diabetes Father   . Asthma Father   . Heart disease Father   . Hypertension Father   . Stroke Father   . Breast cancer Neg Hx     Social History   Social History  . Marital status: Married    Spouse name: N/A  . Number of children: N/A  . Years of education: N/A   Occupational  History  . Not on file.   Social History Main Topics  . Smoking status: Never Smoker  . Smokeless tobacco: Never Used  . Alcohol use No  . Drug use: No  . Sexual activity: Not on file   Other Topics Concern  . Not on file   Social History Narrative  . No narrative on file    Review of Systems: See HPI, otherwise negative ROS  Physical Exam: Ht 5\' 1"  (1.549 m)   Wt 168 lb (76.2 kg)   BMI 31.74 kg/m  General:   Alert,  pleasant and cooperative in NAD Head:  Normocephalic and atraumatic. Neck:  Supple; no masses or thyromegaly. Lungs:  Clear throughout to auscultation.    Heart:  Regular rate and rhythm. Abdomen:  Soft, nontender and nondistended. Normal bowel sounds, without guarding, and without rebound.   Neurologic:  Alert and  oriented x4;  grossly normal neurologically.  Impression/Plan: Allison Mclean is here for an endoscopy to be performed for abnormal CT  Risks, benefits, limitations, and alternatives regarding  endoscopy have been reviewed with the patient.  Questions  have been answered.  All parties agreeable.   Lucilla Lame, MD  02/03/2016, 9:56 AM

## 2016-02-03 NOTE — Transfer of Care (Signed)
Immediate Anesthesia Transfer of Care Note  Patient: Allison Mclean  Procedure(s) Performed: Procedure(s): ESOPHAGOGASTRODUODENOSCOPY (EGD) WITH PROPOFOL (N/A)  Patient Location: PACU  Anesthesia Type: MAC  Level of Consciousness: awake, alert  and patient cooperative  Airway and Oxygen Therapy: Patient Spontanous Breathing and Patient connected to supplemental oxygen  Post-op Assessment: Post-op Vital signs reviewed, Patient's Cardiovascular Status Stable, Respiratory Function Stable, Patent Airway and No signs of Nausea or vomiting  Post-op Vital Signs: Reviewed and stable  Complications: No apparent anesthesia complications

## 2016-02-03 NOTE — Anesthesia Postprocedure Evaluation (Signed)
Anesthesia Post Note  Patient: Allison Mclean  Procedure(s) Performed: Procedure(s) (LRB): ESOPHAGOGASTRODUODENOSCOPY (EGD) WITH PROPOFOL (N/A)  Patient location during evaluation: PACU Anesthesia Type: MAC Level of consciousness: awake and alert Pain management: pain level controlled Vital Signs Assessment: post-procedure vital signs reviewed and stable Respiratory status: spontaneous breathing, nonlabored ventilation, respiratory function stable and patient connected to nasal cannula oxygen Cardiovascular status: stable and blood pressure returned to baseline Anesthetic complications: no    Marshell Levan

## 2016-02-04 ENCOUNTER — Encounter: Payer: Self-pay | Admitting: Gastroenterology

## 2016-02-04 ENCOUNTER — Ambulatory Visit (INDEPENDENT_AMBULATORY_CARE_PROVIDER_SITE_OTHER): Payer: Medicare Other | Admitting: Vascular Surgery

## 2016-02-04 VITALS — BP 138/70 | HR 72 | Temp 97.3°F | Resp 16 | Ht 61.0 in | Wt 164.0 lb

## 2016-02-04 DIAGNOSIS — M5137 Other intervertebral disc degeneration, lumbosacral region: Secondary | ICD-10-CM | POA: Diagnosis not present

## 2016-02-04 NOTE — Progress Notes (Signed)
[facility] [place]  Patient name: Allison Mclean MRN: [id number] DOB: 09/23/1935 Sex: female  REASON FOR CONSULT: Discussed my role in anterior exposure for L5-S1 disc surgery  HPI: Allison Mclean is a 80 y.o. female, who is here today for discussion of upcoming L5-S1 disc surgery. She has a long history of severe degenerative disc disease with a prior posterior back surgery. He has a multilevel disease in her thoracic and lumbar sacral spine. She has failed conservative therapy and continues to have severe back pain associated with this. She does have some difficulty walking related to this will cane and she has no history of peripheral vascular occlusive disease. Interestingly does have a remote history of sympathectomy for rate nontender lower extremities did report improvement with this. An surgery was in the 1980s. She did have upper endoscopy yesterday for evaluation of a duodenal mass and had biopsies with final results pending. Reportedly the GI physician was concerned about the possibility of cancer associated with this. She and her husband are understandably quite concerned about this. She has multiple prior abdominal surgeries including open hysterectomy appendectomy and open cholecystectomy.  Past Medical History:  Diagnosis Date  . Abnormal MRI, thoracic spine 07/02/2015   IMPRESSION: 1. 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 Subsequent moderate to severe adjacent segment facet arthropathy. 2. Widespread thoracic spine disc bulging. Occasional small superimposed disc protrusions (T8-9, T11-12). 3. Subsequent multifactorial mild thoracic spinal stenosis T7-8, T8-9, T9-10, T10-11, T11-12, and T12-L1. Up to mild spi  . Acid reflux 11/22/2014  . 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 Allergy   . Chronic thoracic radicular pain (T5/T6) (Left) 07/02/2015  . Essential (primary) hypertension  12/23/2013  . GERD (gastroesophageal reflux disease)   . Heart murmur   . History of fusion of cervical spine 07/02/2015  . History of lumbar fusion (12/19/2013) (posterior lumbar fusion from L2-L5) 07/02/2015   Pedicle screws and posterior hardware at L5 L4-L3 and L2. Neil disc block at the L4-5 disc space. Slight anterior subluxation of L4 on L5. Different configuration and this had L3-4. Degenerative changes of the lumbar disc spaces. L5-S1 disc space well-maintained .   Marland Kitchen HLD (hyperlipidemia) 11/07/2013  . Hyperlipidemia   . Hypertension   . Infection    abcess in left upper tooth   . Jackhammer esophagus   . Nerve root pain 07/02/2015   Thoracic pain is likely to be secondary to severe left T5 foraminal stenosis.   . Neuropathic pain 08/05/2015  . Opiate use (10 MME/day) 07/02/2015  . Osteopenia, senile 07/02/2015  . Supraventricular tachycardia ([place]) 11/07/2013  . Thoracic central spinal stenosis (T7-8, T8-9, T9-10, T10-11, T11-12, and T12-L1) 07/02/2015  . Thoracic facet syndrome (Left) 07/02/2015  . Thoracic foraminal stenosis (Moderate to severe at: Right T5; Bilateral T9; Left T11) 07/02/2015  . Thoracic spondylosis with radiculopathy (Left) 07/09/2015    Family History  Problem Relation Age of Onset  . Diabetes Mother   . Arthritis Mother   . Heart disease Mother   . Hypertension Mother   . Diabetes Father   . Asthma Father   . Heart disease Father   . Hypertension Father   . Stroke Father   . Breast cancer Neg Hx     SOCIAL HISTORY: Social History   Social History  . Marital status: Married    Spouse name: N/A  . Number of children: N/A  . Years of education: N/A  Occupational History  . Not on file.   Social History Main Topics  . Smoking status: Never Smoker  . Smokeless tobacco: Never Used  . Alcohol use No  . Drug use: No  . Sexual activity: Not on file   Other Topics Concern  . Not on file   Social History Narrative  . No narrative on file    Allergies   Allergen Reactions  . Atorvastatin Other (See Comments)    Chest pain    . Rosuvastatin Other (See Comments)    Causes chest pain      Current Outpatient Prescriptions  Medication Sig Dispense Refill  . aspirin EC 81 MG tablet Take 81 mg by mouth daily.     . calcium citrate-vitamin D (CITRACAL+D) 315-200 MG-UNIT tablet Take by mouth daily.     . clobetasol ointment (TEMOVATE) 0.05 % APPLY TWICE DAILY TO SORE AREAS IN MOUTH UNTIL HEALED. AVOID FACE AND SKIN FOLDS.  1  . esomeprazole (NEXIUM) 20 MG capsule Take by mouth.    . estradiol (ESTRACE) 1 MG tablet Take 1 mg by mouth daily.     . fluticasone (FLONASE) 50 MCG/ACT nasal spray USE 2 PUFFS EACH NOSTRIL AT BEDTIME as needed    . HYDROcodone-acetaminophen (NORCO/VICODIN) 5-325 MG tablet     . meloxicam (MOBIC) 15 MG tablet TAKE 1 TABLET (15 MG TOTAL) BY MOUTH ONCE DAILY.    . metoprolol succinate (TOPROL-XL) 50 MG 24 hr tablet Take 50 mg by mouth daily.  3  . Multiple Vitamins-Minerals (OCUVITE EXTRA PO) Take 1 tablet by mouth daily.     Marland Kitchen nystatin (MYCOSTATIN/NYSTOP) powder Apply topically.    . NYSTATIN powder     . omeprazole (PRILOSEC) 40 MG capsule Take 40 mg by mouth daily.     Derrill Memo ON 02/05/2016] oxyCODONE (OXY IR/ROXICODONE) 5 MG immediate release tablet Take 1 tablet (5 mg total) by mouth every 6 (six) hours as needed for severe pain. 120 tablet 0  . [START ON 03/06/2016] oxyCODONE (OXY IR/ROXICODONE) 5 MG immediate release tablet Take 1 tablet (5 mg total) by mouth every 6 (six) hours as needed for severe pain. 120 tablet 0  . polyethylene glycol powder (GLYCOLAX/MIRALAX) powder Take by mouth.    . pravastatin (PRAVACHOL) 80 MG tablet Take 80 mg by mouth at bedtime.  3  . senna-docusate (SENOKOT-S) 8.6-50 MG tablet Take by mouth.    . valsartan-hydrochlorothiazide (DIOVAN-HCT) 160-12.5 MG per tablet Take 1 tablet by mouth daily.  3   No current facility-administered medications for this visit.     REVIEW OF SYSTEMS:    [X]  denotes positive finding, [ ]  denotes negative finding Cardiac  Comments:  Chest pain or chest pressure:     Shortness of breath upon exertion:    Short of breath when lying flat:    Irregular heart rhythm:        Vascular    Pain in calf, thigh, or hip brought on by ambulation:    Pain in feet at night that wakes you up from your sleep:     Blood clot in your veins:    Leg swelling:         Pulmonary    Oxygen at home:    Productive cough:     Wheezing:         Neurologic    Sudden weakness in arms or legs:     Sudden numbness in arms or legs:     Sudden onset of  difficulty speaking or slurred speech:    Temporary loss of vision in one eye:     Problems with dizziness:         Gastrointestinal    Blood in stool:     Vomited blood:         Genitourinary    Burning when urinating:     Blood in urine:        Psychiatric    Major depression:         Hematologic    Bleeding problems:    Problems with blood clotting too easily:        Skin    Rashes or ulcers:        Constitutional    Fever or chills:      PHYSICAL EXAM: Vitals:   02/04/16 1115  BP: 138/70  Pulse: 72  Resp: 16  Temp: 97.3 F (36.3 C)  TempSrc: Oral  SpO2: 100%  Weight: 164 lb (74.4 kg)  Height: 5\' 1"  (1.549 m)    GENERAL: The patient is a well-nourished female, in no acute distress. The vital signs are documented above. CARDIOVASCULAR: 2+ radial pulses bilaterally. 1-2+ left dorsalis pedis pulse. 1+ right dorsalis pedis pulse PULMONARY: There is good air exchange  ABDOMEN: Soft and non-tender  MUSCULOSKELETAL: There are no major deformities or cyanosis. He is wearing a back brace. NEUROLOGIC: No focal weakness or paresthesias are detected. SKIN: There are no ulcers or rashes noted. PSYCHIATRIC: The patient has a normal affect.  DATA:  I reviewed her CT scan on January 2017. This reveals circumferential calcification of her aorta. She does have some calcification of her iliac  vessels but this is not extreme.   MEDICAL ISSUES: On discussion with the patient and her husband present. Explained my role for exposure. She reports that the plan is for anterior exposure for L5-S1 surgery and lateral exposure for more small lumbar surgery. Then returned to the operating room for posterior approach to the thoracic spine. Surgery is scheduled for November 10. Obviously the finding yesterday of a duodenal mass may cause a delay of this. I explained my role with mobilization of the rectus muscle, left ureter, intraperitoneal contents, and arterial and venous structures overlying the spine. Also explained the potential for injury to all of these. She understands and I do feel that she is an acceptable risk for this exposure. We will plan surgery pending results of her duodenal mass biopsy   Rosetta Posner, MD [facility] Tel [phone] Pager [phone]

## 2016-02-05 LAB — SURGICAL PATHOLOGY

## 2016-02-06 ENCOUNTER — Inpatient Hospital Stay (HOSPITAL_COMMUNITY): Admission: RE | Admit: 2016-02-06 | Payer: Medicare Other | Source: Ambulatory Visit

## 2016-02-07 ENCOUNTER — Other Ambulatory Visit: Payer: Self-pay

## 2016-02-10 ENCOUNTER — Other Ambulatory Visit: Payer: Self-pay

## 2016-02-10 ENCOUNTER — Telehealth: Payer: Self-pay | Admitting: Gastroenterology

## 2016-02-10 DIAGNOSIS — K3189 Other diseases of stomach and duodenum: Secondary | ICD-10-CM

## 2016-02-10 NOTE — Telephone Encounter (Signed)
Patient called to see if Dr.Wohl has decided what is her next step?

## 2016-02-10 NOTE — Telephone Encounter (Signed)
Pt has been advised per Dr. Allen Norris to increase her PPI to twice daily for 4 weeks to see if there is any improvement in the duodenal mass and to recheck in 4 weeks. Pt has been scheduled for EGD on 03/02/16.

## 2016-02-11 ENCOUNTER — Other Ambulatory Visit: Payer: Self-pay

## 2016-02-11 DIAGNOSIS — K3189 Other diseases of stomach and duodenum: Secondary | ICD-10-CM

## 2016-02-11 MED ORDER — ESOMEPRAZOLE MAGNESIUM 20 MG PO CPDR
20.0000 mg | DELAYED_RELEASE_CAPSULE | Freq: Two times a day (BID) | ORAL | 11 refills | Status: DC
Start: 2016-02-11 — End: 2017-04-14

## 2016-02-14 ENCOUNTER — Inpatient Hospital Stay: Admit: 2016-02-14 | Payer: Medicare Other | Admitting: Neurosurgery

## 2016-02-14 SURGERY — ANTERIOR LUMBAR FUSION 1 LEVEL
Anesthesia: General

## 2016-02-18 ENCOUNTER — Inpatient Hospital Stay: Admit: 2016-02-18 | Payer: Medicare Other | Admitting: Neurosurgery

## 2016-02-18 SURGERY — POSTERIOR LUMBAR FUSION 4 LEVEL
Anesthesia: General | Site: Back

## 2016-02-24 ENCOUNTER — Encounter: Payer: Self-pay | Admitting: *Deleted

## 2016-02-26 NOTE — Discharge Instructions (Signed)

## 2016-03-02 ENCOUNTER — Ambulatory Visit: Payer: Medicare Other | Admitting: Anesthesiology

## 2016-03-02 ENCOUNTER — Ambulatory Visit
Admission: RE | Admit: 2016-03-02 | Discharge: 2016-03-02 | Disposition: A | Discharge status: Home / Self Care | Payer: Medicare Other | Source: Ambulatory Visit | Attending: Gastroenterology | Admitting: Gastroenterology

## 2016-03-02 ENCOUNTER — Encounter
Admission: RE | Disposition: A | Discharge status: Home / Self Care | Payer: Self-pay | Source: Ambulatory Visit | Attending: Gastroenterology

## 2016-03-02 DIAGNOSIS — Z7982 Long term (current) use of aspirin: Secondary | ICD-10-CM | POA: Diagnosis not present

## 2016-03-02 DIAGNOSIS — R933 Abnormal findings on diagnostic imaging of other parts of digestive tract: Secondary | ICD-10-CM

## 2016-03-02 DIAGNOSIS — K2970 Gastritis, unspecified, without bleeding: Secondary | ICD-10-CM | POA: Diagnosis not present

## 2016-03-02 DIAGNOSIS — K219 Gastro-esophageal reflux disease without esophagitis: Secondary | ICD-10-CM | POA: Diagnosis not present

## 2016-03-02 DIAGNOSIS — Z79899 Other long term (current) drug therapy: Secondary | ICD-10-CM | POA: Insufficient documentation

## 2016-03-02 DIAGNOSIS — Z79890 Hormone replacement therapy (postmenopausal): Secondary | ICD-10-CM | POA: Insufficient documentation

## 2016-03-02 DIAGNOSIS — R1909 Other intra-abdominal and pelvic swelling, mass and lump: Secondary | ICD-10-CM | POA: Diagnosis present

## 2016-03-02 DIAGNOSIS — I472 Ventricular tachycardia: Secondary | ICD-10-CM | POA: Diagnosis not present

## 2016-03-02 DIAGNOSIS — K315 Obstruction of duodenum: Secondary | ICD-10-CM

## 2016-03-02 DIAGNOSIS — Z791 Long term (current) use of non-steroidal anti-inflammatories (NSAID): Secondary | ICD-10-CM | POA: Diagnosis not present

## 2016-03-02 DIAGNOSIS — I10 Essential (primary) hypertension: Secondary | ICD-10-CM | POA: Insufficient documentation

## 2016-03-02 DIAGNOSIS — E785 Hyperlipidemia, unspecified: Secondary | ICD-10-CM | POA: Diagnosis not present

## 2016-03-02 DIAGNOSIS — K3189 Other diseases of stomach and duodenum: Secondary | ICD-10-CM | POA: Diagnosis not present

## 2016-03-02 HISTORY — PX: ESOPHAGOGASTRODUODENOSCOPY (EGD) WITH PROPOFOL: SHX5813

## 2016-03-02 HISTORY — DX: Presence of dental prosthetic device (complete) (partial): Z97.2

## 2016-03-02 SURGERY — ESOPHAGOGASTRODUODENOSCOPY (EGD) WITH PROPOFOL
Anesthesia: Monitor Anesthesia Care | Wound class: Clean Contaminated

## 2016-03-02 MED ORDER — LIDOCAINE HCL (CARDIAC) 20 MG/ML IV SOLN
INTRAVENOUS | Status: DC | PRN
  Administered 2016-03-02: 40 mg via INTRAVENOUS

## 2016-03-02 MED ORDER — GLYCOPYRROLATE 0.2 MG/ML IJ SOLN
INTRAMUSCULAR | Status: DC | PRN
  Administered 2016-03-02: 0.2 mg via INTRAVENOUS

## 2016-03-02 MED ORDER — SODIUM CHLORIDE 0.9 % IV SOLN: INTRAVENOUS | Status: DC

## 2016-03-02 MED ORDER — PROPOFOL 10 MG/ML IV BOLUS
INTRAVENOUS | Status: DC | PRN
  Administered 2016-03-02: 30 mg via INTRAVENOUS
  Administered 2016-03-02 (×2): 10 mg via INTRAVENOUS
  Administered 2016-03-02: 20 mg via INTRAVENOUS
  Administered 2016-03-02: 50 mg via INTRAVENOUS

## 2016-03-02 MED ORDER — LACTATED RINGERS IV SOLN
INTRAVENOUS | Status: DC
  Administered 2016-03-02: 08:00:00 via INTRAVENOUS

## 2016-03-02 SURGICAL SUPPLY — 32 items
BALLN DILATOR 10-12 8 (BALLOONS)
BALLN DILATOR 12-15 8 (BALLOONS)
BALLN DILATOR 15-18 8 (BALLOONS)
BALLN DILATOR CRE 0-12 8 (BALLOONS)
BALLN DILATOR ESOPH 8 10 CRE (MISCELLANEOUS) IMPLANT
BALLOON DILATOR 12-15 8 (BALLOONS) IMPLANT
BALLOON DILATOR 15-18 8 (BALLOONS) IMPLANT
BALLOON DILATOR CRE 0-12 8 (BALLOONS) IMPLANT
BLOCK BITE 60FR ADLT L/F GRN (MISCELLANEOUS) ×3 IMPLANT
CANISTER SUCT 1200ML W/VALVE (MISCELLANEOUS) ×3 IMPLANT
CLIP HMST 235XBRD CATH ROT (MISCELLANEOUS) IMPLANT
CLIP RESOLUTION 360 11X235 (MISCELLANEOUS)
FCP ESCP3.2XJMB 240X2.8X (MISCELLANEOUS)
FORCEPS BIOP RAD 4 LRG CAP 4 (CUTTING FORCEPS) IMPLANT
FORCEPS BIOP RJ4 240 W/NDL (MISCELLANEOUS)
FORCEPS ESCP3.2XJMB 240X2.8X (MISCELLANEOUS) IMPLANT
GOWN CVR UNV OPN BCK APRN NK (MISCELLANEOUS) ×2 IMPLANT
GOWN ISOL THUMB LOOP REG UNIV (MISCELLANEOUS) ×4
INJECTOR VARIJECT VIN23 (MISCELLANEOUS) IMPLANT
KIT DEFENDO VALVE AND CONN (KITS) IMPLANT
KIT ENDO PROCEDURE OLY (KITS) ×3 IMPLANT
MARKER SPOT ENDO TATTOO 5ML (MISCELLANEOUS) IMPLANT
PAD GROUND ADULT SPLIT (MISCELLANEOUS) ×3 IMPLANT
RETRIEVER NET PLAT FOOD (MISCELLANEOUS) IMPLANT
SNARE SHORT THROW 13M SML OVAL (MISCELLANEOUS) IMPLANT
SNARE SHORT THROW 30M LRG OVAL (MISCELLANEOUS) IMPLANT
SPOT EX ENDOSCOPIC TATTOO (MISCELLANEOUS)
SYR INFLATION 60ML (SYRINGE) IMPLANT
TRAP ETRAP POLY (MISCELLANEOUS) IMPLANT
VARIJECT INJECTOR VIN23 (MISCELLANEOUS)
WATER STERILE IRR 250ML POUR (IV SOLUTION) ×3 IMPLANT
WIRE CRE 18-20MM 8CM F G (MISCELLANEOUS) IMPLANT

## 2016-03-02 NOTE — Anesthesia Postprocedure Evaluation (Signed)
Anesthesia Post Note  Patient: Allison Mclean  Procedure(s) Performed: Procedure(s) (LRB): ESOPHAGOGASTRODUODENOSCOPY (EGD) WITH PROPOFOL (N/A)  Patient location during evaluation: PACU Anesthesia Type: MAC Level of consciousness: awake and alert Pain management: pain level controlled Vital Signs Assessment: post-procedure vital signs reviewed and stable Respiratory status: spontaneous breathing, nonlabored ventilation, respiratory function stable and patient connected to nasal cannula oxygen Cardiovascular status: stable and blood pressure returned to baseline Anesthetic complications: no    Alisa Graff

## 2016-03-02 NOTE — H&P (Signed)
Lucilla Lame, MD [facility] [address], [place], [place] [address] Phone: [phone] Fax : [phone]  Primary Care Physician:  Idelle Crouch, MD Primary Gastroenterologist:  Dr. Allen Norris  Pre-Procedure History & Physical: HPI:  Allison Mclean is a 80 y.o. female is here for an endoscopy.   Past Medical History:  Diagnosis Date  . Abnormal MRI, thoracic spine 07/02/2015   IMPRESSION: 1. 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 Subsequent moderate to severe adjacent segment facet arthropathy. 2. Widespread thoracic spine disc bulging. Occasional small superimposed disc protrusions (T8-9, T11-12). 3. Subsequent multifactorial mild thoracic spinal stenosis T7-8, T8-9, T9-10, T10-11, T11-12, and T12-L1. Up to mild spi  . Acid reflux 11/22/2014  . 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 Allergy   . Chronic thoracic radicular pain (T5/T6) (Left) 07/02/2015  . Essential (primary) hypertension 12/23/2013  . GERD (gastroesophageal reflux disease)   . Heart murmur   . History of fusion of cervical spine 07/02/2015  . History of lumbar fusion (12/19/2013) (posterior lumbar fusion from L2-L5) 07/02/2015   Pedicle screws and posterior hardware at L5 L4-L3 and L2. Neil disc block at the L4-5 disc space. Slight anterior subluxation of L4 on L5. Different configuration and this had L3-4. Degenerative changes of the lumbar disc spaces. L5-S1 disc space well-maintained .   Marland Kitchen HLD (hyperlipidemia) 11/07/2013  . Hyperlipidemia   . Hypertension   . Infection    abcess in left upper tooth   . Jackhammer esophagus   . Nerve root pain 07/02/2015   Thoracic pain is likely to be secondary to severe left T5 foraminal stenosis.   . Neuropathic pain 08/05/2015  . Opiate use (10 MME/day) 07/02/2015  . Osteopenia, senile 07/02/2015  . Supraventricular tachycardia ([place]) 11/07/2013  . Thoracic central spinal stenosis (T7-8, T8-9, T9-10, T10-11, T11-12, and T12-L1) 07/02/2015  .  Thoracic facet syndrome (Left) 07/02/2015  . Thoracic foraminal stenosis (Moderate to severe at: Right T5; Bilateral T9; Left T11) 07/02/2015  . Thoracic spondylosis with radiculopathy (Left) 07/09/2015  . Wears dentures    partial lower    Past Surgical History:  Procedure Laterality Date  . ABDOMINAL HYSTERECTOMY    . APPENDECTOMY    . BACK SURGERY     2005, 2006, 2015  . CARDIAC CATHETERIZATION N/A 02/07/2015   Procedure: Left Heart Cath and Coronary Angiography;  Surgeon: Teodoro Spray, MD;  Location: [place] [facility];  Service: Cardiovascular;  Laterality: N/A;  . CHOLECYSTECTOMY    . ESOPHAGOGASTRODUODENOSCOPY (EGD) WITH PROPOFOL N/A 02/03/2016   Procedure: ESOPHAGOGASTRODUODENOSCOPY (EGD) WITH PROPOFOL;  Surgeon: Lucilla Lame, MD;  Location: [place];  Service: Endoscopy;  Laterality: N/A;  . NISSEN FUNDOPLICATION    . TONSILECTOMY, ADENOIDECTOMY, BILATERAL MYRINGOTOMY AND TUBES      Prior to Admission medications   Medication Sig Start Date End Date Taking? Authorizing Provider  aspirin EC 81 MG tablet Take 81 mg by mouth daily.  08/02/06  Yes Historical Provider, MD  calcium citrate-vitamin D (CITRACAL+D) 315-200 MG-UNIT tablet Take by mouth daily.    Yes Historical Provider, MD  clobetasol ointment (TEMOVATE) 0.05 % APPLY TWICE DAILY TO SORE AREAS IN MOUTH UNTIL HEALED. AVOID FACE AND SKIN FOLDS. 04/29/15  Yes Historical Provider, MD  esomeprazole (NEXIUM) 20 MG capsule Take 1 capsule (20 mg total) by mouth 2 (two) times daily before a meal. 02/11/16 02/10/17 Yes Brennan Karam Allen Norris, MD  estradiol (ESTRACE) 1 MG tablet Take 1 mg by mouth  daily.    Yes Historical Provider, MD  fluticasone (FLONASE) 50 MCG/ACT nasal spray USE 2 PUFFS EACH NOSTRIL AT BEDTIME AS NEEDED FOR CONGESTION 03/05/14  Yes Historical Provider, MD  meloxicam (MOBIC) 15 MG tablet Take 15 mg by mouth daily.   Yes Historical Provider, MD  metoprolol succinate (TOPROL-XL) 50 MG 24 hr tablet Take 50 mg by mouth  daily. 10/10/14  Yes Historical Provider, MD  Multiple Vitamins-Minerals (OCUVITE EXTRA PO) Take 1 tablet by mouth daily.    Yes Historical Provider, MD  nystatin (MYCOSTATIN/NYSTOP) powder Apply 1 g topically daily.  01/23/16 01/22/17 Yes Historical Provider, MD  oxyCODONE (OXY IR/ROXICODONE) 5 MG immediate release tablet Take 1 tablet (5 mg total) by mouth every 6 (six) hours as needed for severe pain. 03/06/16 04/05/16 Yes Milinda Pointer, MD  pravastatin (PRAVACHOL) 80 MG tablet Take 80 mg by mouth at bedtime. 09/15/14  Yes Historical Provider, MD  sennosides-docusate sodium (SENOKOT-S) 8.6-50 MG tablet Take 1 tablet by mouth daily as needed for constipation.   Yes Historical Provider, MD  valsartan-hydrochlorothiazide (DIOVAN-HCT) 160-12.5 MG per tablet Take 1 tablet by mouth daily. 10/29/14  Yes Historical Provider, MD    Allergies as of 02/10/2016 - Review Complete 02/04/2016  Allergen Reaction Noted  . Atorvastatin Other (See Comments) 04/24/2015  . Rosuvastatin Other (See Comments) 04/16/2014    Family History  Problem Relation Age of Onset  . Diabetes Mother   . Arthritis Mother   . Heart disease Mother   . Hypertension Mother   . Diabetes Father   . Asthma Father   . Heart disease Father   . Hypertension Father   . Stroke Father   . Breast cancer Neg Hx     Social History   Social History  . Marital status: Married    Spouse name: N/A  . Number of children: N/A  . Years of education: N/A   Occupational History  . Not on file.   Social History Main Topics  . Smoking status: Never Smoker  . Smokeless tobacco: Never Used  . Alcohol use No  . Drug use: No  . Sexual activity: Not on file   Other Topics Concern  . Not on file   Social History Narrative  . No narrative on file    Review of Systems: See HPI, otherwise negative ROS  Physical Exam: BP 137/64   Pulse 77   Temp 97.1 F (36.2 C) (Temporal)   Resp 16   Ht 5\' 1"  (1.549 m)   Wt 165 lb (74.8 kg)    SpO2 100%   BMI 31.18 kg/m  General:   Alert,  pleasant and cooperative in NAD Head:  Normocephalic and atraumatic. Neck:  Supple; no masses or thyromegaly. Lungs:  Clear throughout to auscultation.    Heart:  Regular rate and rhythm. Abdomen:  Soft, nontender and nondistended. Normal bowel sounds, without guarding, and without rebound.   Neurologic:  Alert and  oriented x4;  grossly normal neurologically.  Impression/Plan: ANITRA SEDGWICK is here for an endoscopy to be performed for duodenal mass  Risks, benefits, limitations, and alternatives regarding  endoscopy have been reviewed with the patient.  Questions have been answered.  All parties agreeable.   Lucilla Lame, MD  03/02/2016, 8:57 AM

## 2016-03-02 NOTE — Transfer of Care (Signed)
Immediate Anesthesia Transfer of Care Note  Patient: Allison Mclean  Procedure(s) Performed: Procedure(s): ESOPHAGOGASTRODUODENOSCOPY (EGD) WITH PROPOFOL (N/A)  Patient Location: PACU  Anesthesia Type: MAC  Level of Consciousness: awake, alert  and patient cooperative  Airway and Oxygen Therapy: Patient Spontanous Breathing and Patient connected to supplemental oxygen  Post-op Assessment: Post-op Vital signs reviewed, Patient's Cardiovascular Status Stable, Respiratory Function Stable, Patent Airway and No signs of Nausea or vomiting  Post-op Vital Signs: Reviewed and stable  Complications: No apparent anesthesia complications

## 2016-03-02 NOTE — Anesthesia Procedure Notes (Signed)
Procedure Name: MAC Date/Time: 03/02/2016 9:29 AM Performed by: Janna Arch Pre-anesthesia Checklist: Patient identified, Emergency Drugs available, Patient being monitored and Suction available Patient Re-evaluated:Patient Re-evaluated prior to inductionOxygen Delivery Method: Nasal cannula

## 2016-03-02 NOTE — Anesthesia Preprocedure Evaluation (Addendum)
Anesthesia Evaluation  Patient identified by MRN, date of birth, ID band Patient awake    Reviewed: Allergy & Precautions, H&P , NPO status , Patient's Chart, lab work & pertinent test results, reviewed documented beta blocker date and time   Airway Mallampati: II  TM Distance: >3 FB Neck ROM: full    Dental no notable dental hx.    Pulmonary neg pulmonary ROS,    Pulmonary exam normal breath sounds clear to auscultation       Cardiovascular Exercise Tolerance: Good hypertension, Supra Ventricular Tachycardia  Rhythm:regular Rate:Normal     Neuro/Psych negative neurological ROS  negative psych ROS   GI/Hepatic Neg liver ROS, GERD  ,  Endo/Other  negative endocrine ROS  Renal/GU negative Renal ROS  negative genitourinary   Musculoskeletal   Abdominal   Peds  Hematology negative hematology ROS (+)   Anesthesia Other Findings   Reproductive/Obstetrics negative OB ROS                             Anesthesia Physical Anesthesia Plan  ASA: II  Anesthesia Plan: MAC   Post-op Pain Management:    Induction:   Airway Management Planned:   Additional Equipment:   Intra-op Plan:   Post-operative Plan:   Informed Consent: I have reviewed the patients History and Physical, chart, labs and discussed the procedure including the risks, benefits and alternatives for the proposed anesthesia with the patient or authorized representative who has indicated his/her understanding and acceptance.   Dental Advisory Given  Plan Discussed with: CRNA  Anesthesia Plan Comments:         Anesthesia Quick Evaluation

## 2016-03-02 NOTE — Op Note (Signed)
[facility] Patient Name: Allison Mclean Procedure Date: 03/02/2016 9:23 AM MRN: [id number] Account #: [id number] Date of Birth: [birth year] Admit Type: Outpatient Age: 80 Room: [facility] OR ROOM 01 Gender: Female Note Status: Finalized Procedure:            Upper GI endoscopy Indications:          Abnormal CT of the GI tract Providers:            Lucilla Lame MD, MD Referring MD:         Leonie Douglas. Doy Hutching, MD (Referring MD) Medicines:            Propofol per Anesthesia Complications:        No immediate complications. Procedure:            Pre-Anesthesia Assessment:                       - Prior to the procedure, a History and Physical was                        performed, and patient medications and allergies were                        reviewed. The patient's tolerance of previous                        anesthesia was also reviewed. The risks and benefits of                        the procedure and the sedation options and risks were                        discussed with the patient. All questions were                        answered, and informed consent was obtained. Prior                        Anticoagulants: The patient has taken no previous                        anticoagulant or antiplatelet agents. ASA Grade                        Assessment: II - A patient with mild systemic disease.                        After reviewing the risks and benefits, the patient was                        deemed in satisfactory condition to undergo the                        procedure.                       After obtaining informed consent, the endoscope was                        passed under direct vision. Throughout the procedure,  the patient's blood pressure, pulse, and oxygen                        saturations were monitored continuously. The was                        introduced through the mouth, and advanced to the                         second part of duodenum. The upper GI endoscopy was                        accomplished without difficulty. The patient tolerated                        the procedure well. Findings:      The examined esophagus was normal.      Evidence of a Nissen fundoplication was found in the gastric fundus.      Localized mild inflammation characterized by erythema was found in the       gastric antrum.      An acquired moderate stenosis was found in the first portion of the       duodenum and was traversed. Biopsies were taken with a cold forceps for       histology. Impression:           - Normal esophagus.                       - A Nissen fundoplication was found.                       - Gastritis.                       - Acquired duodenal stenosis. Biopsied.                       - Although the stenosis is still present it is improved. Recommendation:       - Await pathology results.                       - Discharge patient to home.                       - Resume previous diet.                       - If benign tissue again then recommend an EUS. Procedure Code(s):    --- Professional ---                       [phone], Esophagogastroduodenoscopy, flexible, transoral;                        with biopsy, single or multiple Diagnosis Code(s):    --- Professional ---                       R93.3, Abnormal findings on diagnostic imaging of other                        parts of digestive tract  Z98.890, Other specified postprocedural states                       K31.5, Obstruction of duodenum CPT copyright 2016 American Medical Association. All rights reserved. The codes documented in this report are preliminary and upon coder review may  be revised to meet current compliance requirements. Lucilla Lame MD, MD 03/02/2016 9:42:02 AM This report has been signed electronically. Number of Addenda: 0 Note Initiated On: 03/02/2016 9:23 AM      [facility]

## 2016-03-03 ENCOUNTER — Encounter: Payer: Self-pay | Admitting: Gastroenterology

## 2016-03-05 ENCOUNTER — Encounter: Payer: Self-pay | Admitting: Gastroenterology

## 2016-03-09 ENCOUNTER — Telehealth: Payer: Self-pay | Admitting: Gastroenterology

## 2016-03-09 ENCOUNTER — Telehealth: Payer: Self-pay

## 2016-03-09 NOTE — Telephone Encounter (Signed)
  Oncology Nurse Navigator Documentation Received referral from Dr. Allen Norris for EUS for duodenal mass. Spoke with Ms. Man on the phone. She is in agreeance for procedure to be scheduled at [facility] due to delay in scheduling at [facility]. Information sent to [facility] scheduling and they will contact her regarding date/time/instructions. If [facility] has a cancellation I will contact her regarding.  Navigator Location: [facility] (03/09/16 1600)   )                              Interventions: Coordination of Care (03/09/16 1600)   Coordination of Care: EUS (03/09/16 1600)                  Time Spent with Patient: 30 (03/09/16 1600)

## 2016-03-09 NOTE — Telephone Encounter (Signed)
Pt notified of EGD results. Message has been sent to Mariea Clonts at the cancer center to schedule EUS.

## 2016-03-09 NOTE — Telephone Encounter (Signed)
-----   Message from Lucilla Lame, MD sent at 03/09/2016 10:46 AM EST ----- Let the patient know that the biopsies came back as benign inflammation. She should be set up for an EUS of this mass.

## 2016-03-09 NOTE — Telephone Encounter (Signed)
results

## 2016-03-16 NOTE — Progress Notes (Signed)
  Oncology Nurse Navigator Documentation EUS report faxed to Dr. Allen Norris. Pathology pending. Navigator Location: [facility] (03/16/16 0900)   )Navigator Encounter Type: Letter/Fax/Email (03/16/16 0900)                                 Coordination of Care: EUS (03/16/16 0900)                  Time Spent with Patient: 15 (03/16/16 0900)

## 2016-03-19 ENCOUNTER — Telehealth: Payer: Self-pay | Admitting: Gastroenterology

## 2016-03-19 NOTE — Telephone Encounter (Signed)
Could you please refer this pt to a GI surgical specialist at [facility] for a duodenal mass. Please send recent EGD and EUS report. Make sure they are aware we have EPIC.   Thanks Angie.

## 2016-03-19 NOTE — Telephone Encounter (Signed)
Please review EUS results and advise.

## 2016-03-19 NOTE — Telephone Encounter (Signed)
Let the patient know that the biopsies of the EUS showed it to be benign. If the lesion is causing her any symptoms then she should consider having removed otherwise no further intervention is needed.

## 2016-03-19 NOTE — Progress Notes (Signed)
EUS pathology faxed to Dr. Allen Norris in [place] office.  Oncology Nurse Navigator Documentation  Navigator Location: [facility] (03/19/16 0900)   )Navigator Encounter Type: Letter/Fax/Email (03/19/16 0900)                                 Coordination of Care: EUS (03/19/16 0900)                  Time Spent with Patient: 15 (03/19/16 0900)

## 2016-03-19 NOTE — Telephone Encounter (Signed)
results

## 2016-03-20 NOTE — Telephone Encounter (Signed)
I have faxed a referral to [facility] per Dr Dorothey Baseman request.  All clinic notes, pathology, labs, and procedures have been faxed to [phone].  [facility] will contact the patient after all notes have been reviewed and make the patient an appointment within 5-7 business days.   I will follow up to make sure appointment is made.

## 2016-03-20 NOTE — Telephone Encounter (Signed)
Thank you :)

## 2016-04-06 HISTORY — PX: SPINAL GROWTH RODS: SHX2427

## 2016-04-07 ENCOUNTER — Other Ambulatory Visit: Payer: Self-pay | Admitting: Internal Medicine

## 2016-04-07 DIAGNOSIS — Z1231 Encounter for screening mammogram for malignant neoplasm of breast: Secondary | ICD-10-CM

## 2016-04-09 ENCOUNTER — Ambulatory Visit
Admission: RE | Admit: 2016-04-09 | Discharge: 2016-04-09 | Disposition: A | Discharge status: Home / Self Care | Payer: Medicare Other | Source: Ambulatory Visit | Attending: Internal Medicine | Admitting: Internal Medicine

## 2016-04-09 ENCOUNTER — Ambulatory Visit: Payer: Medicare Other | Admitting: Pain Medicine

## 2016-04-09 DIAGNOSIS — Z1231 Encounter for screening mammogram for malignant neoplasm of breast: Secondary | ICD-10-CM | POA: Diagnosis not present

## 2016-04-14 ENCOUNTER — Other Ambulatory Visit: Payer: Self-pay | Admitting: Neurosurgery

## 2016-04-29 ENCOUNTER — Telehealth: Payer: Self-pay | Admitting: Vascular Surgery

## 2016-04-29 NOTE — Telephone Encounter (Signed)
-----   Message from Denman George, RN sent at 04/29/2016 11:40 AM EST ----- Regarding: needs appt. with Dr. Scot Dock Please schedule appt. with Dr. Scot Dock prior to ALIF 06/02/16.  Remind pt. To bring copy of L-S spine films to appt.

## 2016-04-29 NOTE — Telephone Encounter (Signed)
Spoke to pt confirmed she still has films and will bring them with her. Original ALIF can. Had consult with [facility] for that on 02/04/16  appt 2/14 mailing letter as well

## 2016-05-13 ENCOUNTER — Other Ambulatory Visit: Payer: Self-pay

## 2016-05-14 ENCOUNTER — Encounter: Payer: Self-pay | Admitting: Vascular Surgery

## 2016-05-20 ENCOUNTER — Encounter: Payer: Self-pay | Admitting: Vascular Surgery

## 2016-05-20 ENCOUNTER — Ambulatory Visit (INDEPENDENT_AMBULATORY_CARE_PROVIDER_SITE_OTHER): Payer: Medicare Other | Admitting: Vascular Surgery

## 2016-05-20 VITALS — BP 140/65 | HR 67 | Temp 96.6°F | Resp 18 | Ht 61.0 in | Wt 167.4 lb

## 2016-05-20 DIAGNOSIS — M51379 Other intervertebral disc degeneration, lumbosacral region without mention of lumbar back pain or lower extremity pain: Secondary | ICD-10-CM

## 2016-05-20 DIAGNOSIS — M5136 Other intervertebral disc degeneration, lumbar region: Secondary | ICD-10-CM

## 2016-05-20 DIAGNOSIS — M5137 Other intervertebral disc degeneration, lumbosacral region: Secondary | ICD-10-CM

## 2016-05-20 NOTE — Progress Notes (Signed)
Patient name: Allison Mclean MRN: [id number] DOB: Jan 27, 1936 Sex: female  REASON FOR CONSULT: Evaluate for anterior retroperitoneal exposure of L5-S1. Referred by Dr. Vertell Limber.  HPI: Allison Mclean is a 81 y.o. female, with a long history of spine issues. She's had multiple previous operations on her back. Her pain is aggravated by any activity and relieved somewhat with rest. Her symptoms have gradually progressed.  I have reviewed the records that were sent with the patient. She has been in tremendous pain and is felt to be a good candidate for anterior lumbar interbody fusion at L5-S1. She has tried all conservative measures unsuccessfully. We have been asked to provide anterior rector perineal exposure of L5-S1.  Past Medical History:  Diagnosis Date  . Abnormal MRI, thoracic spine 07/02/2015   IMPRESSION: 1. 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 Subsequent moderate to severe adjacent segment facet arthropathy. 2. Widespread thoracic spine disc bulging. Occasional small superimposed disc protrusions (T8-9, T11-12). 3. Subsequent multifactorial mild thoracic spinal stenosis T7-8, T8-9, T9-10, T10-11, T11-12, and T12-L1. Up to mild spi  . Acid reflux 11/22/2014  . 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 Allergy   . Chronic thoracic radicular pain (T5/T6) (Left) 07/02/2015  . Essential (primary) hypertension 12/23/2013  . GERD (gastroesophageal reflux disease)   . Heart murmur   . History of fusion of cervical spine 07/02/2015  . History of lumbar fusion (12/19/2013) (posterior lumbar fusion from L2-L5) 07/02/2015   Pedicle screws and posterior hardware at L5 L4-L3 and L2. Neil disc block at the L4-5 disc space. Slight anterior subluxation of L4 on L5. Different configuration and this had L3-4. Degenerative changes of the lumbar disc spaces. L5-S1 disc space well-maintained .   Marland Kitchen HLD (hyperlipidemia) 11/07/2013  . Hyperlipidemia   . Hypertension   .  Infection    abcess in left upper tooth   . Jackhammer esophagus   . Nerve root pain 07/02/2015   Thoracic pain is likely to be secondary to severe left T5 foraminal stenosis.   . Neuropathic pain 08/05/2015  . Opiate use (10 MME/day) 07/02/2015  . Osteopenia, senile 07/02/2015  . Supraventricular tachycardia ([place]) 11/07/2013  . Thoracic central spinal stenosis (T7-8, T8-9, T9-10, T10-11, T11-12, and T12-L1) 07/02/2015  . Thoracic facet syndrome (Left) 07/02/2015  . Thoracic foraminal stenosis (Moderate to severe at: Right T5; Bilateral T9; Left T11) 07/02/2015  . Thoracic spondylosis with radiculopathy (Left) 07/09/2015  . Wears dentures    partial lower    Family History  Problem Relation Age of Onset  . Diabetes Mother   . Arthritis Mother   . Heart disease Mother   . Hypertension Mother   . Diabetes Father   . Asthma Father   . Heart disease Father   . Hypertension Father   . Stroke Father   . Breast cancer Neg Hx     SOCIAL HISTORY: Social History   Social History  . Marital status: Married    Spouse name: N/A  . Number of children: N/A  . Years of education: N/A   Occupational History  . Not on file.   Social History Main Topics  . Smoking status: Never Smoker  . Smokeless tobacco: Never Used  . Alcohol use No  . Drug use: No  . Sexual activity: Not on file   Other Topics Concern  . Not on file   Social History Narrative  . No narrative on file  Allergies  Allergen Reactions  . Atorvastatin Other (See Comments)    Chest pain    . Rosuvastatin Other (See Comments)    Causes chest pain      Current Outpatient Prescriptions  Medication Sig Dispense Refill  . aspirin EC 81 MG tablet Take 81 mg by mouth daily.     . calcium citrate-vitamin D (CITRACAL+D) 315-200 MG-UNIT tablet Take by mouth daily.     . clobetasol ointment (TEMOVATE) 0.05 % APPLY TWICE DAILY TO SORE AREAS IN MOUTH UNTIL HEALED. AVOID FACE AND SKIN FOLDS.  1  . esomeprazole (NEXIUM) 20 MG  capsule Take 1 capsule (20 mg total) by mouth 2 (two) times daily before a meal. 60 capsule 11  . estradiol (ESTRACE) 1 MG tablet Take 1 mg by mouth daily.     . fluticasone (FLONASE) 50 MCG/ACT nasal spray USE 2 PUFFS EACH NOSTRIL AT BEDTIME AS NEEDED FOR CONGESTION    . meloxicam (MOBIC) 15 MG tablet Take 15 mg by mouth daily.    . metoprolol succinate (TOPROL-XL) 50 MG 24 hr tablet Take 50 mg by mouth daily.  3  . Multiple Vitamins-Minerals (OCUVITE EXTRA PO) Take 1 tablet by mouth daily.     Marland Kitchen nystatin (MYCOSTATIN/NYSTOP) powder Apply 1 g topically daily.     . pravastatin (PRAVACHOL) 80 MG tablet Take 80 mg by mouth at bedtime.  3  . sennosides-docusate sodium (SENOKOT-S) 8.6-50 MG tablet Take 1 tablet by mouth daily as needed for constipation.    . valsartan-hydrochlorothiazide (DIOVAN-HCT) 160-12.5 MG per tablet Take 1 tablet by mouth daily.  3  . oxyCODONE (OXY IR/ROXICODONE) 5 MG immediate release tablet Take 1 tablet (5 mg total) by mouth every 6 (six) hours as needed for severe pain. 120 tablet 0   No current facility-administered medications for this visit.     REVIEW OF SYSTEMS:  [X]  denotes positive finding, [ ]  denotes negative finding Cardiac  Comments:  Chest pain or chest pressure:    Shortness of breath upon exertion:    Short of breath when lying flat:    Irregular heart rhythm:        Vascular    Pain in calf, thigh, or hip brought on by ambulation:    Pain in feet at night that wakes you up from your sleep:     Blood clot in your veins:    Leg swelling:         Pulmonary    Oxygen at home:    Productive cough:     Wheezing:         Neurologic    Sudden weakness in arms or legs:     Sudden numbness in arms or legs:     Sudden onset of difficulty speaking or slurred speech:    Temporary loss of vision in one eye:     Problems with dizziness:         Gastrointestinal    Blood in stool:     Vomited blood:         Genitourinary    Burning when urinating:      Blood in urine:        Psychiatric    Major depression:         Hematologic    Bleeding problems:    Problems with blood clotting too easily:        Skin    Rashes or ulcers:        Constitutional  Fever or chills:      PHYSICAL EXAM: Vitals:   05/20/16 1253  BP: 140/65  Pulse: 67  Resp: 18  Temp: (!) 96.6 F (35.9 C)  TempSrc: Oral  SpO2: 100%  Weight: 167 lb 6.4 oz (75.9 kg)  Height: 5\' 1"  (1.549 m)    GENERAL: The patient is a well-nourished female, in no acute distress. The vital signs are documented above. CARDIAC: There is a regular rate and rhythm.  VASCULAR: I do not detect carotid bruits. She has palpable femoral pulses. She has a palpable left dorsalis pedis pulse. I cannot palpate a right dorsalis pedis pulse. Both feet are warm and well perfused. She has mild bilateral lower extremity swelling. PULMONARY: There is good air exchange bilaterally without wheezing or rales. ABDOMEN: Soft and non-tender with normal pitched bowel sounds.  MUSCULOSKELETAL: There are no major deformities or cyanosis. NEUROLOGIC: No focal weakness or paresthesias are detected. SKIN: There are no ulcers or rashes noted. PSYCHIATRIC: The patient has a normal affect.  DATA:   CT SCAN ABDOMEN PELVIS: I have reviewed the CT scan from January 2017. This does show some calcific disease in the aorta but minimal calcium in the left common iliac artery. I think we would be below this level IV and L5-S1 exposure.  MEDICAL ISSUES:  DEGENERATIVE DISC DISEASE: I have been asked to provide anterior rectoperineal exposure of L5-S1. She appears to be a reasonable candidate for this. She has had previous abdominal operations including hysterectomy, appendectomy, and cholecystectomy. She's also had a sympathectomy in for January. Thus we could potentially find some scar tissue to make the dissection more difficult. She does have some calcium in her infrarenal aorta and some in the left common  iliac artery that I think will be below that level.  I have reviewed our role in exposure of the spine in order to allow anterior lumbar interbody fusion at the appropriate levels. We have discussed the potential complications of surgery, including but not limited to, arterial or venous injury, thrombosis, or bleeding. We have also discussed the potential risks of wound healing problems, the development of a hernia, nerve injury, leg swelling, or other unpredictable medical problems.  All the patient's questions were answered and they are agreeable to proceed.  Deitra Mayo [facility] [phone]

## 2016-05-26 ENCOUNTER — Encounter (HOSPITAL_COMMUNITY)
Admission: RE | Admit: 2016-05-26 | Discharge: 2016-05-26 | Disposition: A | Discharge status: Home / Self Care | Payer: Medicare Other | Source: Ambulatory Visit | Attending: Neurosurgery | Admitting: Neurosurgery

## 2016-05-26 ENCOUNTER — Encounter (HOSPITAL_COMMUNITY): Payer: Self-pay

## 2016-05-26 DIAGNOSIS — M419 Scoliosis, unspecified: Secondary | ICD-10-CM | POA: Insufficient documentation

## 2016-05-26 DIAGNOSIS — Z01818 Encounter for other preprocedural examination: Secondary | ICD-10-CM | POA: Diagnosis present

## 2016-05-26 HISTORY — DX: Personal history of urinary calculi: Z87.442

## 2016-05-26 HISTORY — DX: Personal history of other diseases of the digestive system: Z87.19

## 2016-05-26 LAB — BASIC METABOLIC PANEL
Anion gap: 10 (ref 5–15)
BUN: 7 mg/dL (ref 6–20)
CHLORIDE: 96 mmol/L — AB (ref 101–111)
CO2: 28 mmol/L (ref 22–32)
CREATININE: 0.78 mg/dL (ref 0.44–1.00)
Calcium: 10.1 mg/dL (ref 8.9–10.3)
GFR calc Af Amer: >60 mL/min (ref >60)
GFR calc non Af Amer: >60 mL/min (ref >60)
GLUCOSE: 84 mg/dL (ref 65–99)
Potassium: 4 mmol/L (ref 3.5–5.1)
Sodium: 134 mmol/L — ABNORMAL LOW (ref 135–145)

## 2016-05-26 LAB — TYPE AND SCREEN
ABO/RH(D): O POS
Antibody Screen: NEGATIVE

## 2016-05-26 LAB — CBC
HEMATOCRIT: 35.7 % — AB (ref 36.0–46.0)
HEMOGLOBIN: 10.7 g/dL — AB (ref 12.0–15.0)
MCH: 22.6 pg — AB (ref 26.0–34.0)
MCHC: 30 g/dL (ref 30.0–36.0)
MCV: 75.5 fL — ABNORMAL LOW (ref 78.0–100.0)
Platelets: 252 10*3/uL (ref 150–400)
RBC: 4.73 MIL/uL (ref 3.87–5.11)
RDW: 17.3 % — ABNORMAL HIGH (ref 11.5–15.5)
WBC: 7.8 10*3/uL (ref 4.0–10.5)

## 2016-05-26 LAB — ABO/RH: ABO/RH(D): O POS

## 2016-05-26 LAB — SURGICAL PCR SCREEN
MRSA, PCR: NEGATIVE
Staphylococcus aureus: NEGATIVE

## 2016-05-26 MED ORDER — CHLORHEXIDINE GLUCONATE 4 % EX LIQD: 60.0000 mL | Freq: Once | CUTANEOUS | Status: DC

## 2016-05-26 NOTE — Pre-Procedure Instructions (Addendum)
Allison Mclean  05/26/2016      [id number] Allison Mclean, [place] [address] Phone: [phone] Fax: [phone]    Your procedure is scheduled on 06/02/16.  Report to [facility] Admitting at 530 A.M.  Call this number if you have problems the morning of surgery:  [phone]   Remember:  Do not eat food or drink liquids after midnight.  Take these medicines the morning of surgery with A SIP OF WATER    Nexium, estradiol, metoprolol, oxycodone if needed  STOP all herbel meds, nsaids (aleve,naproxen,advil,ibuprofen) 7 days prior to surgery starting today including all vitamins/supplements,meloxicam,aspirin   Do not wear jewelry, make-up or nail polish.  Do not wear lotions, powders, or perfumes, or deoderant.  Do not shave 48 hours prior to surgery.  Men may shave face and neck.  Do not bring valuables to the hospital.  [facility] is not responsible for any belongings or valuables.  Contacts, dentures or bridgework may not be worn into surgery.  Leave your suitcase in the car.  After surgery it may be brought to your room.  For patients admitted to the hospital, discharge time will be determined by your treatment team.  Patients discharged the day of surgery will not be allowed to drive home.   Special instructions:  * Special Instructions: [facility] - Preparing for Surgery  Before surgery, you can play an important role.  Because skin is not sterile, your skin needs to be as free of germs as possible.  You can reduce the number of germs on you skin by washing with CHG (chlorahexidine gluconate) soap before surgery.  CHG is an antiseptic cleaner which kills germs and bonds with the skin to continue killing germs even after washing.  Please DO NOT use if you have an allergy to CHG or antibacterial soaps.  If your skin becomes reddened/irritated stop using the CHG and inform your nurse when you arrive at Short  Stay.  Do not shave (including legs and underarms) for at least 48 hours prior to the first CHG shower.  You may shave your face.  Please follow these instructions carefully:   1.  Shower with CHG Soap the night before surgery and the morning of Surgery.  2.  If you choose to wash your hair, wash your hair first as usual with your normal shampoo.  3.  After you shampoo, rinse your hair and body thoroughly to remove the Shampoo.  4.  Use CHG as you would any other liquid soap.  You can apply chg directly  to the skin and wash gently with scrungie or a clean washcloth.  5.  Apply the CHG Soap to your body ONLY FROM THE NECK DOWN.  Do not use on open wounds or open sores.  Avoid contact with your eyes ears, mouth and genitals (private parts).  Wash genitals (private parts)       with your normal soap.  6.  Wash thoroughly, paying special attention to the area where your surgery will be performed.  7.  Thoroughly rinse your body with warm water from the neck down.  8.  DO NOT shower/wash with your normal soap after using and rinsing off the CHG Soap.  9.  Pat yourself dry with a clean towel.            10.  Wear clean pajamas.  11.  Place clean sheets on your bed the night of your first shower and do not sleep with pets.  Day of Surgery  Do not apply any lotions/deodorants the morning of surgery.  Please wear clean clothes to the hospital/surgery center.  Please read over the  fact sheets that you were given.

## 2016-05-28 NOTE — Progress Notes (Signed)
Anesthesia chart review: Patient is a 81 year old female scheduled for stage I L5-S1 anterior lumbar interbody fusion, L1-2 anterior lateral lumbar fusion on 06/02/2016 by Dr. Vertell Limber. Dr. Deitra Mayo will be doing anterior exposure. She is scheduled for stage II T6-pelvis fixation on 06/05/16.   History include never smoker, HTN, GERD, SVT '15, colitis, HLD, jackhammer esophagus, reflux, hiatal hernia, murmur, lumbar fusion, cervical fusion. BMI is consistent with obesity. Patient recently underwent GI work-up for possible duodenal mass. Per 04/03/16 office note, work-up per Dr. 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 No general surgery recommended.   - PCP is Dr. Fulton Reek, last visit 05/06/16. - Cardiologist is Dr. Bartholome Bill, last visit 02/11/16 Jefm Bryant; [facility]; see Care Everywhere). He saw her for follow-up SVT ("no significant arrhythmia" or Holter; on b-blocker) and chest pain (cardiac cath showed no significant disease). He wrote, "Patient is a low risk from a cardiac standpoint for any surgical procedures that are necessary." - GI is Dr. Lucilla Lame.  Meds include aspirin 81 mg, Zyrtec, Nexium, estradiol, Flonase, Toprol-XL, OxyIR, pravastatin, Diovan-HCT.  BP (!) 120/54   Pulse 67   Temp 36.7 C   Resp 18   Ht 5\' 1"  (1.549 m)   Wt 171 lb 1.6 oz (77.6 kg)   SpO2 100%   BMI 32.33 kg/m    EKG 08/27/15 [facility]): Normal sinus rhythm, incomplete right bundle branch block, left anterior fascicular block, LVH with repolarization abnormality, cannot rule out septal infarct, age undetermined.  48 Hour Holter 09/20/15 ([facility]; see Care Everywhere): Summary: 1.Sinus rhythm with frequent PVCs.Several what appear to be ventricular runs versus apparent SVT.Longest was 16 beats.Multiform PVCs couplets and triplets.No prolonged pauses.  Echo 09/16/15  ([facility]; see Care Everywhere): INTERPRETATION WITH MILD LVH NORMAL RIGHT VENTRICULAR SYSTOLIC FUNCTION MILD VALVULAR REGURGITATION (mild AR, mild MR, mild TR, mild PR) NO VALVULAR STENOSIS Morphology: MILDLY THICKENED Contraction: MILD GLOBAL DECREASE LVH: MILD LVH Size: MILDLY ENLARGED Aortic: MILD AR Mitral: MILD MR Tricuspid: MILD TR Closest EF: 40% (Estimated)  Cardiac cath 02/07/15: Final Conclusions:  Normal coronary arteries. Mild-moderate reduced lv function, gobally  Recommendations: Medical therapy   Preoperative labs noted. Cr 0.78. Glucose 84. H/H 10.7/35.7 (results are consistent with 04/29/16 and 01/2016 results from [place]). PLT 252. T&S done.   If no acute changes then I anticipate that she can proceed as planned.  [facility] Short Stay Center/Anesthesiology Phone [phone] 05/28/2016 11:29 AM

## 2016-06-01 MED ORDER — CEFAZOLIN SODIUM-DEXTROSE 2-4 GM/100ML-% IV SOLN
2.0000 g | INTRAVENOUS | Status: AC
  Administered 2016-06-02: 2 g via INTRAVENOUS
  Filled 2016-06-01: qty 100

## 2016-06-01 NOTE — Anesthesia Preprocedure Evaluation (Addendum)
Anesthesia Evaluation  Patient identified by MRN, date of birth, ID band Patient awake    Reviewed: Allergy & Precautions, H&P , NPO status , Patient's Chart, lab work & pertinent test results  Airway Mallampati: II  TM Distance: >3 FB Neck ROM: Full    Dental no notable dental hx. (+) Teeth Intact, Dental Advisory Given   Pulmonary neg pulmonary ROS,    Pulmonary exam normal breath sounds clear to auscultation       Cardiovascular Exercise Tolerance: Good hypertension, Pt. on medications and Pt. on home beta blockers + dysrhythmias Supra Ventricular Tachycardia  Rhythm:Regular Rate:Normal     Neuro/Psych negative neurological ROS  negative psych ROS   GI/Hepatic Neg liver ROS, hiatal hernia, GERD  Medicated and Controlled,  Endo/Other  negative endocrine ROS  Renal/GU negative Renal ROS  negative genitourinary   Musculoskeletal  (+) Arthritis , Osteoarthritis,    Abdominal   Peds  Hematology negative hematology ROS (+)   Anesthesia Other Findings   Reproductive/Obstetrics negative OB ROS                            Anesthesia Physical Anesthesia Plan  ASA: III  Anesthesia Plan: General   Post-op Pain Management:    Induction: Intravenous  Airway Management Planned: Oral ETT  Additional Equipment: Arterial line  Intra-op Plan:   Post-operative Plan: Extubation in OR and Possible Post-op intubation/ventilation  Informed Consent: I have reviewed the patients History and Physical, chart, labs and discussed the procedure including the risks, benefits and alternatives for the proposed anesthesia with the patient or authorized representative who has indicated his/her understanding and acceptance.   Dental advisory given  Plan Discussed with: CRNA  Anesthesia Plan Comments:         Anesthesia Quick Evaluation

## 2016-06-02 ENCOUNTER — Inpatient Hospital Stay (HOSPITAL_COMMUNITY)
Admission: RE | Admit: 2016-06-02 | Discharge: 2016-06-09 | DRG: 455 | Disposition: A | Discharge status: Skilled Nursing Facility | Payer: Medicare Other | Source: Ambulatory Visit | Attending: Neurosurgery | Admitting: Neurosurgery

## 2016-06-02 ENCOUNTER — Inpatient Hospital Stay (HOSPITAL_COMMUNITY): Payer: Medicare Other

## 2016-06-02 ENCOUNTER — Encounter (HOSPITAL_COMMUNITY): Payer: Self-pay | Admitting: Certified Registered Nurse Anesthetist

## 2016-06-02 ENCOUNTER — Inpatient Hospital Stay (HOSPITAL_COMMUNITY): Payer: Medicare Other | Admitting: Certified Registered Nurse Anesthetist

## 2016-06-02 ENCOUNTER — Inpatient Hospital Stay (HOSPITAL_COMMUNITY): Payer: Medicare Other | Admitting: Vascular Surgery

## 2016-06-02 ENCOUNTER — Encounter (HOSPITAL_COMMUNITY)
Admission: RE | Disposition: A | Discharge status: Home / Self Care | Payer: Self-pay | Source: Ambulatory Visit | Attending: Neurosurgery

## 2016-06-02 DIAGNOSIS — M48061 Spinal stenosis, lumbar region without neurogenic claudication: Secondary | ICD-10-CM | POA: Diagnosis present

## 2016-06-02 DIAGNOSIS — Z7982 Long term (current) use of aspirin: Secondary | ICD-10-CM | POA: Diagnosis not present

## 2016-06-02 DIAGNOSIS — M5414 Radiculopathy, thoracic region: Secondary | ICD-10-CM | POA: Diagnosis present

## 2016-06-02 DIAGNOSIS — Z8249 Family history of ischemic heart disease and other diseases of the circulatory system: Secondary | ICD-10-CM

## 2016-06-02 DIAGNOSIS — M5125 Other intervertebral disc displacement, thoracolumbar region: Secondary | ICD-10-CM | POA: Diagnosis present

## 2016-06-02 DIAGNOSIS — M419 Scoliosis, unspecified: Secondary | ICD-10-CM | POA: Diagnosis present

## 2016-06-02 DIAGNOSIS — I10 Essential (primary) hypertension: Secondary | ICD-10-CM | POA: Diagnosis present

## 2016-06-02 DIAGNOSIS — Z833 Family history of diabetes mellitus: Secondary | ICD-10-CM | POA: Diagnosis not present

## 2016-06-02 DIAGNOSIS — Z7951 Long term (current) use of inhaled steroids: Secondary | ICD-10-CM | POA: Diagnosis not present

## 2016-06-02 DIAGNOSIS — Z825 Family history of asthma and other chronic lower respiratory diseases: Secondary | ICD-10-CM | POA: Diagnosis not present

## 2016-06-02 DIAGNOSIS — Z823 Family history of stroke: Secondary | ICD-10-CM | POA: Diagnosis not present

## 2016-06-02 DIAGNOSIS — K219 Gastro-esophageal reflux disease without esophagitis: Secondary | ICD-10-CM | POA: Diagnosis present

## 2016-06-02 DIAGNOSIS — Z8261 Family history of arthritis: Secondary | ICD-10-CM

## 2016-06-02 DIAGNOSIS — E7800 Pure hypercholesterolemia, unspecified: Secondary | ICD-10-CM | POA: Diagnosis present

## 2016-06-02 DIAGNOSIS — Z888 Allergy status to other drugs, medicaments and biological substances status: Secondary | ICD-10-CM

## 2016-06-02 DIAGNOSIS — Z79899 Other long term (current) drug therapy: Secondary | ICD-10-CM

## 2016-06-02 DIAGNOSIS — Z90710 Acquired absence of both cervix and uterus: Secondary | ICD-10-CM | POA: Diagnosis not present

## 2016-06-02 DIAGNOSIS — M549 Dorsalgia, unspecified: Secondary | ICD-10-CM | POA: Diagnosis present

## 2016-06-02 DIAGNOSIS — Z9049 Acquired absence of other specified parts of digestive tract: Secondary | ICD-10-CM

## 2016-06-02 DIAGNOSIS — Z791 Long term (current) use of non-steroidal anti-inflammatories (NSAID): Secondary | ICD-10-CM

## 2016-06-02 DIAGNOSIS — M5137 Other intervertebral disc degeneration, lumbosacral region: Secondary | ICD-10-CM | POA: Diagnosis not present

## 2016-06-02 DIAGNOSIS — M4804 Spinal stenosis, thoracic region: Secondary | ICD-10-CM | POA: Diagnosis present

## 2016-06-02 DIAGNOSIS — E785 Hyperlipidemia, unspecified: Secondary | ICD-10-CM | POA: Diagnosis present

## 2016-06-02 DIAGNOSIS — M4120 Other idiopathic scoliosis, site unspecified: Secondary | ICD-10-CM

## 2016-06-02 DIAGNOSIS — Z981 Arthrodesis status: Secondary | ICD-10-CM

## 2016-06-02 DIAGNOSIS — M4690 Unspecified inflammatory spondylopathy, site unspecified: Secondary | ICD-10-CM | POA: Diagnosis present

## 2016-06-02 DIAGNOSIS — Z419 Encounter for procedure for purposes other than remedying health state, unspecified: Secondary | ICD-10-CM

## 2016-06-02 DIAGNOSIS — Z452 Encounter for adjustment and management of vascular access device: Secondary | ICD-10-CM

## 2016-06-02 HISTORY — PX: ABDOMINAL EXPOSURE: SHX5708

## 2016-06-02 HISTORY — PX: ANTERIOR LAT LUMBAR FUSION: SHX1168

## 2016-06-02 HISTORY — PX: ANTERIOR LUMBAR FUSION: SHX1170

## 2016-06-02 SURGERY — ANTERIOR LUMBAR FUSION 1 LEVEL
Anesthesia: General

## 2016-06-02 MED ORDER — THROMBIN 5000 UNITS EX SOLR
CUTANEOUS | Status: AC
  Filled 2016-06-02: qty 15000

## 2016-06-02 MED ORDER — ACETAMINOPHEN 325 MG PO TABS: 650.0000 mg | ORAL_TABLET | Freq: Four times a day (QID) | ORAL | Status: DC | PRN

## 2016-06-02 MED ORDER — BISACODYL 10 MG RE SUPP
10.0000 mg | Freq: Every day | RECTAL | Status: DC | PRN
  Administered 2016-06-03 – 2016-06-09 (×2): 10 mg via RECTAL
  Filled 2016-06-02 (×2): qty 1

## 2016-06-02 MED ORDER — SENNOSIDES-DOCUSATE SODIUM 8.6-50 MG PO TABS: 1.0000 | ORAL_TABLET | Freq: Every day | ORAL | Status: DC | PRN

## 2016-06-02 MED ORDER — HEMOSTATIC AGENTS (NO CHARGE) OPTIME
TOPICAL | Status: DC | PRN
  Administered 2016-06-02: 1 via TOPICAL

## 2016-06-02 MED ORDER — MENTHOL 3 MG MT LOZG: 1.0000 | LOZENGE | OROMUCOSAL | Status: DC | PRN

## 2016-06-02 MED ORDER — ROCURONIUM BROMIDE 100 MG/10ML IV SOLN
INTRAVENOUS | Status: DC | PRN
Start: 2016-06-02 — End: 2016-06-02
  Administered 2016-06-02: 20 mg via INTRAVENOUS
  Administered 2016-06-02: 50 mg via INTRAVENOUS

## 2016-06-02 MED ORDER — METHOCARBAMOL 500 MG PO TABS
ORAL_TABLET | ORAL | Status: AC
  Filled 2016-06-02: qty 1

## 2016-06-02 MED ORDER — ESTRADIOL 1 MG PO TABS
1.0000 mg | ORAL_TABLET | Freq: Every day | ORAL | Status: DC
  Administered 2016-06-03 – 2016-06-09 (×6): 1 mg via ORAL
  Filled 2016-06-02 (×7): qty 1

## 2016-06-02 MED ORDER — OXYCODONE HCL 5 MG PO TABS
5.0000 mg | ORAL_TABLET | ORAL | Status: DC | PRN
  Administered 2016-06-02: 5 mg via ORAL
  Administered 2016-06-03 (×2): 10 mg via ORAL
  Administered 2016-06-06: 5 mg via ORAL
  Administered 2016-06-06 – 2016-06-07 (×4): 10 mg via ORAL
  Administered 2016-06-08 (×2): 5 mg via ORAL
  Administered 2016-06-08 – 2016-06-09 (×4): 10 mg via ORAL
  Filled 2016-06-02 (×6): qty 2
  Filled 2016-06-02: qty 1
  Filled 2016-06-02 (×6): qty 2

## 2016-06-02 MED ORDER — FENTANYL CITRATE (PF) 100 MCG/2ML IJ SOLN
INTRAMUSCULAR | Status: AC
  Filled 2016-06-02: qty 2

## 2016-06-02 MED ORDER — SODIUM CHLORIDE 0.9% FLUSH
3.0000 mL | Freq: Two times a day (BID) | INTRAVENOUS | Status: DC
  Administered 2016-06-02 – 2016-06-09 (×9): 3 mL via INTRAVENOUS

## 2016-06-02 MED ORDER — PHENOL 1.4 % MT LIQD
1.0000 | OROMUCOSAL | Status: DC | PRN
Start: 2016-06-02 — End: 2016-06-09

## 2016-06-02 MED ORDER — LORATADINE 10 MG PO TABS
10.0000 mg | ORAL_TABLET | Freq: Every day | ORAL | Status: DC
  Administered 2016-06-02 – 2016-06-08 (×7): 10 mg via ORAL
  Filled 2016-06-02 (×7): qty 1

## 2016-06-02 MED ORDER — HYDROMORPHONE HCL 1 MG/ML IJ SOLN
1.0000 mg | INTRAMUSCULAR | Status: DC | PRN
  Administered 2016-06-03: 1 mg via INTRAVENOUS
  Filled 2016-06-02: qty 1

## 2016-06-02 MED ORDER — KCL IN DEXTROSE-NACL 20-5-0.45 MEQ/L-%-% IV SOLN
INTRAVENOUS | Status: AC
  Filled 2016-06-02: qty 1000

## 2016-06-02 MED ORDER — HYDROMORPHONE HCL 1 MG/ML IJ SOLN
INTRAMUSCULAR | Status: AC
Start: 2016-06-02 — End: 2016-06-03
  Filled 2016-06-02: qty 1

## 2016-06-02 MED ORDER — THROMBIN 5000 UNITS EX SOLR
CUTANEOUS | Status: DC | PRN
  Administered 2016-06-02: 07:00:00 via TOPICAL

## 2016-06-02 MED ORDER — VALSARTAN-HYDROCHLOROTHIAZIDE 160-12.5 MG PO TABS: 1.0000 | ORAL_TABLET | Freq: Every day | ORAL | Status: DC

## 2016-06-02 MED ORDER — PANTOPRAZOLE SODIUM 40 MG PO TBEC
40.0000 mg | DELAYED_RELEASE_TABLET | Freq: Every day | ORAL | Status: DC
  Administered 2016-06-03 – 2016-06-09 (×6): 40 mg via ORAL
  Filled 2016-06-02 (×6): qty 1

## 2016-06-02 MED ORDER — HYDROCHLOROTHIAZIDE 12.5 MG PO CAPS
12.5000 mg | ORAL_CAPSULE | Freq: Every day | ORAL | Status: DC
  Administered 2016-06-03 – 2016-06-09 (×5): 12.5 mg via ORAL
  Filled 2016-06-02 (×6): qty 1

## 2016-06-02 MED ORDER — ONDANSETRON HCL 4 MG/2ML IJ SOLN
INTRAMUSCULAR | Status: DC | PRN
  Administered 2016-06-02: 4 mg via INTRAVENOUS

## 2016-06-02 MED ORDER — ALUM & MAG HYDROXIDE-SIMETH 200-200-20 MG/5ML PO SUSP
30.0000 mL | Freq: Four times a day (QID) | ORAL | Status: DC | PRN
  Administered 2016-06-09: 30 mL via ORAL
  Filled 2016-06-02: qty 30

## 2016-06-02 MED ORDER — OXYCODONE HCL 5 MG PO TABS: 5.0000 mg | ORAL_TABLET | Freq: Four times a day (QID) | ORAL | Status: DC | PRN

## 2016-06-02 MED ORDER — BUPIVACAINE HCL (PF) 0.5 % IJ SOLN
INTRAMUSCULAR | Status: AC
  Filled 2016-06-02: qty 30

## 2016-06-02 MED ORDER — CEFAZOLIN SODIUM-DEXTROSE 2-4 GM/100ML-% IV SOLN
INTRAVENOUS | Status: AC
  Filled 2016-06-02: qty 100

## 2016-06-02 MED ORDER — PROSIGHT PO TABS
ORAL_TABLET | Freq: Every day | ORAL | Status: DC
  Administered 2016-06-03 – 2016-06-09 (×6): 1 via ORAL
  Filled 2016-06-02 (×7): qty 1

## 2016-06-02 MED ORDER — LIDOCAINE-EPINEPHRINE 2 %-1:100000 IJ SOLN
INTRAMUSCULAR | Status: AC
  Filled 2016-06-02: qty 1

## 2016-06-02 MED ORDER — HYDROMORPHONE HCL 1 MG/ML IJ SOLN
0.2500 mg | INTRAMUSCULAR | Status: DC | PRN
  Administered 2016-06-02 (×2): 0.5 mg via INTRAVENOUS

## 2016-06-02 MED ORDER — SODIUM CHLORIDE 0.9 % IV SOLN
250.0000 mL | INTRAVENOUS | Status: DC
  Administered 2016-06-02: 250 mL via INTRAVENOUS

## 2016-06-02 MED ORDER — LACTATED RINGERS IV SOLN
INTRAVENOUS | Status: DC | PRN
  Administered 2016-06-02: 07:00:00 via INTRAVENOUS

## 2016-06-02 MED ORDER — 0.9 % SODIUM CHLORIDE (POUR BTL) OPTIME
TOPICAL | Status: DC | PRN
  Administered 2016-06-02: 1000 mL

## 2016-06-02 MED ORDER — PRAVASTATIN SODIUM 40 MG PO TABS
80.0000 mg | ORAL_TABLET | Freq: Every day | ORAL | Status: DC
  Administered 2016-06-02 – 2016-06-08 (×7): 80 mg via ORAL
  Filled 2016-06-02 (×7): qty 2

## 2016-06-02 MED ORDER — LIDOCAINE-EPINEPHRINE 1 %-1:100000 IJ SOLN: INTRAMUSCULAR | Status: DC | PRN

## 2016-06-02 MED ORDER — POLYETHYLENE GLYCOL 3350 17 G PO PACK
17.0000 g | PACK | Freq: Every day | ORAL | Status: DC | PRN
  Administered 2016-06-04 – 2016-06-09 (×2): 17 g via ORAL
  Filled 2016-06-02 (×2): qty 1

## 2016-06-02 MED ORDER — CEFAZOLIN SODIUM-DEXTROSE 2-4 GM/100ML-% IV SOLN
2.0000 g | Freq: Three times a day (TID) | INTRAVENOUS | Status: AC
  Administered 2016-06-02 – 2016-06-03 (×2): 2 g via INTRAVENOUS
  Filled 2016-06-02: qty 100

## 2016-06-02 MED ORDER — OXYCODONE HCL 5 MG PO TABS
ORAL_TABLET | ORAL | Status: AC
  Filled 2016-06-02: qty 1

## 2016-06-02 MED ORDER — FLEET ENEMA 7-19 GM/118ML RE ENEM
1.0000 | ENEMA | Freq: Once | RECTAL | Status: AC | PRN
  Administered 2016-06-04: 1 via RECTAL
  Filled 2016-06-02: qty 1

## 2016-06-02 MED ORDER — METHOCARBAMOL 1000 MG/10ML IJ SOLN
500.0000 mg | Freq: Four times a day (QID) | INTRAVENOUS | Status: DC | PRN
  Filled 2016-06-02: qty 5

## 2016-06-02 MED ORDER — SODIUM CHLORIDE 0.9% FLUSH: 3.0000 mL | INTRAVENOUS | Status: DC | PRN

## 2016-06-02 MED ORDER — ACETAMINOPHEN 650 MG RE SUPP: 650.0000 mg | Freq: Four times a day (QID) | RECTAL | Status: DC | PRN

## 2016-06-02 MED ORDER — ZOLPIDEM TARTRATE 5 MG PO TABS
5.0000 mg | ORAL_TABLET | Freq: Every evening | ORAL | Status: DC | PRN
Start: 2016-06-02 — End: 2016-06-09

## 2016-06-02 MED ORDER — DOCUSATE SODIUM 100 MG PO CAPS
100.0000 mg | ORAL_CAPSULE | Freq: Two times a day (BID) | ORAL | Status: DC
  Administered 2016-06-02 – 2016-06-09 (×13): 100 mg via ORAL
  Filled 2016-06-02 (×13): qty 1

## 2016-06-02 MED ORDER — FENTANYL CITRATE (PF) 100 MCG/2ML IJ SOLN
INTRAMUSCULAR | Status: DC | PRN
  Administered 2016-06-02 (×2): 25 ug via INTRAVENOUS
  Administered 2016-06-02 (×2): 50 ug via INTRAVENOUS
  Administered 2016-06-02: 25 ug via INTRAVENOUS
  Administered 2016-06-02: 75 ug via INTRAVENOUS

## 2016-06-02 MED ORDER — CALCIUM CITRATE-VITAMIN D 500-400 MG-UNIT PO CHEW
1.0000 | CHEWABLE_TABLET | Freq: Every day | ORAL | Status: DC
  Filled 2016-06-02: qty 1

## 2016-06-02 MED ORDER — PHENYLEPHRINE HCL 10 MG/ML IJ SOLN
INTRAMUSCULAR | Status: DC | PRN
  Administered 2016-06-02: 20 ug/min via INTRAVENOUS

## 2016-06-02 MED ORDER — FLUTICASONE PROPIONATE 50 MCG/ACT NA SUSP
2.0000 | Freq: Every evening | NASAL | Status: DC | PRN
  Filled 2016-06-02: qty 16

## 2016-06-02 MED ORDER — PROPOFOL 10 MG/ML IV BOLUS
INTRAVENOUS | Status: AC
  Filled 2016-06-02: qty 40

## 2016-06-02 MED ORDER — KCL IN DEXTROSE-NACL 20-5-0.45 MEQ/L-%-% IV SOLN
INTRAVENOUS | Status: DC
  Administered 2016-06-02: 13:00:00 via INTRAVENOUS
  Filled 2016-06-02: qty 1000

## 2016-06-02 MED ORDER — ONDANSETRON HCL 4 MG/2ML IJ SOLN: 4.0000 mg | INTRAMUSCULAR | Status: DC | PRN

## 2016-06-02 MED ORDER — CLOBETASOL PROPIONATE 0.05 % EX OINT
TOPICAL_OINTMENT | Freq: Two times a day (BID) | CUTANEOUS | Status: DC | PRN
  Filled 2016-06-02: qty 15

## 2016-06-02 MED ORDER — METOPROLOL SUCCINATE ER 25 MG PO TB24
50.0000 mg | ORAL_TABLET | Freq: Every day | ORAL | Status: DC
  Administered 2016-06-03 – 2016-06-08 (×4): 50 mg via ORAL
  Filled 2016-06-02 (×5): qty 2

## 2016-06-02 MED ORDER — METHOCARBAMOL 500 MG PO TABS
500.0000 mg | ORAL_TABLET | Freq: Four times a day (QID) | ORAL | Status: DC | PRN
  Administered 2016-06-02 – 2016-06-09 (×8): 500 mg via ORAL
  Filled 2016-06-02 (×9): qty 1

## 2016-06-02 MED ORDER — ACETAMINOPHEN 650 MG RE SUPP: 650.0000 mg | RECTAL | Status: DC | PRN

## 2016-06-02 MED ORDER — NYSTATIN 100000 UNIT/GM EX POWD
1.0000 g | Freq: Every day | CUTANEOUS | Status: DC
  Administered 2016-06-02 – 2016-06-08 (×2): 1 g via TOPICAL
  Filled 2016-06-02: qty 15

## 2016-06-02 MED ORDER — BUPIVACAINE HCL (PF) 0.5 % IJ SOLN
INTRAMUSCULAR | Status: DC | PRN
  Administered 2016-06-02: 10 mL

## 2016-06-02 MED ORDER — THROMBIN 5000 UNITS EX SOLR
CUTANEOUS | Status: DC | PRN
  Administered 2016-06-02 (×2): 5000 [IU] via TOPICAL

## 2016-06-02 MED ORDER — PROPOFOL 10 MG/ML IV BOLUS
INTRAVENOUS | Status: DC | PRN
  Administered 2016-06-02: 130 mg via INTRAVENOUS

## 2016-06-02 MED ORDER — NEOSTIGMINE METHYLSULFATE 10 MG/10ML IV SOLN
INTRAVENOUS | Status: DC | PRN
  Administered 2016-06-02: 3 mg via INTRAVENOUS

## 2016-06-02 MED ORDER — GLYCOPYRROLATE 0.2 MG/ML IJ SOLN
INTRAMUSCULAR | Status: DC | PRN
  Administered 2016-06-02: .4 mg via INTRAVENOUS

## 2016-06-02 MED ORDER — CALCIUM CITRATE-VITAMIN D 315-200 MG-UNIT PO TABS: 1.0000 | ORAL_TABLET | Freq: Every day | ORAL | Status: DC

## 2016-06-02 MED ORDER — LIDOCAINE-EPINEPHRINE 2 %-1:100000 IJ SOLN
INTRAMUSCULAR | Status: DC | PRN
  Administered 2016-06-02: 10 mL via INTRADERMAL

## 2016-06-02 MED ORDER — LIDOCAINE HCL (CARDIAC) 20 MG/ML IV SOLN
INTRAVENOUS | Status: DC | PRN
  Administered 2016-06-02: 100 mg via INTRAVENOUS

## 2016-06-02 MED ORDER — ACETAMINOPHEN 325 MG PO TABS
650.0000 mg | ORAL_TABLET | ORAL | Status: DC | PRN
  Administered 2016-06-08: 650 mg via ORAL
  Filled 2016-06-02: qty 2

## 2016-06-02 MED ORDER — IRBESARTAN 150 MG PO TABS
150.0000 mg | ORAL_TABLET | Freq: Every day | ORAL | Status: DC
  Administered 2016-06-03 – 2016-06-08 (×4): 150 mg via ORAL
  Filled 2016-06-02 (×6): qty 1

## 2016-06-02 SURGICAL SUPPLY — 105 items
APPLIER CLIP 11 MED OPEN (CLIP) ×3
BASE IMPLANT TI 8X38X28 20D (Cage) ×2 IMPLANT
BASE IMPLANT TI 8X38X28MM 20D (Cage) ×1 IMPLANT
BASE TI BOLT 5.0X22.5 VARIABLE (Bolt) ×2 IMPLANT
BASKET BONE COLLECTION (BASKET) IMPLANT
BLADE CLIPPER SURG (BLADE) ×3 IMPLANT
BOLT BASE TI 5X17.5 VARIABLE (Bolt) ×3 IMPLANT
BOLT BASE TI 5X20 VARIABLE (Bolt) ×3 IMPLANT
BUR BARREL STRAIGHT FLUTE 4.0 (BURR) IMPLANT
CAGE MODULUS XLW 8X22X50 - 10 (Cage) ×2 IMPLANT
CANISTER SUCT 3000ML PPV (MISCELLANEOUS) ×3 IMPLANT
CARTRIDGE OIL MAESTRO DRILL (MISCELLANEOUS) IMPLANT
CLIP APPLIE 11 MED OPEN (CLIP) ×1 IMPLANT
COVER BACK TABLE 24X17X13 BIG (DRAPES) IMPLANT
COVER BACK TABLE 60X90IN (DRAPES) ×3 IMPLANT
DECANTER SPIKE VIAL GLASS SM (MISCELLANEOUS) IMPLANT
DERMABOND ADVANCED (GAUZE/BANDAGES/DRESSINGS) ×6
DERMABOND ADVANCED .7 DNX12 (GAUZE/BANDAGES/DRESSINGS) ×3 IMPLANT
DIFFUSER DRILL AIR PNEUMATIC (MISCELLANEOUS) IMPLANT
DRAPE C-ARM 42X72 X-RAY (DRAPES) ×6 IMPLANT
DRAPE C-ARMOR (DRAPES) ×6 IMPLANT
DRAPE INCISE IOBAN 66X45 STRL (DRAPES) ×3 IMPLANT
DRAPE LAPAROTOMY 100X72X124 (DRAPES) ×6 IMPLANT
DRAPE POUCH INSTRU U-SHP 10X18 (DRAPES) ×6 IMPLANT
DRSG OPSITE POSTOP 3X4 (GAUZE/BANDAGES/DRESSINGS) ×6 IMPLANT
DRSG OPSITE POSTOP 4X6 (GAUZE/BANDAGES/DRESSINGS) ×3 IMPLANT
DURAPREP 26ML APPLICATOR (WOUND CARE) ×6 IMPLANT
ELECT BLADE 4.0 EZ CLEAN MEGAD (MISCELLANEOUS) ×3
ELECT REM PT RETURN 9FT ADLT (ELECTROSURGICAL) ×6
ELECTRODE BLDE 4.0 EZ CLN MEGD (MISCELLANEOUS) ×1 IMPLANT
ELECTRODE REM PT RTRN 9FT ADLT (ELECTROSURGICAL) ×2 IMPLANT
GAUZE SPONGE 4X4 12PLY STRL (GAUZE/BANDAGES/DRESSINGS) IMPLANT
GAUZE SPONGE 4X4 16PLY XRAY LF (GAUZE/BANDAGES/DRESSINGS) IMPLANT
GLOVE BIO SURGEON STRL SZ7.5 (GLOVE) ×3 IMPLANT
GLOVE BIO SURGEON STRL SZ8 (GLOVE) ×9 IMPLANT
GLOVE BIOGEL PI IND STRL 8 (GLOVE) ×4 IMPLANT
GLOVE BIOGEL PI IND STRL 8.5 (GLOVE) ×3 IMPLANT
GLOVE BIOGEL PI INDICATOR 8 (GLOVE) ×8
GLOVE BIOGEL PI INDICATOR 8.5 (GLOVE) ×6
GLOVE ECLIPSE 7.5 STRL STRAW (GLOVE) IMPLANT
GLOVE ECLIPSE 8.0 STRL XLNG CF (GLOVE) ×6 IMPLANT
GLOVE EXAM NITRILE LRG STRL (GLOVE) IMPLANT
GLOVE EXAM NITRILE XL STR (GLOVE) IMPLANT
GLOVE EXAM NITRILE XS STR PU (GLOVE) IMPLANT
GLOVE INDICATOR 7.5 STRL GRN (GLOVE) ×3 IMPLANT
GOWN STRL REUS W/ TWL LRG LVL3 (GOWN DISPOSABLE) ×1 IMPLANT
GOWN STRL REUS W/ TWL XL LVL3 (GOWN DISPOSABLE) ×3 IMPLANT
GOWN STRL REUS W/TWL 2XL LVL3 (GOWN DISPOSABLE) ×6 IMPLANT
GOWN STRL REUS W/TWL LRG LVL3 (GOWN DISPOSABLE) ×2
GOWN STRL REUS W/TWL XL LVL3 (GOWN DISPOSABLE) ×6
INSERT FOGARTY 61MM (MISCELLANEOUS) IMPLANT
INSERT FOGARTY SM (MISCELLANEOUS) IMPLANT
KIT BASIN OR (CUSTOM PROCEDURE TRAY) ×3 IMPLANT
KIT DILATOR XLIF 5 (KITS) ×1 IMPLANT
KIT INFUSE X SMALL 1.4CC (Orthopedic Implant) ×3 IMPLANT
KIT ROOM TURNOVER OR (KITS) ×6 IMPLANT
KIT SURGICAL ACCESS MAXCESS 4 (KITS) ×3 IMPLANT
KIT XLIF (KITS) ×2
LOOP VESSEL MAXI BLUE (MISCELLANEOUS) IMPLANT
LOOP VESSEL MINI RED (MISCELLANEOUS) IMPLANT
MARKER SKIN DUAL TIP RULER LAB (MISCELLANEOUS) ×3 IMPLANT
MODULE NVM5 NEXT GEN EMG (NEEDLE) ×3 IMPLANT
NEEDLE HYPO 25X1 1.5 SAFETY (NEEDLE) ×3 IMPLANT
NEEDLE SPNL 18GX3.5 QUINCKE PK (NEEDLE) ×3 IMPLANT
NS IRRIG 1000ML POUR BTL (IV SOLUTION) ×3 IMPLANT
OIL CARTRIDGE MAESTRO DRILL (MISCELLANEOUS)
PACK LAMINECTOMY NEURO (CUSTOM PROCEDURE TRAY) ×6 IMPLANT
PAD ARMBOARD 7.5X6 YLW CONV (MISCELLANEOUS) ×6 IMPLANT
PUTTY BONE ATTRAX 10CC STRIP (Putty) ×3 IMPLANT
SPONGE INTESTINAL PEANUT (DISPOSABLE) ×6 IMPLANT
SPONGE LAP 18X18 X RAY DECT (DISPOSABLE) ×3 IMPLANT
SPONGE LAP 4X18 X RAY DECT (DISPOSABLE) IMPLANT
SPONGE SURGIFOAM ABS GEL SZ50 (HEMOSTASIS) ×3 IMPLANT
STAPLER SKIN PROX WIDE 3.9 (STAPLE) ×3 IMPLANT
STAPLER VISISTAT (STAPLE) IMPLANT
STAPLER VISISTAT 35W (STAPLE) IMPLANT
SUT PDS AB 1 CTX 36 (SUTURE) ×3 IMPLANT
SUT PROLENE 4 0 RB 1 (SUTURE)
SUT PROLENE 4-0 RB1 .5 CRCL 36 (SUTURE) IMPLANT
SUT PROLENE 5 0 CC1 (SUTURE) IMPLANT
SUT PROLENE 6 0 C 1 30 (SUTURE) IMPLANT
SUT PROLENE 6 0 CC (SUTURE) IMPLANT
SUT SILK 0 TIES 10X30 (SUTURE) ×3 IMPLANT
SUT SILK 2 0 TIES 10X30 (SUTURE) ×3 IMPLANT
SUT SILK 2 0 TIES 17X18 (SUTURE) ×2
SUT SILK 2 0SH CR/8 30 (SUTURE) IMPLANT
SUT SILK 2-0 18XBRD TIE BLK (SUTURE) ×1 IMPLANT
SUT SILK 3 0 TIES 10X30 (SUTURE) ×3 IMPLANT
SUT SILK 3 0SH CR/8 30 (SUTURE) IMPLANT
SUT VIC AB 0 CT1 27 (SUTURE) ×2
SUT VIC AB 0 CT1 27XBRD ANBCTR (SUTURE) ×1 IMPLANT
SUT VIC AB 1 CT1 18XBRD ANBCTR (SUTURE) ×1 IMPLANT
SUT VIC AB 1 CT1 8-18 (SUTURE) ×2
SUT VIC AB 2-0 CT1 18 (SUTURE) ×6 IMPLANT
SUT VIC AB 2-0 CT1 36 (SUTURE) ×3 IMPLANT
SUT VIC AB 2-0 CTB1 (SUTURE) ×3 IMPLANT
SUT VIC AB 2-0 CTX 36 (SUTURE) IMPLANT
SUT VIC AB 3-0 SH 27 (SUTURE)
SUT VIC AB 3-0 SH 27X BRD (SUTURE) IMPLANT
SUT VIC AB 3-0 SH 8-18 (SUTURE) ×12 IMPLANT
SUT VICRYL 4-0 PS2 18IN ABS (SUTURE) IMPLANT
TOWEL GREEN STERILE (TOWEL DISPOSABLE) ×6 IMPLANT
TOWEL GREEN STERILE FF (TOWEL DISPOSABLE) ×2 IMPLANT
TRAY FOLEY W/METER SILVER 16FR (SET/KITS/TRAYS/PACK) ×3 IMPLANT
WATER STERILE IRR 1000ML POUR (IV SOLUTION) ×3 IMPLANT

## 2016-06-02 NOTE — Op Note (Signed)
    NAME: Allison Mclean   MRN: [id number] DOB: 02/17/1936    DATE OF OPERATION: 06/02/2016  PREOP DIAGNOSIS: Scoliosis and degenerative disc disease  POSTOP DIAGNOSIS: Same  PROCEDURE: Anterior retroperitoneal exposure of L5-S1  EXPOSURE SURGEON: Judeth Cornfield. Scot Dock, MD  SPINE SURGEON: Erline Levine, MD  ASSIST: Verdis Prime, NP  ANESTHESIA: Gen.   EBL: Minimal  INDICATIONS: [name] is a 81 y.o. female who I was asked to provide anterior retroperitoneal exposure of L5-S1 for anterior lumbar interbody fusion.  FINDINGS: I was concerned that she had a previous sympathectomy, however direct perineal tissue dissected away fairly nicely.  TECHNIQUE: The patient was taken to the operating room and received a general anesthetic. The level of the L5-S1 disc space was marked. The abdomen was prepped and draped in usual sterile fashion. Incision was made at the marked level and the dissection carried down to the anterior rectus sheath. I then mobilized the anterior rectus sheath superiorly and inferiorly. Initially I attempted to mobilize the left rectus abdominis muscle to the left however she had multiple previous abdominal operations and the muscle was stuck to the posterior sheath. I therefore mobilized the muscle from the lateral aspect and retracted this medially. I then entered the rectum perineal space and this dissection was carried down to the iliac artery and the iliac artery bifurcation. The dissection was continued superiorly to the confluence of the iliac veins and the L5-S1 disc space was identified. The middle sacral vessels were divided and cauterized and the Thompson retractor system set up. Initially the reverse lip retractor was placed on the right side of the L5-S1 disc and then the retractor was placed on the left side allowing adequate exposure of the L5-S1 disc. Fluoroscopy was used to confirm the correct position. The remainder of the dictation is as per Dr.  Vertell Limber.  Deitra Mayo, MD, FACS [facility]  DATE OF DICTATION:   06/02/2016

## 2016-06-02 NOTE — Progress Notes (Signed)
Pt came to PACU with left radial arterial line in place. This was dc'd with tip intact at 1230.

## 2016-06-02 NOTE — OR Nursing (Signed)
nuvasive electrodes removed end of procedure

## 2016-06-02 NOTE — Brief Op Note (Signed)
06/02/2016  11:28 AM  PATIENT:  Allison Mclean  81 y.o. female  PRE-OPERATIVE DIAGNOSIS:  Kyphoscoliosis and scoliosis thoracolumbar spine, lumbar stenosis, herniated disc, radiculopathy, lumbago  POST-OPERATIVE DIAGNOSIS:  Kyphoscoliosis and scoliosis thoracolumbar spine, lumbar stenosis, herniated disc, radiculopathy, lumbago  PROCEDURE:  Procedure(s) with comments: Lumbar five -sacral one Anterior lumbar interbody fusion with Dr. Deitra Mayo; Lumbar one-two Left Anterior lateral lumbar interbody fusion (N/A) L1-2 Left Anterior lateral lumbar interbody fusion (N/A) - L1-2 Anterior lateral lumbar interbody fusion ABDOMINAL EXPOSURE (N/A)  SURGEON:  Surgeon(s) and Role: Panel 1:    * Erline Levine, MD - Primary    * Consuella Lose, MD - Assisting  Panel 2:    * Angelia Mould, MD - Primary  PHYSICIAN ASSISTANT:   ASSISTANTS: Poteat, RN   ANESTHESIA:   general  EBL:  Total I/O In: 1800 [I.V.:1800] Out: 650 [Urine:600; Blood:50]  BLOOD ADMINISTERED:none  DRAINS: none   LOCAL MEDICATIONS USED:  MARCAINE    and LIDOCAINE   SPECIMEN:  No Specimen  DISPOSITION OF SPECIMEN:  N/A  COUNTS:  YES  TOURNIQUET:  * No tourniquets in log *  DICTATION: DICTATION:   INDICATIONS:  Pateint is 81 year old female with chronic and intractable back and bilateral lower extremity pain with a kyphotic scoliotic deformity, who has previously undergone posterior decompression and fusion L 2 - L 5 levels with interbody graft of trabecular metal at  L 45 level with  It was elected to take her to surgery for anterior lumbar decompression and fusion at the L 5 S1 level and then to perform left sided XLIF at L 12 level.  PROCEDURE:  Doctor Scot Dock performed exposure and his portion of the procedure will be dictated separately.  Upon exposing the L 5 S1 level, a localizing X ray was obtained with the C arm.  I then incised the anterior annulus and performed a thorough discectomy with  wide ligamentous releases.  The endplates were cleared of disc and cartilagenous material and a thorough discectomy was performed with decompression of the ventral annulus and disc material.  After trials, a 20 degree, 8 x 38 x 28 Base Titanium Hyperlordotic ALIF cage was placed and lagged with 3, 5.0 x 20 and 22.5 mm screws in S1  and one in L 5 (5.0 x 17.5). This spacer which was selected, packed with extra small BMP and Attrax.  The implant was tamped into position and positioning was confirmed with C arm.   Locking mechanisms were engaged, soft tissues were inspected and found to be in good repair.  Retractors were removed. Fascia was closed with 1 PDS running stitch, skin edges closed with 2-0 and 3-0 vicryl sutures.  Wound was dressed with a sterile occlusive dressing.    Patient was then  placed in a right lateral decubitus position on the operative table and using orthogonally projected C-arm fluoroscopy the patient was placed so that the L 12 levels were visualized in AP and lateral plane. The patient was then taped into position.  Skin was marked along with a posterior finger dissection incision. Her flank was then prepped and draped in usual sterile fashion and incisions were made sequentially at L 12 level, working between the 11 th and 12 th ribs. Posterior finger dissection was made to enter the retroperitoneal space and then subsequently the probe was inserted into the psoas muscle from the left side initially at the L12 level. After mapping the neural elements were able to dock  the probe per the midpoint of this vertebral level and without indications electrically of too close proximity to the neural tissues. Subsequently the self-retaining tractor was.after sequential dilators were utilized the shim was employed and the interspace was cleared of psoas muscle and then incised. A thorough discectomy was performed. Instruments were used to clear the interspace of disc material. After thorough  discectomy was performed and this was performed using AP and lateral fluoroscopy an 8 lordotic by 50 x 22 mm implant was packed with remaining BMP and Attrax. This was tamped into position using the slides and its position was confirmed on AP and lateral fluoroscopy.  Hemostasis was assured the wounds were irrigated interrupted Vicryl sutures.Sterile occlusive dressing was placed with Dermabond. The patient was then extubated in the operating room and taken to recovery in stable and satisfactory condition having tolerated her operation well. Counts were correct at the end of the case.  Patient was extubated in the OR and taken to recovery having tolerated her surgery well.  Counts were correct.    Preop pelvic parameters: PT 25, PI 61, LL -46, PI-LL +15, SVA 130.  Retractor time for XLIF 18:46.  PLAN OF CARE: Admit to inpatient   PATIENT DISPOSITION:  PACU - hemodynamically stable.   Delay start of Pharmacological VTE agent (>24hrs) due to surgical blood loss or risk of bleeding: yes

## 2016-06-02 NOTE — H&P (Signed)
Patient ID:   [id number] Patient: Jordian Coate  Date of Birth: 1935/12/13 Visit Type: Office Visit   Date: 05/27/2016 01:15 PM Provider: Marchia Meiers. Vertell Limber MD   This 81 year old female presents for back pain.  History of Present Illness: 1.  back pain    Patient returns at Dr. Vertell Limber request to discuss her recent abdominal findings.  She reports experiencing severe abdominal pain which prompted an upper GI series.  The growth was found on her duodenum and stomach.  Two endoscopies were performed in [place], another endoscopy was performed at [facility], and surgery was recommended by [place].  Her [facility] physician, noting improved images on the most recent endoscopy, recommends waiting 3 months for re-evaluation.  Patient hopes she can proceed with her lumbar surgery as planned. Lumbar plan included ALIF, XLIF, exploration of previous fusion, and T10-pelvis fixation.      Medical/Surgical/Interim History Reviewed, no change.  Last detailed document date:09/23/2015.   Family History: Reviewed, no changes.  Last detailed document: 09/23/2015.   Social History: Reviewed, no changes. Last detailed document date: 09/23/2015.      MEDICATIONS(added, continued or stopped this visit): Started Medication Directions Instruction Stopped   Aspir-81 81 mg tablet,delayed release take 1 tablet by oral route  every day     calcium  ORAL take one table by mouth once a day     Diovan HCT 160 mg-25 mg tablet take 1 tablet by oral route  every day     estradiol 1 mg tablet take 1 tablet by oral route  every day     gabapentin 100mg  ORAL Take 1 tablet by mouth at bedtime     hydrocodone 5 mg-acetaminophen 325 mg tablet take 1 tablet by oral route  every 6 hours as needed for pain     Mobic 7.5 mg tablet take 1 tablet by oral route  every 2 days     Nexium 20 mg capsule,delayed release take 1 capsule by oral route  every day at least 1 hour before a meal swallowing whole. Do not crush or chew granules.      Pravachol 80 mg tablet take 1 tablet by oral route  every day     Toprol XL 50 mg tablet,extended release take 1 tablet by oral route  every day       ALLERGIES: Ingredient Reaction Medication Name Comment  ROSUVASTATIN CALCIUM  Crestor       Vitals Date Temp F BP Pulse Ht In Wt Lb BMI BSA Pain Score  05/27/2016  134/71 79 63 171 30.29  5/10      IMPRESSION The patient comes in with her abdominal findings and is cleared for surgery, her biopsies were negative for cancer.  She reports continued, severe lumbar issues and would like to go through with the procedure.  Schedule L5-S1 ALIF, L1-2 XLIF, exploration and revision of L2-5 fusion, and T6-pelvis fixation with osteotomies T 10 - T 12 or L 1.   Completed Orders (this encounter) Order Details Reason Side Interpretation Result Initial Treatment Date Region  Dietary management education, guidance, and counseling Encouraged to eat a well balanced diet and follow up with primary care physician.        Hypertension education Follow up with primary care physician.         Assessment/Plan # Detail Type Description   1. Assessment Body mass index (BMI) 30.0-30.9, adult (Z68.30).       2. Assessment Essential (primary) hypertension (I10).  Pain Assessment/Treatment Pain Scale: 5/10. Method: Numeric Pain Intensity Scale. Location: back. Onset: 09/23/2014. Duration: varies. Quality: discomforting. Pain Assessment/Treatment follow-up plan of care: Patient is taking medications as prescribed..  Schedule L5-S1 ALIF, L1-2 XLIF, exploration and revision of L2-5 fusion, and T6-pelvis fixation.  Nurse education given.  Orders: Instruction(s)/Education: Assessment Instruction  I10 Hypertension education  Z68.30 Dietary management education, guidance, and counseling             Provider:  Marchia Meiers. Vertell Limber MD  05/27/2016 12:47 PM Dictation edited by: Daine Gravel    CC Providers: Milinda Pointer [address] [place], [place] [address]-              Electronically signed by Marchia Meiers. Vertell Limber MD on 05/30/2016 03:07 PM  Patient ID:   [id number] Patient: Jess Barters  Date of Birth: [birth year] Visit Type: Office Visit   Date: 12/30/2015 09:30 AM Provider: Marchia Meiers. Vertell Limber MD   This 81 year old female presents for back pain.  History of Present Illness: 1.  back pain    The patient comes in for a pre-op visit to discuss the details of surgery. She continues to be in miserable pain and her husband reports that she was up all night last night in pain.       PAST MEDICAL HISTORY, SURGICAL HISTORY, FAMILY HISTORY, SOCIAL HISTORY AND REVIEW OF SYSTEMS  09/23/2015, which I have signed.   MEDICATIONS(added, continued or stopped this visit): Started Medication Directions Instruction Stopped   Aspir-81 81 mg tablet,delayed release take 1 tablet by oral route  every day     calcium  ORAL take one table by mouth once a day     Diovan HCT 160 mg-25 mg tablet take 1 tablet by oral route  every day     estradiol 1 mg tablet take 1 tablet by oral route  every day     gabapentin 100mg  ORAL Take 1 tablet by mouth at bedtime     hydrocodone 5 mg-acetaminophen 325 mg tablet take 1 tablet by oral route  every 6 hours as needed for pain     Mobic 7.5 mg tablet take 1 tablet by oral route  every 2 days     Pravachol 80 mg tablet take 1 tablet by oral route  every day     Toprol XL 50 mg tablet,extended release take 1 tablet by oral route  every day       ALLERGIES: Ingredient Reaction Medication Name Comment  ROSUVASTATIN CALCIUM  Crestor       Vitals Date Temp F BP Pulse Ht In Wt Lb BMI BSA Pain Score  12/30/2015  145/77 61 63 166.6 29.51  2/10      IMPRESSION The patient continues to be in miserable pain and has exhausted all conservative treatments. She would like to proceed with surgery as I have completed her spinal calculations and went over those with her today. I recommend an  L5-S1 ALIF, L1-2 XLIF, exploration and revision of L2-4 fusion, and and T6-pelvis fixation for alleviation of her symptoms.    Pain Assessment/Treatment Pain Scale: 2/10. Method: Numeric Pain Intensity Scale. Onset: 09/23/2014.  Schedule L5-S1 ALIF, L1-2 XLIF, exploration and revision of L2-4 fusion, and T6-pelvis fixation with osteotomies T 10 - T 12 levels. Nurse education given.Fitted for LSO brace.              Provider:  Marchia Meiers. Vertell Limber MD  12/30/2015 09:44 AM Dictation edited by: Johnella Moloney  CC Providers: Milinda Pointer [place], [place] [address]-              Electronically signed by Marchia Meiers Vertell Limber MD on 12/30/2015 11:15 AM  Patient ID:   [id number] Patient: Jess Barters  Date of Birth: 1935-09-21 Visit Type: Office Visit   Date: 12/11/2015 10:15 AM Provider: Marchia Meiers. Vertell Limber MD   This 81 year old female presents for back pain.  History of Present Illness: 1.  back pain    Ms. Ploch returns, reporting no improvement with physical therapy. Thoracic spinal injections have offered no relief. Pain is 6/10 today and 9-10/10 at its worst. She has to sleep on her back to get relief.   Taking hydrocodone and mobic.  The patient describes that she is miserable and unable to get any relief of her discomfort.  She says she cannot continue to live like this.       PAST MEDICAL HISTORY, SURGICAL HISTORY, FAMILY HISTORY, SOCIAL HISTORY AND REVIEW OF SYSTEMS  09/23/2015, which I have signed.   MEDICATIONS(added, continued or stopped this visit): Started Medication Directions Instruction Stopped   Aspir-81 81 mg tablet,delayed release take 1 tablet by oral route  every day     calcium  ORAL take one table by mouth once a day     Diovan HCT 160 mg-25 mg tablet take 1 tablet by oral route  every day     estradiol 1 mg tablet take 1 tablet by oral route  every day     gabapentin 100mg  ORAL Take 1 tablet by mouth at bedtime      hydrocodone 5 mg-acetaminophen 325 mg tablet take 1 tablet by oral route  every 6 hours as needed for pain     Mobic 7.5 mg tablet take 1 tablet by oral route  every 2 days     Pravachol 80 mg tablet take 1 tablet by oral route  every day     Toprol XL 50 mg tablet,extended release take 1 tablet by oral route  every day       ALLERGIES: Ingredient Reaction Medication Name Comment  ROSUVASTATIN CALCIUM  Crestor       Vitals Date Temp F BP Pulse Ht In Wt Lb BMI BSA Pain Score  12/11/2015  145/72 60 63 167 29.58  3/10      IMPRESSION The patient did not get any relief from PT or thoracic spinal injections. She is still hunched over and in severe pain, which she states she cannot bear anymore. Since we have exhausted all of the conservative treatment options, I recommend surgical intervention for alleviation of her thoracic pain that radiates to her chest. Lateral thoracic X-rays done today reveal that her spine does open up when she is laying on a bolster. I will need to do some calculations before we proceed with surgery, so she will come back for a pre-op visit to discuss the details of surgery.  Completed Orders (this encounter) Order Details Reason Side Interpretation Result Initial Treatment Date Region  Thoracic Spine- Lateral TAKEN WITH SPONGE UNDER MID THORACIC TO INCREASE LORDOTIC CURVATURE -PER JDS     12/11/2015 All Levels to All Levels   Assessment/Plan # Detail Type Description   1. Assessment Thoracic spine pain (M54.6).         Follow up in 2-3 weeks to discuss details of surgery.   Orders: Diagnostic Procedures: Assessment Procedure  M54.6 Thoracic Spine- Lateral  Provider:  Marchia Meiers. Vertell Limber MD  12/11/2015 10:53 AM Dictation edited by: Johnella Moloney    CC Providers: Milinda Pointer [place], [place] [id number]-              Electronically signed by Marchia Meiers. Vertell Limber MD on 12/11/2015 12:40 PM  Patient ID:    [id number] Patient: Jess Barters  Date of Birth: 1935/04/11 Visit Type: Office Visit   Date: 09/23/2015 11:30 AM Provider: Marchia Meiers. Vertell Limber MD   This 81 year old female presents for back pain.  History of Present Illness: 1.  back pain    [place], 81 year old retired female comes for evaluation of upper back pain.  She recalls no injury, stating pain has been increasing over the last several months.  She has had to sleep in a chair due to pain for the last month.  She describes the pain as parascapular, radiating to the chest wall. She states she has more pain on the left side and leaning back relieves her pain. She reports no trouble with her neck. Her pain started about a year after her lumbar fusion surgery.   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 Subsequent moderate to severe adjacent segment facet arthropathy. Widespread thoracic spine disc bulging. Occasional small superimposed disc protrusions (T8-9, T11-12). Subsequent multifactorial mild thoracic spinal stenosis T7-L1. Up to mild spinal cord mass effect at T8-9 and T12-L1, no thoracic spinal cord signal abnormality. Multifactorial moderate or severe neural foraminal stenosis at the right T-5, bilateral T9 and left T11 nerve levels. Lower cervical spine ACDF, new since 2010.        PAST MEDICAL/SURGICAL HISTORY   (Detailed)  Disease/disorder Onset Date Management Date Comments    Back Surgery  [facility] 09/23/2015 -    Hysterectomy      neck Surgery  [facility] 09/23/2015 -    Cholecystectomy       PAST MEDICAL HISTORY, SURGICAL HISTORY, FAMILY HISTORY, SOCIAL HISTORY  AND REVIEW OF SYSTEMS I have reviewed the patient's past medical, surgical, family and social history as well as the comprehensive review of systems as included on the [facility] history form dated 09/23/2015, which I have signed.  Family History  (Detailed) Relationship Family Member Name Deceased Age at Death Condition Onset Age Cause of Death  Father  Y      Mother  Y  Diabetes mellitus  N  Mother  Y  Hypertension  N  Mother  Y      Mother  Y  Heart Issues  N    SOCIAL HISTORY  (Detailed) Tobacco use reviewed. Preferred language is Unknown.   Smoking status: Never smoker.  SMOKING STATUS Use Status Type Smoking Status Usage Per Day Years Used Total Pack Years  no/never  Never smoker       HOME ENVIRONMENT/SAFETY The patient has not fallen in the last year.        MEDICATIONS(added, continued or stopped this visit): Started Medication Directions Instruction Stopped   Aspir-81 81 mg tablet,delayed release take 1 tablet by oral route  every day     calcium  ORAL take one  table by mouth once a day     Diovan HCT 160 mg-25 mg tablet take 1 tablet by oral route  every day     estradiol 1 mg tablet take 1 tablet by oral route  every day     gabapentin 100mg  ORAL Take 1 tablet by mouth at bedtime     hydrocodone 5 mg-acetaminophen 325 mg tablet take 1 tablet by oral route  every 6 hours as needed for pain     Mobic 7.5 mg tablet take 1 tablet by oral route  every 2 days     Pravachol 80 mg tablet take 1 tablet by oral route  every day     Toprol XL 50 mg tablet,extended release take 1 tablet by oral route  every day       ALLERGIES: Ingredient Reaction Medication Name Comment  ROSUVASTATIN CALCIUM  Crestor    Reviewed, updated.   REVIEW OF SYSTEMS System Neg/Pos Details  Constitutional Negative Chills, fatigue, fever, malaise, night sweats, weight gain and weight loss.  ENMT Negative Ear drainage, hearing loss, nasal drainage, otalgia,  sinus pressure and sore throat.  Eyes Negative Eye discharge, eye pain and vision changes.  Respiratory Negative Chronic cough, cough, dyspnea, known TB exposure and wheezing.  Cardio Negative Chest pain, claudication, edema and irregular heartbeat/palpitations.  GI Negative Abdominal pain, blood in stool, change in stool pattern, constipation, decreased appetite, diarrhea, heartburn, nausea and vomiting.  GU Negative Dysuria, hematuria, hot flashes, irregular menses, polyuria, urinary frequency, urinary incontinence and urinary retention.  Endocrine Negative Cold intolerance, heat intolerance, polydipsia and polyphagia.  Neuro Negative Dizziness, extremity weakness, gait disturbance, headache, memory impairment, numbness in extremity, seizures and tremors.  Psych Negative Anxiety, depression and insomnia.  Integumentary Negative Brittle hair, brittle nails, change in shape/size of mole(s), hair loss, hirsutism, hives, pruritus, rash and skin lesion.  MS Positive Back pain, Leg weakness,Arthritis.  MS Negative Joint pain, joint swelling, muscle weakness and neck pain.  Hema/Lymph Negative Easy bleeding, easy bruising and lymphadenopathy.  Allergic/Immuno Negative Contact allergy, environmental allergies, food allergies and seasonal allergies.  Reproductive Negative Breast discharge, breast lump(s), dysmenorrhea, dyspareunia, history of abnormal PAP smear and vaginal discharge.     Vitals Date Temp F BP Pulse Ht In Wt Lb BMI BSA Pain Score  09/23/2015  133/69 80 63 169 29.94  4/10     PHYSICAL EXAM General Level of Distress: no acute distress Overall Appearance: normal    Cardiovascular Cardiac: regular rate and rhythm without murmur  Respiratory Lungs: clear to auscultation  Neurological Recent and Remote Memory: normal Attention Span and Concentration:   normal Language: normal Fund of Knowledge: normal  Right Left Sensation: normal normal Upper Extremity  Coordination: normal normal  Lower Extremity Coordination: normal normal  Musculoskeletal Gait and Station: normal  Right Left Upper Extremity Muscle Strength: normal normal Lower Extremity Muscle Strength: normal normal Upper Extremity Muscle Tone:  normal normal Lower Extremity Muscle Tone: normal normal  Motor Strength Upper and lower extremity motor strength was tested in the clinically pertinent muscles.     Deep Tendon Reflexes  Right Left Biceps: normal normal Triceps: normal normal Brachiloradialis: normal normal Patellar: normal normal Achilles: normal normal  Sensory Sensation was tested at L1 to S1.   Cranial Nerves II. Optic Nerve/Visual Fields: normal III. Oculomotor: normal IV. Trochlear: normal V. Trigeminal: normal VI. Abducens: normal VII. Facial: normal VIII. Acoustic/Vestibular: normal IX. Glossopharyngeal: normal X. Vagus: normal XI. Spinal Accessory: normal XII. Hypoglossal: normal  Motor  and other Tests Lhermittes: negative Rhomberg: negative    Right Left Hoffman's: normal normal Clonus: normal normal Babinski: normal normal SLR: negative negative Patrick's Corky Sox): negative negative Toe Walk: normal normal Toe Lift: normal normal Heel Walk: normal normal SI Joint: nontender nontender   Additional Findings:  Pain to palpation on thoracic spine. Thoracic kyphosis. LE strength is normal. Symmetric reflexes.    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 Subsequent moderate to severe adjacent segment facet arthropathy. Widespread thoracic spine disc bulging. Occasional small superimposed disc protrusions (T8-9, T11-12). Subsequent multifactorial mild thoracic spinal stenosis T7-L1. Up to mild spinal cord mass effect at T8-9 and T12-L1, no thoracic spinal cord signal abnormality. Multifactorial moderate or severe neural foraminal stenosis at the right T5,  bilateral T9 and left T11 nerve levels. Lower cervical spine ACDF, new since 2010.     IMPRESSION The patient is experiencing thoracic pain radiating around to the chest wall. On review of her MRI, she has multilevel thoracic disc bulging, facet arthritis, and mild spinal stenosis. On confrontational testing, she has pain to palpation on her thoracic spine, but her strength and reflexes are normal. She is also kyphotic in her thoracic spine, which is causing her to hunch over and have abnormal posture. I believe her postural issues are causing most of her pain that radiates into her chest wall. I will order PT to try and correct her posture and for alleviation of her symptoms.   Completed Orders (this encounter) Order Details Reason Side Interpretation Result Initial Treatment Date Region  Dietary management education, guidance, and counseling encouraged to eat a well balanced diet and follow up with primary care doctor.        Scoliosis- AP/Lat       All Levels   Assessment/Plan # Detail Type Description   1. Assessment Kyphoscoliosis and scoliosis (M41.9).       2. Assessment Thoracic spine pain (M54.6).       3. Assessment Low back pain, unspecified back pain laterality, with sciatica presence unspecified (M54.5).       4. Assessment Radiculopathy of thoracic region (M54.14).       5. Assessment Body mass index (BMI) 29.0-29.9, adult [id number]).   Plan Orders Today's instructions / counseling include(s) Dietary management education, guidance, and counseling.         Pain Assessment/Treatment Pain Scale: 4/10. Method: Numeric Pain Intensity Scale. Location: back. Onset: 09/23/2014. Duration: varies. Quality: discomforting. Pain Assessment/Treatment follow-up plan of care: Patient is taking medications prescribed.  Fall Risk Plan The patient has not fallen in the last year.  Ordered PT at [facility]. Follow up in 2 months.  Orders: Diagnostic Procedures: Assessment  Procedure  M54.6 Physical Therapy - evaluate and treat  M54.6 Return to Clinic  M54.6 Scoliosis- AP/Lat  Instruction(s)/Education: Assessment Instruction  Z68.29 Dietary management education, guidance, and counseling             Provider:  Marchia Meiers. Vertell Limber MD  09/23/2015 01:08 PM Dictation edited by: Marchia Meiers. [facility]    CC Providers: Milinda Pointer [place], [place] [address]-              Electronically signed by Marchia Meiers Vertell Limber MD on 09/27/2015 03:24 PM

## 2016-06-02 NOTE — OR Nursing (Signed)
Nuvasive neuro monitoring electrodes placed to upper torso and lower extremity for nerve monitoring

## 2016-06-02 NOTE — H&P (View-Only) (Signed)
Patient name: Allison Mclean MRN: [id number] DOB: 1935/04/21 Sex: female  REASON FOR CONSULT: Evaluate for anterior retroperitoneal exposure of L5-S1. Referred by Dr. Vertell Limber.  HPI: Allison Mclean is a 81 y.o. female, with a long history of spine issues. She's had multiple previous operations on her back. Her pain is aggravated by any activity and relieved somewhat with rest. Her symptoms have gradually progressed.  I have reviewed the records that were sent with the patient. She has been in tremendous pain and is felt to be a good candidate for anterior lumbar interbody fusion at L5-S1. She has tried all conservative measures unsuccessfully. We have been asked to provide anterior rector perineal exposure of L5-S1.  Past Medical History:  Diagnosis Date  . Abnormal MRI, thoracic spine 07/02/2015   IMPRESSION: 1. 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 Subsequent moderate to severe adjacent segment facet arthropathy. 2. Widespread thoracic spine disc bulging. Occasional small superimposed disc protrusions (T8-9, T11-12). 3. Subsequent multifactorial mild thoracic spinal stenosis T7-8, T8-9, T9-10, T10-11, T11-12, and T12-L1. Up to mild spi  . Acid reflux 11/22/2014  . 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 Allergy   . Chronic thoracic radicular pain (T5/T6) (Left) 07/02/2015  . Essential (primary) hypertension 12/23/2013  . GERD (gastroesophageal reflux disease)   . Heart murmur   . History of fusion of cervical spine 07/02/2015  . History of lumbar fusion (12/19/2013) (posterior lumbar fusion from L2-L5) 07/02/2015   Pedicle screws and posterior hardware at L5 L4-L3 and L2. Neil disc block at the L4-5 disc space. Slight anterior subluxation of L4 on L5. Different configuration and this had L3-4. Degenerative changes of the lumbar disc spaces. L5-S1 disc space well-maintained .   Marland Kitchen HLD (hyperlipidemia) 11/07/2013  . Hyperlipidemia   . Hypertension   .  Infection    abcess in left upper tooth   . Jackhammer esophagus   . Nerve root pain 07/02/2015   Thoracic pain is likely to be secondary to severe left T5 foraminal stenosis.   . Neuropathic pain 08/05/2015  . Opiate use (10 MME/day) 07/02/2015  . Osteopenia, senile 07/02/2015  . Supraventricular tachycardia ([place]) 11/07/2013  . Thoracic central spinal stenosis (T7-8, T8-9, T9-10, T10-11, T11-12, and T12-L1) 07/02/2015  . Thoracic facet syndrome (Left) 07/02/2015  . Thoracic foraminal stenosis (Moderate to severe at: Right T5; Bilateral T9; Left T11) 07/02/2015  . Thoracic spondylosis with radiculopathy (Left) 07/09/2015  . Wears dentures    partial lower    Family History  Problem Relation Age of Onset  . Diabetes Mother   . Arthritis Mother   . Heart disease Mother   . Hypertension Mother   . Diabetes Father   . Asthma Father   . Heart disease Father   . Hypertension Father   . Stroke Father   . Breast cancer Neg Hx     SOCIAL HISTORY: Social History   Social History  . Marital status: Married    Spouse name: N/A  . Number of children: N/A  . Years of education: N/A   Occupational History  . Not on file.   Social History Main Topics  . Smoking status: Never Smoker  . Smokeless tobacco: Never Used  . Alcohol use No  . Drug use: No  . Sexual activity: Not on file   Other Topics Concern  . Not on file   Social History Narrative  . No narrative on file  Allergies  Allergen Reactions  . Atorvastatin Other (See Comments)    Chest pain    . Rosuvastatin Other (See Comments)    Causes chest pain      Current Outpatient Prescriptions  Medication Sig Dispense Refill  . aspirin EC 81 MG tablet Take 81 mg by mouth daily.     . calcium citrate-vitamin D (CITRACAL+D) 315-200 MG-UNIT tablet Take by mouth daily.     . clobetasol ointment (TEMOVATE) 0.05 % APPLY TWICE DAILY TO SORE AREAS IN MOUTH UNTIL HEALED. AVOID FACE AND SKIN FOLDS.  1  . esomeprazole (NEXIUM) 20 MG  capsule Take 1 capsule (20 mg total) by mouth 2 (two) times daily before a meal. 60 capsule 11  . estradiol (ESTRACE) 1 MG tablet Take 1 mg by mouth daily.     . fluticasone (FLONASE) 50 MCG/ACT nasal spray USE 2 PUFFS EACH NOSTRIL AT BEDTIME AS NEEDED FOR CONGESTION    . meloxicam (MOBIC) 15 MG tablet Take 15 mg by mouth daily.    . metoprolol succinate (TOPROL-XL) 50 MG 24 hr tablet Take 50 mg by mouth daily.  3  . Multiple Vitamins-Minerals (OCUVITE EXTRA PO) Take 1 tablet by mouth daily.     Marland Kitchen nystatin (MYCOSTATIN/NYSTOP) powder Apply 1 g topically daily.     . pravastatin (PRAVACHOL) 80 MG tablet Take 80 mg by mouth at bedtime.  3  . sennosides-docusate sodium (SENOKOT-S) 8.6-50 MG tablet Take 1 tablet by mouth daily as needed for constipation.    . valsartan-hydrochlorothiazide (DIOVAN-HCT) 160-12.5 MG per tablet Take 1 tablet by mouth daily.  3  . oxyCODONE (OXY IR/ROXICODONE) 5 MG immediate release tablet Take 1 tablet (5 mg total) by mouth every 6 (six) hours as needed for severe pain. 120 tablet 0   No current facility-administered medications for this visit.     REVIEW OF SYSTEMS:  [X]  denotes positive finding, [ ]  denotes negative finding Cardiac  Comments:  Chest pain or chest pressure:    Shortness of breath upon exertion:    Short of breath when lying flat:    Irregular heart rhythm:        Vascular    Pain in calf, thigh, or hip brought on by ambulation:    Pain in feet at night that wakes you up from your sleep:     Blood clot in your veins:    Leg swelling:         Pulmonary    Oxygen at home:    Productive cough:     Wheezing:         Neurologic    Sudden weakness in arms or legs:     Sudden numbness in arms or legs:     Sudden onset of difficulty speaking or slurred speech:    Temporary loss of vision in one eye:     Problems with dizziness:         Gastrointestinal    Blood in stool:     Vomited blood:         Genitourinary    Burning when urinating:      Blood in urine:        Psychiatric    Major depression:         Hematologic    Bleeding problems:    Problems with blood clotting too easily:        Skin    Rashes or ulcers:        Constitutional  Fever or chills:      PHYSICAL EXAM: Vitals:   05/20/16 1253  BP: 140/65  Pulse: 67  Resp: 18  Temp: (!) 96.6 F (35.9 C)  TempSrc: Oral  SpO2: 100%  Weight: 167 lb 6.4 oz (75.9 kg)  Height: 5\' 1"  (1.549 m)    GENERAL: The patient is a well-nourished female, in no acute distress. The vital signs are documented above. CARDIAC: There is a regular rate and rhythm.  VASCULAR: I do not detect carotid bruits. She has palpable femoral pulses. She has a palpable left dorsalis pedis pulse. I cannot palpate a right dorsalis pedis pulse. Both feet are warm and well perfused. She has mild bilateral lower extremity swelling. PULMONARY: There is good air exchange bilaterally without wheezing or rales. ABDOMEN: Soft and non-tender with normal pitched bowel sounds.  MUSCULOSKELETAL: There are no major deformities or cyanosis. NEUROLOGIC: No focal weakness or paresthesias are detected. SKIN: There are no ulcers or rashes noted. PSYCHIATRIC: The patient has a normal affect.  DATA:   CT SCAN ABDOMEN PELVIS: I have reviewed the CT scan from January 2017. This does show some calcific disease in the aorta but minimal calcium in the left common iliac artery. I think we would be below this level IV and L5-S1 exposure.  MEDICAL ISSUES:  DEGENERATIVE DISC DISEASE: I have been asked to provide anterior rectoperineal exposure of L5-S1. She appears to be a reasonable candidate for this. She has had previous abdominal operations including hysterectomy, appendectomy, and cholecystectomy. She's also had a sympathectomy in for January. Thus we could potentially find some scar tissue to make the dissection more difficult. She does have some calcium in her infrarenal aorta and some in the left common  iliac artery that I think will be below that level.  I have reviewed our role in exposure of the spine in order to allow anterior lumbar interbody fusion at the appropriate levels. We have discussed the potential complications of surgery, including but not limited to, arterial or venous injury, thrombosis, or bleeding. We have also discussed the potential risks of wound healing problems, the development of a hernia, nerve injury, leg swelling, or other unpredictable medical problems.  All the patient's questions were answered and they are agreeable to proceed.  Deitra Mayo [facility] [phone]

## 2016-06-02 NOTE — Interval H&P Note (Signed)
History and Physical Interval Note:  06/02/2016 7:37 AM  Allison Mclean  has presented today for surgery, with the diagnosis of Kyphoscoliosis and scoliosis  The various methods of treatment have been discussed with the patient and family. After consideration of risks, benefits and other options for treatment, the patient has consented to  Procedure(s) with comments: L5-S1 Anterior lumbar interbody fusion with Dr. Deitra Mayo; L1-2 Left Anterior lateral lumbar interbody fusion (N/A) - L5-S1 Anterior lumbar interbody fusion with Dr. Deitra Mayo L1-2 Left Anterior lateral lumbar interbody fusion (N/A) - L1-2 Anterior lateral lumbar interbody fusion ABDOMINAL EXPOSURE (N/A) as a surgical intervention .  The patient's history has been reviewed, patient examined, no change in status, stable for surgery.  I have reviewed the patient's chart and labs.  Questions were answered to the patient's satisfaction.     Jereme Loren D

## 2016-06-02 NOTE — Anesthesia Postprocedure Evaluation (Signed)
Anesthesia Post Note  Patient: Allison Mclean  Procedure(s) Performed: Procedure(s) (LRB): Lumbar five -sacral one Anterior lumbar interbody fusion with Dr. Deitra Mayo; Lumbar one-two Left Anterior lateral lumbar interbody fusion (N/A) L1-2 Left Anterior lateral lumbar interbody fusion (N/A) ABDOMINAL EXPOSURE (N/A)  Patient location during evaluation: PACU Anesthesia Type: General Level of consciousness: awake and alert Pain management: pain level controlled Vital Signs Assessment: post-procedure vital signs reviewed and stable Respiratory status: spontaneous breathing, nonlabored ventilation, respiratory function stable and patient connected to nasal cannula oxygen Cardiovascular status: blood pressure returned to baseline and stable Postop Assessment: no signs of nausea or vomiting Anesthetic complications: no       Last Vitals:  Vitals:   06/02/16 1245 06/02/16 1315  BP: (!) 145/66 (!) 156/95  Pulse: 87 88  Resp: 14 10  Temp:      Last Pain:  Vitals:   06/02/16 1300  TempSrc:   PainSc: 2                  Treyden Hakim,W. EDMOND

## 2016-06-02 NOTE — Op Note (Signed)
06/02/2016  11:28 AM  PATIENT:  Allison Mclean  81 y.o. female  PRE-OPERATIVE DIAGNOSIS:  Kyphoscoliosis and scoliosis thoracolumbar spine, lumbar stenosis, herniated disc, radiculopathy, lumbago  POST-OPERATIVE DIAGNOSIS:  Kyphoscoliosis and scoliosis thoracolumbar spine, lumbar stenosis, herniated disc, radiculopathy, lumbago  PROCEDURE:  Procedure(s) with comments: Lumbar five -sacral one Anterior lumbar interbody fusion with Dr. Deitra Mayo; Lumbar one-two Left Anterior lateral lumbar interbody fusion (N/A) L1-2 Left Anterior lateral lumbar interbody fusion (N/A) - L1-2 Anterior lateral lumbar interbody fusion ABDOMINAL EXPOSURE (N/A)  SURGEON:  Surgeon(s) and Role: Panel 1:    * Erline Levine, MD - Primary    * Consuella Lose, MD - Assisting  Panel 2:    * Angelia Mould, MD - Primary  PHYSICIAN ASSISTANT:   ASSISTANTS: Poteat, RN   ANESTHESIA:   general  EBL:  Total I/O In: 1800 [I.V.:1800] Out: 650 [Urine:600; Blood:50]  BLOOD ADMINISTERED:none  DRAINS: none   LOCAL MEDICATIONS USED:  MARCAINE    and LIDOCAINE   SPECIMEN:  No Specimen  DISPOSITION OF SPECIMEN:  N/A  COUNTS:  YES  TOURNIQUET:  * No tourniquets in log *  DICTATION: DICTATION:   INDICATIONS:  Pateint is 81 year old female with chronic and intractable back and bilateral lower extremity pain with a kyphotic scoliotic deformity, who has previously undergone posterior decompression and fusion L 2 - L 5 levels with interbody graft of trabecular metal at  L 45 level with  It was elected to take her to surgery for anterior lumbar decompression and fusion at the L 5 S1 level and then to perform left sided XLIF at L 12 level.  PROCEDURE:  Doctor Scot Dock performed exposure and his portion of the procedure will be dictated separately.  Upon exposing the L 5 S1 level, a localizing X ray was obtained with the C arm.  I then incised the anterior annulus and performed a thorough discectomy with  wide ligamentous releases.  The endplates were cleared of disc and cartilagenous material and a thorough discectomy was performed with decompression of the ventral annulus and disc material.  After trials, a 20 degree, 8 x 38 x 28 Base Titanium Hyperlordotic ALIF cage was placed and lagged with 3, 5.0 x 20 and 22.5 mm screws in S1  and one in L 5 (5.0 x 17.5). This spacer which was selected, packed with extra small BMP and Attrax.  The implant was tamped into position and positioning was confirmed with C arm.   Locking mechanisms were engaged, soft tissues were inspected and found to be in good repair.  Retractors were removed. Fascia was closed with 1 PDS running stitch, skin edges closed with 2-0 and 3-0 vicryl sutures.  Wound was dressed with a sterile occlusive dressing.    Patient was then  placed in a right lateral decubitus position on the operative table and using orthogonally projected C-arm fluoroscopy the patient was placed so that the L 12 levels were visualized in AP and lateral plane. The patient was then taped into position.  Skin was marked along with a posterior finger dissection incision. Her flank was then prepped and draped in usual sterile fashion and incisions were made sequentially at L 12 level, working between the 11 th and 12 th ribs. Posterior finger dissection was made to enter the retroperitoneal space and then subsequently the probe was inserted into the psoas muscle from the left side initially at the L12 level. After mapping the neural elements were able to dock  the probe per the midpoint of this vertebral level and without indications electrically of too close proximity to the neural tissues. Subsequently the self-retaining tractor was.after sequential dilators were utilized the shim was employed and the interspace was cleared of psoas muscle and then incised. A thorough discectomy was performed. Instruments were used to clear the interspace of disc material. After thorough  discectomy was performed and this was performed using AP and lateral fluoroscopy an 8 lordotic by 50 x 22 mm implant was packed with remaining BMP and Attrax. This was tamped into position using the slides and its position was confirmed on AP and lateral fluoroscopy.  Hemostasis was assured the wounds were irrigated interrupted Vicryl sutures.Sterile occlusive dressing was placed with Dermabond. The patient was then extubated in the operating room and taken to recovery in stable and satisfactory condition having tolerated her operation well. Counts were correct at the end of the case.  Patient was extubated in the OR and taken to recovery having tolerated her surgery well.  Counts were correct.    Preop pelvic parameters: PT 25, PI 61, LL -46, PI-LL +15, SVA 130.  Retractor time for XLIF 18:46.  PLAN OF CARE: Admit to inpatient   PATIENT DISPOSITION:  PACU - hemodynamically stable.   Delay start of Pharmacological VTE agent (>24hrs) due to surgical blood loss or risk of bleeding: yes

## 2016-06-02 NOTE — Anesthesia Procedure Notes (Signed)
Procedure Name: Intubation Date/Time: 06/02/2016 8:49 AM Performed by: Tressia Miners LEFFEW Pre-anesthesia Checklist: Patient identified, Patient being monitored, Timeout performed, Emergency Drugs available and Suction available Patient Re-evaluated:Patient Re-evaluated prior to inductionOxygen Delivery Method: Circle System Utilized Preoxygenation: Pre-oxygenation with 100% oxygen Intubation Type: IV induction Ventilation: Mask ventilation without difficulty Laryngoscope Size: Mac and 3 Grade View: Grade I Tube type: Oral Tube size: 7.0 mm Number of attempts: 1 Airway Equipment and Method: Stylet Placement Confirmation: ETT inserted through vocal cords under direct vision,  positive ETCO2 and breath sounds checked- equal and bilateral Secured at: 21 cm Tube secured with: Tape Dental Injury: Teeth and Oropharynx as per pre-operative assessment

## 2016-06-02 NOTE — Interval H&P Note (Signed)
History and Physical Interval Note:  06/02/2016 7:22 AM  Allison Mclean  has presented today for surgery, with the diagnosis of Kyphoscoliosis and scoliosis  The various methods of treatment have been discussed with the patient and family. After consideration of risks, benefits and other options for treatment, the patient has consented to  Procedure(s) with comments: L5-S1 Anterior lumbar interbody fusion with Dr. Deitra Mayo; L1-2 Left Anterior lateral lumbar interbody fusion (N/A) - L5-S1 Anterior lumbar interbody fusion with Dr. Deitra Mayo L1-2 Left Anterior lateral lumbar interbody fusion (N/A) - L1-2 Anterior lateral lumbar interbody fusion ABDOMINAL EXPOSURE (N/A) as a surgical intervention .  The patient's history has been reviewed, patient examined, no change in status, stable for surgery.  I have reviewed the patient's chart and labs.  Questions were answered to the patient's satisfaction.     Deitra Mayo

## 2016-06-02 NOTE — Progress Notes (Signed)
Lunch relief by Ly RN

## 2016-06-02 NOTE — Transfer of Care (Signed)
Immediate Anesthesia Transfer of Care Note  Patient: Allison Mclean  Procedure(s) Performed: Procedure(s) with comments: Lumbar five -sacral one Anterior lumbar interbody fusion with Dr. Deitra Mayo; Lumbar one-two Left Anterior lateral lumbar interbody fusion (N/A) L1-2 Left Anterior lateral lumbar interbody fusion (N/A) - L1-2 Anterior lateral lumbar interbody fusion ABDOMINAL EXPOSURE (N/A)  Patient Location: PACU  Anesthesia Type:General  Level of Consciousness: awake, alert , oriented, patient cooperative and responds to stimulation  Airway & Oxygen Therapy: Patient Spontanous Breathing and Patient connected to face mask oxygen  Post-op Assessment: Report given to RN, Post -op Vital signs reviewed and stable and Patient moving all extremities X 4  Post vital signs: Reviewed and stable  Last Vitals:  Vitals:   06/02/16 0721 06/02/16 0722  BP:    Pulse: 84 83  Resp:    Temp:      Last Pain:  Vitals:   06/02/16 0617  TempSrc: Oral  PainSc: 6       Patients Stated Pain Goal: 8 ([id number])  Complications: No apparent anesthesia complications

## 2016-06-02 NOTE — Progress Notes (Signed)
Awake, alert, conversant, MAEW with good strength.  Doing well.   

## 2016-06-03 ENCOUNTER — Inpatient Hospital Stay (HOSPITAL_COMMUNITY): Payer: Medicare Other

## 2016-06-03 ENCOUNTER — Encounter (HOSPITAL_COMMUNITY): Payer: Self-pay | Admitting: Neurosurgery

## 2016-06-03 MED ORDER — CALCIUM CARBONATE-VITAMIN D 500-200 MG-UNIT PO TABS
1.0000 | ORAL_TABLET | Freq: Every day | ORAL | Status: DC
  Administered 2016-06-03 – 2016-06-09 (×6): 1 via ORAL
  Filled 2016-06-03 (×6): qty 1

## 2016-06-03 MED ORDER — SENNA 8.6 MG PO TABS
2.0000 | ORAL_TABLET | Freq: Every evening | ORAL | Status: DC | PRN
  Administered 2016-06-03 – 2016-06-06 (×2): 17.2 mg via ORAL
  Filled 2016-06-03 (×2): qty 2

## 2016-06-03 NOTE — Progress Notes (Signed)
Subjective: Patient reports doing well.  Sore in back.  Dressings CDI.  Abdomen soft.  Objective: Vital signs in last 24 hours: Temp:  [97.3 F (36.3 C)-99.3 F (37.4 C)] 99.3 F (37.4 C) (02/28 0828) Pulse Rate:  [83-104] 91 (02/28 0828) Resp:  [8-20] 16 (02/28 0828) BP: (133-166)/(52-95) 133/52 (02/28 0828) SpO2:  [90 %-100 %] 100 % (02/28 0828) Arterial Line BP: (187-188)/(67-69) 187/69 (02/27 1200)  Intake/Output from previous day: 02/27 0701 - 02/28 0700 In: 3086.3 [P.O.:200; I.V.:2886.3] Out: 1925 [Urine:1875; Blood:50] Intake/Output this shift: No intake/output data recorded.  Physical Exam: Strength full.  No numbness.  Doing well.    Lab Results: No results for input(s): WBC, HGB, HCT, PLT in the last 72 hours. BMET No results for input(s): NA, K, CL, CO2, GLUCOSE, BUN, CREATININE, CALCIUM in the last 72 hours.  Studies/Results: Dg Lumbar Spine 2-3 Views  Result Date: 06/02/2016 CLINICAL DATA:  Lumbar disc disease. EXAM: DG C-ARM 61-120 MIN; LUMBAR SPINE - 2-3 VIEW COMPARISON:  None. FINDINGS: AP and lateral C-arm images demonstrate the patient undergoing anterior fusion at L5-S1 and interbody fusion at L1-2. Hardware devices appear in good position in the AP and lateral projections. IMPRESSION: Anterior interbody fusion performed at L5-S1. Interbody fusion performed at L1-2. Electronically Signed   By: Lorriane Shire M.D.   On: 06/02/2016 12:03   Dg C-arm 61-120 Min  Result Date: 06/02/2016 CLINICAL DATA:  Lumbar disc disease. EXAM: DG C-ARM 61-120 MIN; LUMBAR SPINE - 2-3 VIEW COMPARISON:  None. FINDINGS: AP and lateral C-arm images demonstrate the patient undergoing anterior fusion at L5-S1 and interbody fusion at L1-2. Hardware devices appear in good position in the AP and lateral projections. IMPRESSION: Anterior interbody fusion performed at L5-S1. Interbody fusion performed at L1-2. Electronically Signed   By: Lorriane Shire M.D.   On: 06/02/2016 12:03   Dg Or  Local Abdomen  Result Date: 06/02/2016 CLINICAL DATA:  Instrument count. EXAM: OR LOCAL ABDOMEN COMPARISON:  None. FINDINGS: Patient is status post anterior fusion of the lumbosacral junction. Posterior fusion changes partially imaged in the lumbar spine. No unexpected radiopaque foreign body. IMPRESSION: No unexpected radiopaque foreign body. Electronically Signed   By: Rolm Baptise M.D.   On: 06/02/2016 09:51    Assessment/Plan: Patient doing well.  Mobilize in brace.  Standing scoliosis X rays in brace today.    LOS: 1 day    Anice Wilshire D, MD 06/03/2016, 8:30 AM

## 2016-06-03 NOTE — Evaluation (Signed)
Occupational Therapy Evaluation Patient Details Name: Allison Mclean MRN: [id number] DOB: April 02, 1936 Today's Date: 06/03/2016    History of Present Illness Pt is a 81 y/o female who presents s/p L1-L2 ALIF and L5-S1 ALIF on 06/02/16. Pt to go for second stage of surgery possibly 06/05/16.   Clinical Impression   Patient is s/p L1-L2 ALIF L5-s1 ALIF 2/27 surgery resulting in functional limitations due to the deficits listed below (see OT problem list). Pt has pending second level surgery for 06/05/16. Pt currently requires mod (A) over all for adls due to pain and maintaining back precautions.  Patient will benefit from skilled OT acutely to increase independence and safety with ADLS to allow discharge SNF. Pt has established pending SNF placement prior to this admission.      Follow Up Recommendations  SNF    Equipment Recommendations  Other (comment) (defer to SNF)    Recommendations for Other Services       Precautions / Restrictions Precautions Precautions: Fall;Back Precaution Booklet Issued: Yes (comment) Precaution Comments: reviewed back handout with patient for adsl Required Braces or Orthoses: Spinal Brace Spinal Brace: Thoracolumbosacral orthotic;Applied in sitting position (Not present during session - pt to receive today) Restrictions Weight Bearing Restrictions: No      Mobility Bed Mobility Overal bed mobility: Needs Assistance Bed Mobility: Rolling;Sidelying to Sit;Supine to Sit Rolling: Min assist Sidelying to sit: Mod assist Supine to sit: Mod assist     General bed mobility comments: pt with mod cues for sequence exiting bed on L side with facial grimace. pt unable to push up with L UE  Transfers Overall transfer level: Needs assistance Equipment used: 1 person hand held assist Transfers: Sit to/from Stand Sit to Stand: Min guard              Balance                                            ADL Overall ADL's : Needs  assistance/impaired Eating/Feeding: Independent   Grooming: Wash/dry hands;Supervision/safety           Upper Body Dressing : Minimal assistance;Standing Upper Body Dressing Details (indicate cue type and reason): don brace educated on leaving L side hooked Lower Body Dressing: Minimal assistance   Toilet Transfer: Supervision/safety   Toileting- Clothing Manipulation and Hygiene: Supervision/safety       Functional mobility during ADLs: Min guard       Vision Baseline Vision/History: Wears glasses Wears Glasses: Reading only (spouse took them home after surgery)       Perception     Praxis      Pertinent Vitals/Pain Pain Assessment: Faces Faces Pain Scale: Hurts even more Pain Location: Incision site Pain Descriptors / Indicators: Operative site guarding;Discomfort Pain Intervention(s): Monitored during session;Premedicated before session;Repositioned     Hand Dominance Right   Extremity/Trunk Assessment Upper Extremity Assessment Upper Extremity Assessment: Overall WFL for tasks assessed   Lower Extremity Assessment Lower Extremity Assessment: Defer to PT evaluation   Cervical / Trunk Assessment Cervical / Trunk Assessment: Kyphotic;Other exceptions Cervical / Trunk Exceptions: Scoliosis   Communication Communication Communication: No difficulties   Cognition Arousal/Alertness: Awake/alert Behavior During Therapy: Flat affect Overall Cognitive Status: Within Functional Limits for tasks assessed                     General Comments  Exercises       Shoulder Instructions      Home Living Family/patient expects to be discharged to:: Skilled nursing facility Living Arrangements: Spouse/significant other Available Help at Discharge: Family;Available 24 hours/day Type of Home: House Home Access: Stairs to enter [employer] of Steps: 2   Home Layout: One level     Bathroom Shower/Tub: Radiographer, therapeutic: Standard Bathroom Accessibility: Yes   Home Equipment: Shower seat - built in   Additional Comments: spouse present during OT eval with significant personal balance deficits and using cane. Spouse very winded after transfer to get lunch      Prior Functioning/Environment Level of Independence: Independent                 OT Problem List: Decreased strength;Decreased activity tolerance;Impaired balance (sitting and/or standing);Decreased safety awareness;Decreased knowledge of use of DME or AE;Decreased knowledge of precautions;Pain      OT Treatment/Interventions: Self-care/ADL training;Therapeutic exercise;Energy conservation;DME and/or AE instruction;Therapeutic activities;Patient/family education;Balance training    OT Goals(Current goals can be found in the care plan section) Acute Rehab OT Goals Patient Stated Goal: Home after rehab OT Goal Formulation: With patient Time For Goal Achievement: 06/17/16 Potential to Achieve Goals: Good  OT Frequency: Min 2X/week   Barriers to D/C:            Co-evaluation              End of Session Equipment Utilized During Treatment: Back brace;Gait belt Nurse Communication: Mobility status;Precautions  Activity Tolerance: Patient tolerated treatment well Patient left: in chair;with call bell/phone within reach;with family/visitor present  OT Visit Diagnosis: Other abnormalities of gait and mobility (R26.89)                ADL either performed or assessed with clinical judgement  Time: 1145-1200 OT Time Calculation (min): 15 min Charges:  OT General Charges $OT Visit: 1 Procedure OT Evaluation $OT Eval Moderate Complexity: 1 Procedure G-Codes:      Jeri Modena   OTR/L [phone] Office: [phone] .   Parke Poisson B 06/03/2016, 2:17 PM

## 2016-06-03 NOTE — Progress Notes (Signed)
   VASCULAR SURGERY ASSESSMENT & PLAN:  1 Day Post-Op s/p: Anterior retroperitoneal exposure of L5-S1  Doing well. Vascular will be available as needed.  SUBJECTIVE: No complaints.  PHYSICAL EXAM: Vitals:   06/02/16 2000 06/02/16 2355 06/03/16 0500 06/03/16 0828  BP: (!) 148/69 (!) 151/69 137/63 (!) 133/52  Pulse: 97 (!) 104 97 91  Resp: 18 18 18 16   Temp: 98.2 F (36.8 C) 99 F (37.2 C) 98.9 F (37.2 C) 99.3 F (37.4 C)  TempSrc: Oral Oral Oral   SpO2: 98% 97% 99% 100%   Palpable left dorsalis pedis pulse.  Active Problems:   Kyphoscoliosis and scoliosis  Gae Gallop Beeper: [id number] 06/03/2016

## 2016-06-03 NOTE — Progress Notes (Signed)
Orthopedic Tech Progress Note Patient Details:  Allison Mclean Aug 27, 1935 [id number]  Patient ID: [name], female   DOB: 10/08/1935, 81 y.o.   MRN: [id number]   Hildred Priest 06/03/2016, 9:07 AM Called in bio-tech brace order; spoke with Bella Kennedy

## 2016-06-03 NOTE — Evaluation (Signed)
Physical Therapy Evaluation Patient Details Name: Allison Mclean MRN: [id number] DOB: 13-Jul-1935 Today's Date: 06/03/2016   History of Present Illness  Pt is a 81 y/o female who presents s/p L1-L2 ALIF and L5-S1 ALIF on 06/02/16. Pt to go for second stage of surgery possibly 06/05/16.  Clinical Impression  Pt admitted with above diagnosis. Pt currently with functional limitations due to the deficits listed below (see PT Problem List). At the time of PT eval pt was able to perform transfers and ambulation with min guard to min assist for balance support and safety. Mobility was limited this session as pt did not have her brace yet. Pt reports plan for SNF rehab after second stage of surgery (planned for Friday 3/2). Pt will benefit from skilled PT to increase their independence and safety with mobility to allow discharge to the venue listed below.       Follow Up Recommendations SNF;Supervision/Assistance - 24 hour    Equipment Recommendations  Other (comment) (TBD after next stage of surgery)    Recommendations for Other Services       Precautions / Restrictions Precautions Precautions: Fall;Back Precaution Booklet Issued: Yes (comment) Precaution Comments: Reviewed handout in detail and pt was cued for precautions during functional mobility.  Required Braces or Orthoses: Spinal Brace Spinal Brace: Thoracolumbosacral orthotic;Applied in sitting position (Not present during session - pt to receive today) Restrictions Weight Bearing Restrictions: No      Mobility  Bed Mobility Overal bed mobility: Needs Assistance Bed Mobility: Rolling;Sidelying to Sit;Sit to Sidelying Rolling: Supervision Sidelying to sit: Min assist     Sit to sidelying: Min guard General bed mobility comments: VC's for proper log roll technique. Pt was able to transition to EOB with min assist and from EOB with min guard. Increased time required for adjusting position in bed.   Transfers Overall transfer level:  Needs assistance Equipment used: 1 person hand held assist Transfers: Sit to/from Stand Sit to Stand: Min guard         General transfer comment: Hands-on guarding as pt powered-up to full standing position. VC's for improved posture.   Ambulation/Gait Ambulation/Gait assistance: Min guard Ambulation Distance (Feet): 20 Feet (10', seated rest, 10') Assistive device: None Gait Pattern/deviations: Step-through pattern;Decreased stride length;Trunk flexed Gait velocity: Decreased Gait velocity interpretation: Below normal speed for age/gender General Gait Details: VC's for improved posture. Pt reaching for wall in room as she walked to/from bathroom for support. Did not want therapist's hand for balance assist.  As pt did not have brace in room limited OOB mobility to bathroom.   Stairs            Wheelchair Mobility    Modified Rankin (Stroke Patients Only)       Balance                                             Pertinent Vitals/Pain Pain Assessment: Faces Faces Pain Scale: Hurts even more Pain Location: Incision site Pain Descriptors / Indicators: Operative site guarding;Discomfort Pain Intervention(s): Limited activity within patient's tolerance;Monitored during session;Repositioned    Home Living Family/patient expects to be discharged to:: Skilled nursing facility Living Arrangements: Spouse/significant other Available Help at Discharge: Family;Available 24 hours/day Type of Home: House Home Access: Stairs to enter   [employer] of Steps: 2 Home Layout: One level Home Equipment: Shower seat - built in  Prior Function Level of Independence: Independent               Hand Dominance        Extremity/Trunk Assessment   Upper Extremity Assessment Upper Extremity Assessment: Defer to OT evaluation    Lower Extremity Assessment Lower Extremity Assessment: Generalized weakness    Cervical / Trunk  Assessment Cervical / Trunk Assessment: Kyphotic;Other exceptions Cervical / Trunk Exceptions: Scoliosis  Communication   Communication: No difficulties  Cognition Arousal/Alertness: Awake/alert Behavior During Therapy: Flat affect Overall Cognitive Status: Within Functional Limits for tasks assessed                      General Comments      Exercises     Assessment/Plan    PT Assessment Patient needs continued PT services  PT Problem List Decreased strength;Decreased range of motion;Decreased activity tolerance;Decreased balance;Decreased mobility;Decreased knowledge of use of DME;Decreased safety awareness;Decreased knowledge of precautions;Pain       PT Treatment Interventions DME instruction;Gait training;Stair training;Functional mobility training;Therapeutic activities;Therapeutic exercise;Neuromuscular re-education;Patient/family education    PT Goals (Current goals can be found in the Care Plan section)  Acute Rehab PT Goals Patient Stated Goal: Home after rehab PT Goal Formulation: With patient Time For Goal Achievement: 06/17/16 Potential to Achieve Goals: Good    Frequency Min 5X/week   Barriers to discharge        Co-evaluation               End of Session   Activity Tolerance: Other (comment) (Limited by no brace available) Patient left: in bed Nurse Communication: Mobility status PT Visit Diagnosis: Other abnormalities of gait and mobility (R26.89);Pain Pain - part of body:  (Back)         Time: [id number] PT Time Calculation (min) (ACUTE ONLY): 24 min   Charges:   PT Evaluation $PT Eval Moderate Complexity: 1 Procedure PT Treatments $Gait Training: 8-22 mins   PT G Codes:         [name] 06/03/2016, 8:48 AM   Rolinda Roan, PT, DPT Acute Rehabilitation Services Pager: [phone]

## 2016-06-04 NOTE — Progress Notes (Signed)
Occupational Therapy Treatment Patient Details Name: Allison Mclean MRN: [id number] DOB: 19-May-1935 Today's Date: 06/04/2016    History of present illness Pt is a 81 y/o female who presents s/p L1-L2 ALIF and L5-S1 ALIF on 06/02/16. Pt to go for second stage of surgery possibly 06/05/16.   OT comments  Pt is making progress towards OT goals this session. Pt able to recall 3/3 back precautions and only needed min vc to maintain during ADL. OT began AE education, and reinforced bed mobility while maintaining precautions. Pt continues to benefit from skilled OT in the acute setting prior to discharge to SNF to maximize independence and safety in ADL and maintain precautions. Next session to focus on AE education and practice.   Follow Up Recommendations  SNF    Equipment Recommendations  Other (comment) (defer to next venue)    Recommendations for Other Services      Precautions / Restrictions Precautions Precautions: Fall;Back Precaution Booklet Issued: Yes (comment) Precaution Comments: Pt able to recall 3/3 precautions with no vc needed Required Braces or Orthoses: Spinal Brace Spinal Brace: Thoracolumbosacral orthotic;Applied in sitting position Restrictions Weight Bearing Restrictions: No       Mobility Bed Mobility Overal bed mobility: Needs Assistance Bed Mobility: Rolling;Sit to Sidelying Rolling: Min assist       Sit to sidelying: Min assist General bed mobility comments: min A to bring BLE into bed to maintain precautions  Transfers Overall transfer level: Needs assistance Equipment used: Rolling walker (2 wheeled) Transfers: Sit to/from Stand Sit to Stand: Supervision         General transfer comment: min vc for precautions, good hand placement    Balance Overall balance assessment: Needs assistance Sitting-balance support: Feet supported;No upper extremity supported Sitting balance-Leahy Scale: Fair     Standing balance support: No upper extremity  supported;During functional activity Standing balance-Leahy Scale: Fair                     ADL Overall ADL's : Needs assistance/impaired Eating/Feeding: Independent (but claims no appetite - OT encouraged her to eat)   Grooming: Wash/dry hands;Oral care;Brushing hair;Applying deodorant;Wash/dry face;Supervision/safety;Standing Grooming Details (indicate cue type and reason): Pt able to perform all grooming with min cues, educated in cup method for oral care with mod reinforcement, educated on setting up on right side to prevent twisting     Lower Body Bathing: Maximal assistance;Sitting/lateral leans Lower Body Bathing Details (indicate cue type and reason): Pt educated in long handle sponge from AE kit Upper Body Dressing : Minimal assistance;Sitting Upper Body Dressing Details (indicate cue type and reason): don brace educated on leaving L side hooked, and re-attaching velcro for when she would take brace on/off     Toilet Transfer: Copy Details (indicate cue type and reason): good hand placement and use of arm rests Toileting- Clothing Manipulation and Hygiene: Supervision/safety       Functional mobility during ADLs: Min guard (vc for safety with RW) General ADL Comments: husband not present for session, Pt had glasses      Vision                 Additional Comments: Pt had reading glasses for today's session   Perception     Praxis      Cognition   Behavior During Therapy: Flat affect Overall Cognitive Status: Within Functional Limits for tasks assessed  Exercises     Shoulder Instructions       General Comments      Pertinent Vitals/ Pain       Pain Assessment: 0-10 Pain Score: 5  Faces Pain Scale: Hurts even more Pain Location: Incision site Pain Descriptors / Indicators: Operative site guarding;Discomfort;Grimacing (grimacing with transfers) Pain Intervention(s): Limited  activity within patient's tolerance;Monitored during session;Repositioned  Home Living                                          Prior Functioning/Environment              Frequency  Min 2X/week        Progress Toward Goals  OT Goals(current goals can now be found in the care plan section)  Progress towards OT goals: Progressing toward goals  Acute Rehab OT Goals Patient Stated Goal: Home after rehab OT Goal Formulation: With patient Time For Goal Achievement: 06/17/16 Potential to Achieve Goals: Good  Plan Discharge plan remains appropriate    Co-evaluation                 End of Session Equipment Utilized During Treatment: Back brace;Gait belt  OT Visit Diagnosis: Other abnormalities of gait and mobility (R26.89)   Activity Tolerance Patient tolerated treatment well   Patient Left in bed;with call bell/phone within reach   Nurse Communication Mobility status;Precautions (Pt ready for enema)        Time: 3005-1102 OT Time Calculation (min): 35 min  Charges: OT General Charges $OT Visit: 1 Procedure OT Treatments $Self Care/Home Management : 23-37 mins  Hulda Humphrey OTR/L [phone]    Allison Mclean 06/04/2016, 10:40 AM

## 2016-06-04 NOTE — Progress Notes (Signed)
Physical Therapy Treatment Patient Details Name: Allison Mclean MRN: [id number] DOB: 09-Nov-1935 Today's Date: 06/04/2016    History of Present Illness Pt is a 81 y/o female who presents s/p L1-L2 ALIF and L5-S1 ALIF on 06/02/16. Pt to go for second stage of surgery possibly 06/05/16.    PT Comments    Pt progressing towards physical therapy goals. Was able to perform transfers and ambulation with close guard for safety. Pt with decreased safety awareness and was advised to wait for staff to mobilize. Will continue to follow and progress as able per POC.    Follow Up Recommendations  SNF;Supervision/Assistance - 24 hour     Equipment Recommendations  Other (comment) (TBD after next stage of surgery)    Recommendations for Other Services       Precautions / Restrictions Precautions Precautions: Fall;Back Precaution Booklet Issued: Yes (comment) Precaution Comments: reviewed back handout with patient for adsl Required Braces or Orthoses: Spinal Brace Spinal Brace: Thoracolumbosacral orthotic;Applied in sitting position Restrictions Weight Bearing Restrictions: No    Mobility  Bed Mobility               General bed mobility comments: Pt sitting EOB when PT arrived.   Transfers Overall transfer level: Needs assistance Equipment used: Rolling walker (2 wheeled) Transfers: Sit to/from Stand Sit to Stand: Min guard         General transfer comment: Hands-on guarding as pt powered-up to full standing position. VC's for improved posture.   Ambulation/Gait Ambulation/Gait assistance: Min guard Ambulation Distance (Feet): 100 Feet Assistive device: Rolling walker (2 wheeled) Gait Pattern/deviations: Step-through pattern;Decreased stride length;Trunk flexed Gait velocity: Decreased Gait velocity interpretation: Below normal speed for age/gender General Gait Details: VC's for improved posture. Pt was able to ambulate in hall ~50 feet before feeling fatigued and needing to  turn around to go back to the room.    Stairs            Wheelchair Mobility    Modified Rankin (Stroke Patients Only)       Balance Overall balance assessment: Needs assistance Sitting-balance support: Feet supported;No upper extremity supported Sitting balance-Leahy Scale: Fair     Standing balance support: No upper extremity supported;During functional activity Standing balance-Leahy Scale: Fair                      Cognition Arousal/Alertness: Awake/alert Behavior During Therapy: Flat affect Overall Cognitive Status: Within Functional Limits for tasks assessed                      Exercises      General Comments        Pertinent Vitals/Pain Pain Assessment: Faces Faces Pain Scale: Hurts even more Pain Location: Incision site Pain Descriptors / Indicators: Operative site guarding;Discomfort Pain Intervention(s): Limited activity within patient's tolerance;Monitored during session;Repositioned    Home Living                      Prior Function            PT Goals (current goals can now be found in the care plan section) Acute Rehab PT Goals Patient Stated Goal: Home after rehab PT Goal Formulation: With patient Time For Goal Achievement: 06/17/16 Potential to Achieve Goals: Good Progress towards PT goals: Progressing toward goals    Frequency    Min 5X/week      PT Plan Current plan remains appropriate    Co-evaluation  End of Session Equipment Utilized During Treatment: Back brace Activity Tolerance: Patient limited by fatigue;Patient limited by pain Patient left: in bed Nurse Communication: Mobility status PT Visit Diagnosis: Other abnormalities of gait and mobility (R26.89);Pain Pain - part of body:  (Back)     Time: [id number] PT Time Calculation (min) (ACUTE ONLY): 19 min  Charges:  $Gait Training: 8-22 mins                    G Codes:       Allison Mclean 06-20-16, 8:29 AM    Allison Mclean, PT, DPT Acute Rehabilitation Services Pager: [phone]

## 2016-06-04 NOTE — Progress Notes (Signed)
Subjective: Patient reports doing better today  Objective: Vital signs in last 24 hours: Temp:  [98.2 F (36.8 C)-100 F (37.8 C)] 98.2 F (36.8 C) (03/01 0802) Pulse Rate:  [96-100] 100 (03/01 0802) Resp:  [16-18] 18 (03/01 0802) BP: (109-147)/(56-94) 109/94 (03/01 0802) SpO2:  [94 %-100 %] 95 % (03/01 0802)  Intake/Output from previous day: 02/28 0701 - 03/01 0700 In: 480 [P.O.:480] Out: -  Intake/Output this shift: No intake/output data recorded.  Physical Exam: Strength full.  Dressings CDI.  Active BSs.  Abdomen mildly distended, but non-tender.  Lab Results: No results for input(s): WBC, HGB, HCT, PLT in the last 72 hours. BMET No results for input(s): NA, K, CL, CO2, GLUCOSE, BUN, CREATININE, CALCIUM in the last 72 hours.  Studies/Results: Dg Lumbar Spine 2-3 Views  Result Date: 06/02/2016 CLINICAL DATA:  Lumbar disc disease. EXAM: DG C-ARM 61-120 MIN; LUMBAR SPINE - 2-3 VIEW COMPARISON:  None. FINDINGS: AP and lateral C-arm images demonstrate the patient undergoing anterior fusion at L5-S1 and interbody fusion at L1-2. Hardware devices appear in good position in the AP and lateral projections. IMPRESSION: Anterior interbody fusion performed at L5-S1. Interbody fusion performed at L1-2. Electronically Signed   By: Lorriane Shire M.D.   On: 06/02/2016 12:03   Dg C-arm 61-120 Min  Result Date: 06/02/2016 CLINICAL DATA:  Lumbar disc disease. EXAM: DG C-ARM 61-120 MIN; LUMBAR SPINE - 2-3 VIEW COMPARISON:  None. FINDINGS: AP and lateral C-arm images demonstrate the patient undergoing anterior fusion at L5-S1 and interbody fusion at L1-2. Hardware devices appear in good position in the AP and lateral projections. IMPRESSION: Anterior interbody fusion performed at L5-S1. Interbody fusion performed at L1-2. Electronically Signed   By: Lorriane Shire M.D.   On: 06/02/2016 12:03   Dg Scoliosis Eval Complete Spine 2 Or 3 Views  Result Date: 06/03/2016 CLINICAL DATA:   Scoliosis/kyphosis status post surgery. EXAM: DG SCOLIOSIS EVAL COMPLETE SPINE 2-3V COMPARISON:  None. FINDINGS: Full-length PA radiograph of the spine is provided. There is mild levocurvature of the lumbar spine measuring 5 degrees. There is no other significant curvature greater than 5 degrees. There are no intrinsic vertebral anomalies. No significant pelvic tilt. Risser grade is 5. Both hips appear normal. Soft tissues are unremarkable. Posterior spinal fusion from L3 through L5. Anterior interbody fusion at L5-S1. Interbody disc replacement at L2-3. IMPRESSION: Mild levocurvature of the lumbar spine measuring 5 degrees. Electronically Signed   By: Kathreen Devoid   On: 06/03/2016 11:46    Assessment/Plan: Patient mobilizing in brace.  Good initial correction with ALIF and XLIF (SVA decreased by 30 mm).  Will work on bowels.  Posterior surgery tomorrow.    LOS: 2 days    Peggyann Shoals, MD 06/04/2016, 10:11 AM

## 2016-06-05 ENCOUNTER — Encounter
Admission: RE | Admit: 2016-06-05 | Discharge: 2016-06-05 | Disposition: A | Discharge status: Home / Self Care | Payer: Medicare Other | Source: Ambulatory Visit | Attending: Internal Medicine | Admitting: Internal Medicine

## 2016-06-05 ENCOUNTER — Inpatient Hospital Stay (HOSPITAL_COMMUNITY): Payer: Medicare Other | Admitting: Anesthesiology

## 2016-06-05 ENCOUNTER — Encounter (HOSPITAL_COMMUNITY)
Admission: RE | Disposition: A | Discharge status: Home / Self Care | Payer: Self-pay | Source: Ambulatory Visit | Attending: Neurosurgery

## 2016-06-05 ENCOUNTER — Inpatient Hospital Stay (HOSPITAL_COMMUNITY): Payer: Medicare Other

## 2016-06-05 ENCOUNTER — Encounter (HOSPITAL_COMMUNITY): Payer: Self-pay | Admitting: Certified Registered Nurse Anesthetist

## 2016-06-05 ENCOUNTER — Inpatient Hospital Stay (HOSPITAL_COMMUNITY): Admission: RE | Admit: 2016-06-05 | Payer: Medicare Other | Source: Ambulatory Visit | Admitting: Neurosurgery

## 2016-06-05 DIAGNOSIS — R410 Disorientation, unspecified: Secondary | ICD-10-CM | POA: Insufficient documentation

## 2016-06-05 DIAGNOSIS — R32 Unspecified urinary incontinence: Secondary | ICD-10-CM | POA: Insufficient documentation

## 2016-06-05 HISTORY — PX: APPLICATION OF INTRAOPERATIVE CT SCAN: SHX6668

## 2016-06-05 HISTORY — PX: POSTERIOR LUMBAR FUSION 4 LEVEL: SHX6037

## 2016-06-05 SURGERY — POSTERIOR LUMBAR FUSION 4 LEVEL
Anesthesia: General | Site: Back

## 2016-06-05 MED ORDER — ESMOLOL HCL 100 MG/10ML IV SOLN
INTRAVENOUS | Status: DC | PRN
  Administered 2016-06-05 (×2): 10 mg via INTRAVENOUS

## 2016-06-05 MED ORDER — ACETAMINOPHEN 10 MG/ML IV SOLN
INTRAVENOUS | Status: DC | PRN
  Administered 2016-06-05: 1000 mg via INTRAVENOUS

## 2016-06-05 MED ORDER — THROMBIN 20000 UNITS EX SOLR
CUTANEOUS | Status: DC | PRN
  Administered 2016-06-05: 09:00:00 via TOPICAL

## 2016-06-05 MED ORDER — ACETAMINOPHEN 10 MG/ML IV SOLN
INTRAVENOUS | Status: AC
  Filled 2016-06-05: qty 100

## 2016-06-05 MED ORDER — LIDOCAINE-EPINEPHRINE 1 %-1:100000 IJ SOLN
INTRAMUSCULAR | Status: DC | PRN
  Administered 2016-06-05: 30 mL

## 2016-06-05 MED ORDER — VANCOMYCIN HCL 1000 MG IV SOLR
INTRAVENOUS | Status: DC | PRN
  Administered 2016-06-05: 1000 mg

## 2016-06-05 MED ORDER — PHENYLEPHRINE HCL 10 MG/ML IJ SOLN
INTRAMUSCULAR | Status: DC | PRN
  Administered 2016-06-05: 10 ug/min via INTRAVENOUS

## 2016-06-05 MED ORDER — LABETALOL HCL 5 MG/ML IV SOLN
INTRAVENOUS | Status: DC | PRN
  Administered 2016-06-05: 5 mg via INTRAVENOUS

## 2016-06-05 MED ORDER — LACTATED RINGERS IV SOLN
INTRAVENOUS | Status: DC | PRN
  Administered 2016-06-05: 10:00:00 via INTRAVENOUS

## 2016-06-05 MED ORDER — 0.9 % SODIUM CHLORIDE (POUR BTL) OPTIME
TOPICAL | Status: DC | PRN
  Administered 2016-06-05: 1000 mL

## 2016-06-05 MED ORDER — HYDROMORPHONE HCL 1 MG/ML IJ SOLN
INTRAMUSCULAR | Status: DC | PRN
  Administered 2016-06-05 (×2): 0.5 mg via INTRAVENOUS

## 2016-06-05 MED ORDER — SODIUM CHLORIDE 0.9 % IJ SOLN
INTRAMUSCULAR | Status: DC | PRN
  Administered 2016-06-05 (×2): 10 mL via INTRAVENOUS

## 2016-06-05 MED ORDER — NEOSTIGMINE METHYLSULFATE 5 MG/5ML IV SOSY
PREFILLED_SYRINGE | INTRAVENOUS | Status: DC | PRN
  Administered 2016-06-05: 2 mg via INTRAVENOUS

## 2016-06-05 MED ORDER — PHENYLEPHRINE HCL 10 MG/ML IJ SOLN
INTRAMUSCULAR | Status: DC | PRN
  Administered 2016-06-05 (×2): 80 ug via INTRAVENOUS
  Administered 2016-06-05 (×2): 40 ug via INTRAVENOUS

## 2016-06-05 MED ORDER — MIDAZOLAM HCL 5 MG/5ML IJ SOLN
INTRAMUSCULAR | Status: DC | PRN
  Administered 2016-06-05 (×2): 1 mg via INTRAVENOUS

## 2016-06-05 MED ORDER — ROCURONIUM BROMIDE 10 MG/ML (PF) SYRINGE
PREFILLED_SYRINGE | INTRAVENOUS | Status: DC | PRN
  Administered 2016-06-05 (×3): 50 mg via INTRAVENOUS

## 2016-06-05 MED ORDER — DEXAMETHASONE SODIUM PHOSPHATE 10 MG/ML IJ SOLN
INTRAMUSCULAR | Status: DC | PRN
  Administered 2016-06-05: 10 mg via INTRAVENOUS

## 2016-06-05 MED ORDER — KETAMINE HCL 10 MG/ML IJ SOLN
INTRAMUSCULAR | Status: DC | PRN
  Administered 2016-06-05 (×3): 10 mg via INTRAVENOUS
  Administered 2016-06-05: 40 mg via INTRAVENOUS

## 2016-06-05 MED ORDER — LACTATED RINGERS IV SOLN
INTRAVENOUS | Status: DC
  Administered 2016-06-05 (×2): via INTRAVENOUS

## 2016-06-05 MED ORDER — HEMOSTATIC AGENTS (NO CHARGE) OPTIME
TOPICAL | Status: DC | PRN
  Administered 2016-06-05: 1 via TOPICAL

## 2016-06-05 MED ORDER — FENTANYL CITRATE (PF) 100 MCG/2ML IJ SOLN
INTRAMUSCULAR | Status: DC | PRN
  Administered 2016-06-05: 100 ug via INTRAVENOUS
  Administered 2016-06-05 (×2): 50 ug via INTRAVENOUS
  Administered 2016-06-05: 150 ug via INTRAVENOUS
  Administered 2016-06-05: 100 ug via INTRAVENOUS
  Administered 2016-06-05 (×3): 50 ug via INTRAVENOUS
  Administered 2016-06-05 (×2): 100 ug via INTRAVENOUS
  Administered 2016-06-05: 50 ug via INTRAVENOUS
  Administered 2016-06-05: 100 ug via INTRAVENOUS
  Administered 2016-06-05: 50 ug via INTRAVENOUS
  Administered 2016-06-05: 100 ug via INTRAVENOUS

## 2016-06-05 MED ORDER — TRANEXAMIC ACID 1000 MG/10ML IV SOLN: 1000.0000 mg | INTRAVENOUS | Status: DC

## 2016-06-05 MED ORDER — THROMBIN 5000 UNITS EX SOLR
CUTANEOUS | Status: DC | PRN
  Administered 2016-06-05: 09:00:00 via TOPICAL

## 2016-06-05 MED ORDER — PROPOFOL 10 MG/ML IV BOLUS
INTRAVENOUS | Status: DC | PRN
  Administered 2016-06-05: 20 mg via INTRAVENOUS
  Administered 2016-06-05: 40 mg via INTRAVENOUS
  Administered 2016-06-05: 140 mg via INTRAVENOUS

## 2016-06-05 MED ORDER — OXYCODONE HCL 5 MG/5ML PO SOLN: 5.0000 mg | Freq: Once | ORAL | Status: DC | PRN

## 2016-06-05 MED ORDER — BUPIVACAINE LIPOSOME 1.3 % IJ SUSP
20.0000 mL | INTRAMUSCULAR | Status: AC
  Filled 2016-06-05: qty 20

## 2016-06-05 MED ORDER — CEFAZOLIN SODIUM-DEXTROSE 2-4 GM/100ML-% IV SOLN
2.0000 g | INTRAVENOUS | Status: AC
  Administered 2016-06-05: 2 g via INTRAVENOUS

## 2016-06-05 MED ORDER — CHLORHEXIDINE GLUCONATE CLOTH 2 % EX PADS: 6.0000 | MEDICATED_PAD | Freq: Once | CUTANEOUS | Status: DC

## 2016-06-05 MED ORDER — BUPIVACAINE LIPOSOME 1.3 % IJ SUSP
INTRAMUSCULAR | Status: DC | PRN
  Administered 2016-06-05: 20 mL

## 2016-06-05 MED ORDER — GLYCOPYRROLATE 0.2 MG/ML IV SOSY
PREFILLED_SYRINGE | INTRAVENOUS | Status: DC | PRN
  Administered 2016-06-05: .2 mg via INTRAVENOUS

## 2016-06-05 MED ORDER — CEFAZOLIN SODIUM-DEXTROSE 2-4 GM/100ML-% IV SOLN
INTRAVENOUS | Status: AC
  Filled 2016-06-05: qty 100

## 2016-06-05 MED ORDER — FENTANYL CITRATE (PF) 100 MCG/2ML IJ SOLN: 25.0000 ug | INTRAMUSCULAR | Status: DC | PRN

## 2016-06-05 MED ORDER — BUPIVACAINE HCL (PF) 0.5 % IJ SOLN
INTRAMUSCULAR | Status: DC | PRN
  Administered 2016-06-05: 30 mL

## 2016-06-05 MED ORDER — CEFAZOLIN SODIUM 1 G IJ SOLR
INTRAMUSCULAR | Status: DC | PRN
  Administered 2016-06-05: 2 g via INTRAMUSCULAR

## 2016-06-05 MED ORDER — ONDANSETRON HCL 4 MG/2ML IJ SOLN: 4.0000 mg | Freq: Once | INTRAMUSCULAR | Status: DC | PRN

## 2016-06-05 MED ORDER — OXYCODONE HCL 5 MG PO TABS: 5.0000 mg | ORAL_TABLET | Freq: Once | ORAL | Status: DC | PRN

## 2016-06-05 MED ORDER — SODIUM CHLORIDE 0.9 % IV SOLN
1000.0000 mg | INTRAVENOUS | Status: AC
  Administered 2016-06-05: 1000 mg via INTRAVENOUS
  Filled 2016-06-05: qty 10

## 2016-06-05 SURGICAL SUPPLY — 89 items
BASKET BONE COLLECTION (BASKET) ×3 IMPLANT
BLADE CLIPPER SURG (BLADE) IMPLANT
BONE CANC CHIPS 40CC CAN1/2 (Bone Implant) ×16 IMPLANT
BUR MATCHSTICK NEURO 3.0 LAGG (BURR) ×4 IMPLANT
BUR PRECISION FLUTE 5.0 (BURR) ×4 IMPLANT
CANISTER SUCT 3000ML PPV (MISCELLANEOUS) ×4 IMPLANT
CARTRIDGE OIL MAESTRO DRILL (MISCELLANEOUS) ×2 IMPLANT
CHIPS CANC BONE 40CC CAN1/2 (Bone Implant) ×8 IMPLANT
CONNECTOR RELINE 30MM OPEN OFF (Connector) ×8 IMPLANT
CONT SPEC 4OZ CLIKSEAL STRL BL (MISCELLANEOUS) ×4 IMPLANT
COVER BACK TABLE 60X90IN (DRAPES) ×4 IMPLANT
DECANTER SPIKE VIAL GLASS SM (MISCELLANEOUS) ×4 IMPLANT
DERMABOND ADVANCED (GAUZE/BANDAGES/DRESSINGS) ×2
DERMABOND ADVANCED .7 DNX12 (GAUZE/BANDAGES/DRESSINGS) ×2 IMPLANT
DIFFUSER DRILL AIR PNEUMATIC (MISCELLANEOUS) ×4 IMPLANT
DIGITIZER BENDINI (MISCELLANEOUS) ×8 IMPLANT
DRAPE C-ARM 42X72 X-RAY (DRAPES) IMPLANT
DRAPE C-ARMOR (DRAPES) IMPLANT
DRAPE LAPAROTOMY 100X72X124 (DRAPES) ×4 IMPLANT
DRAPE POUCH INSTRU U-SHP 10X18 (DRAPES) ×4 IMPLANT
DRAPE SCAN PATIENT (DRAPES) ×16 IMPLANT
DRAPE SURG 17X23 STRL (DRAPES) ×4 IMPLANT
DRSG OPSITE POSTOP 4X10 (GAUZE/BANDAGES/DRESSINGS) ×8 IMPLANT
DURAPREP 26ML APPLICATOR (WOUND CARE) ×4 IMPLANT
ELECT REM PT RETURN 9FT ADLT (ELECTROSURGICAL) ×4
ELECTRODE REM PT RTRN 9FT ADLT (ELECTROSURGICAL) ×2 IMPLANT
EVACUATOR 1/8 PVC DRAIN (DRAIN) ×4 IMPLANT
GAUZE SPONGE 4X4 12PLY STRL (GAUZE/BANDAGES/DRESSINGS) ×4 IMPLANT
GAUZE SPONGE 4X4 16PLY XRAY LF (GAUZE/BANDAGES/DRESSINGS) IMPLANT
GLOVE BIO SURGEON STRL SZ7 (GLOVE) ×4 IMPLANT
GLOVE BIO SURGEON STRL SZ8 (GLOVE) ×16 IMPLANT
GLOVE BIOGEL PI IND STRL 7.5 (GLOVE) ×2 IMPLANT
GLOVE BIOGEL PI IND STRL 8 (GLOVE) ×10 IMPLANT
GLOVE BIOGEL PI IND STRL 8.5 (GLOVE) ×4 IMPLANT
GLOVE BIOGEL PI INDICATOR 7.5 (GLOVE) ×2
GLOVE BIOGEL PI INDICATOR 8 (GLOVE) ×10
GLOVE BIOGEL PI INDICATOR 8.5 (GLOVE) ×4
GLOVE ECLIPSE 8.0 STRL XLNG CF (GLOVE) ×8 IMPLANT
GLOVE INDICATOR 7.5 STRL GRN (GLOVE) ×16 IMPLANT
GLOVE SURG SS PI 7.0 STRL IVOR (GLOVE) ×20 IMPLANT
GOWN STRL REUS W/ TWL LRG LVL3 (GOWN DISPOSABLE) ×2 IMPLANT
GOWN STRL REUS W/ TWL XL LVL3 (GOWN DISPOSABLE) ×10 IMPLANT
GOWN STRL REUS W/TWL 2XL LVL3 (GOWN DISPOSABLE) ×12 IMPLANT
GOWN STRL REUS W/TWL LRG LVL3 (GOWN DISPOSABLE) ×2
GOWN STRL REUS W/TWL XL LVL3 (GOWN DISPOSABLE) ×10
HEMOSTAT POWDER SURGIFOAM 1G (HEMOSTASIS) ×4 IMPLANT
KIT BASIN OR (CUSTOM PROCEDURE TRAY) ×4 IMPLANT
KIT POSITION SURG JACKSON T1 (MISCELLANEOUS) ×4 IMPLANT
KIT ROOM TURNOVER OR (KITS) ×4 IMPLANT
MARKER SPHERE PSV REFLC 13MM (MARKER) ×24 IMPLANT
MILL MEDIUM DISP (BLADE) ×4 IMPLANT
MODULE POWER NUVASIVE (MISCELLANEOUS) ×2 IMPLANT
NEEDLE HYPO 25X1 1.5 SAFETY (NEEDLE) ×4 IMPLANT
NEEDLE SPNL 18GX3.5 QUINCKE PK (NEEDLE) IMPLANT
NS IRRIG 1000ML POUR BTL (IV SOLUTION) ×4 IMPLANT
OIL CARTRIDGE MAESTRO DRILL (MISCELLANEOUS) ×4
PACK LAMINECTOMY NEURO (CUSTOM PROCEDURE TRAY) ×4 IMPLANT
PAD ARMBOARD 7.5X6 YLW CONV (MISCELLANEOUS) ×12 IMPLANT
PATTIES SURGICAL .5 X.5 (GAUZE/BANDAGES/DRESSINGS) IMPLANT
PATTIES SURGICAL .5 X1 (DISPOSABLE) IMPLANT
PATTIES SURGICAL 1X1 (DISPOSABLE) IMPLANT
POWER MODULE NUVASIVE (MISCELLANEOUS) ×4
REMOTE CONTROL CLIP ON DISP (MISCELLANEOUS) ×3 IMPLANT
ROD RELINE 5.5X500MM STRAIGHT (Rod) ×8 IMPLANT
SCREW LOCK RELINE 5.5 TULIP (Screw) ×108 IMPLANT
SCREW LOCK RELINE CLOSED TULIP (Screw) ×8 IMPLANT
SCREW RELINE 5.5X35 POLYAXIAL (Screw) ×12 IMPLANT
SCREW RELINE 6.5X35 POLYAXIAL (Screw) ×8 IMPLANT
SCREW RELINE-O 7.5X35MM POLY (Screw) ×8 IMPLANT
SCREW RELINE-O 8.5X60MM CLOSED (Screw) ×8 IMPLANT
SCREW RELINE-O POLY 5.5X40 (Screw) ×12 IMPLANT
SCREW RELINE-O POLY 5.5X45MM (Screw) ×16 IMPLANT
SCREW RELINE-O POLY 6.5X40 (Screw) ×4 IMPLANT
SCREW RELINE-O POLY 6.5X45 (Screw) ×20 IMPLANT
SCREW RELINE-O POLY 7.5X40 (Screw) ×12 IMPLANT
SCREW RELINE-O POLY 7.5X45 (Screw) ×8 IMPLANT
SEALANT ADHERUS EXTEND TIP (MISCELLANEOUS) ×4 IMPLANT
SPONGE LAP 4X18 X RAY DECT (DISPOSABLE) ×8 IMPLANT
SPONGE SURGIFOAM ABS GEL 100 (HEMOSTASIS) ×4 IMPLANT
STAPLER SKIN PROX WIDE 3.9 (STAPLE) ×4 IMPLANT
SUT VIC AB 1 CT1 18XBRD ANBCTR (SUTURE) ×10 IMPLANT
SUT VIC AB 1 CT1 8-18 (SUTURE) ×10
SUT VIC AB 2-0 CT1 18 (SUTURE) ×24 IMPLANT
SUT VIC AB 3-0 SH 8-18 (SUTURE) ×20 IMPLANT
SYR 5ML LL (SYRINGE) IMPLANT
TOWEL GREEN STERILE (TOWEL DISPOSABLE) ×3 IMPLANT
TOWEL GREEN STERILE FF (TOWEL DISPOSABLE) ×3 IMPLANT
TRAY FOLEY W/METER SILVER 16FR (SET/KITS/TRAYS/PACK) ×4 IMPLANT
WATER STERILE IRR 1000ML POUR (IV SOLUTION) ×4 IMPLANT

## 2016-06-05 NOTE — Clinical Social Work Placement (Signed)
   CLINICAL SOCIAL WORK PLACEMENT  NOTE  Date:  06/05/2016  Patient Details  Name: Allison Mclean MRN: [id number] Date of Birth: 10/31/1935  Clinical Social Work is seeking post-discharge placement for this patient at the [place] level of care (*CSW will initial, date and re-position this form in  chart as items are completed):  Yes   Patient/family provided with [facility] Work Department's list of facilities offering this level of care within the geographic area requested by the patient (or if unable, by the patient's family).  Yes   Patient/family informed of their freedom to choose among providers that offer the needed level of care, that participate in Medicare, Medicaid or managed care program needed by the patient, have an available bed and are willing to accept the patient.  Yes   Patient/family informed of [facility]'s ownership interest in [facility] and [facility], as well as of the fact that they are under no obligation to receive care at these facilities.  PASRR submitted to EDS on       PASRR number received on       Existing PASRR number confirmed on 06/05/16     FL2 transmitted to all facilities in geographic area requested by pt/family on 06/05/16     FL2 transmitted to all facilities within larger geographic area on       Patient informed that his/her managed care company has contracts with or will negotiate with certain facilities, including the following:        Yes   Patient/family informed of bed offers received.  Patient chooses bed at [facility]     Physician recommends and patient chooses bed at      Patient to be transferred to [facility] on  .  Patient to be transferred to facility by       Patient family notified on   of transfer.  Name of family member notified:        PHYSICIAN Please prepare priority discharge summary, including medications, Please prepare prescriptions, Please sign FL2      Additional Comment:  CSW has confirmed bed availability for the patient at [facility]. [facility] states that they can accommodate weekend admission for the patient if she is medically stable.  _______________________________________________ Rigoberto Noel, LCSW 06/05/2016, 11:54 AM

## 2016-06-05 NOTE — Progress Notes (Signed)
Patient admitted from PACU. Patient drowsy but easy to arouse. Patient oriented to room and made comfortable. Incision dressings clean and intact. Will continue to monitor.

## 2016-06-05 NOTE — Anesthesia Postprocedure Evaluation (Addendum)
Anesthesia Post Note  Patient: Allison Mclean  Procedure(s) Performed: Procedure(s) (LRB): Thoracic six to pelvis fixation with Smith-Peterson Thoracic osteotomies at Thoracic ten-Thoracic Twelve with AIRO, Removal hardware lumbar two to lumbar five (N/A) APPLICATION OF INTRAOPERATIVE CT SCAN (N/A)  Patient location during evaluation: PACU Anesthesia Type: General Level of consciousness: awake and oriented Pain management: pain level controlled Vital Signs Assessment: post-procedure vital signs reviewed and stable Respiratory status: spontaneous breathing, nonlabored ventilation, respiratory function stable and patient connected to nasal cannula oxygen Cardiovascular status: blood pressure returned to baseline Anesthetic complications: no       Last Vitals:  Vitals:   06/05/16 1640 06/05/16 1643  BP:  135/62  Pulse:  92  Resp:  10  Temp: 36.2 C     Last Pain:  Vitals:   06/05/16 1640  TempSrc:   PainSc: 0-No pain                 Jamaree Hosier COKER

## 2016-06-05 NOTE — NC FL2 (Signed)
[place] LEVEL OF CARE SCREENING TOOL     IDENTIFICATION  Patient Name: Allison Mclean Birthdate: 02/11/1936 Sex: female Admission Date (Current Location): 06/02/2016  [facility] and [place] Number:  Herbalist and Address:  [facility], [place] [address] [place], [place] [address]      Provider Number: [id number]  Attending Physician Name and Address:  Erline Levine, MD  Relative Name and Phone Number:       Current Level of Care: Hospital Recommended Level of Care: [place] Prior Approval Number:    Date Approved/Denied:   PASRR Number: [id number]  Discharge Plan: SNF    Current Diagnoses: Patient Active Problem List   Diagnosis Date Noted  . Kyphoscoliosis and scoliosis 06/02/2016  . Obstruction of duodenum   . Abnormal findings-gastrointestinal tract   . Stricture, duodenum   . Musculoskeletal pain 08/05/2015  . Neuropathic pain 08/05/2015  . Hypomagnesemia 07/10/2015  . Thoracic spondylosis with radiculopathy (Left) 07/09/2015  . Chronic pain 07/02/2015  . Long term current use of opiate analgesic 07/02/2015  . Long term prescription opiate use 07/02/2015  . Opiate use (10 MME/day) 07/02/2015  . Encounter for therapeutic drug level monitoring 07/02/2015  . Encounter for pain management planning 07/02/2015  . Osteopenia, senile 07/02/2015  . Abnormal MRI, thoracic spine 07/02/2015  . Thoracic central spinal stenosis (T7-8, T8-9, T9-10, T10-11, T11-12, and T12-L1) 07/02/2015  . Thoracic foraminal stenosis (Moderate to severe at: Right T5; Bilateral T9; Left T11) 07/02/2015  . Chronic thoracic radicular pain (T5/T6) (Left) 07/02/2015  . History of lumbar fusion (12/19/2013) (posterior lumbar fusion from L2-L5) 07/02/2015  . Failed back surgical syndrome 07/02/2015  . Thoracic facet syndrome (Left) 07/02/2015  . History of fusion of cervical spine 07/02/2015  . Postoperative back pain 07/02/2015  .  Nerve root pain 07/02/2015  . Spinal stenosis of thoracic region 07/02/2015  . Status post lumbar spine operation 07/02/2015  . Atypical chest pain 02/05/2015  . Cough 11/26/2014  . Acid reflux 11/22/2014  . 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 Essential (primary) hypertension 12/23/2013  . Acquired spondylolisthesis 12/19/2013  . HLD (hyperlipidemia) 11/07/2013  . Supraventricular tachycardia ([place]) 11/07/2013  . BP (high blood pressure) 11/07/2013    Orientation RESPIRATION BLADDER Height & Weight     Self, Time, Situation, Place  Normal Continent Weight:   Height:     BEHAVIORAL SYMPTOMS/MOOD NEUROLOGICAL BOWEL NUTRITION STATUS   (NONE)  (NONE) Continent Diet (Regular)  AMBULATORY STATUS COMMUNICATION OF NEEDS Skin   Limited Assist Verbally Surgical wounds (Incisions throat, abdomen, left flank)                       Personal Care Assistance Level of Assistance  Bathing, Feeding, Dressing Bathing Assistance: Limited assistance Feeding assistance: Independent Dressing Assistance: Limited assistance     Functional Limitations Info  Hearing, Sight, Speech Sight Info: Adequate Hearing Info: Adequate Speech Info: Adequate    SPECIAL CARE FACTORS FREQUENCY  PT (By licensed PT), OT (By licensed OT)     PT Frequency: 5/week OT Frequency: 5/week            Contractures Contractures Info: Not present    Additional Factors Info  Code Status, Allergies Code Status Info: Full Code Allergies Info: Atorvastatin, Rosuvastatin           Current Medications (06/05/2016):  This is the current hospital active medication list Current Facility-Administered Medications  Medication Dose Route Frequency  Provider Last Rate Last Dose  . 0.9 %  sodium chloride infusion  250 mL Intravenous Continuous Erline Levine, MD   Stopped at 06/03/16 0300  . 0.9 % irrigation (POUR BTL)    PRN Erline Levine, MD   1,000 mL at 06/05/16 0908  . [MAR Hold] acetaminophen (TYLENOL) tablet 650 mg  650  mg Oral Q4H PRN Erline Levine, MD       Or  . Doug Sou Hold] acetaminophen (TYLENOL) suppository 650 mg  650 mg Rectal Q4H PRN Erline Levine, MD      . Doug Sou Hold] acetaminophen (TYLENOL) tablet 650 mg  650 mg Oral Q6H PRN Erline Levine, MD       Or  . Doug Sou Hold] acetaminophen (TYLENOL) suppository 650 mg  650 mg Rectal Q6H PRN Erline Levine, MD      . Doug Sou Hold] alum & mag hydroxide-simeth (MAALOX/MYLANTA) 200-200-20 MG/5ML suspension 30 mL  30 mL Oral Q6H PRN Erline Levine, MD      . Doug Sou Hold] bisacodyl (DULCOLAX) suppository 10 mg  10 mg Rectal Daily PRN Erline Levine, MD   10 mg at 06/03/16 1021  . bupivacaine (MARCAINE) 0.5 % injection    PRN Erline Levine, MD   30 mL at 06/05/16 1029  . bupivacaine liposome (EXPAREL) 1.3 % injection 266 mg  20 mL Infiltration To OR Erline Levine, MD      . bupivacaine liposome (EXPAREL) 1.3 % injection    PRN Erline Levine, MD   20 mL at 06/05/16 1137  . [MAR Hold] calcium-vitamin D (OSCAL WITH D) 500-200 MG-UNIT per tablet 1 tablet  1 tablet Oral Q breakfast Erline Levine, MD   1 tablet at 06/04/16 0940  . Chlorhexidine Gluconate Cloth 2 % PADS 6 each  6 each Topical Once Erline Levine, MD       And  . Chlorhexidine Gluconate Cloth 2 % PADS 6 each  6 each Topical Once Erline Levine, MD      . Doug Sou Hold] clobetasol ointment (TEMOVATE) 0.05 %   Topical BID PRN Erline Levine, MD      . dextrose 5 % and 0.45 % NaCl with KCl 20 mEq/L infusion   Intravenous Continuous Erline Levine, MD 75 mL/hr at 06/02/16 1238    . [MAR Hold] docusate sodium (COLACE) capsule 100 mg  100 mg Oral BID Erline Levine, MD   100 mg at 06/04/16 2109  . [MAR Hold] estradiol (ESTRACE) tablet 1 mg  1 mg Oral Daily Erline Levine, MD   1 mg at 06/04/16 0940  . [MAR Hold] fluticasone (FLONASE) 50 MCG/ACT nasal spray 2 spray  2 spray Each Nare QHS PRN Erline Levine, MD      . Doug Sou Hold] hydrochlorothiazide (MICROZIDE) capsule 12.5 mg  12.5 mg Oral Daily Erline Levine, MD   12.5 mg at 06/03/16 1021  . [MAR Hold]  HYDROmorphone (DILAUDID) injection 1 mg  1 mg Intravenous Q2H PRN Erline Levine, MD   1 mg at 06/03/16 0120  . [MAR Hold] irbesartan (AVAPRO) tablet 150 mg  150 mg Oral Daily Erline Levine, MD   150 mg at 06/03/16 1022  . lactated ringers infusion   Intravenous Continuous Roberts Gaudy, MD      . lidocaine-EPINEPHrine (XYLOCAINE W/EPI) 1 %-1:100000 (with pres) injection    PRN Erline Levine, MD   30 mL at 06/05/16 1029  . [MAR Hold] loratadine (CLARITIN) tablet 10 mg  10 mg Oral QHS Erline Levine, MD   10 mg at 06/04/16  2109  . [MAR Hold] menthol-cetylpyridinium (CEPACOL) lozenge 3 mg  1 lozenge Oral PRN Erline Levine, MD       Or  . Doug Sou Hold] phenol (CHLORASEPTIC) mouth spray 1 spray  1 spray Mouth/Throat PRN Erline Levine, MD      . Doug Sou Hold] methocarbamol (ROBAXIN) tablet 500 mg  500 mg Oral Q6H PRN Erline Levine, MD   500 mg at 06/02/16 1252   Or  . [MAR Hold] methocarbamol (ROBAXIN) 500 mg in dextrose 5 % 50 mL IVPB  500 mg Intravenous Q6H PRN Erline Levine, MD      . Doug Sou Hold] metoprolol succinate (TOPROL-XL) 24 hr tablet 50 mg  50 mg Oral Daily Erline Levine, MD   50 mg at 06/03/16 1021  . [MAR Hold] multivitamin (PROSIGHT) tablet   Oral Daily Erline Levine, MD   1 tablet at 06/04/16 0940  . [MAR Hold] nystatin (MYCOSTATIN/NYSTOP) topical powder 1 g  1 g Topical Daily Erline Levine, MD   1 g at 06/02/16 1900  . [MAR Hold] ondansetron (ZOFRAN) injection 4 mg  4 mg Intravenous Q4H PRN Erline Levine, MD      . Doug Sou Hold] oxyCODONE (Oxy IR/ROXICODONE) immediate release tablet 5-10 mg  5-10 mg Oral Q3H PRN Erline Levine, MD   10 mg at 06/03/16 2151  . [MAR Hold] pantoprazole (PROTONIX) EC tablet 40 mg  40 mg Oral Daily Erline Levine, MD   40 mg at 06/04/16 0940  . [MAR Hold] polyethylene glycol (MIRALAX / GLYCOLAX) packet 17 g  17 g Oral Daily PRN Erline Levine, MD   17 g at 06/04/16 0512  . [MAR Hold] pravastatin (PRAVACHOL) tablet 80 mg  80 mg Oral QHS Erline Levine, MD   80 mg at 06/04/16 2109  . [MAR  Hold] senna (SENOKOT) tablet 17.2 mg  2 tablet Oral QHS PRN Erline Levine, MD   17.2 mg at 06/03/16 2134  . [MAR Hold] senna-docusate (Senokot-S) tablet 1 tablet  1 tablet Oral Daily PRN Erline Levine, MD      . sodium chloride 0.9 % injection    PRN Erline Levine, MD   10 mL at 06/05/16 1132  . [MAR Hold] sodium chloride flush (NS) 0.9 % injection 3 mL  3 mL Intravenous Q12H Erline Levine, MD   3 mL at 06/04/16 2200  . [MAR Hold] sodium chloride flush (NS) 0.9 % injection 3 mL  3 mL Intravenous PRN Erline Levine, MD      . Surgifoam 1 Gm with Thrombin 5,000 units (5 ml) topical solution    PRN Erline Levine, MD      . Surgifoam 100 with Thrombin 20,000 units (20 ml) topical solution    PRN Erline Levine, MD      . vancomycin [facility]) powder    PRN Erline Levine, MD   1,000 mg at 06/05/16 1133  . [MAR Hold] zolpidem (AMBIEN) tablet 5 mg  5 mg Oral QHS PRN Erline Levine, MD       Facility-Administered Medications Ordered in Other Encounters  Medication Dose Route Frequency Provider Last Rate Last Dose  . dexamethasone (DECADRON) injection    Anesthesia Intra-op Verdie Drown, CRNA   10 mg at 06/05/16 1051  . esmolol (BREVIBLOC) injection    Anesthesia Intra-op Verdie Drown, CRNA   10 mg at 06/05/16 1140  . fentaNYL (SUBLIMAZE) injection    Anesthesia Intra-op Verdie Drown, CRNA   50 mcg at 06/05/16 1134  . lactated ringers infusion  Continuous PRN Verdie Drown, CRNA      . midazolam (VERSED) 5 MG/5ML injection    Anesthesia Intra-op Verdie Drown, CRNA   1 mg at 06/05/16 [id number]  . phenylephrine (NEO-SYNEPHRINE) 0.04 mg/mL in dextrose 5 % 250 mL infusion    Continuous PRN Verdie Drown, CRNA   Stopped at 06/05/16 1125  . phenylephrine (NEO-SYNEPHRINE) injection    Anesthesia Intra-op Verdie Drown, CRNA   40 mcg at 06/05/16 1015  . propofol (DIPRIVAN) 10 mg/mL bolus/IV push    Anesthesia Intra-op Verdie Drown, CRNA   20 mg at 06/05/16 1054  . rocuronium bromide 10 mg/mL  (PF) syringe   Intravenous Anesthesia Intra-op Verdie Drown, CRNA   50 mg at 06/05/16 1056     Discharge Medications: Please see discharge summary for a list of discharge medications.  Relevant Imaging Results:  Relevant Lab Results:   Additional Information SSN: [id number]  Rigoberto Noel, LCSW

## 2016-06-05 NOTE — Brief Op Note (Signed)
06/02/2016 - 06/05/2016  4:14 PM  PATIENT:  Allison Mclean  81 y.o. female  PRE-OPERATIVE DIAGNOSIS:  Kyphoscoliosis and scoliosis   POST-OPERATIVE DIAGNOSIS:  Kyphoscoliosis and scoliosis  PROCEDURE:  Procedure(s): Thoracic six to pelvis fixation with Smith-Peterson Thoracic osteotomies at Thoracic eleven to Thoracic Twelve with AIRO, Removal hardware lumbar two to lumbar five (N/A) with posterolateral arthrodesis with local autograft and allograft   APPLICATION OF INTRAOPERATIVE CT SCAN (N/A)  SURGEON:  Surgeon(s) and Role:    * Erline Levine, MD - Primary  PHYSICIAN ASSISTANT: Christella Noa, MD  ASSISTANTS: [place], RN   ANESTHESIA:   general  EBL:  Total I/O In: 2500 [I.V.:2500] Out: 730 [Urine:530; Blood:200]  BLOOD ADMINISTERED:none  DRAINS: (Medium) Hemovact drain(s) in the paravertebral space with  Suction Open   LOCAL MEDICATIONS USED:  MARCAINE    and LIDOCAINE   SPECIMEN:  No Specimen  DISPOSITION OF SPECIMEN:  N/A  COUNTS:  YES  TOURNIQUET:  * No tourniquets in log *  DICTATION: Patient is 81 year old woman who has undergone Stage I ALIF and XLIF 2 days ago who now presents for Stage II decompression and fusion T 6 to [place].  It was elected to take her to surgery for fusion from T 6 through ilium.  She has severe scoliosis with significant persistent sagittal imbalance despite anterior and lateral correction and it was elected to perform osteotomies at T 11 -T 12 levels.   Procedure: Patient was placed in a prone position on the [place] table after smooth and uncomplicated induction of general endotracheal anesthesia. Her back was prepped and draped in usual sterile fashion with betadine scrub and DuraPrep. Area of incision was infiltrated with local lidocaine. Incision was made to the lumbodorsal fascia was incised and exposure was performed of the T 6 to the ilium bilaterally with sub-fascial exposure of Iliac crests bilaterally.. Prior hardware was exposed and removed.   There was evidence of solid arthrodesis at previously operated levels.  The posterolateral region was extensively decorticated and pedicle screws were placed at T 6  through ilium bilaterally using intraoperative navigation.  At T 6, 5.5 x 35 mm screws were placed bilaterally, at T 7 5.5 x 40 left and 5.5 x 35 right, 5.5 x 40 bilaterally at T 8, 5.5 x 45 left T 9, 6.5 x 40 mm right T 9, 6.5 x 45 left T 10, 5.5 x 45 mm right T 10, 6.5 x 45 bilateral T 11, 6.5 x 45 T 11 bilaterally, left T 12 6.5 x 45, right T 12 5.5 x 45, left L 1 6.5 x 45, right L 1 5.5 x 45, L 2 left 7.5 x 35, right L 2 6.5 x 35, right L 3 6.5 x 35, left L 3 7.5 x 35, right L 4 7.5 x 40, L 5 7.5 x 40 bilaterally, S 1 7.5 x 45 bilaterally, Iliac bolts 8.5 x 60 bilaterally.  All screws were confirmed with Airo intraoperative CT scanner and were placed using Airo intraoperative navigation.   Offset connectors were affixed to the Iliac screw heads.    A Smith Peterson osteotomy of T 11 - T 12 was performed and bilateral facetectomies with decompression of thecal sac and nerve roots was performed and this level was compressed along with the L 1 2 level. Bendini rod bending system was utilized and rods were bent, affixed to screw heads and locked down with 7 degrees of additional correction in addition to the previously achieved greater than 20  degrees of correction.   Autograft was harvested from both ilia for use in fusion and the spinous processes and laminae and facetectomies along with marrow rich blood. Rods were cut, bent and locked down bilaterally in situ and the posterolateral region was packed with 160 cc allograft bone graft. The wound was irrigated. A medium Hemovac drain was placed. Long-acting Marcaine was injected into the paraspinous musculature.  Fascia was closed with 1 Vicryl sutures skin edges were reapproximated 2 and 3-0 Vicryl sutures. The wound was dressed with Dermabond and an occlusive dressing.  the patient was extubated in  the operating room and taken to recovery in stable satisfactory condition she tolerated the operation well counts were correct at the end of the case.     PLAN OF CARE: Admit to inpatient   PATIENT DISPOSITION:  PACU - hemodynamically stable.   Delay start of Pharmacological VTE agent (>24hrs) due to surgical blood loss or risk of bleeding: yes

## 2016-06-05 NOTE — Anesthesia Preprocedure Evaluation (Addendum)
Anesthesia Evaluation  Patient identified by MRN, date of birth, ID band Patient awake    Reviewed: Allergy & Precautions, NPO status , Patient's Chart, lab work & pertinent test results  Airway Mallampati: II  TM Distance: >3 FB Neck ROM: Full    Dental  (+) Teeth Intact, Dental Advisory Given   Pulmonary    breath sounds clear to auscultation       Cardiovascular hypertension,  Rhythm:Regular Rate:Normal     Neuro/Psych    GI/Hepatic   Endo/Other    Renal/GU      Musculoskeletal   Abdominal   Peds  Hematology   Anesthesia Other Findings   Reproductive/Obstetrics                            Anesthesia Physical Anesthesia Plan  ASA: III  Anesthesia Plan: General   Post-op Pain Management:    Induction: Intravenous  Airway Management Planned: Oral ETT  Additional Equipment: Arterial line and CVP  Intra-op Plan:   Post-operative Plan: Extubation in OR  Informed Consent: I have reviewed the patients History and Physical, chart, labs and discussed the procedure including the risks, benefits and alternatives for the proposed anesthesia with the patient or authorized representative who has indicated his/her understanding and acceptance.   Dental advisory given  Plan Discussed with: CRNA and Anesthesiologist  Anesthesia Plan Comments:         Anesthesia Quick Evaluation

## 2016-06-05 NOTE — Transfer of Care (Signed)
Immediate Anesthesia Transfer of Care Note  Patient: Allison Mclean  Procedure(s) Performed: Procedure(s): Thoracic six to pelvis fixation with Smith-Peterson Thoracic osteotomies at Thoracic ten-Thoracic Twelve with AIRO, Removal hardware lumbar two to lumbar five (N/A) APPLICATION OF INTRAOPERATIVE CT SCAN (N/A)  Patient Location: PACU  Anesthesia Type:General  Level of Consciousness: sedated and responds to stimulation  Airway & Oxygen Therapy: Patient Spontanous Breathing and Patient connected to face mask oxygen  Post-op Assessment: Report given to RN and Post -op Vital signs reviewed and stable  Post vital signs: Reviewed and stable  Last Vitals:  Vitals:   06/05/16 0808 06/05/16 1640  BP: 139/61   Pulse: 93   Resp: 18   Temp: 36.9 C 36.2 C    Last Pain:  Vitals:   06/05/16 0324  TempSrc: Oral  PainSc:       Patients Stated Pain Goal: 3 ([id number])  Complications: No apparent anesthesia complications

## 2016-06-05 NOTE — Anesthesia Procedure Notes (Signed)
Central Venous Catheter Insertion Performed by: Roberts Gaudy, anesthesiologist Start/End3/05/2016 9:20 AM, 06/05/2016 9:25 AM Preanesthetic checklist: patient identified, IV checked, site marked, risks and benefits discussed, surgical consent, monitors and equipment checked, pre-op evaluation and timeout performed Position: supine Hand hygiene performed , maximum sterile barriers used  and Seldinger technique used Catheter size: 8 Fr Central line was placed.Double lumen Ultrasound Notes:anatomy identified, needle tip was noted to be adjacent to the nerve/plexus identified, no ultrasound evidence of intravascular and/or intraneural injection and image(s) printed for medical record Attempts: 1 Following insertion, line sutured, dressing applied and Biopatch. Post procedure assessment: blood return through all ports, free fluid flow and no air

## 2016-06-05 NOTE — Op Note (Signed)
06/02/2016 - 06/05/2016  4:14 PM  PATIENT:  Allison Mclean  81 y.o. female  PRE-OPERATIVE DIAGNOSIS:  Kyphoscoliosis and scoliosis   POST-OPERATIVE DIAGNOSIS:  Kyphoscoliosis and scoliosis  PROCEDURE:  Procedure(s): Thoracic six to pelvis fixation with Smith-Peterson Thoracic osteotomies at Thoracic eleven to Thoracic Twelve with AIRO, Removal hardware lumbar two to lumbar five (N/A) with posterolateral arthrodesis with local autograft and allograft   APPLICATION OF INTRAOPERATIVE CT SCAN (N/A)  SURGEON:  Surgeon(s) and Role:    * Erline Levine, MD - Primary  PHYSICIAN ASSISTANT: Christella Noa, MD  ASSISTANTS: Poteat, RN   ANESTHESIA:   general  EBL:  Total I/O In: 2500 [I.V.:2500] Out: 730 [Urine:530; Blood:200]  BLOOD ADMINISTERED:none  DRAINS: (Medium) Hemovact drain(s) in the paravertebral space with  Suction Open   LOCAL MEDICATIONS USED:  MARCAINE    and LIDOCAINE   SPECIMEN:  No Specimen  DISPOSITION OF SPECIMEN:  N/A  COUNTS:  YES  TOURNIQUET:  * No tourniquets in log *  DICTATION: Patient is 81 year old woman who has undergone Stage I ALIF and XLIF 2 days ago who now presents for Stage II decompression and fusion T 6 to Ilium.  It was elected to take her to surgery for fusion from T 6 through ilium.  She has severe scoliosis with significant persistent sagittal imbalance despite anterior and lateral correction and it was elected to perform osteotomies at T 11 -T 12 levels.   Procedure: Patient was placed in a prone position on the [place] table after smooth and uncomplicated induction of general endotracheal anesthesia. Her back was prepped and draped in usual sterile fashion with betadine scrub and DuraPrep. Area of incision was infiltrated with local lidocaine. Incision was made to the lumbodorsal fascia was incised and exposure was performed of the T 6 to the ilium bilaterally with sub-fascial exposure of Iliac crests bilaterally.. Prior hardware was exposed and removed.   There was evidence of solid arthrodesis at previously operated levels.  The posterolateral region was extensively decorticated and pedicle screws were placed at T 6  through ilium bilaterally using intraoperative navigation.  At T 6, 5.5 x 35 mm screws were placed bilaterally, at T 7 5.5 x 40 left and 5.5 x 35 right, 5.5 x 40 bilaterally at T 8, 5.5 x 45 left T 9, 6.5 x 40 mm right T 9, 6.5 x 45 left T 10, 5.5 x 45 mm right T 10, 6.5 x 45 bilateral T 11, 6.5 x 45 T 11 bilaterally, left T 12 6.5 x 45, right T 12 5.5 x 45, left L 1 6.5 x 45, right L 1 5.5 x 45, L 2 left 7.5 x 35, right L 2 6.5 x 35, right L 3 6.5 x 35, left L 3 7.5 x 35, right L 4 7.5 x 40, L 5 7.5 x 40 bilaterally, S 1 7.5 x 45 bilaterally, Iliac bolts 8.5 x 60 bilaterally.  All screws were confirmed with Airo intraoperative CT scanner and were placed using Airo intraoperative navigation.   Offset connectors were affixed to the Iliac screw heads.    A Smith Peterson osteotomy of T 11 - T 12 was performed and bilateral facetectomies with decompression of thecal sac and nerve roots was performed and this level was compressed along with the L 1 2 level. Bendini rod bending system was utilized and rods were bent, affixed to screw heads and locked down with 7 degrees of additional correction in addition to the previously achieved greater than 20  degrees of correction.   Autograft was harvested from both ilia for use in fusion and the spinous processes and laminae and facetectomies along with marrow rich blood. Rods were cut, bent and locked down bilaterally in situ and the posterolateral region was packed with 160 cc allograft bone graft. The wound was irrigated. A medium Hemovac drain was placed. Long-acting Marcaine was injected into the paraspinous musculature.  Fascia was closed with 1 Vicryl sutures skin edges were reapproximated 2 and 3-0 Vicryl sutures. The wound was dressed with Dermabond and an occlusive dressing.  the patient was extubated in  the operating room and taken to recovery in stable satisfactory condition she tolerated the operation well counts were correct at the end of the case.     PLAN OF CARE: Admit to inpatient   PATIENT DISPOSITION:  PACU - hemodynamically stable.   Delay start of Pharmacological VTE agent (>24hrs) due to surgical blood loss or risk of bleeding: yes

## 2016-06-05 NOTE — Progress Notes (Signed)
Awake, alert, conversant.  MAEW with good strength and without numbness.  Doing well.  Pain is well controlled.

## 2016-06-05 NOTE — Progress Notes (Signed)
Patient ID: Allison Mclean, female   DOB: 09/23/1935, 81 y.o.   MRN: [id number] Waking slowly, but following commands. Good strength BLE. Will transfer to [facility] or 5MW rather than 3C from PACU.

## 2016-06-06 MED ORDER — SODIUM CHLORIDE 0.9% FLUSH
10.0000 mL | INTRAVENOUS | Status: DC | PRN
  Administered 2016-06-06: 20 mL
  Filled 2016-06-06: qty 40

## 2016-06-06 NOTE — Progress Notes (Signed)
Patient ambulated with writer about 200 feet. Patient tolerated activity well. Patient also educated on the use of incentive spirometer. Patient demonstrated understanding. 2000 ml achieved.

## 2016-06-06 NOTE — Progress Notes (Signed)
Occupational Therapy Treatment Patient Details Name: Allison Mclean MRN: [id number] DOB: 16-Aug-1935 Today's Date: 06/06/2016    History of present illness Pt is a 81 y/o female who presents s/p L1-L2 ALIF and L5-S1 ALIF on 06/02/16. Pt to go for second stage of surgery possibly 06/05/16.   OT comments  Progressing well with acute OT goals.  Able to perform bed mobility with min a.  Able to complete in room ambulation to and from b.room including pericare.  Will continue to follow acutely.    Follow Up Recommendations  SNF , wants placement at [facility], states she has already spoken with admissions department there prior to her sx.   Equipment Recommendations       Recommendations for Other Services      Precautions / Restrictions Precautions Precautions: Fall;Back Precaution Comments: Pt able to recall 3/3 precautions with no vc needed Required Braces or Orthoses: Spinal Brace Spinal Brace: Thoracolumbosacral orthotic;Applied in sitting position       Mobility Bed Mobility Overal bed mobility: Needs Assistance Bed Mobility: Rolling;Sidelying to Sit Rolling: Min guard Sidelying to sit: Min guard       General bed mobility comments: HOB slightly elevated with use of bed rail.  rolled R and was able to utilize log roll to get to eob  Transfers Overall transfer level: Needs assistance Equipment used: Rolling walker (2 wheeled) Transfers: Sit to/from [facility] Sit to Stand: Min guard Stand pivot transfers: Min guard       General transfer comment: min vc for precautions, good hand placement    Balance                                   ADL Overall ADL's : Needs assistance/impaired                 Upper Body Dressing : Minimal assistance;Sitting Upper Body Dressing Details (indicate cue type and reason): required assistance donning brace     Toilet Transfer: Copy Details (indicate cue type and  reason): good hand placement and use of arm rests Toileting- Water quality scientist and Hygiene: Supervision/safety;Sit to/from stand       Functional mobility during ADLs: Min guard        Vision                     Perception     Praxis      Cognition   Behavior During Therapy: Flat affect Overall Cognitive Status: Within Functional Limits for tasks assessed                         Exercises     Shoulder Instructions       General Comments      Pertinent Vitals/ Pain       Pain Assessment: 0-10 Pain Score: 5  Pain Location: Incision site Pain Descriptors / Indicators: Operative site guarding;Discomfort;Grimacing Pain Intervention(s): Limited activity within patient's tolerance;Monitored during session;Premedicated before session;Repositioned  Home Living                                          Prior Functioning/Environment              Frequency  Min 2X/week        Progress  Toward Goals  OT Goals(current goals can now be found in the care plan section)  Progress towards OT goals: Progressing toward goals     Plan Discharge plan remains appropriate    Co-evaluation                 End of Session Equipment Utilized During Treatment: Gait belt;Rolling walker;Back brace  OT Visit Diagnosis: Other abnormalities of gait and mobility (R26.89)   Activity Tolerance Patient tolerated treatment well   Patient Left in chair;with call bell/phone within reach   Nurse Communication Other (comment) (informed RN pts. request to move forward with [facility] placement for continued therapy)        Time: 0940-1007 OT Time Calculation (min): 27 min  Charges: OT General Charges $OT Visit: 1 Procedure OT Treatments $Self Care/Home Management : 23-37 mins   Janice Coffin, COTA/L 06/06/2016, 10:13 AM

## 2016-06-06 NOTE — Progress Notes (Signed)
Patient ID: Allison Mclean, female   DOB: [birth year], 81 y.o.   MRN: [id number] BP (!) 134/47 (BP Location: Left Arm)   Pulse 88   Temp 98.1 F (36.7 C) (Oral)   Resp 18   Ht 5\' 3"  (1.6 m)   Wt 82.7 kg (182 lb 4.8 oz)   SpO2 100%   BMI 32.29 kg/m  Alert and oriented x 4 Moving lower extremities well Wound is clean, dry, and without signs of infection.  Continue to work with physicaL therapy She is happy to stand erect, and be able to look up when she is walking

## 2016-06-06 NOTE — Progress Notes (Signed)
Physical Therapy Treatment/ Re-evaluation Patient Details Name: Allison Mclean MRN: [id number] DOB: September 27, 1935 Today's Date: 06/06/2016    History of Present Illness Pt is a 81 y/o female who presents s/p L1-L2 ALIF and L5-S1 ALIF on 06/02/16. Pt to go for second stage of surgery possibly 06/05/16.    PT Comments    Patients gait distance and endurance continues to be limited but it has not changed much since the initial surgery. Her plan remains the same. She would benefit from skilled therapy at a SNF as well as continued acute therapy.   Follow Up Recommendations  SNF;Supervision/Assistance - 24 hour     Equipment Recommendations  Other (comment)    Recommendations for Other Services       Precautions / Restrictions Precautions Precautions: Fall;Back Precaution Comments: Pt able to recall 3/3 precautions with no vc needed Required Braces or Orthoses: Spinal Brace Spinal Brace: Thoracolumbosacral orthotic;Applied in sitting position Restrictions Weight Bearing Restrictions: No    Mobility  Bed Mobility Overal bed mobility: Needs Assistance Bed Mobility: Rolling;Sidelying to Sit Rolling: Min guard Sidelying to sit: Min assist       General bed mobility comments: Min a to sit at the edgeof the bed.   Transfers Overall transfer level: Needs assistance Equipment used: Rolling walker (2 wheeled) Transfers: Sit to/from [facility] Sit to Stand: Min guard Stand pivot transfers: Min guard       General transfer comment: Min guard for inital balance   Ambulation/Gait Ambulation/Gait assistance: Min guard Ambulation Distance (Feet): 100 Feet Assistive device: Rolling walker (2 wheeled) Gait Pattern/deviations: Step-through pattern;Decreased stride length;Trunk flexed Gait velocity: Decreased Gait velocity interpretation: Below normal speed for age/gender General Gait Details: continued cuing to stand straight    Stairs            Wheelchair  Mobility    Modified Rankin (Stroke Patients Only)       Balance Overall balance assessment: Needs assistance Sitting-balance support: Feet supported;No upper extremity supported Sitting balance-Leahy Scale: Fair     Standing balance support: No upper extremity supported;During functional activity Standing balance-Leahy Scale: Fair                      Cognition Arousal/Alertness: Awake/alert   Overall Cognitive Status: Within Functional Limits for tasks assessed                      Exercises      General Comments        Pertinent Vitals/Pain Pain Assessment: 0-10 Pain Score: 7  Pain Location: Incision site Pain Descriptors / Indicators: Operative site guarding;Discomfort;Grimacing Pain Intervention(s): Limited activity within patient's tolerance;Monitored during session;Premedicated before session;Repositioned    Home Living                      Prior Function            PT Goals (current goals can now be found in the care plan section) Acute Rehab PT Goals Patient Stated Goal: Home after rehab PT Goal Formulation: With patient Time For Goal Achievement: 06/17/16 Potential to Achieve Goals: Good    Frequency    Min 5X/week      PT Plan Current plan remains appropriate    Co-evaluation             End of Session Equipment Utilized During Treatment: Back brace Activity Tolerance: Patient limited by fatigue;Patient limited by pain Patient left: in chair;with  call bell/phone within reach Nurse Communication: Mobility status PT Visit Diagnosis: Other abnormalities of gait and mobility (R26.89);Pain     Time: 1410-1430 PT Time Calculation (min) (ACUTE ONLY): 20 min  Charges:  $Gait Training: 8-22 mins                    G Codes:       Carney Living PT DPT  06/06/2016, 3:43 PM

## 2016-06-07 ENCOUNTER — Inpatient Hospital Stay (HOSPITAL_COMMUNITY): Payer: Medicare Other

## 2016-06-07 NOTE — Progress Notes (Signed)
Writer ambulated patient about 200 feet. Patient tolerated activity well.

## 2016-06-07 NOTE — Progress Notes (Signed)
Patient ID: Allison Mclean, female   DOB: 1935/06/02, 81 y.o.   MRN: [id number] BP (!) 117/44 (BP Location: Left Arm)   Pulse 94   Temp 99 F (37.2 C) (Oral)   Resp 18   Ht 5\' 3"  (1.6 m)   Wt 82.7 kg (182 lb 4.8 oz)   SpO2 95%   BMI 32.29 kg/m  Alert and oriented x 4, speech is clear and fluent Moving all extremities well Expected post op pain in back Wound is clean, dry, without signs of infection Continue with pt

## 2016-06-08 MED ORDER — ENSURE ENLIVE PO LIQD
237.0000 mL | Freq: Two times a day (BID) | ORAL | Status: DC
  Administered 2016-06-08 – 2016-06-09 (×4): 237 mL via ORAL
  Filled 2016-06-08 (×6): qty 237

## 2016-06-08 MED FILL — Sodium Chloride IV Soln 0.9%: INTRAVENOUS | Qty: 1000 | Status: AC

## 2016-06-08 MED FILL — Heparin Sodium (Porcine) Inj 1000 Unit/ML: INTRAMUSCULAR | Qty: 30 | Status: AC

## 2016-06-08 NOTE — Care Management Important Message (Signed)
Important Message  Patient Details  Name: Allison Mclean MRN: [id number] Date of Birth: 10/17/1935   Medicare Important Message Given:  Yes    [name] 06/08/2016, 1:13 PM

## 2016-06-08 NOTE — Progress Notes (Signed)
CSW spoke with patient at bedside to discuss SNF offers. Patient has chosen [facility] Admission Coordinator at [facility]) stated that patient will be placed in room 201 B and report number for nurse will be [phone]. Per case manager patient should be ready for discharge tomorrow.   Rhea Pink, MSW,  [place]

## 2016-06-08 NOTE — Progress Notes (Addendum)
Subjective: Patient reports "I'm just weak. I don't have much appetite"  Objective: Vital signs in last 24 hours: Temp:  [98.8 F (37.1 C)-100.3 F (37.9 C)] 99.6 F (37.6 C) (03/05 0500) Pulse Rate:  [76-98] 84 (03/05 0500) Resp:  [18-20] 20 (03/05 0500) BP: (110-134)/(32-62) 128/51 (03/05 0500) SpO2:  [94 %-96 %] 94 % (03/05 0500)  Intake/Output from previous day: No intake/output data recorded. Intake/Output this shift: No intake/output data recorded.  Alert, sitting in chair, back from bathroom. Reports minimal back pain, no abdominal pain. No distention or tenderness. No BM yet. Incisions without erythema, swelling, or drainage. Hemovac trace last 24hrs. Good strength BLE.   Lab Results: No results for input(s): WBC, HGB, HCT, PLT in the last 72 hours. BMET No results for input(s): NA, K, CL, CO2, GLUCOSE, BUN, CREATININE, CALCIUM in the last 72 hours.  Studies/Results: Dg Scoliosis Eval Complete Spine 2 Or 3 Views  Result Date: 06/07/2016 CLINICAL DATA:  Scoliosis. EXAM: DG SCOLIOSIS EVAL COMPLETE SPINE 2-3V COMPARISON:  06/03/2016 scoliosis radiographs. 06/02/2016 intraoperative lumbar spine fluoroscopic images. FINDINGS: Numbering as on the prior radiographs. There are no ribs at T12. Since the prior radiographs, the posterior spinal fusion has been extended. The fusion now extends from T6 to the sacrum/ pelvis. Bilateral pedicle screws are present at each level with the exception of a unilateral right-sided screw at L4. Interbody fusion devices are present at L1-2, L3-4, and L4-5. Sequelae of anterior fusion are again identified at L5-S1. There is minimal fused anterolisthesis of L4 on L5. No significant scoliosis is apparent in the coronal plane. Surgical clips are present in the abdomen/ retroperitoneum. Mild gaseous distention of small and large bowel loops is partially visualized. A right jugular catheter terminates over the lower SVC. IMPRESSION: Interval extension of  posterior spinal fusion as above without evidence of acute complication. Electronically Signed   By: Logan Bores M.D.   On: 06/07/2016 09:10    Assessment/Plan: Progressing slowly  LOS: 6 days  Ok per DrStern to pull Hemovac. Encourage p.o. intake, adding Boost/Ensure as appropriate. Will leave central line in place for now. Continue to mobilize in brace.    Verdis Prime 06/08/2016, 8:07 AM   Patient is doing well.  Encourage po intake and mobilize with PT.  To Rehab in am if doing well.

## 2016-06-08 NOTE — Care Management Important Message (Signed)
Important Message  Patient Details  Name: ZEHAVA HARDNETT MRN: [id number] Date of Birth: 05-16-1935   Medicare Important Message Given:  Yes    Terren Jandreau 06/08/2016, 1:54 PM

## 2016-06-08 NOTE — Progress Notes (Signed)
Occupational Therapy Treatment Patient Details Name: Allison Mclean MRN: [id number] DOB: 1936-03-10 Today's Date: 06/08/2016    History of present illness Pt is a 81 y/o female who presents s/p L1-L2 ALIF and L5-S1 ALIF on 06/02/16. Pt to go for second stage of surgery possibly 06/05/16.   OT comments  Pt progressing well toward OT goals. She was able to complete toilet transfer and standing grooming tasks with min guard assist. She continues to require min assist to don brace as well as for bed mobility in preparation for ADL tasks. She demonstrates decreased activity tolerance for ADL and requires increased time for all aspects. Continue to recommend short-term SNF placement for continued OT services in order to maximize return to PLOF. OT will continue to follow acutely.   Follow Up Recommendations  SNF    Equipment Recommendations  Other (comment) (Defer to next venue of care)    Recommendations for Other Services      Precautions / Restrictions Precautions Precautions: Fall;Back Precaution Booklet Issued: Yes (comment) Precaution Comments: Pt able to recall 3/3 precautions with no vc needed Required Braces or Orthoses: Spinal Brace Spinal Brace: Thoracolumbosacral orthotic;Applied in sitting position Restrictions Weight Bearing Restrictions: No       Mobility Bed Mobility Overal bed mobility: Needs Assistance Bed Mobility: Rolling;Sidelying to Sit Rolling: Supervision Sidelying to sit: Min assist       General bed mobility comments: Assist to elebate trunk into sitting position. Increased time.  Transfers Overall transfer level: Needs assistance Equipment used: Rolling walker (2 wheeled) Transfers: Sit to/from Stand Sit to Stand: Min guard         General transfer comment: Min guard assist for safety. Increased time required.    Balance Overall balance assessment: Needs assistance Sitting-balance support: Feet supported;No upper extremity supported Sitting  balance-Leahy Scale: Fair     Standing balance support: No upper extremity supported;During functional activity Standing balance-Leahy Scale: Fair Standing balance comment: Able to stand at sink with min guard assist and no UE support during grooming tasks.                   ADL Overall ADL's : Needs assistance/impaired     Grooming: Oral care;Supervision/safety;Standing;Brushing hair           Upper Body Dressing : Minimal assistance;Sitting Upper Body Dressing Details (indicate cue type and reason): required assistance donning brace     Toilet Transfer: Min guard;Ambulation;RW Toilet Transfer Details (indicate cue type and reason): Min guard assist for safety.         Functional mobility during ADLs: Min guard;Rolling walker General ADL Comments: Pt educated concerning adhering to back precautions while standing at sink for grooming tasks.      Vision                     Perception     Praxis      Cognition   Behavior During Therapy: Flat affect Overall Cognitive Status: Within Functional Limits for tasks assessed                         Exercises     Shoulder Instructions       General Comments      Pertinent Vitals/ Pain       Pain Assessment: Faces Faces Pain Scale: Hurts even more Pain Location: Incision site Pain Descriptors / Indicators: Operative site guarding;Discomfort Pain Intervention(s): Limited activity within patient's tolerance;Monitored during session;Repositioned  Home Living                                          Prior Functioning/Environment              Frequency  Min 2X/week        Progress Toward Goals  OT Goals(current goals can now be found in the care plan section)  Progress towards OT goals: Progressing toward goals  Acute Rehab OT Goals Patient Stated Goal: Home after rehab OT Goal Formulation: With patient Time For Goal Achievement: 06/17/16 Potential to  Achieve Goals: Good ADL Goals Additional ADL Goal #3: Pt will perform LB ADL min (A) with AE PRN   Plan Discharge plan remains appropriate    Co-evaluation                 End of Session Equipment Utilized During Treatment: Rolling walker;Back brace  OT Visit Diagnosis: Other abnormalities of gait and mobility (R26.89)   Activity Tolerance Patient tolerated treatment well   Patient Left in chair;with call bell/phone within reach   Nurse Communication          Time: [id number] OT Time Calculation (min): 20 min  Charges: OT General Charges $OT Visit: 1 Procedure OT Treatments $Self Care/Home Management : 8-22 mins  Norman Herrlich, MS OTR/L  Pager: Allison Mclean 06/08/2016, 5:18 PM

## 2016-06-08 NOTE — Progress Notes (Signed)
Physical Therapy Treatment Patient Details Name: Allison Mclean MRN: [id number] DOB: December 18, 1935 Today's Date: 06/08/2016    History of Present Illness Pt is a 81 y/o female who presents s/p L1-L2 ALIF and L5-S1 ALIF on 06/02/16. Pt to go for second stage of surgery possibly 06/05/16.    PT Comments    Pt progressing towards physical therapy goals. Overall good rehab effort, however feel pt would benefit from more time OOB.  Encouraged OOB for meals, walking to bathroom to void, and to ask nursing to walk with her later today. Will continue to follow and progress as able per POC.   Follow Up Recommendations  SNF;Supervision/Assistance - 24 hour     Equipment Recommendations  Other (comment)    Recommendations for Other Services       Precautions / Restrictions Precautions Precautions: Fall;Back Precaution Booklet Issued: Yes (comment) Precaution Comments: Pt able to recall 3/3 precautions with no vc needed Required Braces or Orthoses: Spinal Brace Spinal Brace: Thoracolumbosacral orthotic;Applied in sitting position Restrictions Weight Bearing Restrictions: No    Mobility  Bed Mobility Overal bed mobility: Needs Assistance Bed Mobility: Rolling;Sidelying to Sit Rolling: Supervision Sidelying to sit: Min assist       General bed mobility comments: Pt required light assist to elevate trunk to full sitting position. Increased time required.   Transfers Overall transfer level: Needs assistance Equipment used: Rolling walker (2 wheeled) Transfers: Sit to/from Stand Sit to Stand: Min guard         General transfer comment: Hands-on guarding as pt powered up to full standing position.   Ambulation/Gait Ambulation/Gait assistance: Min guard Ambulation Distance (Feet): 110 Feet Assistive device: Rolling walker (2 wheeled) Gait Pattern/deviations: Step-through pattern;Decreased stride length;Trunk flexed Gait velocity: Decreased Gait velocity interpretation: Below normal  speed for age/gender General Gait Details: VC's for forward gaze, improved posture, and walker management. 2 standing rest breaks due to pain/fatigue.    Stairs            Wheelchair Mobility    Modified Rankin (Stroke Patients Only)       Balance Overall balance assessment: Needs assistance Sitting-balance support: Feet supported;No upper extremity supported Sitting balance-Leahy Scale: Fair     Standing balance support: No upper extremity supported;During functional activity Standing balance-Leahy Scale: Fair                      Cognition Arousal/Alertness: Awake/alert Behavior During Therapy: Flat affect Overall Cognitive Status: Within Functional Limits for tasks assessed                      Exercises      General Comments        Pertinent Vitals/Pain Pain Assessment: 0-10 Pain Score: 4  Pain Location: Incision site Pain Descriptors / Indicators: Operative site guarding;Discomfort Pain Intervention(s): Limited activity within patient's tolerance;Monitored during session;Repositioned    Home Living                      Prior Function            PT Goals (current goals can now be found in the care plan section) Acute Rehab PT Goals PT Goal Formulation: With patient Time For Goal Achievement: 06/17/16 Potential to Achieve Goals: Good Progress towards PT goals: Progressing toward goals    Frequency    Min 5X/week      PT Plan Current plan remains appropriate    Co-evaluation  End of Session Equipment Utilized During Treatment: Back brace Activity Tolerance: Patient limited by fatigue;Patient limited by pain Patient left: in chair;with call bell/phone within reach Nurse Communication: Mobility status PT Visit Diagnosis: Other abnormalities of gait and mobility (R26.89);Pain Pain - part of body:  (Back)     Time: [id number] PT Time Calculation (min) (ACUTE ONLY): 28 min  Charges:  $Gait  Training: 23-37 mins                    G Codes:       [name] 2016-06-11, 12:47 PM   Rolinda Roan, PT, DPT Acute Rehabilitation Services Pager: [phone]

## 2016-06-09 ENCOUNTER — Encounter (HOSPITAL_COMMUNITY): Payer: Self-pay | Admitting: Neurosurgery

## 2016-06-09 MED ORDER — METHOCARBAMOL 500 MG PO TABS: 500.0000 mg | ORAL_TABLET | Freq: Four times a day (QID) | ORAL | 1 refills | Status: DC | PRN

## 2016-06-09 NOTE — Progress Notes (Addendum)
Subjective: Patient reports "I don't really hurt. I just still feel weak"  Objective: Vital signs in last 24 hours: Temp:  [97.5 F (36.4 C)-98.8 F (37.1 C)] 98 F (36.7 C) (03/06 0456) Pulse Rate:  [69-86] 75 (03/06 0456) Resp:  [16-20] 18 (03/06 0456) BP: (113-136)/(43-52) 114/45 (03/06 0456) SpO2:  [97 %-99 %] 97 % (03/06 0456)  Intake/Output from previous day: 03/05 0701 - 03/06 0700 In: 3 [I.V.:3] Out: -  Intake/Output this shift: No intake/output data recorded.  Alert, conversant. Reports walking in hallway with PT yesterday. Good strength BLE. Incisions without erythema, swelling, or drainage. Some abdominal distention this morning and she reports "feeling tight". Passing gas, but no BM yet. Reports minimal pain.   Lab Results: No results for input(s): WBC, HGB, HCT, PLT in the last 72 hours. BMET No results for input(s): NA, K, CL, CO2, GLUCOSE, BUN, CREATININE, CALCIUM in the last 72 hours.  Studies/Results: No results found.  Assessment/Plan: improving  LOS: 7 days  Will work on bowels this morning, continue to mobilize in brace, and possibly d/c to [facility] this afternoon.    Verdis Prime 06/09/2016, 7:14 AM   Patient has received nothing for bowels, despite multiple options already ordered as prns.  Nursing will be more aggressive with bowel management today.  Continue PT.  Plan transfer to SNF in am.

## 2016-06-09 NOTE — Progress Notes (Signed)
Patient ambulated this AM with nurse about 276ft

## 2016-06-09 NOTE — Progress Notes (Addendum)
[facility] arrived to transport patient to SNF. Report given to accepting [phone]).

## 2016-06-09 NOTE — Progress Notes (Signed)
Clinical Social Worker facilitated patient discharge including contacting patient family and facility to confirm patient discharge plans.  Clinical information faxed to facility and family agreeable with plan.  CSW arranged ambulance transport via [facility] to [facility] .  RN to call [phone] report prior to discharge.  Clinical Social Worker will sign off for now as social work intervention is no longer needed. Please consult [place] again if new need arises.  Rhea Pink, MSW, [place]

## 2016-06-09 NOTE — Progress Notes (Signed)
Ambulated [id number]

## 2016-06-09 NOTE — Discharge Summary (Signed)
Physician Discharge Summary  Patient ID: Allison Mclean MRN: [id number] DOB/AGE: 06/28/1935 81 y.o.  Admit date: 06/02/2016 Discharge date: 06/09/2016  Admission Diagnoses:Scoliosis, kyphosis, stenosis, radiculopathy.  Discharge Diagnoses: Scoliosis, kyphosis, stenosis, radiculopathy; s/p staged thoracolumbar decompression and fusion T6-pelvis.   Active Problems:   Kyphoscoliosis and scoliosis   Discharged Condition: good  Hospital Course: Nelle Moustafa was admitted for surgery with dx scoliosis, kyphosis, stenosis, and radiculopathy. Following uncomplicated staged surgeries, she has mobilized nicely, reporting improved gait and decreased pain levels.   Consults: vascular surgery  Significant Diagnostic Studies: radiology: X-Ray: intra-op x-rays stage one; intra-op CT stage two   Treatments: surgery: Stage 1:L5-S1 anterior lumbar interbody fusion with Dr. Scot Dock (vascular) assisting, left anterolateral fusion L1-2.  Stage 2: T6-pelvis fixation with Airo intra-op CT.   Discharge Exam: Blood pressure (!) 114/45, pulse 93, temperature 97.8 F (36.6 C), temperature source Oral, resp. rate 18, height 5\' 3"  (1.6 m), weight 82.7 kg (182 lb 4.8 oz), SpO2 93 %. Alert, conversant. Reports walking in hallway with PT yesterday. Good strength BLE. Incisions without erythema, swelling, or drainage. Some abdominal distention this morning and she reports "feeling tight". Passing gas, but no BM yet. Reports minimal pain.  Updated.Marland KitchenMarland KitchenGood BM this afternoon and ready to transfer to [facility] as planned.     Disposition: Discharge to SNF. Rx Oxycodone 5mg  and Robaxin 500mg  tubed to floor to accompany pt. Pt should f/u in office in 3-4 weeks.   Discharge Instructions    Diet - low sodium heart healthy    Complete by:  As directed    Increase activity slowly    Complete by:  As directed      Allergies as of 06/09/2016      Reactions   Atorvastatin Other (See Comments)   Chest pain     Rosuvastatin  Other (See Comments)   Causes chest pain        Medication List    STOP taking these medications   meloxicam 15 MG tablet Commonly known as:  MOBIC     TAKE these medications   aspirin EC 81 MG tablet Take 81 mg by mouth at bedtime.   calcium citrate-vitamin D 315-200 MG-UNIT tablet Commonly known as:  CITRACAL+D Take 1 tablet by mouth daily.   cetirizine 10 MG tablet Commonly known as:  ZYRTEC Take 10 mg by mouth at bedtime.   clobetasol ointment 0.05 % Commonly known as:  TEMOVATE APPLY TWICE DAILY AS NEEDED TO SORE AREAS IN MOUTH UNTIL HEALED. AVOID FACE AND SKIN FOLDS.   esomeprazole 20 MG capsule Commonly known as:  NEXIUM Take 1 capsule (20 mg total) by mouth 2 (two) times daily before a meal. What changed:  when to take this   estradiol 1 MG tablet Commonly known as:  ESTRACE Take 1 mg by mouth daily.   fluticasone 50 MCG/ACT nasal spray Commonly known as:  FLONASE USE 2 PUFFS EACH NOSTRIL AT BEDTIME AS NEEDED FOR CONGESTION   methocarbamol 500 MG tablet Commonly known as:  ROBAXIN Take 1 tablet (500 mg total) by mouth every 6 (six) hours as needed for muscle spasms.   metoprolol succinate 50 MG 24 hr tablet Commonly known as:  TOPROL-XL Take 50 mg by mouth daily.   nystatin powder Commonly known as:  MYCOSTATIN/NYSTOP Apply 1 g topically daily. APPLIED UNDER BREAST DAILY AFTER SHOWER.   OCUVITE EXTRA PO Take 1 tablet by mouth daily.   oxyCODONE 5 MG immediate release tablet Commonly known as:  Oxy  IR/ROXICODONE Take 1 tablet (5 mg total) by mouth every 6 (six) hours as needed for severe pain.   pravastatin 80 MG tablet Commonly known as:  PRAVACHOL Take 80 mg by mouth at bedtime.   sennosides-docusate sodium 8.6-50 MG tablet Commonly known as:  SENOKOT-S Take 1 tablet by mouth daily as needed for constipation.   valsartan-hydrochlorothiazide 160-12.5 MG tablet Commonly known as:  DIOVAN-HCT Take 1 tablet by mouth daily.         Signed: Peggyann Shoals, MD 06/09/2016, 3:35 PM

## 2016-06-09 NOTE — Progress Notes (Signed)
PRN medicines administered to patient to enable her to move her bowels(see mar). While awaiting outcome, Patient is given warm prune juice. Denies pain/discomfort at this time. Son at her bedside will continue to monitor.

## 2016-06-09 NOTE — Progress Notes (Signed)
Physical Therapy Treatment Patient Details Name: Allison Mclean MRN: [id number] DOB: 1935/07/27 Today's Date: 06/09/2016    History of Present Illness Pt is a 81 y/o female who presents s/p L1-L2 ALIF and L5-S1 ALIF on 06/02/16. Pt to go for second stage of surgery possibly 06/05/16.    PT Comments    Pt progressing towards physical therapy goals. Was able to perform transfers and ambulation with increased time and cues for safety. Feel this pt could continue to benefit from more practice with log roll technique back to bed, and more time OOB. Reinforced recommendation for sitting up in chair for meals at a minimum.    Follow Up Recommendations  SNF;Supervision/Assistance - 24 hour     Equipment Recommendations  Other (comment) (TBD by next venue of care)    Recommendations for Other Services       Precautions / Restrictions Precautions Precautions: Fall;Back Precaution Comments: Pt able to recall 3/3 precautions with no vc needed Required Braces or Orthoses: Spinal Brace Spinal Brace: Thoracolumbosacral orthotic;Applied in sitting position Restrictions Weight Bearing Restrictions: No    Mobility  Bed Mobility Overal bed mobility: Needs Assistance Bed Mobility: Rolling;Sidelying to Sit;Sit to Sidelying Rolling: Supervision Sidelying to sit: Min guard     Sit to sidelying: Min assist General bed mobility comments: Assist to elevate LE's back into bed at end of session. Pt had a difficult time returning to supine with proper log roll technique. Needs more practice with this aspect of bed mobility.   Transfers Overall transfer level: Needs assistance Equipment used: Rolling walker (2 wheeled) Transfers: Sit to/from Stand Sit to Stand: Min guard         General transfer comment: Min guard assist for safety. Increased time required.  Ambulation/Gait Ambulation/Gait assistance: Min guard Ambulation Distance (Feet): 200 Feet Assistive device: Rolling walker (2  wheeled) Gait Pattern/deviations: Step-through pattern;Decreased stride length;Trunk flexed Gait velocity: Decreased Gait velocity interpretation: Below normal speed for age/gender General Gait Details: VC's for forward gaze, improved posture, and walker management. 1 standing rest break due to LE fatigue.    Stairs            Wheelchair Mobility    Modified Rankin (Stroke Patients Only)       Balance Overall balance assessment: Needs assistance Sitting-balance support: Feet supported;No upper extremity supported Sitting balance-Leahy Scale: Fair     Standing balance support: No upper extremity supported;During functional activity Standing balance-Leahy Scale: Fair                      Cognition Arousal/Alertness: Awake/alert Behavior During Therapy: Flat affect Overall Cognitive Status: Within Functional Limits for tasks assessed                      Exercises      General Comments        Pertinent Vitals/Pain Pain Assessment: Faces Faces Pain Scale: Hurts little more Pain Location: Incision site Pain Descriptors / Indicators: Operative site guarding Pain Intervention(s): Limited activity within patient's tolerance;Monitored during session;Repositioned    Home Living                      Prior Function            PT Goals (current goals can now be found in the care plan section) Acute Rehab PT Goals PT Goal Formulation: With patient Time For Goal Achievement: 06/17/16 Potential to Achieve Goals: Good Progress towards PT goals:  Progressing toward goals    Frequency    Min 5X/week      PT Plan Current plan remains appropriate    Co-evaluation             End of Session Equipment Utilized During Treatment: Back brace Activity Tolerance: Patient limited by fatigue;Patient limited by pain Patient left: in bed;with call bell/phone within reach;with bed alarm set Nurse Communication: Mobility status PT Visit  Diagnosis: Other abnormalities of gait and mobility (R26.89);Pain Pain - part of body:  (Back)     Time: [id number] PT Time Calculation (min) (ACUTE ONLY): 23 min  Charges:  $Gait Training: 23-37 mins                    G Codes:       [name] 07/03/2016, 12:49 PM   Rolinda Roan, PT, DPT Acute Rehabilitation Services Pager: [phone]

## 2016-06-09 NOTE — Progress Notes (Signed)
Pain med administered before patient was transported to SNF.

## 2016-06-09 NOTE — Care Management Note (Signed)
Case Management Note  Patient Details  Name: Allison Mclean MRN: [id number] Date of Birth: [birth year]  Subjective/Objective:                    Action/Plan: Pt discharging to [facility] today. No further needs per CM.  Expected Discharge Date:  06/09/16               Expected Discharge Plan:  Skilled Nursing Facility  In-House Referral:  Clinical Social Work  Discharge planning Services     Post Acute Care Choice:    Choice offered to:     DME Arranged:    DME Agency:     HH Arranged:    [place] Agency:     Status of Service:  Completed, signed off  If discussed at [employer], dates discussed:    Additional Comments:  Pollie Friar, RN 06/09/2016, 6:48 PM

## 2016-06-13 ENCOUNTER — Other Ambulatory Visit
Admission: RE | Admit: 2016-06-13 | Discharge: 2016-06-13 | Disposition: A | Discharge status: Home / Self Care | Payer: Medicare Other | Source: Skilled Nursing Facility | Attending: Internal Medicine | Admitting: Internal Medicine

## 2016-06-13 DIAGNOSIS — R32 Unspecified urinary incontinence: Secondary | ICD-10-CM | POA: Insufficient documentation

## 2016-06-13 DIAGNOSIS — R3915 Urgency of urination: Secondary | ICD-10-CM | POA: Insufficient documentation

## 2016-06-13 LAB — URINALYSIS, COMPLETE (UACMP) WITH MICROSCOPIC
BILIRUBIN URINE: NEGATIVE
Glucose, UA: NEGATIVE mg/dL
Hgb urine dipstick: NEGATIVE
KETONES UR: NEGATIVE mg/dL
LEUKOCYTES UA: NEGATIVE
NITRITE: NEGATIVE
Protein, ur: NEGATIVE mg/dL
RBC / HPF: NONE SEEN RBC/hpf (ref 0–5)
Specific Gravity, Urine: 1.009 (ref 1.005–1.030)
pH: 7 (ref 5.0–8.0)

## 2016-06-15 LAB — URINE CULTURE

## 2016-06-18 ENCOUNTER — Other Ambulatory Visit
Admission: RE | Admit: 2016-06-18 | Discharge: 2016-06-18 | Disposition: A | Discharge status: Home / Self Care | Payer: Medicare Other | Source: Skilled Nursing Facility | Attending: Internal Medicine | Admitting: Internal Medicine

## 2016-06-18 ENCOUNTER — Non-Acute Institutional Stay (SKILLED_NURSING_FACILITY): Payer: Medicare Other | Admitting: Gerontology

## 2016-06-18 DIAGNOSIS — R32 Unspecified urinary incontinence: Secondary | ICD-10-CM | POA: Insufficient documentation

## 2016-06-18 DIAGNOSIS — R3915 Urgency of urination: Secondary | ICD-10-CM | POA: Diagnosis not present

## 2016-06-18 DIAGNOSIS — M419 Scoliosis, unspecified: Secondary | ICD-10-CM

## 2016-06-18 DIAGNOSIS — Z9889 Other specified postprocedural states: Secondary | ICD-10-CM

## 2016-06-18 LAB — URINALYSIS, COMPLETE (UACMP) WITH MICROSCOPIC
Bilirubin Urine: NEGATIVE
Glucose, UA: NEGATIVE mg/dL
Hgb urine dipstick: NEGATIVE
KETONES UR: NEGATIVE mg/dL
Leukocytes, UA: NEGATIVE
Nitrite: NEGATIVE
PH: 5 (ref 5.0–8.0)
PROTEIN: NEGATIVE mg/dL
Specific Gravity, Urine: 1.014 (ref 1.005–1.030)

## 2016-06-20 LAB — URINE CULTURE

## 2016-06-24 NOTE — Progress Notes (Signed)
Location:      Place of Service:  SNF (31)  Provider: Toni Arthurs, NP-C  PCP: Idelle Crouch, MD Patient Care Team: Idelle Crouch, MD as PCP - General (Internal Medicine) Lucilla Lame, MD as Consulting Physician (Gastroenterology) Erline Levine, MD as Consulting Physician (Neurosurgery) Idelle Crouch, MD (Internal Medicine) Teodoro Spray, MD as Consulting Physician (Cardiology)  Extended Emergency Contact Information Primary Emergency Contact: Champ Mungo Address: [address]          [place], [place] [address] [place] Phone: [phone] Relation: Spouse  Code Status: Full Goals of care:  Advanced Directive information Advanced Directives 06/05/2016  Does Patient Have a Medical Advance Directive? No  Would patient like information on creating a medical advance directive? No - Patient declined     Allergies  Allergen Reactions  . Atorvastatin Other (See Comments)    Chest pain    . Rosuvastatin Other (See Comments)    Causes chest pain      Chief Complaint  Patient presents with  . Discharge Note    HPI:  81 y.o. female seen today for discharge evaluation. She was admitted with kyphosis, thoracolumbar region. She is s/p thoraco-lumbar decompression. She received PT/OT for decreased mobility and independence with ADL's. Resident's condition was satisfactory at time of discharge. Pt reports her pain is well controlled. Appetite good. Stay was uneventful. Resident was discharged home with [facility]. No DME equipment needed. VSS. No other complaints.     Past Medical History:  Diagnosis Date  . Abnormal MRI, thoracic spine 07/02/2015   IMPRESSION: 1. 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 Subsequent moderate to severe adjacent segment facet arthropathy. 2. Widespread thoracic spine disc bulging. Occasional small superimposed disc protrusions (T8-9, T11-12). 3.  Subsequent multifactorial mild thoracic spinal stenosis T7-8, T8-9, T9-10, T10-11, T11-12, and T12-L1. Up to mild spi  . Acid reflux 11/22/2014  . 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 Allergy   . Chronic thoracic radicular pain (T5/T6) (Left) 07/02/2015  . Essential (primary) hypertension 12/23/2013  . GERD (gastroesophageal reflux disease)   . Heart murmur   . History of fusion of cervical spine 07/02/2015  . History of hiatal hernia    s/p  . History of kidney stones   . History of lumbar fusion (12/19/2013) (posterior lumbar fusion from L2-L5) 07/02/2015   Pedicle screws and posterior hardware at L5 L4-L3 and L2. Neil disc block at the L4-5 disc space. Slight anterior subluxation of L4 on L5. Different configuration and this had L3-4. Degenerative changes of the lumbar disc spaces. L5-S1 disc space well-maintained .   Marland Kitchen HLD (hyperlipidemia) 11/07/2013  . Hyperlipidemia   . Hypertension   . Infection    abcess in left upper tooth   . Jackhammer esophagus   . Nerve root pain 07/02/2015   Thoracic pain is likely to be secondary to severe left T5 foraminal stenosis.   . Neuropathic pain 08/05/2015  . Opiate use (10 MME/day) 07/02/2015  . Osteopenia, senile 07/02/2015  . Supraventricular tachycardia ([place]) 11/07/2013  . Thoracic central spinal stenosis (T7-8, T8-9, T9-10, T10-11, T11-12, and T12-L1) 07/02/2015  . Thoracic facet syndrome (Left) 07/02/2015  . Thoracic foraminal stenosis (Moderate to severe at: Right T5; Bilateral T9; Left T11) 07/02/2015  . Thoracic spondylosis with radiculopathy (Left) 07/09/2015  . Wears dentures    partial lower    Past Surgical History:  Procedure Laterality Date  . ABDOMINAL  EXPOSURE N/A 06/02/2016   Procedure: ABDOMINAL EXPOSURE;  Surgeon: Angelia Mould, MD;  Location: [facility];  Service: Vascular;  Laterality: N/A;  . ABDOMINAL HYSTERECTOMY    . ANTERIOR LAT LUMBAR FUSION N/A 06/02/2016   Procedure: L1-2 Left Anterior lateral lumbar interbody fusion;  Surgeon:  Erline Levine, MD;  Location: [place];  Service: Neurosurgery;  Laterality: N/A;  L1-2 Anterior lateral lumbar interbody fusion  . ANTERIOR LUMBAR FUSION N/A 06/02/2016   Procedure: Lumbar five -sacral one Anterior lumbar interbody fusion with Dr. Deitra Mayo; Lumbar one-two Left Anterior lateral lumbar interbody fusion;  Surgeon: Erline Levine, MD;  Location: [facility];  Service: Neurosurgery;  Laterality: N/A;  . APPENDECTOMY    . APPLICATION OF INTRAOPERATIVE CT SCAN N/A 06/05/2016   Procedure: APPLICATION OF INTRAOPERATIVE CT SCAN;  Surgeon: Erline Levine, MD;  Location: [facility];  Service: Neurosurgery;  Laterality: N/A;  . BACK SURGERY     2005, 2006, 2015  . CARDIAC CATHETERIZATION N/A 02/07/2015   Procedure: Left Heart Cath and Coronary Angiography;  Surgeon: Teodoro Spray, MD;  Location: [place] [facility];  Service: Cardiovascular;  Laterality: N/A;  . CHOLECYSTECTOMY    . ESOPHAGOGASTRODUODENOSCOPY (EGD) WITH PROPOFOL N/A 02/03/2016   Procedure: ESOPHAGOGASTRODUODENOSCOPY (EGD) WITH PROPOFOL;  Surgeon: Lucilla Lame, MD;  Location: [facility];  Service: Endoscopy;  Laterality: N/A;  . ESOPHAGOGASTRODUODENOSCOPY (EGD) WITH PROPOFOL N/A 03/02/2016   Procedure: ESOPHAGOGASTRODUODENOSCOPY (EGD) WITH PROPOFOL;  Surgeon: Lucilla Lame, MD;  Location: [facility];  Service: Endoscopy;  Laterality: N/A;  . NISSEN FUNDOPLICATION    . POSTERIOR LUMBAR FUSION 4 LEVEL N/A 06/05/2016   Procedure: Thoracic six to pelvis fixation with Smith-Peterson Thoracic osteotomies at Thoracic ten-Thoracic Twelve with AIRO, Removal hardware lumbar two to lumbar five;  Surgeon: Erline Levine, MD;  Location: [facility];  Service: Neurosurgery;  Laterality: N/A;  . SYMPATHECTOMY     ? improve blood flow to legs  . TONSILECTOMY, ADENOIDECTOMY, BILATERAL MYRINGOTOMY AND TUBES        reports that she has never smoked. She has never used smokeless tobacco. She reports that she does not drink alcohol or use  drugs. Social History   Social History  . Marital status: Married    Spouse name: N/A  . Number of children: N/A  . Years of education: N/A   Occupational History  . Not on file.   Social History Main Topics  . Smoking status: Never Smoker  . Smokeless tobacco: Never Used  . Alcohol use No  . Drug use: No  . Sexual activity: Not on file   Other Topics Concern  . Not on file   Social History Narrative  . No narrative on file   Functional Status Survey:    Allergies  Allergen Reactions  . Atorvastatin Other (See Comments)    Chest pain    . Rosuvastatin Other (See Comments)    Causes chest pain      Pertinent  Health Maintenance Due  Topic Date Due  . DEXA SCAN  07/31/2002  . PNA vac Low Risk Adult (1 of 2 - PCV13) 07/31/2002  . INFLUENZA VACCINE  11/05/2015    Medications: Allergies as of 06/18/2016      Reactions   Atorvastatin Other (See Comments)   Chest pain     Rosuvastatin Other (See Comments)   Causes chest pain        Medication List       Accurate as of 06/18/16 11:59 PM. Always  use your most recent med list.          aspirin EC 81 MG tablet Take 81 mg by mouth at bedtime.   calcium citrate-vitamin D 315-200 MG-UNIT tablet Commonly known as:  CITRACAL+D Take 1 tablet by mouth daily.   cetirizine 10 MG tablet Commonly known as:  ZYRTEC Take 10 mg by mouth at bedtime.   clobetasol ointment 0.05 % Commonly known as:  TEMOVATE APPLY TWICE DAILY AS NEEDED TO SORE AREAS IN MOUTH UNTIL HEALED. AVOID FACE AND SKIN FOLDS.   esomeprazole 20 MG capsule Commonly known as:  NEXIUM Take 1 capsule (20 mg total) by mouth 2 (two) times daily before a meal.   estradiol 1 MG tablet Commonly known as:  ESTRACE Take 1 mg by mouth daily.   fluticasone 50 MCG/ACT nasal spray Commonly known as:  FLONASE USE 2 PUFFS EACH NOSTRIL AT BEDTIME AS NEEDED FOR CONGESTION   methocarbamol 500 MG tablet Commonly known as:  ROBAXIN Take 1 tablet (500 mg  total) by mouth every 6 (six) hours as needed for muscle spasms.   metoprolol succinate 50 MG 24 hr tablet Commonly known as:  TOPROL-XL Take 50 mg by mouth daily.   nystatin powder Commonly known as:  MYCOSTATIN/NYSTOP Apply 1 g topically daily. APPLIED UNDER BREAST DAILY AFTER SHOWER.   OCUVITE EXTRA PO Take 1 tablet by mouth daily.   oxyCODONE 5 MG immediate release tablet Commonly known as:  Oxy IR/ROXICODONE Take 1 tablet (5 mg total) by mouth every 6 (six) hours as needed for severe pain.   pravastatin 80 MG tablet Commonly known as:  PRAVACHOL Take 80 mg by mouth at bedtime.   sennosides-docusate sodium 8.6-50 MG tablet Commonly known as:  SENOKOT-S Take 1 tablet by mouth daily as needed for constipation.   valsartan-hydrochlorothiazide 160-12.5 MG tablet Commonly known as:  DIOVAN-HCT Take 1 tablet by mouth daily.       Review of Systems  Constitutional: Negative for activity change, appetite change, chills, diaphoresis and fever.  HENT: Negative for congestion, sneezing, sore throat, trouble swallowing and voice change.   Respiratory: Negative for apnea, cough, choking, chest tightness, shortness of breath and wheezing.   Cardiovascular: Negative for chest pain, palpitations and leg swelling.  Gastrointestinal: Negative for abdominal distention, abdominal pain, constipation, diarrhea and nausea.  Genitourinary: Negative for difficulty urinating, dysuria, frequency and urgency.  Musculoskeletal: Positive for arthralgias (typical arthritis) and back pain. Negative for gait problem and myalgias.  Skin: Negative for color change, pallor, rash and wound.  Neurological: Negative for dizziness, tremors, syncope, speech difficulty, weakness, numbness and headaches.  Psychiatric/Behavioral: Negative for agitation and behavioral problems.  All other systems reviewed and are negative.   Vitals:   06/18/16 0600  BP: (!) 122/59  Pulse: 96  Resp: 16  Temp: 97.3 F (36.3  C)  SpO2: 97%   There is no height or weight on file to calculate BMI. Physical Exam  Constitutional: She is oriented to person, place, and time. Vital signs are normal. She appears well-developed and well-nourished. She is active and cooperative. She does not appear ill. No distress.  HENT:  Head: Normocephalic and atraumatic.  Mouth/Throat: Uvula is midline, oropharynx is clear and moist and mucous membranes are normal. Mucous membranes are not pale, not dry and not cyanotic.  Eyes: Conjunctivae, EOM and lids are normal. Pupils are equal, round, and reactive to light.  Neck: Trachea normal, normal range of motion and full passive range of motion without pain.  Neck supple. No JVD present. No tracheal deviation, no edema and no erythema present. No thyromegaly present.  Cardiovascular: Normal rate, regular rhythm, normal heart sounds, intact distal pulses and normal pulses.  Exam reveals no gallop, no distant heart sounds and no friction rub.   No murmur heard. Pulmonary/Chest: Effort normal and breath sounds normal. No accessory muscle usage. No respiratory distress. She has no wheezes. She has no rales. She exhibits no tenderness.  Abdominal: Normal appearance and bowel sounds are normal. She exhibits no distension and no ascites. There is no tenderness.  Musculoskeletal: She exhibits no edema.       Lumbar back: She exhibits decreased range of motion, tenderness and laceration (multiple incisions).  Expected osteoarthritis, stiffness; calves soft, supple. Negative Homan's sign  Neurological: She is alert and oriented to person, place, and time. She has normal strength.  Skin: Skin is warm and dry. Laceration (multiple incisions- well approximated) noted. She is not diaphoretic. No cyanosis. No pallor. Nails show no clubbing.     Psychiatric: She has a normal mood and affect. Her speech is normal and behavior is normal. Judgment and thought content normal. Cognition and memory are normal.   Nursing note and vitals reviewed.   Labs reviewed: Basic Metabolic Panel:  Recent Labs  07/03/15 1219 05/26/16 1137  NA  --  134*  K  --  4.0  CL  --  96*  CO2  --  28  GLUCOSE  --  84  BUN  --  7  CREATININE  --  0.78  CALCIUM  --  10.1  MG 1.5*  --    Liver Function Tests: No results for input(s): AST, ALT, ALKPHOS, BILITOT, PROT, ALBUMIN in the last 8760 hours. No results for input(s): LIPASE, AMYLASE in the last 8760 hours. No results for input(s): AMMONIA in the last 8760 hours. CBC:  Recent Labs  05/26/16 1137  WBC 7.8  HGB 10.7*  HCT 35.7*  MCV 75.5*  PLT 252   Cardiac Enzymes: No results for input(s): CKTOTAL, CKMB, CKMBINDEX, TROPONINI in the last 8760 hours. BNP: Invalid input(s): POCBNP CBG: No results for input(s): GLUCAP in the last 8760 hours.  Procedures and Imaging Studies During Stay: Dg Lumbar Spine 2-3 Views  Result Date: 06/02/2016 CLINICAL DATA:  Lumbar disc disease. EXAM: DG C-ARM 61-120 MIN; LUMBAR SPINE - 2-3 VIEW COMPARISON:  None. FINDINGS: AP and lateral C-arm images demonstrate the patient undergoing anterior fusion at L5-S1 and interbody fusion at L1-2. Hardware devices appear in good position in the AP and lateral projections. IMPRESSION: Anterior interbody fusion performed at L5-S1. Interbody fusion performed at L1-2. Electronically Signed   By: Lorriane Shire M.D.   On: 06/02/2016 12:03   Dg Chest Port 1 View  Result Date: 06/05/2016 CLINICAL DATA:  Central line placement EXAM: PORTABLE CHEST 1 VIEW COMPARISON:  01/28/2015 FINDINGS: Cardiomediastinal silhouette is stable. No infiltrate or pulmonary edema. Metallic fixation rods and transpedicular screws noted lower thoracic and lumbar spine. No infiltrate or pulmonary edema. Mild elevation of the right hemidiaphragm. There is right IJ central line with tip in cavoatrial junction. No evidence of pneumothorax. IMPRESSION: Right IJ central line with tip in cavoatrial junction. No evidence  of pneumothorax. Electronically Signed   By: Lahoma Crocker M.D.   On: 06/05/2016 17:15   Dg C-arm 61-120 Min  Result Date: 06/02/2016 CLINICAL DATA:  Lumbar disc disease. EXAM: DG C-ARM 61-120 MIN; LUMBAR SPINE - 2-3 VIEW COMPARISON:  None. FINDINGS: AP and lateral  C-arm images demonstrate the patient undergoing anterior fusion at L5-S1 and interbody fusion at L1-2. Hardware devices appear in good position in the AP and lateral projections. IMPRESSION: Anterior interbody fusion performed at L5-S1. Interbody fusion performed at L1-2. Electronically Signed   By: Lorriane Shire M.D.   On: 06/02/2016 12:03   Dg Scoliosis Eval Complete Spine 2 Or 3 Views  Addendum Date: 06/09/2016   ADDENDUM REPORT: 06/09/2016 12:10 ADDENDUM: Additional images were later added 2 this same accession number. The lumbosacral junction and pelvis are more fully visualized on these additional images. The instrumentation in this region also appears intact. Four degrees left convex curvature from from T12-L5, similar to the 06/03/2016 study. Electronically Signed   By: Logan Bores M.D.   On: 06/09/2016 12:10   Result Date: 06/09/2016 CLINICAL DATA:  Scoliosis. EXAM: DG SCOLIOSIS EVAL COMPLETE SPINE 2-3V COMPARISON:  06/03/2016 scoliosis radiographs. 06/02/2016 intraoperative lumbar spine fluoroscopic images. FINDINGS: Numbering as on the prior radiographs. There are no ribs at T12. Since the prior radiographs, the posterior spinal fusion has been extended. The fusion now extends from T6 to the sacrum/ pelvis. Bilateral pedicle screws are present at each level with the exception of a unilateral right-sided screw at L4. Interbody fusion devices are present at L1-2, L3-4, and L4-5. Sequelae of anterior fusion are again identified at L5-S1. There is minimal fused anterolisthesis of L4 on L5. No significant scoliosis is apparent in the coronal plane. Surgical clips are present in the abdomen/ retroperitoneum. Mild gaseous distention of small  and large bowel loops is partially visualized. A right jugular catheter terminates over the lower SVC. IMPRESSION: Interval extension of posterior spinal fusion as above without evidence of acute complication. Electronically Signed: By: Logan Bores M.D. On: 06/07/2016 09:10   Dg Scoliosis Eval Complete Spine 2 Or 3 Views  Result Date: 06/03/2016 CLINICAL DATA:  Scoliosis/kyphosis status post surgery. EXAM: DG SCOLIOSIS EVAL COMPLETE SPINE 2-3V COMPARISON:  None. FINDINGS: Full-length PA radiograph of the spine is provided. There is mild levocurvature of the lumbar spine measuring 5 degrees. There is no other significant curvature greater than 5 degrees. There are no intrinsic vertebral anomalies. No significant pelvic tilt. Risser grade is 5. Both hips appear normal. Soft tissues are unremarkable. Posterior spinal fusion from L3 through L5. Anterior interbody fusion at L5-S1. Interbody disc replacement at L2-3. IMPRESSION: Mild levocurvature of the lumbar spine measuring 5 degrees. Electronically Signed   By: Kathreen Devoid   On: 06/03/2016 11:46   Dg Or Local Abdomen  Result Date: 06/02/2016 CLINICAL DATA:  Instrument count. EXAM: OR LOCAL ABDOMEN COMPARISON:  None. FINDINGS: Patient is status post anterior fusion of the lumbosacral junction. Posterior fusion changes partially imaged in the lumbar spine. No unexpected radiopaque foreign body. IMPRESSION: No unexpected radiopaque foreign body. Electronically Signed   By: Rolm Baptise M.D.   On: 06/02/2016 09:51    Assessment/Plan:   1. Kyphoscoliosis and scoliosis 2. Status post lumbar spine operation  Continue PT/OT  Continue exercises as taught by PT/OT  Incision care per protocol  Continue Oxycodone 5 mg po Q 6 hours prn, pain; #20, no refill  Continue Methocarbamol 500 mg po Q 6 hours prn- spasms, cramps Continue complementary therapies including: Ice pack to site QID and prn Diversional activities Repositioning Q2 hours and  prn Scheduled Tylenol 650 mg po QID  Follow up with PCP asap for continuity of care  Patient is being discharged with the following home health services:  HHPT/OT  Patient is being discharged with the following durable medical equipment:  none  Patient has been advised to f/u with their PCP in 1-2 weeks to bring them up to date on their rehab stay.  Social services at facility was responsible for arranging this appointment.  Pt was provided with a 30 day supply of prescriptions for medications and refills must be obtained from their PCP.  For controlled substances, a more limited supply may be provided adequate until PCP appointment only.  Future labs/tests needed:  Per pcp  Family/ staff Communication:   Total Time:  Documentation:  Face to Face:  Family/Phone:  Vikki Ports, NP-C [facility] [address], [place] [address] Cell Phone (Mon-Fri 8am-5pm):  [phone] On Call:  [phone] & follow prompts after 5pm & weekends Office Phone:  [phone] Office Fax:  [phone]

## 2016-11-26 NOTE — Addendum Note (Signed)
Addendum  created 11/26/16 1123 by Benjamen Koelling, MD   Sign clinical note    

## 2016-12-24 ENCOUNTER — Other Ambulatory Visit: Payer: Self-pay | Admitting: Gastroenterology

## 2017-04-14 ENCOUNTER — Other Ambulatory Visit: Payer: Self-pay | Admitting: Gastroenterology

## 2017-05-07 ENCOUNTER — Other Ambulatory Visit: Payer: Self-pay | Admitting: Internal Medicine

## 2017-05-07 DIAGNOSIS — R270 Ataxia, unspecified: Secondary | ICD-10-CM

## 2017-05-10 ENCOUNTER — Other Ambulatory Visit: Payer: Self-pay | Admitting: Internal Medicine

## 2017-05-10 DIAGNOSIS — Z1231 Encounter for screening mammogram for malignant neoplasm of breast: Secondary | ICD-10-CM

## 2017-05-25 ENCOUNTER — Ambulatory Visit
Admission: RE | Admit: 2017-05-25 | Discharge: 2017-05-25 | Disposition: A | Discharge status: Home / Self Care | Payer: Medicare Other | Source: Ambulatory Visit | Attending: Internal Medicine | Admitting: Internal Medicine

## 2017-05-25 DIAGNOSIS — Z1231 Encounter for screening mammogram for malignant neoplasm of breast: Secondary | ICD-10-CM

## 2017-05-25 DIAGNOSIS — R55 Syncope and collapse: Secondary | ICD-10-CM | POA: Insufficient documentation

## 2017-05-25 DIAGNOSIS — R270 Ataxia, unspecified: Secondary | ICD-10-CM | POA: Insufficient documentation

## 2017-09-08 ENCOUNTER — Ambulatory Visit (INDEPENDENT_AMBULATORY_CARE_PROVIDER_SITE_OTHER): Payer: Medicare Other | Admitting: Vascular Surgery

## 2017-09-08 ENCOUNTER — Encounter (INDEPENDENT_AMBULATORY_CARE_PROVIDER_SITE_OTHER): Payer: Self-pay | Admitting: Vascular Surgery

## 2017-09-08 VITALS — BP 135/58 | HR 50 | Resp 12 | Ht 62.5 in | Wt 168.0 lb

## 2017-09-08 DIAGNOSIS — M4724 Other spondylosis with radiculopathy, thoracic region: Secondary | ICD-10-CM | POA: Diagnosis not present

## 2017-09-08 DIAGNOSIS — Z981 Arthrodesis status: Secondary | ICD-10-CM | POA: Diagnosis not present

## 2017-09-08 DIAGNOSIS — R29898 Other symptoms and signs involving the musculoskeletal system: Secondary | ICD-10-CM | POA: Insufficient documentation

## 2017-09-08 NOTE — Progress Notes (Signed)
Subjective:    Patient ID: Allison Mclean, female    DOB: 03-08-1936, 82 y.o.   MRN: [id number] Chief Complaint  Patient presents with  . New Patient (Initial Visit)    Right leg pain and stiffness   Presents as a new patient self-referred for evaluation of bilateral lower extremity weakness.  The patient endorses a history of progressively worsening weakness to the bilateral legs over the last 6 months.  The patient has an extensive history of cervical thoracic and lumbar spine stenosis/surgeries.  The patient recently had a fall.  The patient states that she was gardening went to stand up her legs became weak and fell.  She notes that her back was hurting her prior to the fall.  The patient denies any claudication-like symptoms, rest pain or ulceration to the bilateral lower extremity.  The patient denies any amaurosis fugax or neurological deficits.  The patient notes that her weakness and some discomfort tends to occur at night and without activity.  Patient denies any fever, nausea vomiting.  Review of Systems  Constitutional: Negative.   HENT: Negative.   Eyes: Negative.   Respiratory: Negative.   Cardiovascular: Negative.   Gastrointestinal: Negative.   Endocrine: Negative.   Genitourinary: Negative.   Musculoskeletal: Positive for back pain.  Skin: Negative.   Allergic/Immunologic: Negative.   Neurological: Positive for weakness.  Hematological: Negative.   Psychiatric/Behavioral: Negative.       Objective:   Physical Exam  Constitutional: She is oriented to person, place, and time. She appears well-developed and well-nourished. No distress.  HENT:  Head: Normocephalic and atraumatic.  Right Ear: External ear normal.  Left Ear: External ear normal.  Eyes: Pupils are equal, round, and reactive to light. Conjunctivae and EOM are normal.  Neck: Normal range of motion.  Carotid bruits noted on exam  Cardiovascular: Normal rate, regular rhythm, normal heart sounds and intact  distal pulses.  Pulses:      Radial pulses are 2+ on the right side, and 2+ on the left side.       Dorsalis pedis pulses are 2+ on the right side, and 2+ on the left side.       Posterior tibial pulses are 2+ on the right side, and 2+ on the left side.  Pulmonary/Chest: Effort normal and breath sounds normal.  Musculoskeletal: Normal range of motion. She exhibits no edema.  Neurological: She is alert and oriented to person, place, and time.  Skin: Skin is warm and dry. She is not diaphoretic.  Psychiatric: She has a normal mood and affect. Her behavior is normal. Judgment and thought content normal.  Vitals reviewed.  BP (!) 135/58 (BP Location: Right Arm, Patient Position: Sitting)   Pulse (!) 50   Resp 12   Ht 5' 2.5" (1.588 m)   Wt 168 lb (76.2 kg)   BMI 30.24 kg/m   Past Medical History:  Diagnosis Date  . Abnormal MRI, thoracic spine 07/02/2015   IMPRESSION: 1. 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 Subsequent moderate to severe adjacent segment facet arthropathy. 2. Widespread thoracic spine disc bulging. Occasional small superimposed disc protrusions (T8-9, T11-12). 3. Subsequent multifactorial mild thoracic spinal stenosis T7-8, T8-9, T9-10, T10-11, T11-12, and T12-L1. Up to mild spi  . Acid reflux 11/22/2014  . 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 Allergy   . Chronic thoracic radicular pain (T5/T6) (Left) 07/02/2015  . Essential (primary) hypertension 12/23/2013  . GERD (gastroesophageal reflux  disease)   . Heart murmur   . History of fusion of cervical spine 07/02/2015  . History of hiatal hernia    s/p  . History of kidney stones   . History of lumbar fusion (12/19/2013) (posterior lumbar fusion from L2-L5) 07/02/2015   Pedicle screws and posterior hardware at L5 L4-L3 and L2. Neil disc block at the L4-5 disc space. Slight anterior subluxation of L4 on L5. Different configuration and this had L3-4. Degenerative changes of the  lumbar disc spaces. L5-S1 disc space well-maintained .   Marland Kitchen HLD (hyperlipidemia) 11/07/2013  . Hyperlipidemia   . Hypertension   . Infection    abcess in left upper tooth   . Jackhammer esophagus   . Nerve root pain 07/02/2015   Thoracic pain is likely to be secondary to severe left T5 foraminal stenosis.   . Neuropathic pain 08/05/2015  . Opiate use (10 MME/day) 07/02/2015  . Osteopenia, senile 07/02/2015  . Supraventricular tachycardia ([place]) 11/07/2013  . Thoracic central spinal stenosis (T7-8, T8-9, T9-10, T10-11, T11-12, and T12-L1) 07/02/2015  . Thoracic facet syndrome (Left) 07/02/2015  . Thoracic foraminal stenosis (Moderate to severe at: Right T5; Bilateral T9; Left T11) 07/02/2015  . Thoracic spondylosis with radiculopathy (Left) 07/09/2015  . Wears dentures    partial lower   Social History   Socioeconomic History  . Marital status: Married    Spouse name: Not on file  . Number of children: Not on file  . Years of education: Not on file  . Highest education level: Not on file  Occupational History  . Not on file  Social Needs  . Financial resource strain: Not on file  . Food insecurity:    Worry: Not on file    Inability: Not on file  . Transportation needs:    Medical: Not on file    Non-medical: Not on file  Tobacco Use  . Smoking status: Never Smoker  . Smokeless tobacco: Never Used  Substance and Sexual Activity  . Alcohol use: No  . Drug use: No  . Sexual activity: Not on file  Lifestyle  . Physical activity:    Days per week: Not on file    Minutes per session: Not on file  . Stress: Not on file  Relationships  . Social connections:    Talks on phone: Not on file    Gets together: Not on file    Attends religious service: Not on file    Active member of club or organization: Not on file    Attends meetings of clubs or organizations: Not on file    Relationship status: Not on file  . Intimate partner violence:    Fear of current or ex partner: Not on file     Emotionally abused: Not on file    Physically abused: Not on file    Forced sexual activity: Not on file  Other Topics Concern  . Not on file  Social History Narrative  . Not on file   Past Surgical History:  Procedure Laterality Date  . ABDOMINAL EXPOSURE N/A 06/02/2016   Procedure: ABDOMINAL EXPOSURE;  Surgeon: Angelia Mould, MD;  Location: [facility];  Service: Vascular;  Laterality: N/A;  . ABDOMINAL HYSTERECTOMY    . ANTERIOR LAT LUMBAR FUSION N/A 06/02/2016   Procedure: L1-2 Left Anterior lateral lumbar interbody fusion;  Surgeon: Erline Levine, MD;  Location: [facility];  Service: Neurosurgery;  Laterality: N/A;  L1-2 Anterior lateral lumbar interbody fusion  . ANTERIOR LUMBAR  FUSION N/A 06/02/2016   Procedure: Lumbar five -sacral one Anterior lumbar interbody fusion with Dr. Deitra Mayo; Lumbar one-two Left Anterior lateral lumbar interbody fusion;  Surgeon: Erline Levine, MD;  Location: [facility];  Service: Neurosurgery;  Laterality: N/A;  . APPENDECTOMY    . APPLICATION OF INTRAOPERATIVE CT SCAN N/A 06/05/2016   Procedure: APPLICATION OF INTRAOPERATIVE CT SCAN;  Surgeon: Erline Levine, MD;  Location: [facility];  Service: Neurosurgery;  Laterality: N/A;  . BACK SURGERY     2005, 2006, 2015  . CARDIAC CATHETERIZATION N/A 02/07/2015   Procedure: Left Heart Cath and Coronary Angiography;  Surgeon: Teodoro Spray, MD;  Location: [facility];  Service: Cardiovascular;  Laterality: N/A;  . CHOLECYSTECTOMY    . ESOPHAGOGASTRODUODENOSCOPY (EGD) WITH PROPOFOL N/A 02/03/2016   Procedure: ESOPHAGOGASTRODUODENOSCOPY (EGD) WITH PROPOFOL;  Surgeon: Lucilla Lame, MD;  Location: [facility];  Service: Endoscopy;  Laterality: N/A;  . ESOPHAGOGASTRODUODENOSCOPY (EGD) WITH PROPOFOL N/A 03/02/2016   Procedure: ESOPHAGOGASTRODUODENOSCOPY (EGD) WITH PROPOFOL;  Surgeon: Lucilla Lame, MD;  Location: [facility];  Service: Endoscopy;  Laterality: N/A;  . NISSEN FUNDOPLICATION    .  POSTERIOR LUMBAR FUSION 4 LEVEL N/A 06/05/2016   Procedure: Thoracic six to pelvis fixation with Smith-Peterson Thoracic osteotomies at Thoracic ten-Thoracic Twelve with AIRO, Removal hardware lumbar two to lumbar five;  Surgeon: Erline Levine, MD;  Location: [facility];  Service: Neurosurgery;  Laterality: N/A;  . SYMPATHECTOMY     ? improve blood flow to legs  . TONSILECTOMY, ADENOIDECTOMY, BILATERAL MYRINGOTOMY AND TUBES     Family History  Problem Relation Age of Onset  . Diabetes Mother   . Arthritis Mother   . Heart disease Mother   . Hypertension Mother   . Diabetes Father   . Asthma Father   . Heart disease Father   . Hypertension Father   . Stroke Father   . Breast cancer Neg Hx    Allergies  Allergen Reactions  . Atorvastatin Other (See Comments)    Chest pain    . Rosuvastatin Other (See Comments)    Causes chest pain        Assessment & Plan:  Presents as a new patient self-referred for evaluation of bilateral lower extremity weakness.  The patient endorses a history of progressively worsening weakness to the bilateral legs over the last 6 months.  The patient has an extensive history of cervical thoracic and lumbar spine stenosis/surgeries.  The patient recently had a fall.  The patient states that she was gardening went to stand up her legs became weak and fell.  She notes that her back was hurting her prior to the fall.  The patient denies any claudication-like symptoms, rest pain or ulceration to the bilateral lower extremity.  The patient denies any amaurosis fugax or neurological deficits.  The patient notes that her weakness and some discomfort tends to occur at night and without activity.  Patient denies any fever, nausea vomiting.  1. 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 The patient does have chronic pain issues due to this. I have recommended  that the patient follow-up with her neurosurgeon for further assessment of her spine. I have also encouraged the patient to follow-up with her primary care physician or cardiologist to assess her for any cardiac issues that may be contributing The patient has palpable pedal pulses and no carotid bruits noted on exam  however I will bring her back to undergo an ABI and carotid duplex to rule out any vascular issues that could be contributing to her lower extremity weakness however I do not feel that it will be vascular in origin. I have discussed with the patient at length the risk factors for and pathogenesis of atherosclerotic disease and encouraged a healthy diet, regular exercise regimen and blood pressure / glucose control.  The patient was encouraged to call the office in the interim if he experiences any claudication like symptoms, rest pain or ulcers to his feet / toes.  2. History of lumbar fusion (12/19/2013) (posterior lumbar fusion from L2-L5) - Stable As above  3. Thoracic spondylosis with radiculopathy (Left) - Stable As above  4. Weakness of both lower extremities - New As above  - US Carotid Bilateral; Future - US ARTERIAL ABI (SCREENING LOWER EXTREMITY); Future  Current Outpatient Medications on File Prior to Visit  Medication Sig Dispense Refill  . aspirin EC 81 MG tablet Take 81 mg by mouth at bedtime.     . Calcium Carbonate-Vitamin D 600-200 MG-UNIT CAPS Take by mouth.    . clobetasol ointment (TEMOVATE) 0.05 % APPLY TWICE DAILY AS NEEDED TO SORE AREAS IN MOUTH UNTIL HEALED. AVOID FACE AND SKIN FOLDS.  1  . fluticasone (FLONASE) 50 MCG/ACT nasal spray USE 2 PUFFS EACH NOSTRIL AT BEDTIME AS NEEDED FOR CONGESTION    . metoprolol succinate (TOPROL-XL) 50 MG 24 hr tablet Take 50 mg by mouth daily.  3  . Multiple Vitamins-Minerals (OCUVITE EXTRA PO) Take 1 tablet by mouth daily.     . potassium chloride SA (K-DUR,KLOR-CON) 20 MEQ tablet Take by mouth.    . pravastatin  (PRAVACHOL) 80 MG tablet Take 80 mg by mouth at bedtime.  3  . sennosides-docusate sodium (SENOKOT-S) 8.6-50 MG tablet Take 1 tablet by mouth daily as needed for constipation.    . valsartan-hydrochlorothiazide (DIOVAN-HCT) 160-12.5 MG per tablet Take 1 tablet by mouth daily.  3  . amiodarone (PACERONE) 200 MG tablet tAtKE 2 TABLETS TWICE DAILY FOR 1 MONTH THEN 1 TABLET DAILY    . amLODipine (NORVASC) 2.5 MG tablet Take by mouth.    . calcium citrate-vitamin D (CITRACAL+D) 315-200 MG-UNIT tablet Take 1 tablet by mouth daily.     . cetirizine (ZYRTEC) 10 MG tablet Take 10 mg by mouth at bedtime.    Marland Kitchen esomeprazole (NEXIUM) 20 MG capsule Take 1 capsule (20 mg total) by mouth 2 (two) times daily before a meal. (Patient not taking: Reported on 09/08/2017) 60 capsule 11  . estradiol (ESTRACE) 1 MG tablet Take 1 mg by mouth daily.     . hydrochlorothiazide (HYDRODIURIL) 25 MG tablet hydrochlorothiazide 25 mg tablet    . meloxicam (MOBIC) 15 MG tablet Take by mouth.    . methocarbamol (ROBAXIN) 500 MG tablet Take 1 tablet (500 mg total) by mouth every 6 (six) hours as needed for muscle spasms. (Patient not taking: Reported on 09/08/2017) 60 tablet 1  . oxyCODONE (OXY IR/ROXICODONE) 5 MG immediate release tablet Take 1 tablet (5 mg total) by mouth every 6 (six) hours as needed for severe pain. 120 tablet 0   No current facility-administered medications on file prior to visit.    There are no Patient Instructions on file for this visit. No follow-ups on file.  Rumaysa Sabatino A Lakia Gritton, PA-C

## 2017-09-10 ENCOUNTER — Other Ambulatory Visit: Payer: Self-pay

## 2017-09-10 ENCOUNTER — Emergency Department: Payer: Medicare Other

## 2017-09-10 ENCOUNTER — Emergency Department
Admission: EM | Admit: 2017-09-10 | Discharge: 2017-09-10 | Disposition: A | Discharge status: Home / Self Care | Payer: Medicare Other | Attending: Emergency Medicine | Admitting: Emergency Medicine

## 2017-09-10 DIAGNOSIS — Y929 Unspecified place or not applicable: Secondary | ICD-10-CM | POA: Insufficient documentation

## 2017-09-10 DIAGNOSIS — S0990XA Unspecified injury of head, initial encounter: Secondary | ICD-10-CM | POA: Diagnosis present

## 2017-09-10 DIAGNOSIS — Y93H2 Activity, gardening and landscaping: Secondary | ICD-10-CM | POA: Insufficient documentation

## 2017-09-10 DIAGNOSIS — Y999 Unspecified external cause status: Secondary | ICD-10-CM | POA: Insufficient documentation

## 2017-09-10 DIAGNOSIS — Z7982 Long term (current) use of aspirin: Secondary | ICD-10-CM | POA: Insufficient documentation

## 2017-09-10 DIAGNOSIS — Z23 Encounter for immunization: Secondary | ICD-10-CM | POA: Diagnosis not present

## 2017-09-10 DIAGNOSIS — S0083XA Contusion of other part of head, initial encounter: Secondary | ICD-10-CM | POA: Insufficient documentation

## 2017-09-10 DIAGNOSIS — W010XXA Fall on same level from slipping, tripping and stumbling without subsequent striking against object, initial encounter: Secondary | ICD-10-CM | POA: Insufficient documentation

## 2017-09-10 DIAGNOSIS — S060X0A Concussion without loss of consciousness, initial encounter: Secondary | ICD-10-CM | POA: Insufficient documentation

## 2017-09-10 DIAGNOSIS — I10 Essential (primary) hypertension: Secondary | ICD-10-CM | POA: Diagnosis not present

## 2017-09-10 LAB — CBC WITH DIFFERENTIAL/PLATELET
Basophils Absolute: 0.1 10*3/uL (ref 0–0.1)
Basophils Relative: 1 %
Eosinophils Absolute: 0.2 10*3/uL (ref 0–0.7)
Eosinophils Relative: 5 %
HCT: 37 % (ref 35.0–47.0)
Hemoglobin: 12 g/dL (ref 12.0–16.0)
Lymphocytes Relative: 35 %
Lymphs Abs: 1.7 10*3/uL (ref 1.0–3.6)
MCH: 25.9 pg — ABNORMAL LOW (ref 26.0–34.0)
MCHC: 32.4 g/dL (ref 32.0–36.0)
MCV: 80 fL (ref 80.0–100.0)
Monocytes Absolute: 0.6 10*3/uL (ref 0.2–0.9)
Monocytes Relative: 12 %
Neutro Abs: 2.4 10*3/uL (ref 1.4–6.5)
Neutrophils Relative %: 47 %
Platelets: 251 10*3/uL (ref 150–440)
RBC: 4.62 MIL/uL (ref 3.80–5.20)
RDW: 17.7 % — ABNORMAL HIGH (ref 11.5–14.5)
WBC: 5 10*3/uL (ref 3.6–11.0)

## 2017-09-10 LAB — COMPREHENSIVE METABOLIC PANEL
ALT: 14 U/L (ref 14–54)
AST: 23 U/L (ref 15–41)
Albumin: 4.1 g/dL (ref 3.5–5.0)
Alkaline Phosphatase: 69 U/L (ref 38–126)
Anion gap: 11 (ref 5–15)
BILIRUBIN TOTAL: 0.3 mg/dL (ref 0.3–1.2)
BUN: 16 mg/dL (ref 6–20)
CO2: 28 mmol/L (ref 22–32)
CREATININE: 0.9 mg/dL (ref 0.44–1.00)
Calcium: 9.6 mg/dL (ref 8.9–10.3)
Chloride: 96 mmol/L — ABNORMAL LOW (ref 101–111)
GFR calc Af Amer: >60 mL/min (ref >60)
GFR, EST NON AFRICAN AMERICAN: 59 mL/min — AB (ref >60)
Glucose, Bld: 107 mg/dL — ABNORMAL HIGH (ref 65–99)
Potassium: 3.5 mmol/L (ref 3.5–5.1)
Sodium: 135 mmol/L (ref 135–145)
TOTAL PROTEIN: 7 g/dL (ref 6.5–8.1)

## 2017-09-10 LAB — TROPONIN I

## 2017-09-10 LAB — PROTIME-INR
INR: 0.93
Prothrombin Time: 12.4 seconds (ref 11.4–15.2)

## 2017-09-10 MED ORDER — TETANUS-DIPHTH-ACELL PERTUSSIS 5-2.5-18.5 LF-MCG/0.5 IM SUSP
0.5000 mL | Freq: Once | INTRAMUSCULAR | Status: AC
  Administered 2017-09-10: 0.5 mL via INTRAMUSCULAR
  Filled 2017-09-10: qty 0.5

## 2017-09-10 NOTE — ED Notes (Signed)
Spoke with pt about wait times and what to expect next. Advised pt that I am available for further questions if needed.  

## 2017-09-10 NOTE — ED Notes (Addendum)
Pt states she fell around 1900 on 09/07/2017. Pt states she believes she did have LOC. Pt states she has a headache with nausea. Pt with dark purple bruising noted to nasal bridge, around left eye, to medial right eye. No drainage noted from nose or ears. Yellow bruising noted around purple present to bilateral eyes and nasal bridge. Pt states she feels like she has had a visual change. Pt states "I normally wear glasses, I think I couldn't read some of the signs that I normally would be able to with my glasses." pt ambulatory without difficulty.

## 2017-09-10 NOTE — Discharge Instructions (Addendum)
Your CT scan of the head, neck, and face was unremarkable today. We gave you a tetanus vaccination.

## 2017-09-10 NOTE — ED Provider Notes (Signed)
[facility] Emergency Department Provider Note  ____________________________________________  Time seen: Approximately 8:15 PM  I have reviewed the triage vital signs and the nursing notes.   HISTORY  Chief Complaint Fall and Headache    HPI JOSELLE DEEDS is a 82 y.o. female we will history of kidney stones hypertension and acid reflux who reports that she was doing some gardening 3 days ago and when she stood up to go inside she got weak and then fell forward. Denies any preceding chest pain shortness of breath back pain or thunderclap headache. No paresthesias or weakness. She hit her face on the sidewalk. She's had gradually worsening headache since then, nausea today.   she has been able to stand and bear weight since then. No other pain complaints.   Past Medical History:  Diagnosis Date  . Abnormal MRI, thoracic spine 07/02/2015   IMPRESSION: 1. 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 Subsequent moderate to severe adjacent segment facet arthropathy. 2. Widespread thoracic spine disc bulging. Occasional small superimposed disc protrusions (T8-9, T11-12). 3. Subsequent multifactorial mild thoracic spinal stenosis T7-8, T8-9, T9-10, T10-11, T11-12, and T12-L1. Up to mild spi  . Acid reflux 11/22/2014  . 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 Allergy   . Chronic thoracic radicular pain (T5/T6) (Left) 07/02/2015  . Essential (primary) hypertension 12/23/2013  . GERD (gastroesophageal reflux disease)   . Heart murmur   . History of fusion of cervical spine 07/02/2015  . History of hiatal hernia    s/p  . History of kidney stones   . History of lumbar fusion (12/19/2013) (posterior lumbar fusion from L2-L5) 07/02/2015   Pedicle screws and posterior hardware at L5 L4-L3 and L2. Neil disc block at the L4-5 disc space. Slight anterior subluxation of L4 on L5. Different configuration and this had L3-4. Degenerative changes  of the lumbar disc spaces. L5-S1 disc space well-maintained .   Marland Kitchen HLD (hyperlipidemia) 11/07/2013  . Hyperlipidemia   . Hypertension   . Infection    abcess in left upper tooth   . [place] esophagus   . Nerve root pain 07/02/2015   Thoracic pain is likely to be secondary to severe left T5 foraminal stenosis.   . Neuropathic pain 08/05/2015  . Opiate use (10 MME/day) 07/02/2015  . Osteopenia, senile 07/02/2015  . Supraventricular tachycardia ([place]) 11/07/2013  . Thoracic central spinal stenosis (T7-8, T8-9, T9-10, T10-11, T11-12, and T12-L1) 07/02/2015  . Thoracic facet syndrome (Left) 07/02/2015  . Thoracic foraminal stenosis (Moderate to severe at: Right T5; Bilateral T9; Left T11) 07/02/2015  . Thoracic spondylosis with radiculopathy (Left) 07/09/2015  . Wears dentures    partial lower     Patient Active Problem List   Diagnosis Date Noted  . Weakness of both lower extremities 09/08/2017  . Kyphoscoliosis and scoliosis 06/02/2016  . Obstruction of duodenum   . Abnormal findings-gastrointestinal tract   . Stricture, duodenum   . Musculoskeletal pain 08/05/2015  . Neuropathic pain 08/05/2015  . Hypomagnesemia 07/10/2015  . Thoracic spondylosis with radiculopathy (Left) 07/09/2015  . Chronic pain 07/02/2015  . Long term current use of opiate analgesic 07/02/2015  . Long term prescription opiate use 07/02/2015  . Opiate use (10 MME/day) 07/02/2015  . Encounter for therapeutic drug level monitoring 07/02/2015  . Encounter for pain management planning 07/02/2015  . Osteopenia, senile 07/02/2015  . Abnormal MRI, thoracic spine 07/02/2015  . Thoracic central spinal stenosis (T7-8, T8-9, T9-10, T10-11, T11-12,  and T12-L1) 07/02/2015  . Thoracic foraminal stenosis (Moderate to severe at: Right T5; Bilateral T9; Left T11) 07/02/2015  . Chronic thoracic radicular pain (T5/T6) (Left) 07/02/2015  . History of lumbar fusion (12/19/2013) (posterior lumbar fusion from L2-L5) 07/02/2015  . 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 Thoracic facet syndrome (Left) 07/02/2015  . History of fusion of cervical spine 07/02/2015  . Postoperative back pain 07/02/2015  . Nerve root pain 07/02/2015  . Spinal stenosis of thoracic region 07/02/2015  . Status post lumbar spine operation 07/02/2015  . Atypical chest pain 02/05/2015  . Cough 11/26/2014  . Acid reflux 11/22/2014  . 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 Essential (primary) hypertension 12/23/2013  . Acquired spondylolisthesis 12/19/2013  . HLD (hyperlipidemia) 11/07/2013  . Supraventricular tachycardia ([place]) 11/07/2013  . BP (high blood pressure) 11/07/2013     Past Surgical History:  Procedure Laterality Date  . ABDOMINAL EXPOSURE N/A 06/02/2016   Procedure: ABDOMINAL EXPOSURE;  Surgeon: Angelia Mould, MD;  Location: [place];  Service: Vascular;  Laterality: N/A;  . ABDOMINAL HYSTERECTOMY    . ANTERIOR LAT LUMBAR FUSION N/A 06/02/2016   Procedure: L1-2 Left Anterior lateral lumbar interbody fusion;  Surgeon: Erline Levine, MD;  Location: [place];  Service: Neurosurgery;  Laterality: N/A;  L1-2 Anterior lateral lumbar interbody fusion  . ANTERIOR LUMBAR FUSION N/A 06/02/2016   Procedure: Lumbar five -sacral one Anterior lumbar interbody fusion with Dr. Deitra Mayo; Lumbar one-two Left Anterior lateral lumbar interbody fusion;  Surgeon: Erline Levine, MD;  Location: [place];  Service: Neurosurgery;  Laterality: N/A;  . APPENDECTOMY    . APPLICATION OF INTRAOPERATIVE CT SCAN N/A 06/05/2016   Procedure: APPLICATION OF INTRAOPERATIVE CT SCAN;  Surgeon: Erline Levine, MD;  Location: [place];  Service: Neurosurgery;  Laterality: N/A;  . BACK SURGERY     2005, 2006, 2015  . CARDIAC CATHETERIZATION N/A 02/07/2015   Procedure: Left Heart Cath and Coronary Angiography;  Surgeon: Teodoro Spray, MD;  Location: [place] [facility];  Service: Cardiovascular;  Laterality: N/A;  . CHOLECYSTECTOMY    . ESOPHAGOGASTRODUODENOSCOPY (EGD) WITH  PROPOFOL N/A 02/03/2016   Procedure: ESOPHAGOGASTRODUODENOSCOPY (EGD) WITH PROPOFOL;  Surgeon: Lucilla Lame, MD;  Location: [place];  Service: Endoscopy;  Laterality: N/A;  . ESOPHAGOGASTRODUODENOSCOPY (EGD) WITH PROPOFOL N/A 03/02/2016   Procedure: ESOPHAGOGASTRODUODENOSCOPY (EGD) WITH PROPOFOL;  Surgeon: Lucilla Lame, MD;  Location: [place];  Service: Endoscopy;  Laterality: N/A;  . NISSEN FUNDOPLICATION    . POSTERIOR LUMBAR FUSION 4 LEVEL N/A 06/05/2016   Procedure: Thoracic six to pelvis fixation with Smith-Peterson Thoracic osteotomies at Thoracic ten-Thoracic Twelve with AIRO, Removal hardware lumbar two to lumbar five;  Surgeon: Erline Levine, MD;  Location: [place];  Service: Neurosurgery;  Laterality: N/A;  . SYMPATHECTOMY     ? improve blood flow to legs  . TONSILECTOMY, ADENOIDECTOMY, BILATERAL MYRINGOTOMY AND TUBES       Prior to Admission medications   Medication Sig Start Date End Date Taking? Authorizing Provider  amiodarone (PACERONE) 200 MG tablet tAtKE 2 TABLETS TWICE DAILY FOR 1 MONTH THEN 1 TABLET DAILY 07/06/17   [provider]  amLODipine (NORVASC) 2.5 MG tablet Take by mouth. 11/04/16   [provider]  aspirin EC 81 MG tablet Take 81 mg by mouth at bedtime.  08/02/06   [provider]  Calcium Carbonate-Vitamin D 600-200 MG-UNIT CAPS Take by mouth.    [provider]  calcium citrate-vitamin D (CITRACAL+D) 315-200 MG-UNIT tablet  Take 1 tablet by mouth daily.     [provider]  cetirizine (ZYRTEC) 10 MG tablet Take 10 mg by mouth at bedtime.    [provider]  clobetasol ointment (TEMOVATE) 0.05 % APPLY TWICE DAILY AS NEEDED TO SORE AREAS IN MOUTH UNTIL HEALED. AVOID FACE AND SKIN FOLDS. 04/29/15   [provider]  esomeprazole (NEXIUM) 20 MG capsule Take 1 capsule (20 mg total) by mouth 2 (two) times daily before a meal. Patient not taking: Reported on 09/08/2017 04/16/17 04/16/18  Lucilla Lame,  MD  estradiol (ESTRACE) 1 MG tablet Take 1 mg by mouth daily.     [provider]  fluticasone (FLONASE) 50 MCG/ACT nasal spray USE 2 PUFFS EACH NOSTRIL AT BEDTIME AS NEEDED FOR CONGESTION 03/05/14   [provider]  hydrochlorothiazide (HYDRODIURIL) 25 MG tablet hydrochlorothiazide 25 mg tablet    [provider]  meloxicam (MOBIC) 15 MG tablet Take by mouth.    [provider]  methocarbamol (ROBAXIN) 500 MG tablet Take 1 tablet (500 mg total) by mouth every 6 (six) hours as needed for muscle spasms. Patient not taking: Reported on 09/08/2017 06/09/16   Erline Levine, MD  metoprolol succinate (TOPROL-XL) 50 MG 24 hr tablet Take 50 mg by mouth daily. 10/10/14   [provider]  Multiple Vitamins-Minerals (OCUVITE EXTRA PO) Take 1 tablet by mouth daily.     [provider]  oxyCODONE (OXY IR/ROXICODONE) 5 MG immediate release tablet Take 1 tablet (5 mg total) by mouth every 6 (six) hours as needed for severe pain. 03/06/16 05/21/17  Milinda Pointer, MD  potassium chloride SA (K-DUR,KLOR-CON) 20 MEQ tablet Take by mouth. 07/28/17 07/28/18  [provider]  pravastatin (PRAVACHOL) 80 MG tablet Take 80 mg by mouth at bedtime. 09/15/14   [provider]  sennosides-docusate sodium (SENOKOT-S) 8.6-50 MG tablet Take 1 tablet by mouth daily as needed for constipation.    [provider]  valsartan-hydrochlorothiazide (DIOVAN-HCT) 160-12.5 MG per tablet Take 1 tablet by mouth daily. 10/29/14   [provider]     Allergies Atorvastatin and Rosuvastatin   Family History  Problem Relation Age of Onset  . Diabetes Mother   . Arthritis Mother   . Heart disease Mother   . Hypertension Mother   . Diabetes Father   . Asthma Father   . Heart disease Father   . Hypertension Father   . Stroke Father   . Breast cancer Neg Hx     Social History Social History   Tobacco Use  . Smoking status: Never Smoker  . Smokeless  tobacco: Never Used  Substance Use Topics  . Alcohol use: No  . Drug use: No    Review of Systems  Constitutional:   No fever or chills.  ENT:   No sore throat. No rhinorrhea. Cardiovascular:   No chest pain or syncope. Respiratory:   No dyspnea or cough. Gastrointestinal:   Negative for abdominal pain, vomiting and diarrhea.  Musculoskeletal:   Negative for focal pain or swelling All other systems reviewed and are negative except as documented above in ROS and HPI.  ____________________________________________   PHYSICAL EXAM:  VITAL SIGNS: ED Triage Vitals  Enc Vitals Group     BP 09/10/17 1704 (!) 147/60     Pulse Rate 09/10/17 1704 (!) 55     Resp 09/10/17 1704 15     Temp 09/10/17 1704 97.8 F (36.6 C)     Temp Source 09/10/17 1704 Oral  SpO2 09/10/17 1704 96 %     Weight 09/10/17 1703 167 lb (75.8 kg)     Height 09/10/17 1703 5\' 2"  (1.575 m)     Head Circumference --      Peak Flow --      Pain Score 09/10/17 1703 7     Pain Loc --      Pain Edu? --      Excl. in [place]? --     Vital signs reviewed, nursing assessments reviewed.   Constitutional:   Alert and oriented. nontoxic. Eyes:   Conjunctivae are normal. EOMI. PERRL. ENT      Head:   Normocephalic with contusion over the bilateral frontal scalp, bridge of nose, and periorbital area. No raccoon eyes. No battle sign..no hemotympanum      Nose:   No congestion/rhinnorhea. no epistaxis or septal hematoma      Mouth/Throat:   MMM, no pharyngeal erythema. No peritonsillar mass.       Neck:   No meningismus. Full ROM.no midline tenderness Hematological/Lymphatic/Immunilogical:   No cervical lymphadenopathy. Cardiovascular:   RRR. Symmetric bilateral radial and DP pulses.  No murmurs.  Respiratory:   Normal respiratory effort without tachypnea/retractions. Breath sounds are clear and equal bilaterally. No wheezes/rales/rhonchi. Gastrointestinal:   Soft and nontender. Non distended. There is no CVA tenderness.   No rebound, rigidity, or guarding.  Musculoskeletal:   Normal range of motion in all extremities. No joint effusions.  No lower extremity tenderness.  No edema. Neurologic:   Normal speech and language.  Motor grossly intact. No acute focal neurologic deficits are appreciated.  Skin:    Skin is warm, dry and intact. No rash noted.  No petechiae, purpura, or bullae.  ____________________________________________    LABS (pertinent positives/negatives) (all labs ordered are listed, but only abnormal results are displayed) Labs Reviewed  CBC WITH DIFFERENTIAL/PLATELET - Abnormal; Notable for the following components:      Result Value   MCH 25.9 (*)    RDW 17.7 (*)    All other components within normal limits  COMPREHENSIVE METABOLIC PANEL - Abnormal; Notable for the following components:   Chloride 96 (*)    Glucose, Bld 107 (*)    GFR calc non Af Amer 59 (*)    All other components within normal limits  TROPONIN I  PROTIME-INR   ____________________________________________   EKG  interpreted by me Sinus bradycardia rate of 57, left axis, normal intervals. Normal ST segments and T waves. voltage criteria for LVH in the high lateral leads  ____________________________________________    RADIOLOGY  Ct Head Wo Contrast  Result Date: 09/10/2017 CLINICAL DATA:  [place] and hit her face three days ago. Nausea and weakness. Headaches since the fall, getting worse. Bilateral periorbital bruising and bruising at the bridge of the nose. Neck pain and facial pain and pressure. EXAM: CT HEAD WITHOUT CONTRAST CT MAXILLOFACIAL WITHOUT CONTRAST CT CERVICAL SPINE WITHOUT CONTRAST TECHNIQUE: Multidetector CT imaging of the head, cervical spine, and maxillofacial structures were performed using the standard protocol without intravenous contrast. Multiplanar CT image reconstructions of the cervical spine and maxillofacial structures were also generated. COMPARISON:  Head CT dated 07/09/2004. Brain  MR dated 05/25/2017. Cervical spine MR dated 07/10/2008. FINDINGS: CT HEAD FINDINGS Brain: Diffusely enlarged ventricles and subarachnoid spaces. Patchy white matter low density in both cerebral hemispheres. No intracranial hemorrhage, mass lesion or CT evidence of acute infarction. Vascular: No hyperdense vessel or unexpected calcification. Skull: Mild bilateral hyperostosis frontalis.  No fractures. Other:  None. CT MAXILLOFACIAL FINDINGS Osseous: No fracture or mandibular dislocation. No destructive process. Orbits: Negative. No traumatic or inflammatory finding. Sinuses: Small lateral right maxillary sinus osteoma. Small left maxillary sinus retention cysts. Soft tissues: Mild bilateral frontal subcutaneous edema/hemorrhage. CT CERVICAL SPINE FINDINGS Alignment: Straightening of the normal cervical lordosis. No subluxations. Skull base and vertebrae: No acute fracture. No primary bone lesion or focal pathologic process. Soft tissues and spinal canal: No prevertebral fluid or swelling. No visible canal hematoma. Disc levels: Moderate anterior spur formation at the C3-4 and C4-5 levels. Mild posterior spur formation at the C3-4 level. Interbody Ray cages and solid bone fusion at the C5-6 and C6-7 levels. Moderate anterior spur formation at the C7-T1 level and mild anterior spur formation at the T1-2 level. Upper chest: Clear lung apices. Other: Large number of small bilateral parotid gland nodules. Mild left carotid artery atheromatous calcification and minimal right carotid artery calcification. IMPRESSION: 1. No skull fracture or intracranial hemorrhage. 2. No cervical spine fracture or subluxation. 3. No maxillofacial fracture. 4. Diffuse cerebral and cerebellar atrophy. 5. Chronic small vessel white matter ischemic changes in both cerebral hemispheres. 6. Cervical spine postsurgical and degenerative changes. 7. Large number of small, nonspecific nodules in both parotid glands. 8. Mild left and minimal right  carotid artery atheromatous calcification. Electronically Signed   By: Claudie Revering M.D.   On: 09/10/2017 18:17   Ct Cervical Spine Wo Contrast  Result Date: 09/10/2017 CLINICAL DATA:  [place] and hit her face three days ago. Nausea and weakness. Headaches since the fall, getting worse. Bilateral periorbital bruising and bruising at the bridge of the nose. Neck pain and facial pain and pressure. EXAM: CT HEAD WITHOUT CONTRAST CT MAXILLOFACIAL WITHOUT CONTRAST CT CERVICAL SPINE WITHOUT CONTRAST TECHNIQUE: Multidetector CT imaging of the head, cervical spine, and maxillofacial structures were performed using the standard protocol without intravenous contrast. Multiplanar CT image reconstructions of the cervical spine and maxillofacial structures were also generated. COMPARISON:  Head CT dated 07/09/2004. Brain MR dated 05/25/2017. Cervical spine MR dated 07/10/2008. FINDINGS: CT HEAD FINDINGS Brain: Diffusely enlarged ventricles and subarachnoid spaces. Patchy white matter low density in both cerebral hemispheres. No intracranial hemorrhage, mass lesion or CT evidence of acute infarction. Vascular: No hyperdense vessel or unexpected calcification. Skull: Mild bilateral hyperostosis frontalis.  No fractures. Other: None. CT MAXILLOFACIAL FINDINGS Osseous: No fracture or mandibular dislocation. No destructive process. Orbits: Negative. No traumatic or inflammatory finding. Sinuses: Small lateral right maxillary sinus osteoma. Small left maxillary sinus retention cysts. Soft tissues: Mild bilateral frontal subcutaneous edema/hemorrhage. CT CERVICAL SPINE FINDINGS Alignment: Straightening of the normal cervical lordosis. No subluxations. Skull base and vertebrae: No acute fracture. No primary bone lesion or focal pathologic process. Soft tissues and spinal canal: No prevertebral fluid or swelling. No visible canal hematoma. Disc levels: Moderate anterior spur formation at the C3-4 and C4-5 levels. Mild posterior spur  formation at the C3-4 level. Interbody Ray cages and solid bone fusion at the C5-6 and C6-7 levels. Moderate anterior spur formation at the C7-T1 level and mild anterior spur formation at the T1-2 level. Upper chest: Clear lung apices. Other: Large number of small bilateral parotid gland nodules. Mild left carotid artery atheromatous calcification and minimal right carotid artery calcification. IMPRESSION: 1. No skull fracture or intracranial hemorrhage. 2. No cervical spine fracture or subluxation. 3. No maxillofacial fracture. 4. Diffuse cerebral and cerebellar atrophy. 5. Chronic small vessel white matter ischemic changes in both cerebral hemispheres. 6. Cervical spine postsurgical and degenerative  changes. 7. Large number of small, nonspecific nodules in both parotid glands. 8. Mild left and minimal right carotid artery atheromatous calcification. Electronically Signed   By: Claudie Revering M.D.   On: 09/10/2017 18:17   Ct Maxillofacial Wo Contrast  Result Date: 09/10/2017 CLINICAL DATA:  [place] and hit her face three days ago. Nausea and weakness. Headaches since the fall, getting worse. Bilateral periorbital bruising and bruising at the bridge of the nose. Neck pain and facial pain and pressure. EXAM: CT HEAD WITHOUT CONTRAST CT MAXILLOFACIAL WITHOUT CONTRAST CT CERVICAL SPINE WITHOUT CONTRAST TECHNIQUE: Multidetector CT imaging of the head, cervical spine, and maxillofacial structures were performed using the standard protocol without intravenous contrast. Multiplanar CT image reconstructions of the cervical spine and maxillofacial structures were also generated. COMPARISON:  Head CT dated 07/09/2004. Brain MR dated 05/25/2017. Cervical spine MR dated 07/10/2008. FINDINGS: CT HEAD FINDINGS Brain: Diffusely enlarged ventricles and subarachnoid spaces. Patchy white matter low density in both cerebral hemispheres. No intracranial hemorrhage, mass lesion or CT evidence of acute infarction. Vascular: No hyperdense  vessel or unexpected calcification. Skull: Mild bilateral hyperostosis frontalis.  No fractures. Other: None. CT MAXILLOFACIAL FINDINGS Osseous: No fracture or mandibular dislocation. No destructive process. Orbits: Negative. No traumatic or inflammatory finding. Sinuses: Small lateral right maxillary sinus osteoma. Small left maxillary sinus retention cysts. Soft tissues: Mild bilateral frontal subcutaneous edema/hemorrhage. CT CERVICAL SPINE FINDINGS Alignment: Straightening of the normal cervical lordosis. No subluxations. Skull base and vertebrae: No acute fracture. No primary bone lesion or focal pathologic process. Soft tissues and spinal canal: No prevertebral fluid or swelling. No visible canal hematoma. Disc levels: Moderate anterior spur formation at the C3-4 and C4-5 levels. Mild posterior spur formation at the C3-4 level. Interbody Ray cages and solid bone fusion at the C5-6 and C6-7 levels. Moderate anterior spur formation at the C7-T1 level and mild anterior spur formation at the T1-2 level. Upper chest: Clear lung apices. Other: Large number of small bilateral parotid gland nodules. Mild left carotid artery atheromatous calcification and minimal right carotid artery calcification. IMPRESSION: 1. No skull fracture or intracranial hemorrhage. 2. No cervical spine fracture or subluxation. 3. No maxillofacial fracture. 4. Diffuse cerebral and cerebellar atrophy. 5. Chronic small vessel white matter ischemic changes in both cerebral hemispheres. 6. Cervical spine postsurgical and degenerative changes. 7. Large number of small, nonspecific nodules in both parotid glands. 8. Mild left and minimal right carotid artery atheromatous calcification. Electronically Signed   By: Claudie Revering M.D.   On: 09/10/2017 18:17    ____________________________________________   PROCEDURES Procedures  ____________________________________________  DIFFERENTIAL DIAGNOSIS intracranial hemorrhage, skull fracture,  C-spine fracture   CLINICAL IMPRESSION / ASSESSMENT AND PLAN / ED COURSE  Pertinent labs & imaging results that were available during my care of the patient were reviewed by me and considered in my medical decision making (see chart for details).    patient presents with blunt trauma to the face. Also a few small abrasions in the periorbital area from her broken glasses. Did not fall into the [place]. Denies eye pain or vision changes.exam is overall reassuring. CT scans were obtained to evaluate for traumatic injury, negative for C-spine fracture, intracranial hemorrhage or skull fracture. She does not have raccoon eyes.  I will give the patient a tetanus vaccination, follow-up with primary care. Counseled on concussion.      ____________________________________________   FINAL CLINICAL IMPRESSION(S) / ED DIAGNOSES    Final diagnoses:  Injury of head, initial encounter  Contusion of face, initial encounter  Concussion without loss of consciousness, initial encounter     ED Discharge Orders    None      Portions of this note were generated with dragon dictation software. Dictation errors may occur despite best attempts at proofreading.    Carrie Mew, MD 09/10/17 2021

## 2017-09-10 NOTE — ED Notes (Signed)
Discussed pt with Dr Burlene Arnt see new orders

## 2017-09-10 NOTE — ED Triage Notes (Signed)
Pt fell Tuesday while outside trimming roses - she states that her legs started shaking/she got weak/and fell hitting face on side walk  Currently denies dizziness, denies vomiting, reports nausea and weakness, c/o headache since the fall but that the headache is getting worse - pt has bruising to bilat eyes and bridge of nose  c/o neck pain and facial pain/pressure

## 2017-09-16 ENCOUNTER — Ambulatory Visit
Admission: RE | Admit: 2017-09-16 | Discharge: 2017-09-16 | Disposition: A | Discharge status: Home / Self Care | Payer: Medicare Other | Source: Ambulatory Visit | Attending: Vascular Surgery | Admitting: Vascular Surgery

## 2017-09-16 DIAGNOSIS — I6523 Occlusion and stenosis of bilateral carotid arteries: Secondary | ICD-10-CM | POA: Insufficient documentation

## 2017-09-16 DIAGNOSIS — R29898 Other symptoms and signs involving the musculoskeletal system: Secondary | ICD-10-CM

## 2017-09-23 ENCOUNTER — Ambulatory Visit (INDEPENDENT_AMBULATORY_CARE_PROVIDER_SITE_OTHER): Payer: Medicare Other | Admitting: Vascular Surgery

## 2017-09-23 ENCOUNTER — Encounter (INDEPENDENT_AMBULATORY_CARE_PROVIDER_SITE_OTHER): Payer: Self-pay | Admitting: Vascular Surgery

## 2017-09-23 VITALS — BP 127/74 | HR 48 | Resp 13 | Ht 62.0 in | Wt 169.0 lb

## 2017-09-23 DIAGNOSIS — I6523 Occlusion and stenosis of bilateral carotid arteries: Secondary | ICD-10-CM | POA: Insufficient documentation

## 2017-09-23 DIAGNOSIS — R29898 Other symptoms and signs involving the musculoskeletal system: Secondary | ICD-10-CM | POA: Diagnosis not present

## 2017-09-23 NOTE — Progress Notes (Signed)
Subjective:    Patient ID: Allison Mclean, female    DOB: 1936/03/18, 82 y.o.   MRN: [id number] Chief Complaint  Patient presents with  . Follow-up    [place] results   Presents to review vascular studies.  The patient was last seen on September 08, 2017 for evaluation of carotid artery stenosis and bilateral lower extremity weakness.  The patient underwent a bilateral ABI conducted at [facility] radiology department which was notable for right ABI: 1.24 and left ABI: 1.27, with bilateral normal arterial waveforms at the ankle.  Overall the patient has normal resting ABIs.  No previous ABI for comparison.  Patient underwent a bilateral carotid artery duplex which is also conducted at [facility] radiology department which was notable for minor carotid atherosclerosis.  No hemodynamically significant ICA stenosis.  Degree of narrowing remains less than 50%.  This is stable when compared to the previous exam. The patient denies experiencing Amaurosis Fugax, TIA like symptoms or focal motor deficits.  The patient continues to have weakness in her legs however she does have extensive spinal/lumbar degenerative joint disease.  Patient denies any fever, nausea vomiting.  Review of Systems  Constitutional: Negative.   HENT: Negative.   Eyes: Negative.   Respiratory: Negative.   Cardiovascular:       Carotid artery stenosis  Gastrointestinal: Negative.   Endocrine: Negative.   Genitourinary: Negative.   Musculoskeletal: Negative.   Skin: Negative.   Allergic/Immunologic: Negative.   Neurological: Positive for weakness.  Hematological: Negative.   Psychiatric/Behavioral: Negative.       Objective:   Physical Exam  Constitutional: She is oriented to person, place, and time. She appears well-developed and well-nourished. No distress.  HENT:  Head: Normocephalic and atraumatic.  Right Ear: External ear normal.  Left Ear: External ear normal.  Eyes:  Pupils are equal, round, and reactive to light. Conjunctivae and EOM are normal.  Neck: Normal range of motion.  No carotid bruits noted on exam  Cardiovascular: Normal rate, regular rhythm, normal heart sounds and intact distal pulses.  Pulses:      Radial pulses are 2+ on the right side, and 2+ on the left side.       Dorsalis pedis pulses are 2+ on the right side, and 2+ on the left side.       Posterior tibial pulses are 2+ on the right side, and 2+ on the left side.  Pulmonary/Chest: Effort normal and breath sounds normal.  Musculoskeletal: Normal range of motion.  Neurological: She is alert and oriented to person, place, and time.  Skin: Skin is warm and dry. She is not diaphoretic.  Psychiatric: She has a normal mood and affect. Her behavior is normal. Judgment and thought content normal.   BP 127/74 (BP Location: Right Arm, Patient Position: Sitting)   Pulse (!) 48   Resp 13   Ht 5\' 2"  (1.575 m)   Wt 169 lb (76.7 kg)   BMI 30.91 kg/m   Past Medical History:  Diagnosis Date  . Abnormal MRI, thoracic spine 07/02/2015   IMPRESSION: 1. 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 Subsequent moderate to severe adjacent segment facet arthropathy. 2. Widespread thoracic spine disc bulging. Occasional small superimposed disc protrusions (T8-9, T11-12). 3. Subsequent multifactorial mild thoracic spinal stenosis T7-8, T8-9, T9-10, T10-11, T11-12, and T12-L1. Up to mild spi  . Acid reflux 11/22/2014  . 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  Allergy   . Chronic thoracic radicular pain (T5/T6) (Left) 07/02/2015  . Essential (primary) hypertension 12/23/2013  . GERD (gastroesophageal reflux disease)   . Heart murmur   . History of fusion of cervical spine 07/02/2015  . History of hiatal hernia    s/p  . History of kidney stones   . History of lumbar fusion (12/19/2013) (posterior lumbar fusion from L2-L5) 07/02/2015   Pedicle screws and posterior  hardware at L5 L4-L3 and L2. Neil disc block at the L4-5 disc space. Slight anterior subluxation of L4 on L5. Different configuration and this had L3-4. Degenerative changes of the lumbar disc spaces. L5-S1 disc space well-maintained .   Marland Kitchen HLD (hyperlipidemia) 11/07/2013  . Hyperlipidemia   . Hypertension   . Infection    abcess in left upper tooth   . Jackhammer esophagus   . Nerve root pain 07/02/2015   Thoracic pain is likely to be secondary to severe left T5 foraminal stenosis.   . Neuropathic pain 08/05/2015  . Opiate use (10 MME/day) 07/02/2015  . Osteopenia, senile 07/02/2015  . Supraventricular tachycardia ([place]) 11/07/2013  . Thoracic central spinal stenosis (T7-8, T8-9, T9-10, T10-11, T11-12, and T12-L1) 07/02/2015  . Thoracic facet syndrome (Left) 07/02/2015  . Thoracic foraminal stenosis (Moderate to severe at: Right T5; Bilateral T9; Left T11) 07/02/2015  . Thoracic spondylosis with radiculopathy (Left) 07/09/2015  . Wears dentures    partial lower   Social History   Socioeconomic History  . Marital status: Married    Spouse name: Not on file  . Number of children: Not on file  . Years of education: Not on file  . Highest education level: Not on file  Occupational History  . Not on file  Social Needs  . Financial resource strain: Not on file  . Food insecurity:    Worry: Not on file    Inability: Not on file  . Transportation needs:    Medical: Not on file    Non-medical: Not on file  Tobacco Use  . Smoking status: Never Smoker  . Smokeless tobacco: Never Used  Substance and Sexual Activity  . Alcohol use: No  . Drug use: No  . Sexual activity: Not on file  Lifestyle  . Physical activity:    Days per week: Not on file    Minutes per session: Not on file  . Stress: Not on file  Relationships  . Social connections:    Talks on phone: Not on file    Gets together: Not on file    Attends religious service: Not on file    Active member of club or organization: Not on  file    Attends meetings of clubs or organizations: Not on file    Relationship status: Not on file  . Intimate partner violence:    Fear of current or ex partner: Not on file    Emotionally abused: Not on file    Physically abused: Not on file    Forced sexual activity: Not on file  Other Topics Concern  . Not on file  Social History Narrative  . Not on file   Past Surgical History:  Procedure Laterality Date  . ABDOMINAL EXPOSURE N/A 06/02/2016   Procedure: ABDOMINAL EXPOSURE;  Surgeon: Angelia Mould, MD;  Location: [place];  Service: Vascular;  Laterality: N/A;  . ABDOMINAL HYSTERECTOMY    . ANTERIOR LAT LUMBAR FUSION N/A 06/02/2016   Procedure: L1-2 Left Anterior lateral lumbar interbody fusion;  Surgeon: Erline Levine,  MD;  Location: [place];  Service: Neurosurgery;  Laterality: N/A;  L1-2 Anterior lateral lumbar interbody fusion  . ANTERIOR LUMBAR FUSION N/A 06/02/2016   Procedure: Lumbar five -sacral one Anterior lumbar interbody fusion with Dr. Deitra Mayo; Lumbar one-two Left Anterior lateral lumbar interbody fusion;  Surgeon: Erline Levine, MD;  Location: [place];  Service: Neurosurgery;  Laterality: N/A;  . APPENDECTOMY    . APPLICATION OF INTRAOPERATIVE CT SCAN N/A 06/05/2016   Procedure: APPLICATION OF INTRAOPERATIVE CT SCAN;  Surgeon: Erline Levine, MD;  Location: [place];  Service: Neurosurgery;  Laterality: N/A;  . BACK SURGERY     2005, 2006, 2015  . CARDIAC CATHETERIZATION N/A 02/07/2015   Procedure: Left Heart Cath and Coronary Angiography;  Surgeon: Teodoro Spray, MD;  Location: [place] CV LAB;  Service: Cardiovascular;  Laterality: N/A;  . CHOLECYSTECTOMY    . ESOPHAGOGASTRODUODENOSCOPY (EGD) WITH PROPOFOL N/A 02/03/2016   Procedure: ESOPHAGOGASTRODUODENOSCOPY (EGD) WITH PROPOFOL;  Surgeon: Lucilla Lame, MD;  Location: [place];  Service: Endoscopy;  Laterality: N/A;  . ESOPHAGOGASTRODUODENOSCOPY (EGD) WITH PROPOFOL N/A 03/02/2016   Procedure:  ESOPHAGOGASTRODUODENOSCOPY (EGD) WITH PROPOFOL;  Surgeon: Lucilla Lame, MD;  Location: [place];  Service: Endoscopy;  Laterality: N/A;  . NISSEN FUNDOPLICATION    . POSTERIOR LUMBAR FUSION 4 LEVEL N/A 06/05/2016   Procedure: Thoracic six to pelvis fixation with Smith-Peterson Thoracic osteotomies at Thoracic ten-Thoracic Twelve with AIRO, Removal hardware lumbar two to lumbar five;  Surgeon: Erline Levine, MD;  Location: [place];  Service: Neurosurgery;  Laterality: N/A;  . SYMPATHECTOMY     ? improve blood flow to legs  . TONSILECTOMY, ADENOIDECTOMY, BILATERAL MYRINGOTOMY AND TUBES     Family History  Problem Relation Age of Onset  . Diabetes Mother   . Arthritis Mother   . Heart disease Mother   . Hypertension Mother   . Diabetes Father   . Asthma Father   . Heart disease Father   . Hypertension Father   . Stroke Father   . Breast cancer Neg Hx    Allergies  Allergen Reactions  . Atorvastatin Other (See Comments)    Chest pain    . Rosuvastatin Other (See Comments)    Causes chest pain        Assessment & Plan:  Presents to review vascular studies.  The patient was last seen on September 08, 2017 for evaluation of carotid artery stenosis and bilateral lower extremity weakness.  The patient underwent a bilateral ABI conducted at [facility] radiology department which was notable for right ABI: 1.24 and left ABI: 1.27, with bilateral normal arterial waveforms at the ankle.  Overall the patient has normal resting ABIs.  No previous ABI for comparison.  Patient underwent a bilateral carotid artery duplex which is also conducted at [facility] radiology department which was notable for minor carotid atherosclerosis.  No hemodynamically significant ICA stenosis.  Degree of narrowing remains less than 50%.  This is stable when compared to the previous exam. The patient denies experiencing Amaurosis Fugax, TIA like symptoms or focal motor  deficits.  The patient continues to have weakness in her legs however she does have extensive spinal/lumbar degenerative joint disease.  Patient denies any fever, nausea vomiting.  1. Weakness of both lower extremities - Stable Patient with normal ABIs Patient does have an extensive history of lumbar spinal degenerative joint disease requiring surgical intervention This is most likely the cause of her bilateral lower extremity weakness  2. Bilateral carotid artery stenosis - Stable Studies reviewed with patient. Patient asymptomatic with stable duplex.  No intervention at this time.  Patient to return in two years for surveillance carotid duplex. Patient to continue medical optimization with ASA and dyslipidemia medication. Patient to remain abstinent of tobacco use. I have discussed with the patient at length the risk factors for and pathogenesis of atherosclerotic disease and encouraged a healthy diet, regular exercise regimen and blood pressure / glucose control.  Patient was instructed to contact our office in the interim with problems such as arm / leg weakness or numbness, speech / swallowing difficulty or temporary monocular blindness. The patient expresses their understanding.   - VAS US CAROTID; Future  Current Outpatient Medications on File Prior to Visit  Medication Sig Dispense Refill  . amiodarone (PACERONE) 200 MG tablet tAtKE 2 TABLETS TWICE DAILY FOR 1 MONTH THEN 1 TABLET DAILY    . amLODipine (NORVASC) 2.5 MG tablet Take by mouth.    Marland Kitchen aspirin EC 81 MG tablet Take 81 mg by mouth at bedtime.     . Calcium Carbonate-Vitamin D 600-200 MG-UNIT CAPS Take by mouth.    . calcium citrate-vitamin D (CITRACAL+D) 315-200 MG-UNIT tablet Take 1 tablet by mouth daily.     . cetirizine (ZYRTEC) 10 MG tablet Take 10 mg by mouth at bedtime.    . clobetasol ointment (TEMOVATE) 0.05 % APPLY TWICE DAILY AS NEEDED TO SORE AREAS IN MOUTH UNTIL HEALED. AVOID FACE AND SKIN FOLDS.  1  .  esomeprazole (NEXIUM) 20 MG capsule Take 1 capsule (20 mg total) by mouth 2 (two) times daily before a meal. 60 capsule 11  . estradiol (ESTRACE) 1 MG tablet Take 1 mg by mouth daily.     . fluticasone (FLONASE) 50 MCG/ACT nasal spray USE 2 PUFFS EACH NOSTRIL AT BEDTIME AS NEEDED FOR CONGESTION    . hydrochlorothiazide (HYDRODIURIL) 25 MG tablet hydrochlorothiazide 25 mg tablet    . meloxicam (MOBIC) 15 MG tablet Take by mouth.    . methocarbamol (ROBAXIN) 500 MG tablet Take 1 tablet (500 mg total) by mouth every 6 (six) hours as needed for muscle spasms. 60 tablet 1  . metoprolol succinate (TOPROL-XL) 50 MG 24 hr tablet Take 50 mg by mouth daily.  3  . Multiple Vitamins-Minerals (OCUVITE EXTRA PO) Take 1 tablet by mouth daily.     . potassium chloride SA (K-DUR,KLOR-CON) 20 MEQ tablet Take by mouth.    . pravastatin (PRAVACHOL) 80 MG tablet Take 80 mg by mouth at bedtime.  3  . sennosides-docusate sodium (SENOKOT-S) 8.6-50 MG tablet Take 1 tablet by mouth daily as needed for constipation.    Marland Kitchen tobramycin-dexamethasone (TOBRADEX) ophthalmic solution     . valsartan-hydrochlorothiazide (DIOVAN-HCT) 160-12.5 MG per tablet Take 1 tablet by mouth daily.  3  . oxyCODONE (OXY IR/ROXICODONE) 5 MG immediate release tablet Take 1 tablet (5 mg total) by mouth every 6 (six) hours as needed for severe pain. 120 tablet 0   No current facility-administered medications on file prior to visit.    There are no Patient Instructions on file for this visit. No follow-ups on file.  Alante Tolan A Shiryl Ruddy, PA-C

## 2017-11-30 ENCOUNTER — Institutional Professional Consult (permissible substitution): Payer: Medicare Other | Admitting: Internal Medicine

## 2017-12-08 ENCOUNTER — Encounter (INDEPENDENT_AMBULATORY_CARE_PROVIDER_SITE_OTHER): Payer: Self-pay

## 2017-12-08 ENCOUNTER — Ambulatory Visit (INDEPENDENT_AMBULATORY_CARE_PROVIDER_SITE_OTHER): Payer: Medicare Other | Admitting: Internal Medicine

## 2017-12-08 ENCOUNTER — Encounter: Payer: Self-pay | Admitting: Internal Medicine

## 2017-12-08 VITALS — BP 136/74 | HR 63 | Ht 61.0 in | Wt 169.0 lb

## 2017-12-08 DIAGNOSIS — I471 Supraventricular tachycardia: Secondary | ICD-10-CM | POA: Diagnosis not present

## 2017-12-08 DIAGNOSIS — I472 Ventricular tachycardia: Secondary | ICD-10-CM

## 2017-12-08 DIAGNOSIS — R0602 Shortness of breath: Secondary | ICD-10-CM

## 2017-12-08 DIAGNOSIS — I509 Heart failure, unspecified: Secondary | ICD-10-CM

## 2017-12-08 DIAGNOSIS — R55 Syncope and collapse: Secondary | ICD-10-CM

## 2017-12-08 DIAGNOSIS — I4729 Other ventricular tachycardia: Secondary | ICD-10-CM

## 2017-12-08 NOTE — Patient Instructions (Signed)
Medication Instructions:  Your physician recommends that you continue on your current medications as directed. Please refer to the Current Medication list given to you today.   Labwork: None ordered.  Testing/Procedures: None ordered.  Follow-Up: Your physician recommends that you schedule a follow-up appointment as needed with Dr Allred.   Any Other Special Instructions Will Be Listed Below (If Applicable).     If you need a refill on your cardiac medications before your next appointment, please call your pharmacy.   

## 2017-12-08 NOTE — Progress Notes (Signed)
Electrophysiology Office Note   Date:  12/08/2017   ID:  CHIMENE SALO, DOB 1935-04-25, MRN [id number]  PCP:  Idelle Crouch, MD  Cardiologist:  Dr Saralyn Pilar Primary Electrophysiologist: Dr Mylinda Latina ([place])  CC: syncope   History of Present Illness: DEION FORGUE is a 82 y.o. female who presents today for electrophysiology evaluation.   She was previously evaluated by Dr Mylinda Latina for EP for syncope and frequent NSVT.  She was placed on amiodarone and underwent cardiac MRI.  This revealed EF 33%. She was supposed to follow-up with him soon but has decided to see me for a second opinion.  She states that based on her MRI findings that Dr Saralyn Pilar has planned cath.  This has not yet been performed.  She is doing reasonably well at this time.  + SOB with moderate activity.  Today, she denies symptoms of palpitations, chest pain,  orthopnea, PND, lower extremity edema, claudication, further syncope, bleeding, or neurologic sequela. The patient is tolerating medications without difficulties and is otherwise without complaint today.    Past Medical History:  Diagnosis Date  . Abnormal MRI, thoracic spine 07/02/2015   IMPRESSION: 1. 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 Subsequent moderate to severe adjacent segment facet arthropathy. 2. Widespread thoracic spine disc bulging. Occasional small superimposed disc protrusions (T8-9, T11-12). 3. Subsequent multifactorial mild thoracic spinal stenosis T7-8, T8-9, T9-10, T10-11, T11-12, and T12-L1. Up to mild spi  . Acid reflux 11/22/2014  . 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 Allergy   . Chronic thoracic radicular pain (T5/T6) (Left) 07/02/2015  . Essential (primary) hypertension 12/23/2013  . GERD (gastroesophageal reflux disease)   . Heart murmur   . History of fusion of cervical spine 07/02/2015  . History of hiatal hernia    s/p  . History of kidney stones   . History of lumbar fusion  (12/19/2013) (posterior lumbar fusion from L2-L5) 07/02/2015   Pedicle screws and posterior hardware at L5 L4-L3 and L2. Neil disc block at the L4-5 disc space. Slight anterior subluxation of L4 on L5. Different configuration and this had L3-4. Degenerative changes of the lumbar disc spaces. L5-S1 disc space well-maintained .   Marland Kitchen HLD (hyperlipidemia) 11/07/2013  . Hyperlipidemia   . Hypertension   . Infection    abcess in left upper tooth   . Jackhammer esophagus   . Nerve root pain 07/02/2015   Thoracic pain is likely to be secondary to severe left T5 foraminal stenosis.   . Neuropathic pain 08/05/2015  . Opiate use (10 MME/day) 07/02/2015  . Osteopenia, senile 07/02/2015  . Supraventricular tachycardia ([place]) 11/07/2013  . Thoracic central spinal stenosis (T7-8, T8-9, T9-10, T10-11, T11-12, and T12-L1) 07/02/2015  . Thoracic facet syndrome (Left) 07/02/2015  . Thoracic foraminal stenosis (Moderate to severe at: Right T5; Bilateral T9; Left T11) 07/02/2015  . Thoracic spondylosis with radiculopathy (Left) 07/09/2015  . Wears dentures    partial lower   Past Surgical History:  Procedure Laterality Date  . ABDOMINAL EXPOSURE N/A 06/02/2016   Procedure: ABDOMINAL EXPOSURE;  Surgeon: Angelia Mould, MD;  Location: [place];  Service: Vascular;  Laterality: N/A;  . ABDOMINAL HYSTERECTOMY    . ANTERIOR LAT LUMBAR FUSION N/A 06/02/2016   Procedure: L1-2 Left Anterior lateral lumbar interbody fusion;  Surgeon: Erline Levine, MD;  Location: [place];  Service: Neurosurgery;  Laterality: N/A;  L1-2 Anterior lateral lumbar interbody fusion  . ANTERIOR LUMBAR FUSION  N/A 06/02/2016   Procedure: Lumbar five -sacral one Anterior lumbar interbody fusion with Dr. Deitra Mayo; Lumbar one-two Left Anterior lateral lumbar interbody fusion;  Surgeon: Erline Levine, MD;  Location: [place];  Service: Neurosurgery;  Laterality: N/A;  . APPENDECTOMY    . APPLICATION OF INTRAOPERATIVE CT SCAN N/A 06/05/2016   Procedure:  APPLICATION OF INTRAOPERATIVE CT SCAN;  Surgeon: Erline Levine, MD;  Location: [place];  Service: Neurosurgery;  Laterality: N/A;  . BACK SURGERY     2005, 2006, 2015  . CARDIAC CATHETERIZATION N/A 02/07/2015   Procedure: Left Heart Cath and Coronary Angiography;  Surgeon: Teodoro Spray, MD;  Location: [place] CV LAB;  Service: Cardiovascular;  Laterality: N/A;  . CHOLECYSTECTOMY    . ESOPHAGOGASTRODUODENOSCOPY (EGD) WITH PROPOFOL N/A 02/03/2016   Procedure: ESOPHAGOGASTRODUODENOSCOPY (EGD) WITH PROPOFOL;  Surgeon: Lucilla Lame, MD;  Location: [place];  Service: Endoscopy;  Laterality: N/A;  . ESOPHAGOGASTRODUODENOSCOPY (EGD) WITH PROPOFOL N/A 03/02/2016   Procedure: ESOPHAGOGASTRODUODENOSCOPY (EGD) WITH PROPOFOL;  Surgeon: Lucilla Lame, MD;  Location: [place];  Service: Endoscopy;  Laterality: N/A;  . NISSEN FUNDOPLICATION    . POSTERIOR LUMBAR FUSION 4 LEVEL N/A 06/05/2016   Procedure: Thoracic six to pelvis fixation with Smith-Peterson Thoracic osteotomies at Thoracic ten-Thoracic Twelve with AIRO, Removal hardware lumbar two to lumbar five;  Surgeon: Erline Levine, MD;  Location: [place];  Service: Neurosurgery;  Laterality: N/A;  . SYMPATHECTOMY     ? improve blood flow to legs  . TONSILECTOMY, ADENOIDECTOMY, BILATERAL MYRINGOTOMY AND TUBES       Current Outpatient Medications  Medication Sig Dispense Refill  . amiodarone (PACERONE) 200 MG tablet tAtKE 2 TABLETS TWICE DAILY FOR 1 MONTH THEN 1 TABLET DAILY    . amLODipine (NORVASC) 2.5 MG tablet Take by mouth.    Marland Kitchen aspirin EC 81 MG tablet Take 81 mg by mouth at bedtime.     . Calcium Carbonate-Vitamin D 600-200 MG-UNIT CAPS Take by mouth.    . calcium citrate-vitamin D (CITRACAL+D) 315-200 MG-UNIT tablet Take 1 tablet by mouth daily.     . clobetasol ointment (TEMOVATE) 0.05 % APPLY TWICE DAILY AS NEEDED TO SORE AREAS IN MOUTH UNTIL HEALED. AVOID FACE AND SKIN FOLDS.  1  . fluticasone (FLONASE) 50 MCG/ACT nasal  spray USE 2 PUFFS EACH NOSTRIL AT BEDTIME AS NEEDED FOR CONGESTION    . hydrochlorothiazide (HYDRODIURIL) 25 MG tablet hydrochlorothiazide 25 mg tablet    . Multiple Vitamins-Minerals (OCUVITE EXTRA PO) Take 1 tablet by mouth daily.     . potassium chloride SA (K-DUR,KLOR-CON) 20 MEQ tablet Take by mouth.    . pravastatin (PRAVACHOL) 80 MG tablet Take 80 mg by mouth at bedtime.  3  . sennosides-docusate sodium (SENOKOT-S) 8.6-50 MG tablet Take 1 tablet by mouth daily as needed for constipation.    . valsartan-hydrochlorothiazide (DIOVAN-HCT) 160-12.5 MG per tablet Take 1 tablet by mouth daily.  3   No current facility-administered medications for this visit.     Allergies:   Atorvastatin and Rosuvastatin   Social History:  The patient  reports that she has never smoked. She has never used smokeless tobacco. She reports that she does not drink alcohol or use drugs.   Family History:  The patient's  family history includes Arthritis in her mother; Asthma in her father; Diabetes in her father and mother; Heart disease in her father and mother; Hypertension in her father and mother; Stroke in her father.    ROS:  Please see the history of present illness.   All other systems are personally reviewed and negative.    PHYSICAL EXAM: VS:  BP 136/74   Pulse 63   Ht 5\' 1"  (1.549 m)   Wt 169 lb (76.7 kg)   SpO2 98%   BMI 31.93 kg/m  , BMI Body mass index is 31.93 kg/m. GEN: Well nourished, well developed, in no acute distress  HEENT: normal  Neck: no JVD, carotid bruits, or masses Cardiac: RRR; no murmurs, rubs, or gallops,no edema  Respiratory:  clear to auscultation bilaterally, normal work of breathing GI: soft, nontender, nondistended, + BS MS: no deformity or atrophy  Skin: warm and dry  Neuro:  Strength and sensation are intact Psych: euthymic mood, full affect  EKG:  EKG is ordered today. The ekg ordered today is personally reviewed and shows sinus rhythm 63 bpm, PR 170 msec,  QRS 106 msec, Qtc 480 msec, LVH, LAD  Event monitor 09/01/17 is reviewed and reveals sinus rhythm with episodes of nonsustained VT, PVCs  Recent Labs: 09/10/2017: ALT 14; BUN 16; Creatinine, Ser 0.90; Hemoglobin 12.0; Platelets 251; Potassium 3.5; Sodium 135  personally reviewed   Lipid Panel  No results found for: CHOL, TRIG, HDL, CHOLHDL, VLDL, LDLCALC, LDLDIRECT personally reviewed   Wt Readings from Last 3 Encounters:  12/08/17 169 lb (76.7 kg)  09/23/17 169 lb (76.7 kg)  09/10/17 167 lb (75.8 kg)      Other studies personally reviewed: Additional studies/ records that were reviewed today include: Drs Saralyn Pilar and Marcello Moores' notes, recent MRI  Review of the above records today demonstrates: as above   ASSESSMENT AND PLAN:  1.  Syncope, SOB, NSVT, EF 33% The patient presents for further ep evaluation.  She has been evaluated by Dr Marcello Moores and placed on amiodarone.  She has had no further syncope.  She continues to have SOB of unclear etiology.  She states that Dr Saralyn Pilar has planned cath.  Given ongoing issues with SOB, I think that this is prudent.  I would defer any decisions about ICD until results of her cath are available.  Given QRS < 130 msec, she does not meet criteria for CRT.  She will follow-up with Dr Saralyn Pilar as planned for cath.  If no revascularization is planned, it is reasonable to consider ICD implantation at that time.  Given her advanced age, a more conservative approach would also be reasonable.  If she required coronary revascularization, I would advise repeat echo after 90 days.  Follow-up with Dr Saralyn Pilar for cath as planned. I am happy to discuss further once cath results are obtained. It would also be reasonable for her to follow-up with Dr Caryl Comes in [place] if this is preferable for her.  Current medicines are reviewed at length with the patient today.   The patient does not have concerns regarding her medicines.  The following changes were made  today:  none    Signed, Thompson Grayer, MD  12/08/2017 10:58 AM     [facility] [address] [place] [address] [phone] (office) [phone] (fax)

## 2017-12-21 ENCOUNTER — Ambulatory Visit
Admission: RE | Admit: 2017-12-21 | Discharge: 2017-12-21 | Disposition: A | Discharge status: Home / Self Care | Payer: Medicare Other | Source: Ambulatory Visit | Attending: Cardiology | Admitting: Cardiology

## 2017-12-21 ENCOUNTER — Encounter
Admission: RE | Disposition: A | Discharge status: Home / Self Care | Payer: Self-pay | Source: Ambulatory Visit | Attending: Cardiology

## 2017-12-21 DIAGNOSIS — E785 Hyperlipidemia, unspecified: Secondary | ICD-10-CM | POA: Diagnosis not present

## 2017-12-21 DIAGNOSIS — M810 Age-related osteoporosis without current pathological fracture: Secondary | ICD-10-CM | POA: Insufficient documentation

## 2017-12-21 DIAGNOSIS — I420 Dilated cardiomyopathy: Secondary | ICD-10-CM | POA: Diagnosis not present

## 2017-12-21 DIAGNOSIS — Z823 Family history of stroke: Secondary | ICD-10-CM | POA: Insufficient documentation

## 2017-12-21 DIAGNOSIS — Z981 Arthrodesis status: Secondary | ICD-10-CM | POA: Insufficient documentation

## 2017-12-21 DIAGNOSIS — Z8249 Family history of ischemic heart disease and other diseases of the circulatory system: Secondary | ICD-10-CM | POA: Insufficient documentation

## 2017-12-21 DIAGNOSIS — I471 Supraventricular tachycardia: Secondary | ICD-10-CM | POA: Diagnosis not present

## 2017-12-21 DIAGNOSIS — R079 Chest pain, unspecified: Secondary | ICD-10-CM

## 2017-12-21 DIAGNOSIS — Z87442 Personal history of urinary calculi: Secondary | ICD-10-CM | POA: Diagnosis not present

## 2017-12-21 DIAGNOSIS — K219 Gastro-esophageal reflux disease without esophagitis: Secondary | ICD-10-CM | POA: Diagnosis not present

## 2017-12-21 DIAGNOSIS — Z6832 Body mass index (BMI) 32.0-32.9, adult: Secondary | ICD-10-CM | POA: Insufficient documentation

## 2017-12-21 DIAGNOSIS — Z9889 Other specified postprocedural states: Secondary | ICD-10-CM | POA: Insufficient documentation

## 2017-12-21 DIAGNOSIS — Z90710 Acquired absence of both cervix and uterus: Secondary | ICD-10-CM | POA: Insufficient documentation

## 2017-12-21 DIAGNOSIS — Z7951 Long term (current) use of inhaled steroids: Secondary | ICD-10-CM | POA: Diagnosis not present

## 2017-12-21 DIAGNOSIS — H25019 Cortical age-related cataract, unspecified eye: Secondary | ICD-10-CM | POA: Diagnosis not present

## 2017-12-21 DIAGNOSIS — I493 Ventricular premature depolarization: Secondary | ICD-10-CM | POA: Diagnosis not present

## 2017-12-21 DIAGNOSIS — K589 Irritable bowel syndrome without diarrhea: Secondary | ICD-10-CM | POA: Insufficient documentation

## 2017-12-21 DIAGNOSIS — Z888 Allergy status to other drugs, medicaments and biological substances status: Secondary | ICD-10-CM | POA: Diagnosis not present

## 2017-12-21 DIAGNOSIS — Z8619 Personal history of other infectious and parasitic diseases: Secondary | ICD-10-CM | POA: Insufficient documentation

## 2017-12-21 DIAGNOSIS — Z7982 Long term (current) use of aspirin: Secondary | ICD-10-CM | POA: Insufficient documentation

## 2017-12-21 DIAGNOSIS — I25119 Atherosclerotic heart disease of native coronary artery with unspecified angina pectoris: Secondary | ICD-10-CM | POA: Diagnosis not present

## 2017-12-21 DIAGNOSIS — Z79899 Other long term (current) drug therapy: Secondary | ICD-10-CM | POA: Diagnosis not present

## 2017-12-21 DIAGNOSIS — I5022 Chronic systolic (congestive) heart failure: Secondary | ICD-10-CM | POA: Diagnosis not present

## 2017-12-21 DIAGNOSIS — I110 Hypertensive heart disease with heart failure: Secondary | ICD-10-CM | POA: Insufficient documentation

## 2017-12-21 DIAGNOSIS — E669 Obesity, unspecified: Secondary | ICD-10-CM | POA: Diagnosis not present

## 2017-12-21 DIAGNOSIS — Z9049 Acquired absence of other specified parts of digestive tract: Secondary | ICD-10-CM | POA: Insufficient documentation

## 2017-12-21 HISTORY — PX: LEFT HEART CATH AND CORONARY ANGIOGRAPHY: CATH118249

## 2017-12-21 SURGERY — LEFT HEART CATH AND CORONARY ANGIOGRAPHY
Anesthesia: Moderate Sedation | Laterality: Left

## 2017-12-21 MED ORDER — ASPIRIN 81 MG PO CHEW
CHEWABLE_TABLET | ORAL | Status: AC
  Filled 2017-12-21: qty 1

## 2017-12-21 MED ORDER — MIDAZOLAM HCL 2 MG/2ML IJ SOLN
INTRAMUSCULAR | Status: DC | PRN
  Administered 2017-12-21: 1 mg via INTRAVENOUS

## 2017-12-21 MED ORDER — SODIUM CHLORIDE 0.9% FLUSH: 3.0000 mL | INTRAVENOUS | Status: DC | PRN

## 2017-12-21 MED ORDER — SODIUM CHLORIDE 0.9% FLUSH: 3.0000 mL | Freq: Two times a day (BID) | INTRAVENOUS | Status: DC

## 2017-12-21 MED ORDER — ASPIRIN 81 MG PO CHEW
81.0000 mg | CHEWABLE_TABLET | ORAL | Status: AC
  Administered 2017-12-21: 81 mg via ORAL

## 2017-12-21 MED ORDER — MIDAZOLAM HCL 2 MG/2ML IJ SOLN
INTRAMUSCULAR | Status: AC
  Filled 2017-12-21: qty 2

## 2017-12-21 MED ORDER — SODIUM CHLORIDE 0.9 % IV SOLN: 250.0000 mL | INTRAVENOUS | Status: DC | PRN

## 2017-12-21 MED ORDER — SODIUM CHLORIDE 0.9 % WEIGHT BASED INFUSION: 1.0000 mL/kg/h | INTRAVENOUS | Status: DC

## 2017-12-21 MED ORDER — FENTANYL CITRATE (PF) 100 MCG/2ML IJ SOLN
INTRAMUSCULAR | Status: AC
  Filled 2017-12-21: qty 2

## 2017-12-21 MED ORDER — ONDANSETRON HCL 4 MG/2ML IJ SOLN: 4.0000 mg | Freq: Four times a day (QID) | INTRAMUSCULAR | Status: DC | PRN

## 2017-12-21 MED ORDER — FENTANYL CITRATE (PF) 100 MCG/2ML IJ SOLN
INTRAMUSCULAR | Status: DC | PRN
  Administered 2017-12-21: 25 ug via INTRAVENOUS

## 2017-12-21 MED ORDER — SODIUM CHLORIDE 0.9 % WEIGHT BASED INFUSION: 3.0000 mL/kg/h | INTRAVENOUS | Status: AC

## 2017-12-21 MED ORDER — ACETAMINOPHEN 325 MG PO TABS: 650.0000 mg | ORAL_TABLET | ORAL | Status: DC | PRN

## 2017-12-21 MED ORDER — HEPARIN (PORCINE) IN NACL 1000-0.9 UT/500ML-% IV SOLN
INTRAVENOUS | Status: AC
  Filled 2017-12-21: qty 1000

## 2017-12-21 SURGICAL SUPPLY — 11 items
CATH AMP RT 5F (CATHETERS) ×3 IMPLANT
CATH INFINITI 5 FR 3DRC (CATHETERS) ×3 IMPLANT
CATH INFINITI 5FR ANG PIGTAIL (CATHETERS) ×3 IMPLANT
CATH INFINITI 5FR JL4 (CATHETERS) ×3 IMPLANT
CATH INFINITI JR4 5F (CATHETERS) ×3 IMPLANT
DEVICE CLOSURE MYNXGRIP 5F (Vascular Products) ×3 IMPLANT
KIT MANI 3VAL PERCEP (MISCELLANEOUS) ×3 IMPLANT
NEEDLE PERC 18GX7CM (NEEDLE) ×3 IMPLANT
PACK CARDIAC CATH (CUSTOM PROCEDURE TRAY) ×3 IMPLANT
SHEATH AVANTI 5FR X 11CM (SHEATH) ×3 IMPLANT
WIRE GUIDERIGHT .035X150 (WIRE) ×3 IMPLANT

## 2017-12-21 NOTE — Progress Notes (Signed)
Dr. Saralyn Pilar in at bedside speaking with pt. And her family re: cath results. All family members verbalize understanding.

## 2017-12-22 ENCOUNTER — Encounter: Payer: Self-pay | Admitting: Cardiology

## 2017-12-27 IMAGING — RF DG LUMBAR SPINE 2-3V
1 series · 4 of 4 positions shown · non-contrast
Comparison: None.

CLINICAL DATA: Lumbar disc disease.

EXAM:
DG C-ARM 61-120 MIN; LUMBAR SPINE - 2-3 VIEW

[Series 1: run · 4 of 4 slices shown]
[im 1/4]
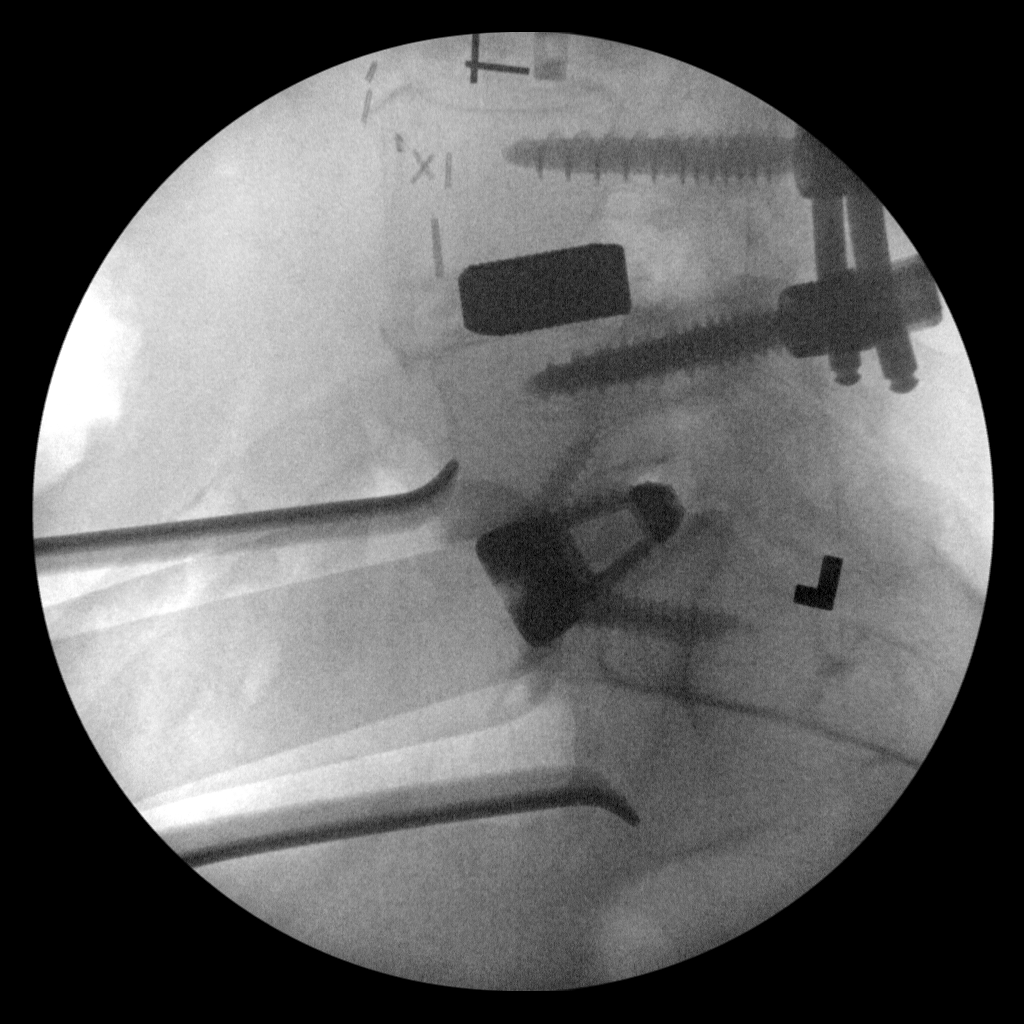
[im 2/4]
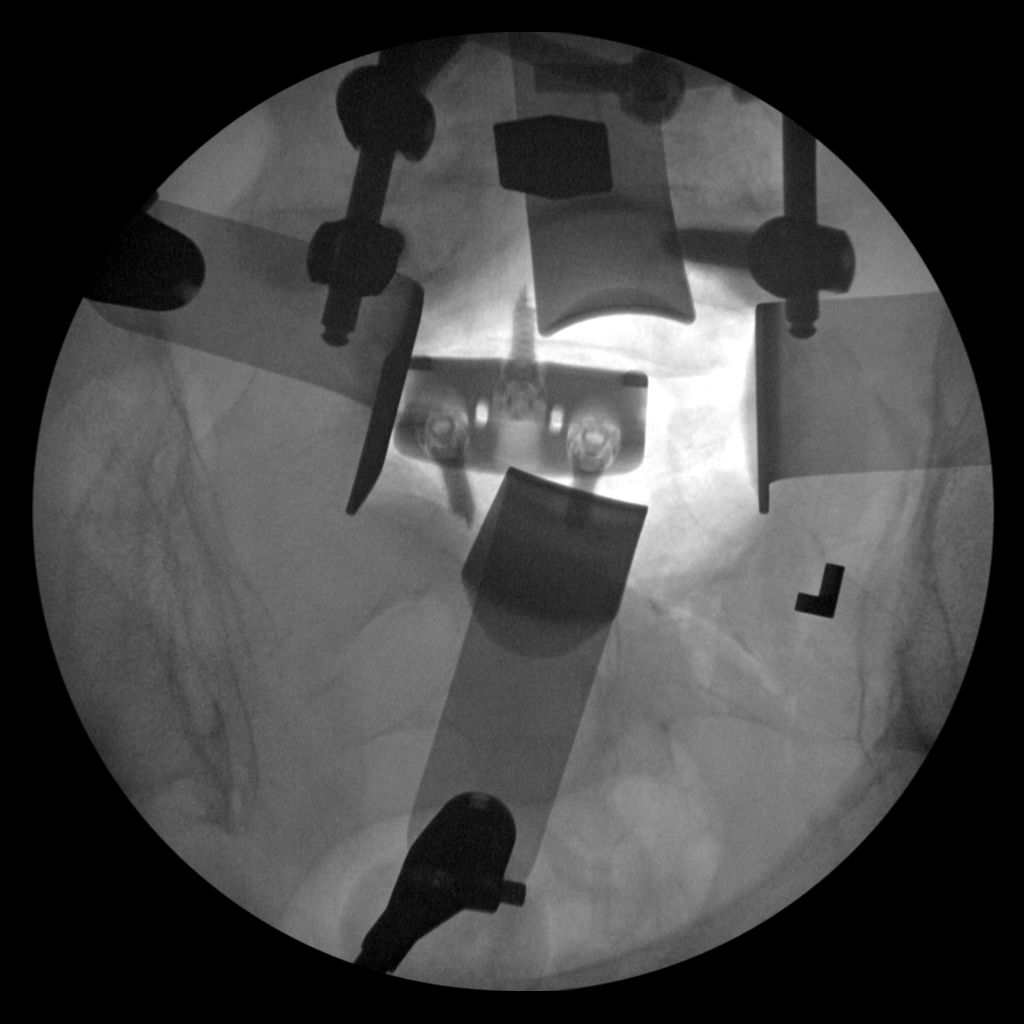
[im 3/4]
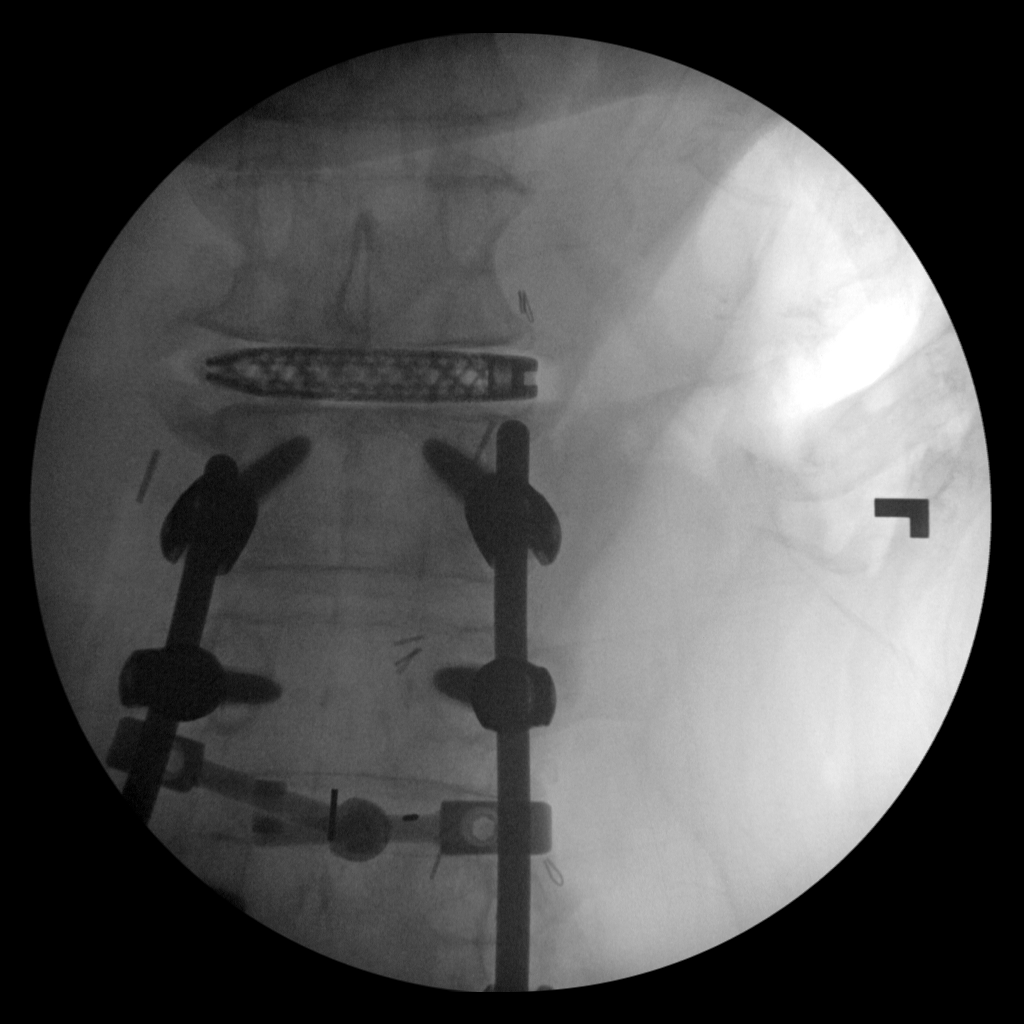
[im 4/4]
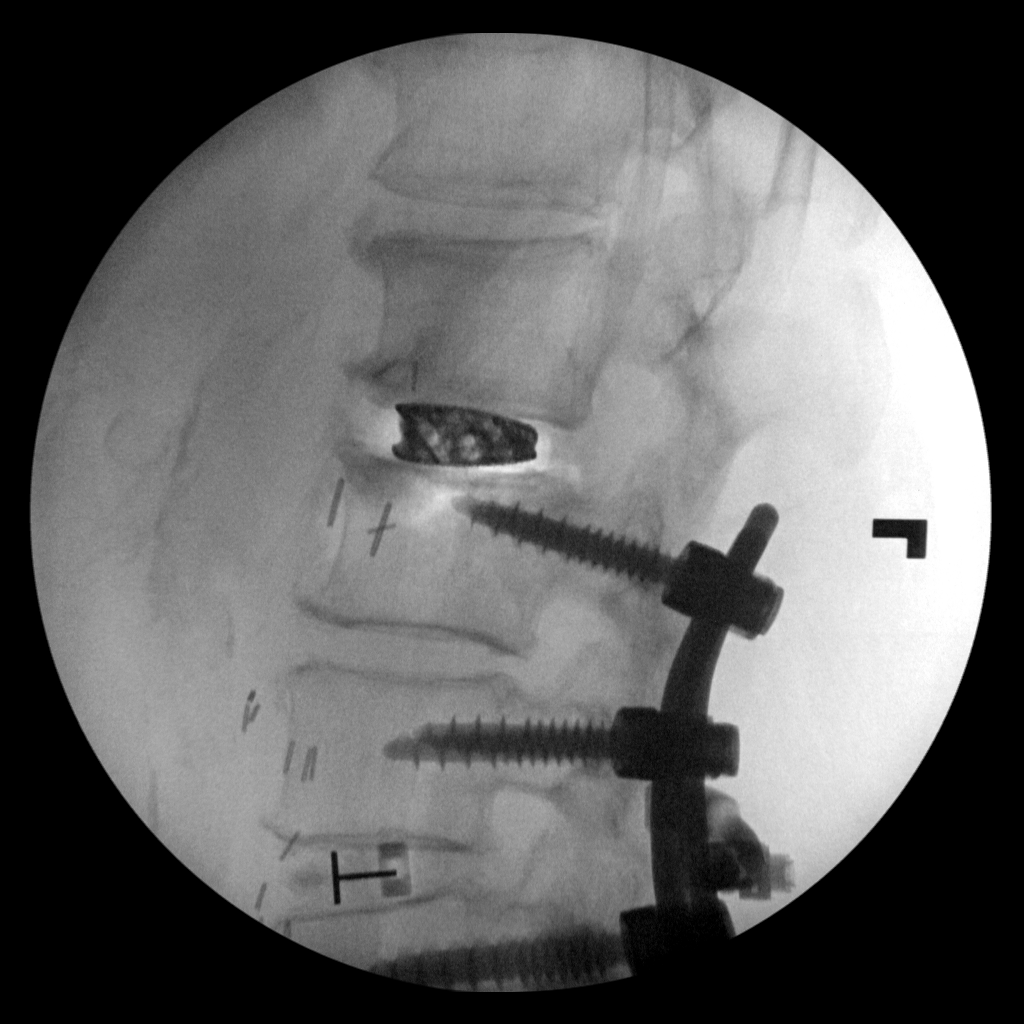

[4 of 4 positions shown; findings below may reference images not displayed]

FINDINGS: AP and lateral C-arm images demonstrate the patient undergoing
anterior fusion at L5-S1 and interbody fusion at L1-2. Hardware
devices appear in good position in the AP and lateral projections.
IMPRESSION: Anterior interbody fusion performed at L5-S1. Interbody fusion
performed at L1-2.

## 2017-12-30 IMAGING — CR DG CHEST 1V PORT
1 series · 1 of 1 positions shown · non-contrast
Comparison: 01/28/2015

CLINICAL DATA: Central line placement

EXAM:
PORTABLE CHEST 1 VIEW

[AP]
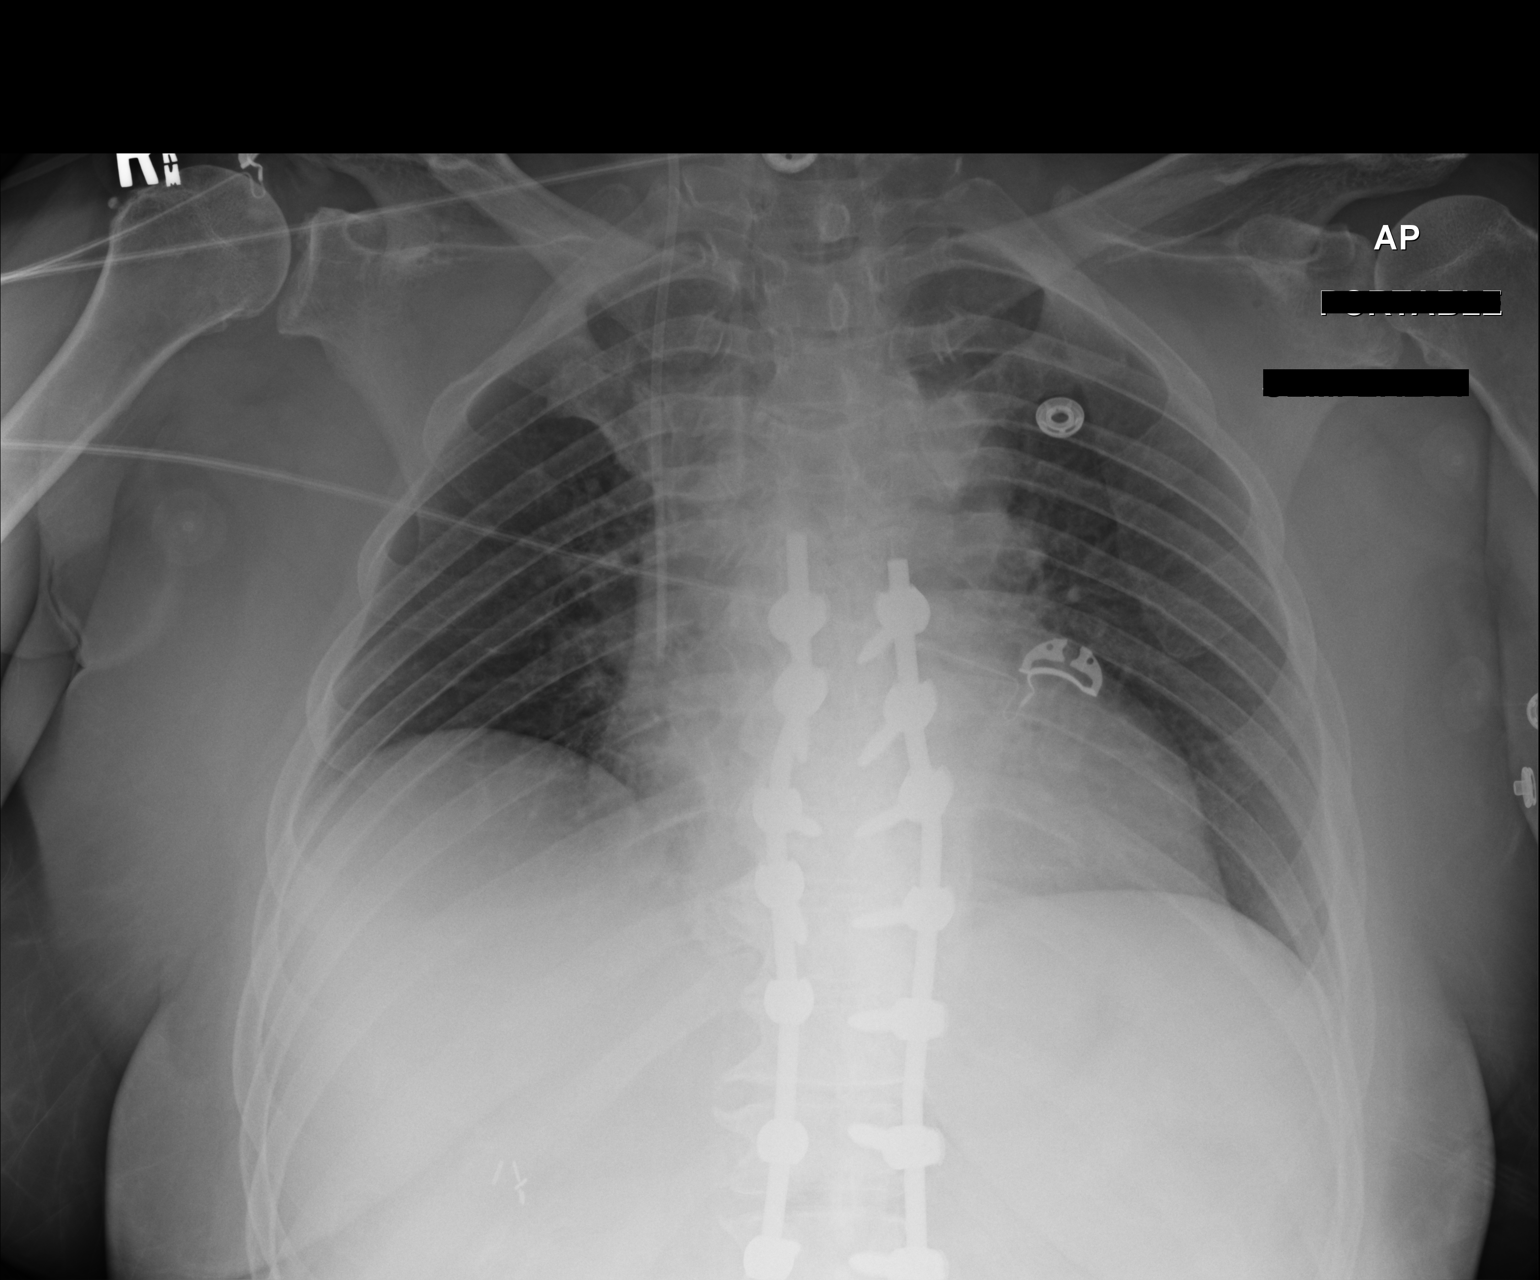

[1 of 1 positions shown; findings below may reference images not displayed]

FINDINGS: Cardiomediastinal silhouette is stable. No infiltrate or pulmonary
edema. Metallic fixation rods and transpedicular screws noted lower
thoracic and lumbar spine. No infiltrate or pulmonary edema. Mild
elevation of the right hemidiaphragm. There is right IJ central line
with tip in cavoatrial junction. No evidence of pneumothorax.
IMPRESSION: Right IJ central line with tip in cavoatrial junction. No evidence
of pneumothorax.

## 2018-01-03 IMAGING — DX DG SCOLIOSIS EVAL COMPLETE SPINE 2-3V
2 series · 7 of 7 positions shown · non-contrast
Comparison: 06/03/2016 scoliosis radiographs. 06/02/2016
intraoperative lumbar spine fluoroscopic images.

ADDENDUM:
Additional images were later added 2 this same accession number. The
lumbosacral junction and pelvis are more fully visualized on these
additional images. The instrumentation in this region also appears
intact. Four degrees left convex curvature from from T12-L5, similar
to the 06/03/2016 study.
CLINICAL DATA: Scoliosis.

EXAM:
DG SCOLIOSIS EVAL COMPLETE SPINE 2-3V

[Series 1: whole body ap · 0.14mm/px · 3 of 3 slices shown]
[im 1/3]
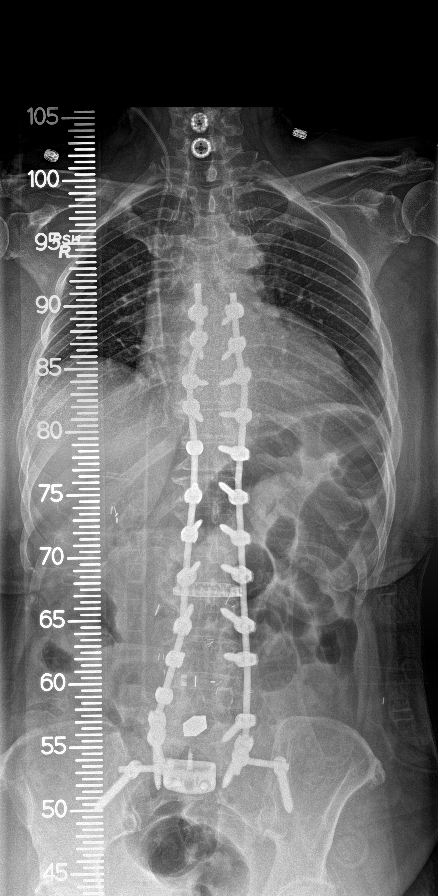
[im 2/3]
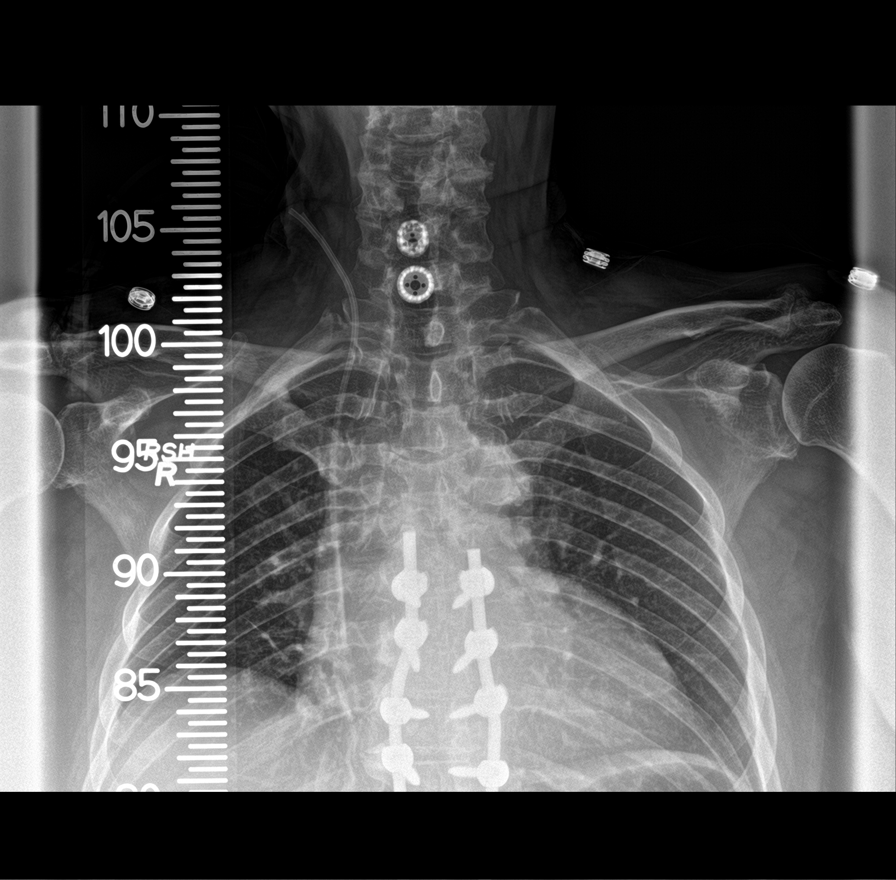
[im 3/3]
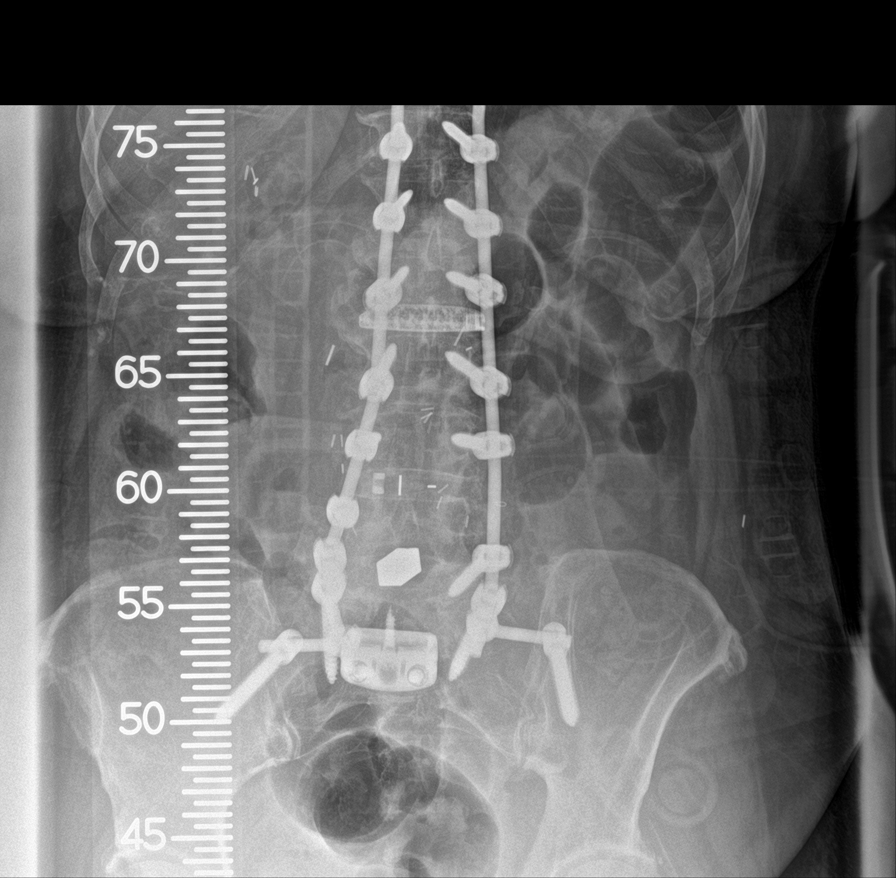

[Series 2: whole body lat · 0.14mm/px · 4 of 4 slices shown]
[im 1/4]
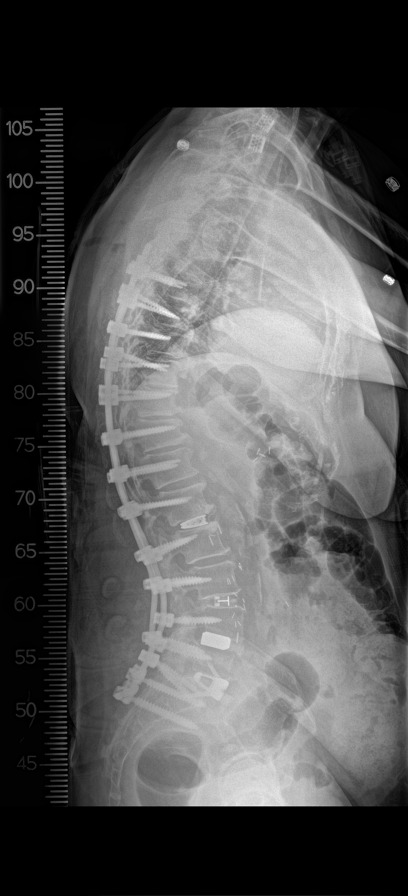
[im 2/4]
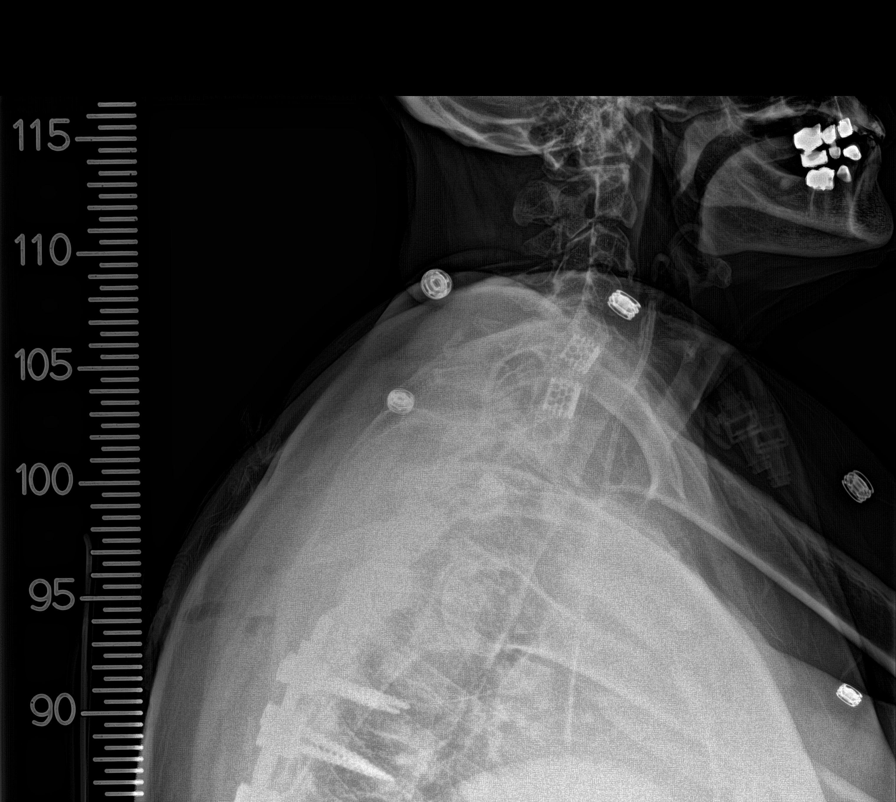
[im 3/4]
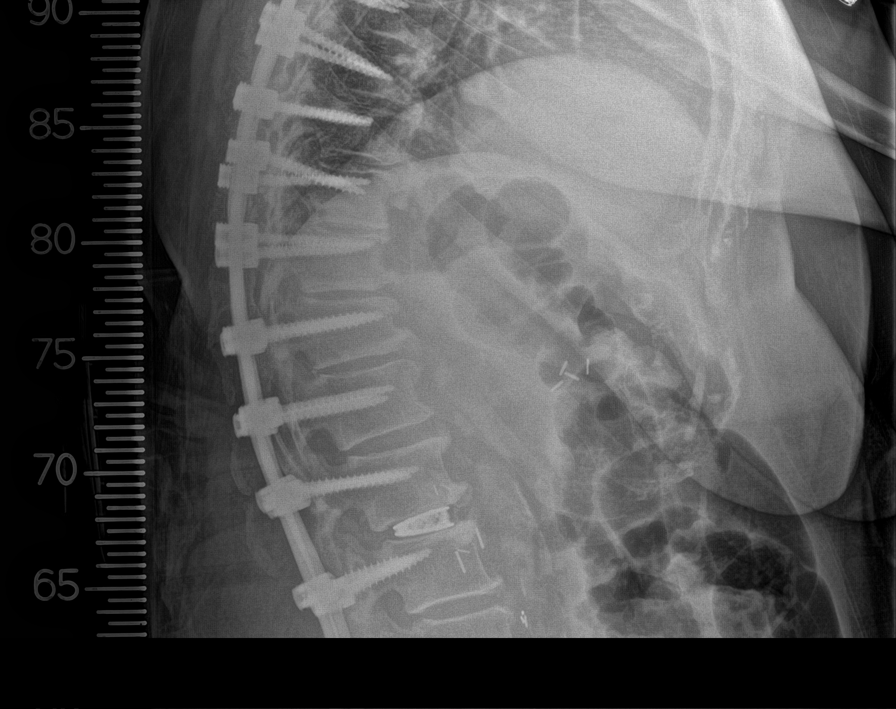
[im 4/4]
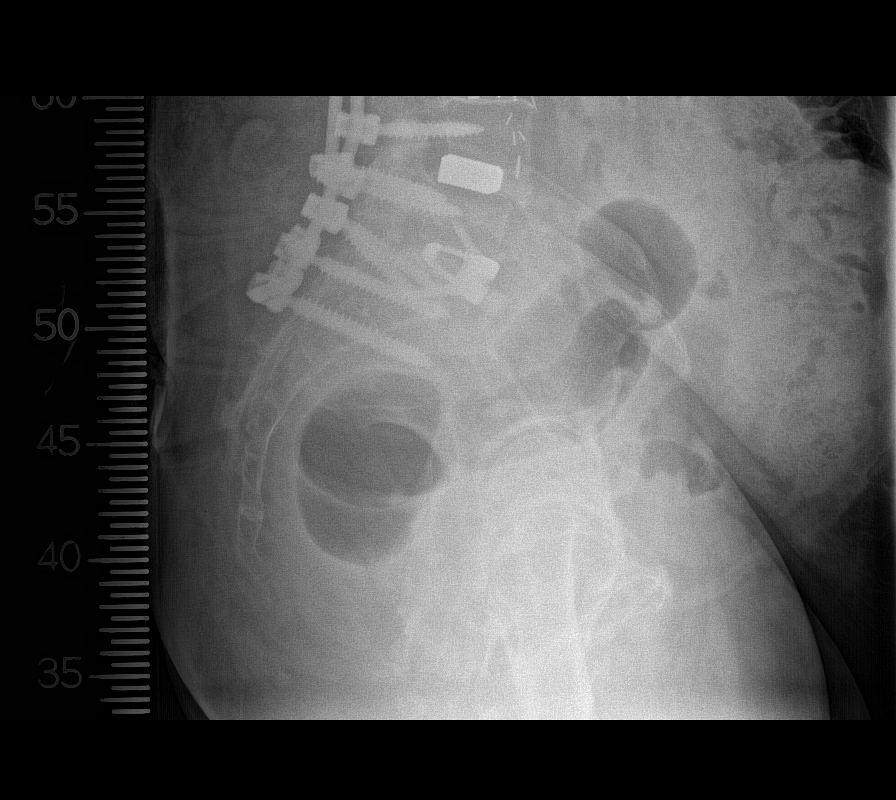

[7 of 7 positions shown; findings below may reference images not displayed]

FINDINGS: Numbering as on the prior radiographs. There are no ribs at T12.
Since the prior radiographs, the posterior spinal fusion has been
extended. The fusion now extends from T6 to the sacrum/ pelvis.
Bilateral pedicle screws are present at each level with the
exception of a unilateral right-sided screw at L4. Interbody fusion
devices are present at L1-2, L3-4, and L4-5. Sequelae of anterior
fusion are again identified at L5-S1. There is minimal fused
anterolisthesis of L4 on L5. No significant scoliosis is apparent in
the coronal plane. Surgical clips are present in the abdomen/
retroperitoneum. Mild gaseous distention of small and large bowel
loops is partially visualized. A right jugular catheter terminates
over the lower SVC.
IMPRESSION: Interval extension of posterior spinal fusion as above without
evidence of acute complication.

## 2018-01-25 ENCOUNTER — Encounter: Payer: Self-pay | Admitting: Internal Medicine

## 2018-02-07 ENCOUNTER — Telehealth: Payer: Self-pay | Admitting: Internal Medicine

## 2018-02-07 NOTE — Telephone Encounter (Signed)
Pt was under the impression was getting device done on 11/11 As daughter made arrangements to come in from [place] to help pt after procedure. Will forward to Dr Rayann Heman for review .Adonis Housekeeper

## 2018-02-07 NOTE — Telephone Encounter (Signed)
° °  Patient has questions about upcoming procedure. She is unsure of the name of test and instructions

## 2018-02-07 NOTE — Telephone Encounter (Signed)
Attempted to call pt and phone just rings no voice mail set up to leave message.Will try later .Adonis Housekeeper

## 2018-02-09 NOTE — Telephone Encounter (Signed)
As per my office note:  "Follow-up with Dr Saralyn Pilar for cath as planned. I am happy to discuss further once cath results are obtained. It would also be reasonable for her to follow-up with Dr Caryl Comes in [place] if this is preferable for her."  Her cath is without obstructive CAD and PCI was note required.  I do not know where suggestions of device implant 02/14/18 came from.  I would need to see her back in the office for further discussions.  An alternative is that Dr Caryl Comes could see her in [place] to evaluate for the procedure.  He is in the EP lab 02/14/18.  I am not aware of any conversations between Drs Caryl Comes and Paraschos and do not see that she has been scheduled for him on this date.  Sonia Baller, please follow-up with her.

## 2018-02-10 NOTE — Telephone Encounter (Signed)
Pt has f/u scheduled for 02/14/2018 with Dr. Rayann Heman to discuss ICD.  No action needed at this time.

## 2018-02-14 ENCOUNTER — Ambulatory Visit (INDEPENDENT_AMBULATORY_CARE_PROVIDER_SITE_OTHER): Payer: Medicare Other | Admitting: Internal Medicine

## 2018-02-14 ENCOUNTER — Encounter: Payer: Self-pay | Admitting: Internal Medicine

## 2018-02-14 VITALS — BP 126/60 | HR 68 | Ht 60.0 in | Wt 167.6 lb

## 2018-02-14 DIAGNOSIS — I472 Ventricular tachycardia: Secondary | ICD-10-CM

## 2018-02-14 DIAGNOSIS — I6523 Occlusion and stenosis of bilateral carotid arteries: Secondary | ICD-10-CM | POA: Diagnosis not present

## 2018-02-14 DIAGNOSIS — R55 Syncope and collapse: Secondary | ICD-10-CM

## 2018-02-14 DIAGNOSIS — I4729 Other ventricular tachycardia: Secondary | ICD-10-CM

## 2018-02-14 DIAGNOSIS — I428 Other cardiomyopathies: Secondary | ICD-10-CM

## 2018-02-14 NOTE — Patient Instructions (Addendum)
Medication Instructions:  Your physician recommends that you continue on your current medications as directed. Please refer to the Current Medication list given to you today.  Labwork: You will get lab work today:  BMP and CBC.  Testing/Procedures: Your physician has recommended that you have a defibrillator inserted. An implantable cardioverter defibrillator (ICD) is a small device that is placed in your chest or, in rare cases, your abdomen. This device uses electrical pulses or shocks to help control life-threatening, irregular heartbeats that could lead the heart to suddenly stop beating (sudden cardiac arrest). Leads are attached to the ICD that goes into your heart. This is done in the hospital and usually requires an overnight stay. Please see the instruction sheet given to you today for more information.  Follow-Up: You will follow up with device clinic 10-14 days after your procedure for a wound check.  You will follow up with Dr. Rayann Heman 91 days after your procedure.   INSTRUCTION FOR DEFIBRILLATOR PLACEMENT:  Please arrive to ADMITTING down the hall from the [facility] main entrance of [place] hospital at:  11:30 am on March 08, 2018 Use the CHG surgical scrub as directed Do not eat or drink after midnight prior to procedure Do not take any medications the morning of the procedure Do NOT TAKE your ASPIRIN for 5 days prior to your procedure Plan for one night stay You will need someone to drive you home at discharge  If you need a refill on your cardiac medications before your next appointment, please call your pharmacy.    Cardioverter Defibrillator Implantation An implantable cardioverter defibrillator (ICD) is a small device that is placed under the skin in the chest or abdomen. An ICD consists of a battery, a small computer (pulse generator), and wires (leads) that go into the heart. An ICD is used to detect and correct two types of dangerous irregular heartbeats  (arrhythmias):  A rapid heart rhythm (tachycardia).  An arrhythmia in which the lower chambers of the heart (ventricles) contract in an uncoordinated way (fibrillation).  When an ICD detects tachycardia, it sends a low-energy shock to the heart to restore the heartbeat to normal (cardioversion). This signal is usually painless. If cardioversion does not work or if the ICD detects fibrillation, it delivers a high-energy shock to the heart (defibrillation) to restart the heart. This shock may feel like a strong jolt in the chest. Your health care provider may prescribe an ICD if:  You have had an arrhythmia that originated in the ventricles.  Your heart has been damaged by a disease or heart condition.  Sometimes, ICDs are programmed to act as a device called a pacemaker. Pacemakers can be used to treat a slow heartbeat (bradycardia) or tachycardia by taking over the heart rate with electrical impulses. Tell a health care provider about:  Any allergies you have.  All medicines you are taking, including vitamins, herbs, eye drops, creams, and over-the-counter medicines.  Any problems you or family members have had with anesthetic medicines.  Any blood disorders you have.  Any surgeries you have had.  Any medical conditions you have.  Whether you are pregnant or may be pregnant. What are the risks? Generally, this is a safe procedure. However, problems may occur, including:  Swelling, bleeding, or bruising.  Infection.  Blood clots.  Damage to other structures or organs, such as nerves, blood vessels, or the heart.  Allergic reactions to medicines used during the procedure.  What happens before the procedure? Staying hydrated  Follow instructions from your health care provider about hydration, which may include:  Up to 2 hours before the procedure - you may continue to drink clear liquids, such as water, clear fruit juice, black coffee, and plain tea.  Eating and drinking  restrictions Follow instructions from your health care provider about eating and drinking, which may include:  8 hours before the procedure - stop eating heavy meals or foods such as meat, fried foods, or fatty foods.  6 hours before the procedure - stop eating light meals or foods, such as toast or cereal.  6 hours before the procedure - stop drinking milk or drinks that contain milk.  2 hours before the procedure - stop drinking clear liquids.  Medicine Ask your health care provider about:  Changing or stopping your normal medicines. This is important if you take diabetes medicines or blood thinners.  Taking medicines such as aspirin and ibuprofen. These medicines can thin your blood. Do not take these medicines before your procedure if your doctor tells you not to.  Tests  You may have blood tests.  You may have a test to check the electrical signals in your heart (electrocardiogram, ECG).  You may have imaging tests, such as a chest X-ray. General instructions  For 24 hours before the procedure, stop using products that contain nicotine or tobacco, such as cigarettes and e-cigarettes. If you need help quitting, ask your health care provider.  Plan to have someone take you home from the hospital or clinic.  You may be asked to shower with a germ-killing soap. What happens during the procedure?  To reduce your risk of infection: ? Your health care team will wash or sanitize their hands. ? Your skin will be washed with soap. ? Hair may be removed from the surgical area.  Small monitors will be put on your body. They will be used to check your heart, blood pressure, and oxygen level.  An IV tube will be inserted into one of your veins.  You will be given one or more of the following: ? A medicine to help you relax (sedative). ? A medicine to numb the area (local anesthetic). ? A medicine to make you fall asleep (general anesthetic).  Leads will be guided through a  blood vessel into your heart and attached to your heart muscles. Depending on the ICD, the leads may go into one ventricle or they may go into both ventricles and into an upper chamber of the heart. An X-ray machine (fluoroscope) will be usedto help guide the leads.  A small incision will be made to create a deep pocket under your skin.  The pulse generator will be placed into the pocket.  The ICD will be tested.  The incision will be closed with stitches (sutures), skin glue, or staples.  A bandage (dressing) will be placed over the incision. This procedure may vary among health care providers and hospitals. What happens after the procedure?  Your blood pressure, heart rate, breathing rate, and blood oxygen level will be monitored often until the medicines you were given have worn off.  A chest X-ray will be taken to check that the ICD is in the right place.  You will need to stay in the hospital for 1-2 days so your health care provider can make sure your ICD is working.  Do not drive for 24 hours if you received a sedative. Ask your health care provider when it is safe for you to drive.  You  may be given an identification card explaining that you have an ICD. Summary  An implantable cardioverter defibrillator (ICD) is a small device that is placed under the skin in the chest or abdomen. It is used to detect and correct dangerous irregular heartbeats (arrhythmias).  An ICD consists of a battery, a small computer (pulse generator), and wires (leads) that go into the heart.  When an ICD detects rapid heart rhythm (tachycardia), it sends a low-energy shock to the heart to restore the heartbeat to normal (cardioversion). If cardioversion does not work or if the ICD detects uncoordinated heart contractions (fibrillation), it delivers a high-energy shock to the heart (defibrillation) to restart the heart.  You will need to stay in the hospital for 1-2 days to make sure your ICD is  working. This information is not intended to replace advice given to you by your health care provider. Make sure you discuss any questions you have with your health care provider. Document Released: 12/13/2001 Document Revised: 04/01/2016 Document Reviewed: 04/01/2016 Elsevier Interactive Patient Education  2017 [employer].

## 2018-02-14 NOTE — H&P (View-Only) (Signed)
PCP: Idelle Crouch, MD Primary Cardiologist: Dr Saralyn Pilar Primary EP: Dr Rayann Heman  Allison Mclean is a 82 y.o. female who presents today for routine electrophysiology followup.  Since last being seen in our clinic, the patient reports doing very well.  She had cath by Dr Saralyn Pilar which showed no obstructive CAD requiring intervention.  Today, she denies symptoms of palpitations, chest pain, shortness of breath,  lower extremity edema, dizziness, presyncope, or syncope.  The patient is otherwise without complaint today.   Past Medical History:  Diagnosis Date  . Abnormal MRI, thoracic spine 07/02/2015   IMPRESSION: 1. 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 Subsequent moderate to severe adjacent segment facet arthropathy. 2. Widespread thoracic spine disc bulging. Occasional small superimposed disc protrusions (T8-9, T11-12). 3. Subsequent multifactorial mild thoracic spinal stenosis T7-8, T8-9, T9-10, T10-11, T11-12, and T12-L1. Up to mild spi  . Acid reflux 11/22/2014  . 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 Allergy   . Chronic thoracic radicular pain (T5/T6) (Left) 07/02/2015  . Essential (primary) hypertension 12/23/2013  . GERD (gastroesophageal reflux disease)   . Heart murmur   . History of fusion of cervical spine 07/02/2015  . History of hiatal hernia    s/p  . History of kidney stones   . History of lumbar fusion (12/19/2013) (posterior lumbar fusion from L2-L5) 07/02/2015   Pedicle screws and posterior hardware at L5 L4-L3 and L2. Neil disc block at the L4-5 disc space. Slight anterior subluxation of L4 on L5. Different configuration and this had L3-4. Degenerative changes of the lumbar disc spaces. L5-S1 disc space well-maintained .   Marland Kitchen HLD (hyperlipidemia) 11/07/2013  . Hyperlipidemia   . Hypertension   . Infection    abcess in left upper tooth   . Jackhammer esophagus   . Nerve root pain 07/02/2015   Thoracic pain is likely to be  secondary to severe left T5 foraminal stenosis.   . Neuropathic pain 08/05/2015  . Opiate use (10 MME/day) 07/02/2015  . Osteopenia, senile 07/02/2015  . Supraventricular tachycardia ([place]) 11/07/2013  . Thoracic central spinal stenosis (T7-8, T8-9, T9-10, T10-11, T11-12, and T12-L1) 07/02/2015  . Thoracic facet syndrome (Left) 07/02/2015  . Thoracic foraminal stenosis (Moderate to severe at: Right T5; Bilateral T9; Left T11) 07/02/2015  . Thoracic spondylosis with radiculopathy (Left) 07/09/2015  . Wears dentures    partial lower   Past Surgical History:  Procedure Laterality Date  . ABDOMINAL EXPOSURE N/A 06/02/2016   Procedure: ABDOMINAL EXPOSURE;  Surgeon: Angelia Mould, MD;  Location: [place];  Service: Vascular;  Laterality: N/A;  . ABDOMINAL HYSTERECTOMY    . ANTERIOR LAT LUMBAR FUSION N/A 06/02/2016   Procedure: L1-2 Left Anterior lateral lumbar interbody fusion;  Surgeon: Erline Levine, MD;  Location: [place];  Service: Neurosurgery;  Laterality: N/A;  L1-2 Anterior lateral lumbar interbody fusion  . ANTERIOR LUMBAR FUSION N/A 06/02/2016   Procedure: Lumbar five -sacral one Anterior lumbar interbody fusion with Dr. Deitra Mayo; Lumbar one-two Left Anterior lateral lumbar interbody fusion;  Surgeon: Erline Levine, MD;  Location: [place];  Service: Neurosurgery;  Laterality: N/A;  . APPENDECTOMY    . APPLICATION OF INTRAOPERATIVE CT SCAN N/A 06/05/2016   Procedure: APPLICATION OF INTRAOPERATIVE CT SCAN;  Surgeon: Erline Levine, MD;  Location: [facility];  Service: Neurosurgery;  Laterality: N/A;  . BACK SURGERY     2005, 2006, 2015  . CARDIAC CATHETERIZATION N/A 02/07/2015   Procedure: Left  Heart Cath and Coronary Angiography;  Surgeon: Teodoro Spray, MD;  Location: [facility];  Service: Cardiovascular;  Laterality: N/A;  . CHOLECYSTECTOMY    . ESOPHAGOGASTRODUODENOSCOPY (EGD) WITH PROPOFOL N/A 02/03/2016   Procedure: ESOPHAGOGASTRODUODENOSCOPY (EGD) WITH PROPOFOL;  Surgeon: Lucilla Lame, MD;  Location: [facility];  Service: Endoscopy;  Laterality: N/A;  . ESOPHAGOGASTRODUODENOSCOPY (EGD) WITH PROPOFOL N/A 03/02/2016   Procedure: ESOPHAGOGASTRODUODENOSCOPY (EGD) WITH PROPOFOL;  Surgeon: Lucilla Lame, MD;  Location: [facility];  Service: Endoscopy;  Laterality: N/A;  . LEFT HEART CATH AND CORONARY ANGIOGRAPHY Left 12/21/2017   Procedure: LEFT HEART CATH AND CORONARY ANGIOGRAPHY;  Surgeon: Isaias Cowman, MD;  Location: [facility];  Service: Cardiovascular;  Laterality: Left;  . NISSEN FUNDOPLICATION    . POSTERIOR LUMBAR FUSION 4 LEVEL N/A 06/05/2016   Procedure: Thoracic six to pelvis fixation with Smith-Peterson Thoracic osteotomies at Thoracic ten-Thoracic Twelve with AIRO, Removal hardware lumbar two to lumbar five;  Surgeon: Erline Levine, MD;  Location: [facility];  Service: Neurosurgery;  Laterality: N/A;  . SPINAL GROWTH RODS Bilateral 2018  . SYMPATHECTOMY     ? improve blood flow to legs  . TONSILECTOMY, ADENOIDECTOMY, BILATERAL MYRINGOTOMY AND TUBES      ROS- all systems are reviewed and negatives except as per HPI above  Current Outpatient Medications  Medication Sig Dispense Refill  . amiodarone (PACERONE) 200 MG tablet Take 200 mg by mouth daily.     Marland Kitchen amLODipine (NORVASC) 2.5 MG tablet Take 2.5 mg by mouth daily.     Marland Kitchen aspirin EC 81 MG tablet Take 81 mg by mouth at bedtime.     . calcium citrate-vitamin D (CITRACAL+D) 315-200 MG-UNIT tablet Take 1 tablet by mouth daily.     . clobetasol ointment (TEMOVATE) 7.56 % Apply 1 application topically 2 (two) times daily as needed (sore areas). Avoid face and skin folds  1  . fluticasone (FLONASE) 50 MCG/ACT nasal spray Place 2 sprays into both nostrils at bedtime as needed for allergies.     . Multiple Vitamins-Minerals (OCUVITE EXTRA PO) Take 1 tablet by mouth daily.     Marland Kitchen nystatin (NYSTATIN) powder Apply 2 g topically daily.    . potassium chloride SA (K-DUR,KLOR-CON) 20 MEQ tablet Take  20 mEq by mouth daily.     . pravastatin (PRAVACHOL) 80 MG tablet Take 80 mg by mouth at bedtime.  3  . sennosides-docusate sodium (SENOKOT-S) 8.6-50 MG tablet Take 1 tablet by mouth daily as needed for constipation.    . traZODone (DESYREL) 50 MG tablet Take 50 mg by mouth at bedtime.    . valsartan-hydrochlorothiazide (DIOVAN-HCT) 160-12.5 MG per tablet Take 1 tablet by mouth daily.  3   No current facility-administered medications for this visit.     Physical Exam: Vitals:   02/14/18 1410  BP: 126/60  Pulse: 68  SpO2: 96%  Weight: 167 lb 9.6 oz (76 kg)  Height: 5' (1.524 m)    GEN- The patient is well appearing, alert and oriented x 3 today.   Head- normocephalic, atraumatic Eyes-  Sclera clear, conjunctiva pink Ears- hearing intact Oropharynx- clear Lungs- Clear to ausculation bilaterally, normal work of breathing Heart- Regular rate and rhythm, no murmurs, rubs or gallops, PMI not laterally displaced GI- soft, NT, ND, + BS Extremities- no clubbing, cyanosis, or edema  Wt Readings from Last 3 Encounters:  02/14/18 167 lb 9.6 oz (76 kg)  12/21/17 167 lb (75.8 kg)  12/08/17 169 lb (  76.7 kg)    EKG tracing ordered today is personally reviewed and shows sinus rhythm 68 bpm, PR 172 msec, QRS 112 msec, QTc 487 msec, LAHB, repolarization abnormality  Cath by Dr Saralyn Pilar- nonobstructive CAD, EF 29%  Assessment and Plan: 1. The patient has a nonischemic CM (EF 29%), NYHA Class III CHF, sick sinus syndrome, and syncope.  She meets criteria for ICD implantation for secondary prevention of ventricular arrhythmias given structural heart disease, NSVT and syncope.   She has QRS < 130 msec and does not meet criteria for CRT.  Per Dr Saralyn Pilar, she has sick sinus syndrome and may benefit from an atrial lead for chronic issues with sinus bradycardia also.   I have had a thorough discussion with the patient reviewing options.  The patient and their family (if available) have had  opportunities to ask questions and have them answered. The patient and I have decided together through a shared decision making process to dual chamber ICD at this time.   Risks, benefits, alternatives to ICD implantation were discussed in detail with the patient today. The patient understands that the risks include but are not limited to bleeding, infection, pneumothorax, perforation, tamponade, vascular damage, renal failure, MI, stroke, death, inappropriate shocks, and lead dislodgement and wishes to proceed.  We will therefore schedule device implantation at the next available time.  Thompson Grayer MD, [facility] 02/14/2018 2:42 PM

## 2018-02-14 NOTE — Progress Notes (Signed)
PCP: Idelle Crouch, MD Primary Cardiologist: Dr Saralyn Pilar Primary EP: Dr Rayann Heman  Allison Mclean is a 82 y.o. female who presents today for routine electrophysiology followup.  Since last being seen in our clinic, the patient reports doing very well.  She had cath by Dr Saralyn Pilar which showed no obstructive CAD requiring intervention.  Today, she denies symptoms of palpitations, chest pain, shortness of breath,  lower extremity edema, dizziness, presyncope, or syncope.  The patient is otherwise without complaint today.   Past Medical History:  Diagnosis Date  . Abnormal MRI, thoracic spine 07/02/2015   IMPRESSION: 1. 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 Subsequent moderate to severe adjacent segment facet arthropathy. 2. Widespread thoracic spine disc bulging. Occasional small superimposed disc protrusions (T8-9, T11-12). 3. Subsequent multifactorial mild thoracic spinal stenosis T7-8, T8-9, T9-10, T10-11, T11-12, and T12-L1. Up to mild spi  . Acid reflux 11/22/2014  . 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 Allergy   . Chronic thoracic radicular pain (T5/T6) (Left) 07/02/2015  . Essential (primary) hypertension 12/23/2013  . GERD (gastroesophageal reflux disease)   . Heart murmur   . History of fusion of cervical spine 07/02/2015  . History of hiatal hernia    s/p  . History of kidney stones   . History of lumbar fusion (12/19/2013) (posterior lumbar fusion from L2-L5) 07/02/2015   Pedicle screws and posterior hardware at L5 L4-L3 and L2. Neil disc block at the L4-5 disc space. Slight anterior subluxation of L4 on L5. Different configuration and this had L3-4. Degenerative changes of the lumbar disc spaces. L5-S1 disc space well-maintained .   Marland Kitchen HLD (hyperlipidemia) 11/07/2013  . Hyperlipidemia   . Hypertension   . Infection    abcess in left upper tooth   . Jackhammer esophagus   . Nerve root pain 07/02/2015   Thoracic pain is likely to be  secondary to severe left T5 foraminal stenosis.   . Neuropathic pain 08/05/2015  . Opiate use (10 MME/day) 07/02/2015  . Osteopenia, senile 07/02/2015  . Supraventricular tachycardia ([place]) 11/07/2013  . Thoracic central spinal stenosis (T7-8, T8-9, T9-10, T10-11, T11-12, and T12-L1) 07/02/2015  . Thoracic facet syndrome (Left) 07/02/2015  . Thoracic foraminal stenosis (Moderate to severe at: Right T5; Bilateral T9; Left T11) 07/02/2015  . Thoracic spondylosis with radiculopathy (Left) 07/09/2015  . Wears dentures    partial lower   Past Surgical History:  Procedure Laterality Date  . ABDOMINAL EXPOSURE N/A 06/02/2016   Procedure: ABDOMINAL EXPOSURE;  Surgeon: Angelia Mould, MD;  Location: [facility];  Service: Vascular;  Laterality: N/A;  . ABDOMINAL HYSTERECTOMY    . ANTERIOR LAT LUMBAR FUSION N/A 06/02/2016   Procedure: L1-2 Left Anterior lateral lumbar interbody fusion;  Surgeon: Erline Levine, MD;  Location: [facility];  Service: Neurosurgery;  Laterality: N/A;  L1-2 Anterior lateral lumbar interbody fusion  . ANTERIOR LUMBAR FUSION N/A 06/02/2016   Procedure: Lumbar five -sacral one Anterior lumbar interbody fusion with Dr. Deitra Mayo; Lumbar one-two Left Anterior lateral lumbar interbody fusion;  Surgeon: Erline Levine, MD;  Location: [facility];  Service: Neurosurgery;  Laterality: N/A;  . APPENDECTOMY    . APPLICATION OF INTRAOPERATIVE CT SCAN N/A 06/05/2016   Procedure: APPLICATION OF INTRAOPERATIVE CT SCAN;  Surgeon: Erline Levine, MD;  Location: [facility];  Service: Neurosurgery;  Laterality: N/A;  . BACK SURGERY     2005, 2006, 2015  . CARDIAC CATHETERIZATION N/A 02/07/2015   Procedure: Left  Heart Cath and Coronary Angiography;  Surgeon: Teodoro Spray, MD;  Location: [facility];  Service: Cardiovascular;  Laterality: N/A;  . CHOLECYSTECTOMY    . ESOPHAGOGASTRODUODENOSCOPY (EGD) WITH PROPOFOL N/A 02/03/2016   Procedure: ESOPHAGOGASTRODUODENOSCOPY (EGD) WITH PROPOFOL;  Surgeon: Lucilla Lame, MD;  Location: [facility];  Service: Endoscopy;  Laterality: N/A;  . ESOPHAGOGASTRODUODENOSCOPY (EGD) WITH PROPOFOL N/A 03/02/2016   Procedure: ESOPHAGOGASTRODUODENOSCOPY (EGD) WITH PROPOFOL;  Surgeon: Lucilla Lame, MD;  Location: [facility];  Service: Endoscopy;  Laterality: N/A;  . LEFT HEART CATH AND CORONARY ANGIOGRAPHY Left 12/21/2017   Procedure: LEFT HEART CATH AND CORONARY ANGIOGRAPHY;  Surgeon: Isaias Cowman, MD;  Location: [facility];  Service: Cardiovascular;  Laterality: Left;  . NISSEN FUNDOPLICATION    . POSTERIOR LUMBAR FUSION 4 LEVEL N/A 06/05/2016   Procedure: Thoracic six to pelvis fixation with Smith-Peterson Thoracic osteotomies at Thoracic ten-Thoracic Twelve with AIRO, Removal hardware lumbar two to lumbar five;  Surgeon: Erline Levine, MD;  Location: [facility];  Service: Neurosurgery;  Laterality: N/A;  . SPINAL GROWTH RODS Bilateral 2018  . SYMPATHECTOMY     ? improve blood flow to legs  . TONSILECTOMY, ADENOIDECTOMY, BILATERAL MYRINGOTOMY AND TUBES      ROS- all systems are reviewed and negatives except as per HPI above  Current Outpatient Medications  Medication Sig Dispense Refill  . amiodarone (PACERONE) 200 MG tablet Take 200 mg by mouth daily.     Marland Kitchen amLODipine (NORVASC) 2.5 MG tablet Take 2.5 mg by mouth daily.     Marland Kitchen aspirin EC 81 MG tablet Take 81 mg by mouth at bedtime.     . calcium citrate-vitamin D (CITRACAL+D) 315-200 MG-UNIT tablet Take 1 tablet by mouth daily.     . clobetasol ointment (TEMOVATE) 8.11 % Apply 1 application topically 2 (two) times daily as needed (sore areas). Avoid face and skin folds  1  . fluticasone (FLONASE) 50 MCG/ACT nasal spray Place 2 sprays into both nostrils at bedtime as needed for allergies.     . Multiple Vitamins-Minerals (OCUVITE EXTRA PO) Take 1 tablet by mouth daily.     Marland Kitchen nystatin (NYSTATIN) powder Apply 2 g topically daily.    . potassium chloride SA (K-DUR,KLOR-CON) 20 MEQ tablet Take  20 mEq by mouth daily.     . pravastatin (PRAVACHOL) 80 MG tablet Take 80 mg by mouth at bedtime.  3  . sennosides-docusate sodium (SENOKOT-S) 8.6-50 MG tablet Take 1 tablet by mouth daily as needed for constipation.    . traZODone (DESYREL) 50 MG tablet Take 50 mg by mouth at bedtime.    . valsartan-hydrochlorothiazide (DIOVAN-HCT) 160-12.5 MG per tablet Take 1 tablet by mouth daily.  3   No current facility-administered medications for this visit.     Physical Exam: Vitals:   02/14/18 1410  BP: 126/60  Pulse: 68  SpO2: 96%  Weight: 167 lb 9.6 oz (76 kg)  Height: 5' (1.524 m)    GEN- The patient is well appearing, alert and oriented x 3 today.   Head- normocephalic, atraumatic Eyes-  Sclera clear, conjunctiva pink Ears- hearing intact Oropharynx- clear Lungs- Clear to ausculation bilaterally, normal work of breathing Heart- Regular rate and rhythm, no murmurs, rubs or gallops, PMI not laterally displaced GI- soft, NT, ND, + BS Extremities- no clubbing, cyanosis, or edema  Wt Readings from Last 3 Encounters:  02/14/18 167 lb 9.6 oz (76 kg)  12/21/17 167 lb (75.8 kg)  12/08/17 169 lb (  76.7 kg)    EKG tracing ordered today is personally reviewed and shows sinus rhythm 68 bpm, PR 172 msec, QRS 112 msec, QTc 487 msec, LAHB, repolarization abnormality  Cath by Dr Saralyn Pilar- nonobstructive CAD, EF 29%  Assessment and Plan: 1. The patient has a nonischemic CM (EF 29%), NYHA Class III CHF, sick sinus syndrome, and syncope.  She meets criteria for ICD implantation for secondary prevention of ventricular arrhythmias given structural heart disease, NSVT and syncope.   She has QRS < 130 msec and does not meet criteria for CRT.  Per Dr Saralyn Pilar, she has sick sinus syndrome and may benefit from an atrial lead for chronic issues with sinus bradycardia also.   I have had a thorough discussion with the patient reviewing options.  The patient and their family (if available) have had  opportunities to ask questions and have them answered. The patient and I have decided together through a shared decision making process to dual chamber ICD at this time.   Risks, benefits, alternatives to ICD implantation were discussed in detail with the patient today. The patient understands that the risks include but are not limited to bleeding, infection, pneumothorax, perforation, tamponade, vascular damage, renal failure, MI, stroke, death, inappropriate shocks, and lead dislodgement and wishes to proceed.  We will therefore schedule device implantation at the next available time.  Thompson Grayer MD, [facility] 02/14/2018 2:42 PM

## 2018-02-15 LAB — CBC WITH DIFFERENTIAL/PLATELET
BASOS: 1 %
Basophils Absolute: 0.1 10*3/uL (ref 0.0–0.2)
EOS (ABSOLUTE): 0.1 10*3/uL (ref 0.0–0.4)
Eos: 2 %
Hematocrit: 40 % (ref 34.0–46.6)
Hemoglobin: 12.8 g/dL (ref 11.1–15.9)
Immature Grans (Abs): 0 10*3/uL (ref 0.0–0.1)
Immature Granulocytes: 0 %
Lymphocytes Absolute: 1.4 10*3/uL (ref 0.7–3.1)
Lymphs: 29 %
MCH: 27.2 pg (ref 26.6–33.0)
MCHC: 32 g/dL (ref 31.5–35.7)
MCV: 85 fL (ref 79–97)
MONOS ABS: 0.5 10*3/uL (ref 0.1–0.9)
Monocytes: 11 %
NEUTROS ABS: 2.8 10*3/uL (ref 1.4–7.0)
Neutrophils: 57 %
Platelets: 231 10*3/uL (ref 150–450)
RBC: 4.7 x10E6/uL (ref 3.77–5.28)
RDW: 14.9 % (ref 12.3–15.4)
WBC: 4.8 10*3/uL (ref 3.4–10.8)

## 2018-02-15 LAB — BASIC METABOLIC PANEL
BUN/Creatinine Ratio: 11 — ABNORMAL LOW (ref 12–28)
BUN: 11 mg/dL (ref 8–27)
CO2: 28 mmol/L (ref 20–29)
Calcium: 10.3 mg/dL (ref 8.7–10.3)
Chloride: 96 mmol/L (ref 96–106)
Creatinine, Ser: 1.03 mg/dL — ABNORMAL HIGH (ref 0.57–1.00)
GFR, EST AFRICAN AMERICAN: 59 mL/min/{1.73_m2} — AB (ref >59)
GFR, EST NON AFRICAN AMERICAN: 51 mL/min/{1.73_m2} — AB (ref >59)
Glucose: 110 mg/dL — ABNORMAL HIGH (ref 65–99)
POTASSIUM: 3.5 mmol/L (ref 3.5–5.2)
SODIUM: 138 mmol/L (ref 134–144)

## 2018-03-08 ENCOUNTER — Other Ambulatory Visit: Payer: Self-pay

## 2018-03-08 ENCOUNTER — Encounter (HOSPITAL_COMMUNITY)
Admission: RE | Disposition: A | Discharge status: Home / Self Care | Payer: Self-pay | Source: Ambulatory Visit | Attending: Internal Medicine

## 2018-03-08 ENCOUNTER — Ambulatory Visit (HOSPITAL_COMMUNITY)
Admission: RE | Admit: 2018-03-08 | Discharge: 2018-03-09 | Disposition: A | Discharge status: Home / Self Care | Payer: Medicare Other | Source: Ambulatory Visit | Attending: Internal Medicine | Admitting: Internal Medicine

## 2018-03-08 ENCOUNTER — Encounter (HOSPITAL_COMMUNITY): Payer: Self-pay | Admitting: General Practice

## 2018-03-08 DIAGNOSIS — K219 Gastro-esophageal reflux disease without esophagitis: Secondary | ICD-10-CM | POA: Diagnosis not present

## 2018-03-08 DIAGNOSIS — Z955 Presence of coronary angioplasty implant and graft: Secondary | ICD-10-CM | POA: Insufficient documentation

## 2018-03-08 DIAGNOSIS — Z87442 Personal history of urinary calculi: Secondary | ICD-10-CM | POA: Insufficient documentation

## 2018-03-08 DIAGNOSIS — Z95810 Presence of automatic (implantable) cardiac defibrillator: Secondary | ICD-10-CM

## 2018-03-08 DIAGNOSIS — Z9889 Other specified postprocedural states: Secondary | ICD-10-CM | POA: Diagnosis not present

## 2018-03-08 DIAGNOSIS — E785 Hyperlipidemia, unspecified: Secondary | ICD-10-CM | POA: Insufficient documentation

## 2018-03-08 DIAGNOSIS — K589 Irritable bowel syndrome without diarrhea: Secondary | ICD-10-CM | POA: Diagnosis not present

## 2018-03-08 DIAGNOSIS — Z7982 Long term (current) use of aspirin: Secondary | ICD-10-CM | POA: Insufficient documentation

## 2018-03-08 DIAGNOSIS — I110 Hypertensive heart disease with heart failure: Secondary | ICD-10-CM | POA: Insufficient documentation

## 2018-03-08 DIAGNOSIS — Z006 Encounter for examination for normal comparison and control in clinical research program: Secondary | ICD-10-CM | POA: Diagnosis not present

## 2018-03-08 DIAGNOSIS — I519 Heart disease, unspecified: Secondary | ICD-10-CM | POA: Diagnosis present

## 2018-03-08 DIAGNOSIS — M8580 Other specified disorders of bone density and structure, unspecified site: Secondary | ICD-10-CM | POA: Insufficient documentation

## 2018-03-08 DIAGNOSIS — I700 Atherosclerosis of aorta: Secondary | ICD-10-CM | POA: Diagnosis not present

## 2018-03-08 DIAGNOSIS — Z90710 Acquired absence of both cervix and uterus: Secondary | ICD-10-CM | POA: Diagnosis not present

## 2018-03-08 DIAGNOSIS — I495 Sick sinus syndrome: Secondary | ICD-10-CM | POA: Insufficient documentation

## 2018-03-08 DIAGNOSIS — I428 Other cardiomyopathies: Secondary | ICD-10-CM | POA: Diagnosis not present

## 2018-03-08 DIAGNOSIS — Z981 Arthrodesis status: Secondary | ICD-10-CM | POA: Diagnosis not present

## 2018-03-08 DIAGNOSIS — I472 Ventricular tachycardia: Secondary | ICD-10-CM | POA: Insufficient documentation

## 2018-03-08 DIAGNOSIS — Z79899 Other long term (current) drug therapy: Secondary | ICD-10-CM | POA: Diagnosis not present

## 2018-03-08 DIAGNOSIS — Z9049 Acquired absence of other specified parts of digestive tract: Secondary | ICD-10-CM | POA: Insufficient documentation

## 2018-03-08 DIAGNOSIS — Z959 Presence of cardiac and vascular implant and graft, unspecified: Secondary | ICD-10-CM

## 2018-03-08 DIAGNOSIS — I509 Heart failure, unspecified: Secondary | ICD-10-CM | POA: Insufficient documentation

## 2018-03-08 HISTORY — PX: ICD IMPLANT: EP1208

## 2018-03-08 HISTORY — DX: Presence of automatic (implantable) cardiac defibrillator: Z95.810

## 2018-03-08 LAB — SURGICAL PCR SCREEN
MRSA, PCR: NEGATIVE
Staphylococcus aureus: NEGATIVE

## 2018-03-08 SURGERY — ICD IMPLANT

## 2018-03-08 MED ORDER — VALSARTAN-HYDROCHLOROTHIAZIDE 160-12.5 MG PO TABS: 1.0000 | ORAL_TABLET | Freq: Every day | ORAL | Status: DC

## 2018-03-08 MED ORDER — CEFAZOLIN SODIUM-DEXTROSE 2-4 GM/100ML-% IV SOLN
INTRAVENOUS | Status: AC
  Filled 2018-03-08: qty 100

## 2018-03-08 MED ORDER — LIDOCAINE HCL (PF) 1 % IJ SOLN
INTRAMUSCULAR | Status: AC
  Filled 2018-03-08: qty 30

## 2018-03-08 MED ORDER — ONDANSETRON HCL 4 MG/2ML IJ SOLN: 4.0000 mg | Freq: Four times a day (QID) | INTRAMUSCULAR | Status: DC | PRN

## 2018-03-08 MED ORDER — IOPAMIDOL (ISOVUE-370) INJECTION 76%
INTRAVENOUS | Status: AC
  Filled 2018-03-08: qty 50

## 2018-03-08 MED ORDER — MIDAZOLAM HCL 5 MG/5ML IJ SOLN
INTRAMUSCULAR | Status: AC
  Filled 2018-03-08: qty 5

## 2018-03-08 MED ORDER — IRBESARTAN 300 MG PO TABS
150.0000 mg | ORAL_TABLET | Freq: Every day | ORAL | Status: DC
  Administered 2018-03-09: 150 mg via ORAL
  Filled 2018-03-08: qty 1

## 2018-03-08 MED ORDER — CHLORHEXIDINE GLUCONATE 4 % EX LIQD
60.0000 mL | Freq: Once | CUTANEOUS | Status: DC
  Filled 2018-03-08: qty 60

## 2018-03-08 MED ORDER — MIDAZOLAM HCL 5 MG/5ML IJ SOLN
INTRAMUSCULAR | Status: DC | PRN
  Administered 2018-03-08 (×4): 1 mg via INTRAVENOUS

## 2018-03-08 MED ORDER — AMLODIPINE BESYLATE 2.5 MG PO TABS
2.5000 mg | ORAL_TABLET | Freq: Every evening | ORAL | Status: DC
  Administered 2018-03-08: 2.5 mg via ORAL
  Filled 2018-03-08: qty 1

## 2018-03-08 MED ORDER — SODIUM CHLORIDE 0.9 % IV SOLN: 250.0000 mL | INTRAVENOUS | Status: DC | PRN

## 2018-03-08 MED ORDER — IOPAMIDOL (ISOVUE-370) INJECTION 76%
INTRAVENOUS | Status: DC | PRN
  Administered 2018-03-08 (×2): 15 mL via INTRAVENOUS

## 2018-03-08 MED ORDER — SODIUM CHLORIDE 0.9 % IV SOLN
INTRAVENOUS | Status: AC
  Filled 2018-03-08: qty 2

## 2018-03-08 MED ORDER — CEFAZOLIN SODIUM-DEXTROSE 1-4 GM/50ML-% IV SOLN
1.0000 g | Freq: Four times a day (QID) | INTRAVENOUS | Status: AC
  Administered 2018-03-08 – 2018-03-09 (×3): 1 g via INTRAVENOUS
  Filled 2018-03-08 (×3): qty 50

## 2018-03-08 MED ORDER — MUPIROCIN 2 % EX OINT
TOPICAL_OINTMENT | CUTANEOUS | Status: AC
  Administered 2018-03-08: 1
  Filled 2018-03-08: qty 22

## 2018-03-08 MED ORDER — SODIUM CHLORIDE 0.9% FLUSH: 3.0000 mL | INTRAVENOUS | Status: DC | PRN

## 2018-03-08 MED ORDER — POTASSIUM CHLORIDE CRYS ER 20 MEQ PO TBCR
20.0000 meq | EXTENDED_RELEASE_TABLET | Freq: Every evening | ORAL | Status: DC
  Administered 2018-03-08: 20 meq via ORAL
  Filled 2018-03-08: qty 1

## 2018-03-08 MED ORDER — FENTANYL CITRATE (PF) 100 MCG/2ML IJ SOLN
INTRAMUSCULAR | Status: AC
  Filled 2018-03-08: qty 2

## 2018-03-08 MED ORDER — SODIUM CHLORIDE 0.9 % IV SOLN
80.0000 mg | INTRAVENOUS | Status: AC
  Administered 2018-03-08: 80 mg
  Filled 2018-03-08: qty 2

## 2018-03-08 MED ORDER — SODIUM CHLORIDE 0.9% FLUSH
3.0000 mL | Freq: Two times a day (BID) | INTRAVENOUS | Status: DC
  Administered 2018-03-08 – 2018-03-09 (×2): 3 mL via INTRAVENOUS

## 2018-03-08 MED ORDER — CEFAZOLIN SODIUM-DEXTROSE 2-4 GM/100ML-% IV SOLN
2.0000 g | INTRAVENOUS | Status: AC
  Administered 2018-03-08: 2 g via INTRAVENOUS
  Filled 2018-03-08: qty 100

## 2018-03-08 MED ORDER — LIDOCAINE HCL (PF) 1 % IJ SOLN
INTRAMUSCULAR | Status: DC | PRN
  Administered 2018-03-08: 60 mL
  Administered 2018-03-08: 75 mL

## 2018-03-08 MED ORDER — HEPARIN (PORCINE) IN NACL 1000-0.9 UT/500ML-% IV SOLN
INTRAVENOUS | Status: AC
  Filled 2018-03-08: qty 500

## 2018-03-08 MED ORDER — ACETAMINOPHEN 325 MG PO TABS
325.0000 mg | ORAL_TABLET | ORAL | Status: DC | PRN
  Administered 2018-03-08: 650 mg via ORAL
  Filled 2018-03-08: qty 2

## 2018-03-08 MED ORDER — SODIUM CHLORIDE 0.9 % IV SOLN
INTRAVENOUS | Status: DC | PRN
  Administered 2018-03-08: 80 mL

## 2018-03-08 MED ORDER — HYDROCHLOROTHIAZIDE 12.5 MG PO CAPS
12.5000 mg | ORAL_CAPSULE | Freq: Every day | ORAL | Status: DC
  Filled 2018-03-08: qty 1

## 2018-03-08 MED ORDER — SODIUM CHLORIDE 0.9 % IV SOLN
INTRAVENOUS | Status: DC
  Administered 2018-03-08: 13:00:00 via INTRAVENOUS

## 2018-03-08 MED ORDER — HYDROCODONE-ACETAMINOPHEN 5-325 MG PO TABS
1.0000 | ORAL_TABLET | ORAL | Status: DC | PRN
  Administered 2018-03-09: 2 via ORAL
  Administered 2018-03-09: 1 via ORAL
  Filled 2018-03-08: qty 2
  Filled 2018-03-08: qty 1

## 2018-03-08 MED ORDER — AMIODARONE HCL 200 MG PO TABS
200.0000 mg | ORAL_TABLET | Freq: Every day | ORAL | Status: DC
  Administered 2018-03-09: 200 mg via ORAL
  Filled 2018-03-08: qty 1

## 2018-03-08 MED ORDER — HEPARIN (PORCINE) IN NACL 1000-0.9 UT/500ML-% IV SOLN
INTRAVENOUS | Status: DC | PRN
  Administered 2018-03-08 (×2): 500 mL

## 2018-03-08 MED ORDER — LIDOCAINE HCL (PF) 1 % IJ SOLN
INTRAMUSCULAR | Status: AC
  Filled 2018-03-08: qty 60

## 2018-03-08 MED ORDER — TRAZODONE HCL 50 MG PO TABS
50.0000 mg | ORAL_TABLET | Freq: Every day | ORAL | Status: DC
  Administered 2018-03-08: 50 mg via ORAL
  Filled 2018-03-08: qty 1

## 2018-03-08 SURGICAL SUPPLY — 8 items
CABLE SURGICAL S-101-97-12 (CABLE) ×3 IMPLANT
ICD ASSURA DR CD2357-40Q (ICD Generator) ×3 IMPLANT
LEAD DURATA 7122Q-58CM (Lead) ×3 IMPLANT
LEAD TENDRIL MRI 46CM LPA1200M (Lead) ×3 IMPLANT
PAD PRO RADIOLUCENT 2001M-C (PAD) ×3 IMPLANT
SHEATH CLASSIC 7F (SHEATH) ×3 IMPLANT
SHEATH CLASSIC 8F (SHEATH) ×3 IMPLANT
TRAY PACEMAKER INSERTION (PACKS) ×3 IMPLANT

## 2018-03-08 NOTE — Interval H&P Note (Signed)
History and Physical Interval Note:  03/08/2018 1:35 PM  [name]  has presented today for surgery, with the diagnosis of cm - syncope  The various methods of treatment have been discussed with the patient and family. After consideration of risks, benefits and other options for treatment, the patient has consented to  Procedure(s): ICD IMPLANT (N/A) as a surgical intervention .  The patient's history has been reviewed, patient examined, no change in status, stable for surgery.  I have reviewed the patient's chart and labs.  Questions were answered to the patient's satisfaction.    ICD Criteria  Current LVEF:29%. Within 12 months prior to implant: Yes   Heart failure history: Yes, Class II  Cardiomyopathy history: Yes, Non-Ischemic Cardiomyopathy.  Atrial Fibrillation/Atrial Flutter: No.  Ventricular tachycardia history: No.  Cardiac arrest history: No.  History of syndromes with risk of sudden death: No.  Previous ICD: No.  Current ICD indication: Primary  PPM indication: Yes. Pacing type: Atrial. Less than 40% RV pacing requirement anticipated. Indication: Sick Sinus Syndrome  Class I or II Bradycardia indication present: Yes  Beta Blocker therapy for 3 or more months: No, medical reason. (symptomatic bradycardia)  Ace Inhibitor/ARB therapy for 3 or more months: Yes, prescribed.    I have seen Allison Mclean is a 82 y.o. femalepre-procedural and has been referred by Dr Saralyn Pilar for consideration of ICD implant for primary prevention of sudden death.  The patient's chart has been reviewed and they meet criteria for ICD implant.  I have had a thorough discussion with the patient reviewing options.  The patient and son have had opportunities to ask questions and have them answered. The patient and I have decided together through the [place] Support Tool to proceed with dual chamber ICD at this time.  Risks, benefits, alternatives to ICD implantation were  discussed in detail with the patient today. The patient  understands that the risks include but are not limited to bleeding, infection, pneumothorax, perforation, tamponade, vascular damage, renal failure, MI, stroke, death, inappropriate shocks, and lead dislodgement and wishes to proceed.    Thompson Grayer MD, [place] 03/08/2018 1:38 PM

## 2018-03-08 NOTE — Progress Notes (Signed)
Received post ICD placement.Rsurgical site dressing CDI, no hematoma or bruising noted.

## 2018-03-09 ENCOUNTER — Encounter (HOSPITAL_COMMUNITY): Payer: Self-pay | Admitting: Internal Medicine

## 2018-03-09 ENCOUNTER — Ambulatory Visit (HOSPITAL_COMMUNITY): Payer: Medicare Other

## 2018-03-09 DIAGNOSIS — Z006 Encounter for examination for normal comparison and control in clinical research program: Secondary | ICD-10-CM | POA: Diagnosis not present

## 2018-03-09 DIAGNOSIS — I472 Ventricular tachycardia: Secondary | ICD-10-CM | POA: Diagnosis not present

## 2018-03-09 DIAGNOSIS — I428 Other cardiomyopathies: Secondary | ICD-10-CM

## 2018-03-09 DIAGNOSIS — I495 Sick sinus syndrome: Secondary | ICD-10-CM | POA: Diagnosis not present

## 2018-03-09 LAB — BASIC METABOLIC PANEL
Anion gap: 11 (ref 5–15)
BUN: 14 mg/dL (ref 8–23)
CO2: 27 mmol/L (ref 22–32)
Calcium: 9.6 mg/dL (ref 8.9–10.3)
Chloride: 99 mmol/L (ref 98–111)
Creatinine, Ser: 1.02 mg/dL — ABNORMAL HIGH (ref 0.44–1.00)
GFR calc Af Amer: >60 mL/min (ref >60)
GFR calc non Af Amer: 52 mL/min — ABNORMAL LOW (ref >60)
Glucose, Bld: 96 mg/dL (ref 70–99)
Potassium: 4 mmol/L (ref 3.5–5.1)
Sodium: 137 mmol/L (ref 135–145)

## 2018-03-09 MED ORDER — AMIODARONE HCL 200 MG PO TABS: 100.0000 mg | ORAL_TABLET | Freq: Every day | ORAL | Status: DC

## 2018-03-09 NOTE — Discharge Summary (Addendum)
ELECTROPHYSIOLOGY PROCEDURE DISCHARGE SUMMARY    Patient ID: Allison Mclean,  MRN: [id number], DOB/AGE: 07/26/1935 82 y.o.  Admit date: 03/08/2018 Discharge date: 03/09/2018  Primary Care Physician: Idelle Crouch, MD Primary Cardiologist: Paras[place] Electrophysiologist: Dany Harten  Primary Discharge Diagnosis:  NICM s/p ICD implant this admission  Secondary Discharge Diagnosis:  1.  Sinus bradycardia 2.  HTN 3.  GERD 4.  Spinal stenosis  Allergies  Allergen Reactions  . Atorvastatin Other (See Comments)    Chest pain    . Rosuvastatin Other (See Comments)    Causes chest pain       Procedures This Admission:  1.  Implantation of a STJ dual chamber ICD on 03/08/18 by Dr Rayann Heman.  See op note for full details.  She was found to have a persistent L SVC and therefore right sided device was placed.  There were no immediate post procedure complications. 2.  CXR on 03/09/18 demonstrated no pneumothorax status post device implantation.   Brief HPI: Allison Mclean is a 82 y.o. female was referred to electrophysiology in the outpatient setting for consideration of ICD implantation.  Past medical history includes NICM, sinus bradycardia.  The patient has persistent LV dysfunction despite guideline directed therapy.  Risks, benefits, and alternatives to ICD implantation were reviewed with the patient who wished to proceed.   Hospital Course:  The patient was admitted and underwent implantation of a STJ dual chamber ICD with details as outlined above. She was monitored on telemetry overnight which demonstrated SR, intermittent A pacing.  Left and right chest was without hematoma or ecchymosis.  The device was interrogated and found to be functioning normally.  CXR was obtained and demonstrated no pneumothorax status post device implantation.  Wound care, arm mobility, and restrictions were reviewed with the patient.  The patient was examined and considered stable for discharge to home.     Physical Exam: Vitals:   03/08/18 1945 03/09/18 0034 03/09/18 0437 03/09/18 0756  BP: (!) 98/36 (!) 122/41 (!) 130/49 (!) 141/51  Pulse: (!) 59 60 (!) 59 63  Resp:  20 20 18   Temp: (!) 97.4 F (36.3 C) 97.8 F (36.6 C) (!) 97.5 F (36.4 C)   TempSrc: Oral Oral Oral   SpO2: 95% 94% 94% 95%  Weight:   76.1 kg   Height:        GEN- The patient is elderly appearing, alert and oriented x 3 today.   HEENT: normocephalic, atraumatic; sclera clear, conjunctiva pink; hearing intact; oropharynx clear; neck supple Lungs- Clear to ausculation bilaterally, normal work of breathing.  No wheezes, rales, rhonchi Heart- Regular rate and rhythm  GI- soft, non-tender, non-distended, bowel sounds present  Extremities- no clubbing, cyanosis, or edema  MS- no significant deformity or atrophy Skin- warm and dry, no rash or lesion, left and right chest without hematoma/ecchymosis Psych- euthymic mood, full affect Neuro- strength and sensation are intact   Labs:   Lab Results  Component Value Date   WBC 4.8 02/14/2018   HGB 12.8 02/14/2018   HCT 40.0 02/14/2018   MCV 85 02/14/2018   PLT 231 02/14/2018    Recent Labs  Lab 03/09/18 0340  NA 137  K 4.0  CL 99  CO2 27  BUN 14  CREATININE 1.02*  CALCIUM 9.6  GLUCOSE 96    Discharge Medications:  Allergies as of 03/09/2018      Reactions   Atorvastatin Other (See Comments)   Chest pain  Rosuvastatin Other (See Comments)   Causes chest pain        Medication List    TAKE these medications   amiodarone 200 MG tablet Commonly known as:  PACERONE Take 0.5 tablets (100 mg total) by mouth daily. What changed:  how much to take   amLODipine 2.5 MG tablet Commonly known as:  NORVASC Take 2.5 mg by mouth every evening.   aspirin EC 81 MG tablet Take 81 mg by mouth at bedtime.   calcium citrate-vitamin D 315-200 MG-UNIT tablet Commonly known as:  CITRACAL+D Take 1 tablet by mouth daily.   clobetasol ointment 0.05  % Commonly known as:  TEMOVATE Apply 1 application topically 2 (two) times daily as needed (sore areas). Avoid face and skin folds   fluticasone 50 MCG/ACT nasal spray Commonly known as:  FLONASE Place 2 sprays into both nostrils at bedtime as needed for allergies.   naproxen sodium 220 MG tablet Commonly known as:  ALEVE Take 220-440 mg by mouth 2 (two) times daily as needed (back pain.).   nystatin powder Generic drug:  nystatin Apply 1 g topically daily as needed (skin irritation).   OCUVITE EXTRA PO Take 1 tablet by mouth daily.   potassium chloride SA 20 MEQ tablet Commonly known as:  K-DUR,KLOR-CON Take 20 mEq by mouth every evening.   pravastatin 80 MG tablet Commonly known as:  PRAVACHOL Take 80 mg by mouth at bedtime.   sennosides-docusate sodium 8.6-50 MG tablet Commonly known as:  SENOKOT-S Take 1 tablet by mouth daily as needed for constipation.   traZODone 50 MG tablet Commonly known as:  DESYREL Take 50 mg by mouth at bedtime.   valsartan-hydrochlorothiazide 160-12.5 MG tablet Commonly known as:  DIOVAN-HCT Take 1 tablet by mouth daily.       Disposition:   Follow-up Information    [facility] Follow up on 03/21/2018.   Specialty:  Cardiology Why:  at 2:30PM  Contact information: [address], [place] [phone]       Thompson Grayer, MD Follow up on 06/17/2018.   Specialty:  Cardiology Why:  at [facility] information: [place] [address] [phone]           Duration of Discharge Encounter: Greater than 30 minutes including physician time.  Signed, Chanetta Marshall, NP 03/09/2018 9:01 AM   I have seen, examined the patient, and reviewed the above assessment and plan.  Changes to above are made where necessary.  On exam, RRR.  Device interrogation is personally reviewed and normal.  CXR reveals stable leads, no ptx.  Co Sign: Thompson Grayer, MD 03/09/2018 4:09 PM

## 2018-03-09 NOTE — Progress Notes (Signed)
AVS reviewed with patient and son. Pt able to articulate discharge instructions, even prior to review.  No questions expressed, pt denies complaints.   Call placed for wheelchair out.

## 2018-03-09 NOTE — Progress Notes (Signed)
Call placed to [facility] to notify of telemetry monitoring d/c.   

## 2018-03-09 NOTE — Progress Notes (Signed)
Orthopedic Tech Progress Note Patient Details:  Allison Mclean Apr 27, 1935 [id number]  Patient ID: Allison Mclean, female   DOB: 01-10-1936, 82 y.o.   MRN: [id number] Pt already has sling in room  Karolee Stamps 03/09/2018, 9:50 AM

## 2018-03-09 NOTE — Discharge Instructions (Signed)
° ° °  Supplemental Discharge Instructions for  °Pacemaker/Defibrillator Patients ° °Activity °No heavy lifting or vigorous activity with your left/right arm for 6 to 8 weeks.  Do not raise your left/right arm above your head for one week.  Gradually raise your affected arm as drawn below. ° °        ° °__         03/13/18                 03/14/18                  03/15/18                 03/16/18 ° °NO DRIVING for 1 week    ; you may begin driving on   03/16/18  . ° °WOUND CARE °- Keep the wound area clean and dry.  Do not get this area wet for one week. No showers for one week; you may shower on  03/16/18   . °- The tape/steri-strips on your wound will fall off; do not pull them off.  No bandage is needed on the site.  DO  NOT apply any creams, oils, or ointments to the wound area. °- If you notice any drainage or discharge from the wound, any swelling or bruising at the site, or you develop a fever > 101? F after you are discharged home, call the office at once. ° °Special Instructions °- You are still able to use cellular telephones; use the ear opposite the side where you have your pacemaker/defibrillator.  Avoid carrying your cellular phone near your device. °- When traveling through airports, show security personnel your identification card to avoid being screened in the metal detectors.  Ask the security personnel to use the hand wand. °- Avoid arc welding equipment, MRI testing (magnetic resonance imaging), TENS units (transcutaneous nerve stimulators).  Call the office for questions about other devices. °- Avoid electrical appliances that are in poor condition or are not properly grounded. °- Microwave ovens are safe to be near or to operate. °  °

## 2018-03-13 ENCOUNTER — Telehealth: Payer: Self-pay | Admitting: Cardiology

## 2018-03-13 NOTE — Telephone Encounter (Signed)
Pt's son called. Pt has a rash on her chest and some "oozing" from her pacer sites. She does not think she has a fever. She is not on any new medications. I suggested she may want to go to the ED to have this evaluated but they declined. I offered to have her seen in the pacer clinic tomorrow and they agreed to that. She has been taking Benadryl PRN. I discussed the above plan with Dr Rayann Heman and he is in agreement.   Kerin Ransom PA-C 03/13/2018 4:13 PM

## 2018-03-14 ENCOUNTER — Telehealth: Payer: Self-pay | Admitting: Internal Medicine

## 2018-03-14 ENCOUNTER — Ambulatory Visit (INDEPENDENT_AMBULATORY_CARE_PROVIDER_SITE_OTHER): Payer: Medicare Other | Admitting: *Deleted

## 2018-03-14 VITALS — Temp 97.1°F

## 2018-03-14 DIAGNOSIS — Z95810 Presence of automatic (implantable) cardiac defibrillator: Secondary | ICD-10-CM

## 2018-03-14 DIAGNOSIS — R21 Rash and other nonspecific skin eruption: Secondary | ICD-10-CM

## 2018-03-14 NOTE — Telephone Encounter (Signed)
Called patient regarding the drainage from her incisions. Patient states that Dr.Allred had attempted to implant her device on her L side, but d/t a persistent SVC the device was implanted on her R side. Patient states that she noticed yellow drainage from both sites on Saturday and Sunday. Patient states that she also has some swelling at the ICD pocket (R side). Patient denies any fever/chils.   Patient also states that she developed a rash from her chin to her chest. Patient states that she believes she may have had a reaction to the surgical scrub. I informed patient that she could use an anti-itch ointment around her steri strips, and I also reminded her not to drive if she takes PRN benadryl for the itching. Patient verbalized understanding.  Appt scheduled for 12/9 @1230  w/ Device clinic.

## 2018-03-14 NOTE — Patient Instructions (Addendum)
Keep taking Benadryl as instructed.  Pick up some hydrocortisone (1%) anti-itch cream at your pharmacy. Apply this as instructed on the package to your rash (avoiding the bandaged area).  Keep your wound check on 03/21/18 at 2:30pm. Call the [facility] at [phone] if you develop worsening drainage or swelling. Go to the ER if you develop fever or chills.

## 2018-03-14 NOTE — Progress Notes (Signed)
Add-on wound check in clinic as patient reports that she has noted pruritic rash near bilateral chest incision sites extending up to her neck. She also reports yellowish drainage from bilateral incisions on Saturday and Sunday. She denies a previous reaction to CHG soap.  Patient assessed by Dr. Rayann Heman. Afebrile today, denies fever/chills at home. Drainage has improved since yesterday, minimal drainage this morning per patient. Steri-strips dry upon assessment. Ecchymosis in various stages of healing noted at R axilla, L axilla, and L breast. Minimal swelling noted at right chest (implant site), no swelling noted at left chest. Rash is pictured below.  Patient reports she has been taking Benadryl every 4-6 hours and this is helping to manage the itching. Per Dr. Rayann Heman, Steri-strips to remain in place until wound check. Dr. Rayann Heman instructed patient to apply OTC hydrocortisone cream to rash PRN as instructed on label, taking care to avoid areas covered by Steri-strips. Offered to move wound check appointment up to 12/12 but patient declines due to lack of transportation. She agrees to keep wound check on 03/21/18 and is aware to call our office or seek emergency medical attention for signs of infection, including worsening drainage, fever, or chills.  Patient gave verbal permission to include the following photo:

## 2018-03-14 NOTE — Telephone Encounter (Signed)
°  1. Has your device fired? NO  2. Is you device beeping? NO  3. Are you experiencing draining or swelling at device site? Yes, draining for her wound site.  Patient states she had the device inserted last week on Tuesday, 12/3   4. Are you calling to see if we received your device transmission? no  5. Have you passed out? no    Please route to [place]

## 2018-03-21 ENCOUNTER — Ambulatory Visit (INDEPENDENT_AMBULATORY_CARE_PROVIDER_SITE_OTHER): Payer: Medicare Other | Admitting: *Deleted

## 2018-03-21 DIAGNOSIS — I519 Heart disease, unspecified: Secondary | ICD-10-CM | POA: Diagnosis not present

## 2018-03-23 ENCOUNTER — Ambulatory Visit: Payer: Medicare Other

## 2018-03-29 LAB — CUP PACEART INCLINIC DEVICE CHECK
Brady Statistic RA Percent Paced: 62 %
Brady Statistic RV Percent Paced: 1 % — CL
Date Time Interrogation Session: 20191224111501
HighPow Impedance: 61 Ohm
Implantable Lead Implant Date: 20191203
Implantable Lead Location: 753859
Implantable Lead Location: 753860
Implantable Pulse Generator Implant Date: 20191203
Lead Channel Impedance Value: 480 Ohm
Lead Channel Impedance Value: 550 Ohm
Lead Channel Pacing Threshold Amplitude: 0.75 V
Lead Channel Pacing Threshold Amplitude: 0.75 V
Lead Channel Pacing Threshold Pulse Width: 0.5 ms
Lead Channel Pacing Threshold Pulse Width: 0.5 ms
Lead Channel Sensing Intrinsic Amplitude: 1.3 mV
Lead Channel Sensing Intrinsic Amplitude: 6.5 mV
Lead Channel Setting Pacing Amplitude: 3.5 V
Lead Channel Setting Pacing Amplitude: 3.5 V
Lead Channel Setting Pacing Pulse Width: 0.5 ms
MDC IDC LEAD IMPLANT DT: 20191203
Pulse Gen Serial Number: 9854527

## 2018-03-29 NOTE — Progress Notes (Signed)

## 2018-06-06 ENCOUNTER — Telehealth: Payer: Self-pay

## 2018-06-06 NOTE — Telephone Encounter (Signed)
Pt states she was shocked by her ICD 06/03/2018 at 2:30 am. Pt did not have any symptoms. The device nurse did look at the pt transmission and she did not see any abnormalities. The pt states that at the time it woke her up out of her sleep. She had pain in her arm and across her back. I told her if she have any further questions the device nurse could call her back. She states she could wait to see Dr. Rayann Heman next week.

## 2018-06-06 NOTE — Telephone Encounter (Signed)
Confirmed with device nurse-Pt DID NOT receive shock from her device.  Pt has f/u with Dr. Rayann Heman next week.  Pt previously stated she is content with upcoming appt time.  Pt has had recent catheterization with minimal CAD.  No action needed.

## 2018-06-13 ENCOUNTER — Encounter: Payer: Self-pay | Admitting: Internal Medicine

## 2018-06-13 ENCOUNTER — Ambulatory Visit (INDEPENDENT_AMBULATORY_CARE_PROVIDER_SITE_OTHER): Payer: Medicare Other | Admitting: Internal Medicine

## 2018-06-13 VITALS — BP 112/60 | HR 66 | Ht 60.0 in | Wt 175.0 lb

## 2018-06-13 DIAGNOSIS — Z95810 Presence of automatic (implantable) cardiac defibrillator: Secondary | ICD-10-CM

## 2018-06-13 DIAGNOSIS — I4729 Other ventricular tachycardia: Secondary | ICD-10-CM

## 2018-06-13 DIAGNOSIS — I509 Heart failure, unspecified: Secondary | ICD-10-CM | POA: Diagnosis not present

## 2018-06-13 DIAGNOSIS — I472 Ventricular tachycardia: Secondary | ICD-10-CM | POA: Diagnosis not present

## 2018-06-13 DIAGNOSIS — I519 Heart disease, unspecified: Secondary | ICD-10-CM | POA: Diagnosis not present

## 2018-06-13 MED ORDER — AMIODARONE HCL 100 MG PO TABS: 50.0000 mg | ORAL_TABLET | Freq: Every day | ORAL | 3 refills | Status: DC

## 2018-06-13 MED ORDER — AMIODARONE HCL 100 MG PO TABS: 100.0000 mg | ORAL_TABLET | Freq: Every day | ORAL | 3 refills | Status: DC

## 2018-06-13 NOTE — Patient Instructions (Addendum)
Medication Instructions:  Your physician has recommended you make the following change in your medication:   1. Decrease your Amiodarone to 100 mg, half tablet, once daily.  Labwork: None ordered.  Testing/Procedures: None ordered.  Follow-Up: Your physician recommends that you schedule a follow-up appointment in:   12 months with Dr Rayann Heman  Any Other Special Instructions Will Be Listed Below (If Applicable).   You will be contacted by Sharman Cheek, RN to discuss enrollment in our fluid management clinic.  If you need a refill on your cardiac medications before your next appointment, please call your pharmacy.

## 2018-06-13 NOTE — Progress Notes (Signed)
PCP: Idelle Crouch, MD Primary Cardiologist: Dr Saralyn Pilar Primary EP: Dr Rayann Heman  Allison Mclean is a 83 y.o. female who presents today for routine electrophysiology followup.  Since her recent ICD implant, the patient reports doing very well.  Today, she denies symptoms of palpitations, chest pain, shortness of breath,  lower extremity edema, dizziness, presyncope, syncope, or ICD shocks.  The patient is otherwise without complaint today.   Past Medical History:  Diagnosis Date  . Abnormal MRI, thoracic spine 07/02/2015   IMPRESSION: 1. 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 Subsequent moderate to severe adjacent segment facet arthropathy. 2. Widespread thoracic spine disc bulging. Occasional small superimposed disc protrusions (T8-9, T11-12). 3. Subsequent multifactorial mild thoracic spinal stenosis T7-8, T8-9, T9-10, T10-11, T11-12, and T12-L1. Up to mild spi  . Acid reflux 11/22/2014  . 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 Allergy   . Chronic thoracic radicular pain (T5/T6) (Left) 07/02/2015  . Essential (primary) hypertension 12/23/2013  . GERD (gastroesophageal reflux disease)   . Heart murmur   . History of fusion of cervical spine 07/02/2015  . History of hiatal hernia    s/p  . History of kidney stones   . History of lumbar fusion (12/19/2013) (posterior lumbar fusion from L2-L5) 07/02/2015   Pedicle screws and posterior hardware at L5 L4-L3 and L2. Neil disc block at the L4-5 disc space. Slight anterior subluxation of L4 on L5. Different configuration and this had L3-4. Degenerative changes of the lumbar disc spaces. L5-S1 disc space well-maintained .   Marland Kitchen HLD (hyperlipidemia) 11/07/2013  . Hyperlipidemia   . Hypertension   . Infection    abcess in left upper tooth   . Jackhammer esophagus   . Nerve root pain 07/02/2015   Thoracic pain is likely to be secondary to severe left T5 foraminal stenosis.   . Neuropathic pain 08/05/2015  .  Opiate use (10 MME/day) 07/02/2015  . Osteopenia, senile 07/02/2015  . Supraventricular tachycardia ([place]) 11/07/2013  . Thoracic central spinal stenosis (T7-8, T8-9, T9-10, T10-11, T11-12, and T12-L1) 07/02/2015  . Thoracic facet syndrome (Left) 07/02/2015  . Thoracic foraminal stenosis (Moderate to severe at: Right T5; Bilateral T9; Left T11) 07/02/2015  . Thoracic spondylosis with radiculopathy (Left) 07/09/2015  . Wears dentures    partial lower   Past Surgical History:  Procedure Laterality Date  . ABDOMINAL EXPOSURE N/A 06/02/2016   Procedure: ABDOMINAL EXPOSURE;  Surgeon: Angelia Mould, MD;  Location: [place];  Service: Vascular;  Laterality: N/A;  . ABDOMINAL HYSTERECTOMY    . ANTERIOR LAT LUMBAR FUSION N/A 06/02/2016   Procedure: L1-2 Left Anterior lateral lumbar interbody fusion;  Surgeon: Erline Levine, MD;  Location: [place];  Service: Neurosurgery;  Laterality: N/A;  L1-2 Anterior lateral lumbar interbody fusion  . ANTERIOR LUMBAR FUSION N/A 06/02/2016   Procedure: Lumbar five -sacral one Anterior lumbar interbody fusion with Dr. Deitra Mayo; Lumbar one-two Left Anterior lateral lumbar interbody fusion;  Surgeon: Erline Levine, MD;  Location: [place];  Service: Neurosurgery;  Laterality: N/A;  . APPENDECTOMY    . APPLICATION OF INTRAOPERATIVE CT SCAN N/A 06/05/2016   Procedure: APPLICATION OF INTRAOPERATIVE CT SCAN;  Surgeon: Erline Levine, MD;  Location: [place];  Service: Neurosurgery;  Laterality: N/A;  . BACK SURGERY     2005, 2006, 2015  . CARDIAC CATHETERIZATION N/A 02/07/2015   Procedure: Left Heart Cath and Coronary Angiography;  Surgeon: Teodoro Spray, MD;  Location: [facility]  INVASIVE CV LAB;  Service: Cardiovascular;  Laterality: N/A;  . CHOLECYSTECTOMY    . ESOPHAGOGASTRODUODENOSCOPY (EGD) WITH PROPOFOL N/A 02/03/2016   Procedure: ESOPHAGOGASTRODUODENOSCOPY (EGD) WITH PROPOFOL;  Surgeon: Lucilla Lame, MD;  Location: [place];  Service: Endoscopy;  Laterality: N/A;  .  ESOPHAGOGASTRODUODENOSCOPY (EGD) WITH PROPOFOL N/A 03/02/2016   Procedure: ESOPHAGOGASTRODUODENOSCOPY (EGD) WITH PROPOFOL;  Surgeon: Lucilla Lame, MD;  Location: [place];  Service: Endoscopy;  Laterality: N/A;  . ICD IMPLANT N/A 03/08/2018   Procedure: ICD IMPLANT;  Surgeon: Thompson Grayer, MD;  Location: [place] CV LAB;  Service: Cardiovascular;  Laterality: N/A;  . LEFT HEART CATH AND CORONARY ANGIOGRAPHY Left 12/21/2017   Procedure: LEFT HEART CATH AND CORONARY ANGIOGRAPHY;  Surgeon: Isaias Cowman, MD;  Location: [place] CV LAB;  Service: Cardiovascular;  Laterality: Left;  . NISSEN FUNDOPLICATION    . POSTERIOR LUMBAR FUSION 4 LEVEL N/A 06/05/2016   Procedure: Thoracic six to pelvis fixation with Smith-Peterson Thoracic osteotomies at Thoracic ten-Thoracic Twelve with AIRO, Removal hardware lumbar two to lumbar five;  Surgeon: Erline Levine, MD;  Location: [place];  Service: Neurosurgery;  Laterality: N/A;  . SPINAL GROWTH RODS Bilateral 2018  . SYMPATHECTOMY     ? improve blood flow to legs  . TONSILECTOMY, ADENOIDECTOMY, BILATERAL MYRINGOTOMY AND TUBES      ROS- all systems are reviewed and negative except as per HPI above  Current Outpatient Medications  Medication Sig Dispense Refill  . amiodarone (PACERONE) 200 MG tablet Take 0.5 tablets (100 mg total) by mouth daily.    Marland Kitchen amLODipine (NORVASC) 2.5 MG tablet Take 2.5 mg by mouth every evening.     Marland Kitchen aspirin EC 81 MG tablet Take 81 mg by mouth at bedtime.     . calcium citrate-vitamin D (CITRACAL+D) 315-200 MG-UNIT tablet Take 1 tablet by mouth daily.     . clobetasol ointment (TEMOVATE) 7.82 % Apply 1 application topically 2 (two) times daily as needed (sore areas). Avoid face and skin folds  1  . fluticasone (FLONASE) 50 MCG/ACT nasal spray Place 2 sprays into both nostrils at bedtime as needed for allergies.     . hydrochlorothiazide (HYDRODIURIL) 25 MG tablet Take 1 tablet by mouth daily.    . Multiple  Vitamins-Minerals (OCUVITE EXTRA PO) Take 1 tablet by mouth daily.     . naproxen sodium (ALEVE) 220 MG tablet Take 220-440 mg by mouth 2 (two) times daily as needed (back pain.).    Marland Kitchen nystatin (NYSTATIN) powder Apply 1 g topically daily as needed (skin irritation).     . potassium chloride SA (K-DUR,KLOR-CON) 20 MEQ tablet Take 20 mEq by mouth every evening.     . pravastatin (PRAVACHOL) 80 MG tablet Take 80 mg by mouth at bedtime.  3  . sennosides-docusate sodium (SENOKOT-S) 8.6-50 MG tablet Take 1 tablet by mouth daily as needed for constipation.    . traZODone (DESYREL) 50 MG tablet Take 50 mg by mouth at bedtime.    . valsartan-hydrochlorothiazide (DIOVAN-HCT) 160-12.5 MG per tablet Take 1 tablet by mouth daily.  3   No current facility-administered medications for this visit.     Physical Exam: Vitals:   06/13/18 1532  BP: 112/60  Pulse: 66  SpO2: 97%  Weight: 175 lb (79.4 kg)  Height: 5' (1.524 m)    GEN- The patient is well appearing, alert and oriented x 3 today.   Head- normocephalic, atraumatic Eyes-  Sclera clear, conjunctiva pink Ears- hearing intact Oropharynx- clear Lungs-  Clear to ausculation bilaterally, normal work of breathing Chest- ICD pocket is well healed Heart- Regular rate and rhythm, no murmurs, rubs or gallops, PMI not laterally displaced GI- soft, NT, ND, + BS Extremities- no clubbing, cyanosis, or edema  ICD interrogation- reviewed in detail today,  See PACEART report  ekg tracing ordered today is personally reviewed and shows atrial paced, PR 166 msec, QRS 110 msec, Qtc 467 msec, nonspecific ST/t changes  Wt Readings from Last 3 Encounters:  06/13/18 175 lb (79.4 kg)  03/09/18 167 lb 12.8 oz (76.1 kg)  02/14/18 167 lb 9.6 oz (76 kg)    Assessment and Plan:  1.  Chronic systolic dysfunction/ nonischemic CM with prior syncope euvolemic today Stable on an appropriate medical regimen Normal ICD function See Pace Art report No changes  today she is not device dependant today enroll in ICM device clinic    2. NSVT Started previously on amiodarone by Dr Marcello Moores I have advised that she stop it.  She is reluctant to do so. I have encouraged her to reduce amiodarone to 100mg  daily today She can discuss stopping amioda+rone further with Dr Meda Coffee Return to see EP NP in a year  Thompson Grayer MD, [facility] 06/13/2018 3:42 PM

## 2018-06-14 LAB — CUP PACEART INCLINIC DEVICE CHECK
Battery Remaining Longevity: 94 mo
Brady Statistic RA Percent Paced: 77 %
Brady Statistic RV Percent Paced: 0.92 %
Date Time Interrogation Session: 20200309193007
HighPow Impedance: 74.25 Ohm
Implantable Lead Implant Date: 20191203
Implantable Lead Implant Date: 20191203
Implantable Lead Location: 753859
Implantable Lead Location: 753860
Implantable Pulse Generator Implant Date: 20191203
Lead Channel Impedance Value: 487.5 Ohm
Lead Channel Impedance Value: 600 Ohm
Lead Channel Pacing Threshold Amplitude: 0.5 V
Lead Channel Pacing Threshold Amplitude: 0.5 V
Lead Channel Pacing Threshold Pulse Width: 0.5 ms
Lead Channel Pacing Threshold Pulse Width: 0.5 ms
Lead Channel Sensing Intrinsic Amplitude: 1 mV
Lead Channel Sensing Intrinsic Amplitude: 5.5 mV
Lead Channel Setting Pacing Amplitude: 0.75 V
Lead Channel Setting Pacing Amplitude: 2 V
Lead Channel Setting Pacing Pulse Width: 0.5 ms
MDC IDC MSMT LEADCHNL RA PACING THRESHOLD AMPLITUDE: 0.5 V
MDC IDC MSMT LEADCHNL RA PACING THRESHOLD PULSEWIDTH: 0.5 ms
MDC IDC PG SERIAL: 9854527
MDC IDC SET LEADCHNL RV SENSING SENSITIVITY: 0.5 mV

## 2018-06-15 NOTE — Addendum Note (Signed)
Addended by: Rose Phi on: 06/15/2018 02:24 PM   Modules accepted: Orders

## 2018-06-17 ENCOUNTER — Encounter: Payer: Medicare Other | Admitting: Internal Medicine

## 2018-07-25 ENCOUNTER — Telehealth: Payer: Self-pay

## 2018-07-25 NOTE — Telephone Encounter (Signed)
Referred to ICM clinic by Dr Rayann Heman.   Attempted call to patient for ICM intro and person answering phone stated she was not available.

## 2018-08-16 NOTE — Telephone Encounter (Signed)
Attempted ICM intro call and husband said she was not home. She had gone to pick up strawberries and will be back this afternoon.  Will schedule [facility] ICM remote for 09/13/2018 if patient agreeable to monthly follow up.

## 2018-08-22 NOTE — Telephone Encounter (Signed)
Attempted ICM referral call for intro and no answer.  Left message to return call.

## 2018-09-02 NOTE — Telephone Encounter (Signed)
Unable to reach patient for ICM enrollment.  Device clinic will continue to follow every 91 days per protocol.

## 2018-09-12 ENCOUNTER — Ambulatory Visit (INDEPENDENT_AMBULATORY_CARE_PROVIDER_SITE_OTHER): Payer: Medicare Other | Admitting: *Deleted

## 2018-09-12 DIAGNOSIS — R55 Syncope and collapse: Secondary | ICD-10-CM

## 2018-09-12 DIAGNOSIS — I428 Other cardiomyopathies: Secondary | ICD-10-CM | POA: Diagnosis not present

## 2018-09-12 LAB — CUP PACEART REMOTE DEVICE CHECK
Battery Remaining Longevity: 96 mo
Battery Remaining Percentage: 92 %
Battery Voltage: 3.17 V
Brady Statistic AP VP Percent: 1 %
Brady Statistic AP VS Percent: 45 %
Brady Statistic AS VP Percent: 1 %
Brady Statistic AS VS Percent: 53 %
Brady Statistic RA Percent Paced: 45 %
Brady Statistic RV Percent Paced: 1 %
Date Time Interrogation Session: 20200608060020
HighPow Impedance: 74 Ohm
HighPow Impedance: 74 Ohm
Implantable Lead Implant Date: 20191203
Implantable Lead Implant Date: 20191203
Implantable Lead Location: 753859
Implantable Lead Location: 753860
Implantable Pulse Generator Implant Date: 20191203
Lead Channel Impedance Value: 530 Ohm
Lead Channel Impedance Value: 560 Ohm
Lead Channel Pacing Threshold Amplitude: 0.5 V
Lead Channel Pacing Threshold Amplitude: 0.625 V
Lead Channel Pacing Threshold Pulse Width: 0.5 ms
Lead Channel Pacing Threshold Pulse Width: 0.5 ms
Lead Channel Sensing Intrinsic Amplitude: 1.6 mV
Lead Channel Sensing Intrinsic Amplitude: 4.7 mV
Lead Channel Setting Pacing Amplitude: 0.875
Lead Channel Setting Pacing Amplitude: 2 V
Lead Channel Setting Pacing Pulse Width: 0.5 ms
Lead Channel Setting Sensing Sensitivity: 0.5 mV
Pulse Gen Serial Number: 9854527

## 2018-09-19 NOTE — Progress Notes (Signed)
Remote ICD transmission.   

## 2018-12-13 ENCOUNTER — Ambulatory Visit (INDEPENDENT_AMBULATORY_CARE_PROVIDER_SITE_OTHER): Payer: Medicare Other | Admitting: *Deleted

## 2018-12-13 DIAGNOSIS — I428 Other cardiomyopathies: Secondary | ICD-10-CM

## 2018-12-15 LAB — CUP PACEART REMOTE DEVICE CHECK
Battery Remaining Longevity: 94 mo
Battery Remaining Percentage: 90 %
Battery Voltage: 3.13 V
Brady Statistic AP VP Percent: 1 %
Brady Statistic AP VS Percent: 43 %
Brady Statistic AS VP Percent: 1 %
Brady Statistic AS VS Percent: 55 %
Brady Statistic RA Percent Paced: 43 %
Brady Statistic RV Percent Paced: 1 %
Date Time Interrogation Session: 20200909160552
HighPow Impedance: 84 Ohm
HighPow Impedance: 84 Ohm
Implantable Lead Implant Date: 20191203
Implantable Lead Implant Date: 20191203
Implantable Lead Location: 753859
Implantable Lead Location: 753860
Implantable Pulse Generator Implant Date: 20191203
Lead Channel Impedance Value: 540 Ohm
Lead Channel Impedance Value: 610 Ohm
Lead Channel Pacing Threshold Amplitude: 0.5 V
Lead Channel Pacing Threshold Pulse Width: 0.5 ms
Lead Channel Sensing Intrinsic Amplitude: 2.4 mV
Lead Channel Sensing Intrinsic Amplitude: 6.8 mV
Lead Channel Setting Pacing Amplitude: 0.75 V
Lead Channel Setting Pacing Amplitude: 2 V
Lead Channel Setting Pacing Pulse Width: 0.5 ms
Lead Channel Setting Sensing Sensitivity: 0.5 mV
Pulse Gen Serial Number: 9854527

## 2018-12-28 ENCOUNTER — Encounter: Payer: Self-pay | Admitting: Cardiology

## 2018-12-28 NOTE — Progress Notes (Signed)
Remote ICD transmission.   

## 2019-02-10 ENCOUNTER — Other Ambulatory Visit: Payer: Self-pay | Admitting: Internal Medicine

## 2019-02-10 DIAGNOSIS — Z1231 Encounter for screening mammogram for malignant neoplasm of breast: Secondary | ICD-10-CM

## 2019-03-13 LAB — CUP PACEART REMOTE DEVICE CHECK
Battery Remaining Longevity: 91 mo
Battery Remaining Percentage: 88 %
Battery Voltage: 3.1 V
Brady Statistic AP VP Percent: 1 %
Brady Statistic AP VS Percent: 40 %
Brady Statistic AS VP Percent: 1 %
Brady Statistic AS VS Percent: 58 %
Brady Statistic RA Percent Paced: 39 %
Brady Statistic RV Percent Paced: 1 %
Date Time Interrogation Session: 20201207020017
HighPow Impedance: 86 Ohm
HighPow Impedance: 86 Ohm
Implantable Lead Implant Date: 20191203
Implantable Lead Implant Date: 20191203
Implantable Lead Location: 753859
Implantable Lead Location: 753860
Implantable Pulse Generator Implant Date: 20191203
Lead Channel Impedance Value: 490 Ohm
Lead Channel Impedance Value: 660 Ohm
Lead Channel Pacing Threshold Amplitude: 0.5 V
Lead Channel Pacing Threshold Amplitude: 0.5 V
Lead Channel Pacing Threshold Pulse Width: 0.5 ms
Lead Channel Pacing Threshold Pulse Width: 0.5 ms
Lead Channel Sensing Intrinsic Amplitude: 1.6 mV
Lead Channel Sensing Intrinsic Amplitude: 6.1 mV
Lead Channel Setting Pacing Amplitude: 0.75 V
Lead Channel Setting Pacing Amplitude: 2 V
Lead Channel Setting Pacing Pulse Width: 0.5 ms
Lead Channel Setting Sensing Sensitivity: 0.5 mV
Pulse Gen Serial Number: 9854527

## 2019-03-14 ENCOUNTER — Ambulatory Visit (INDEPENDENT_AMBULATORY_CARE_PROVIDER_SITE_OTHER): Payer: Medicare Other | Admitting: *Deleted

## 2019-03-14 DIAGNOSIS — I519 Heart disease, unspecified: Secondary | ICD-10-CM

## 2019-04-12 IMAGING — US US CAROTID DUPLEX BILAT
1 series · 13 of 24 positions shown · non-contrast
Comparison: 05/20/2017

CLINICAL DATA: Carotid atherosclerosis

EXAM:
BILATERAL CAROTID DUPLEX ULTRASOUND
TECHNIQUE: Gray scale imaging, color Doppler and duplex ultrasound were
performed of bilateral carotid and vertebral arteries in the neck.

[Series 1: us carotid duplex bilat · 13 of 70 slices shown]
[im 1/70]
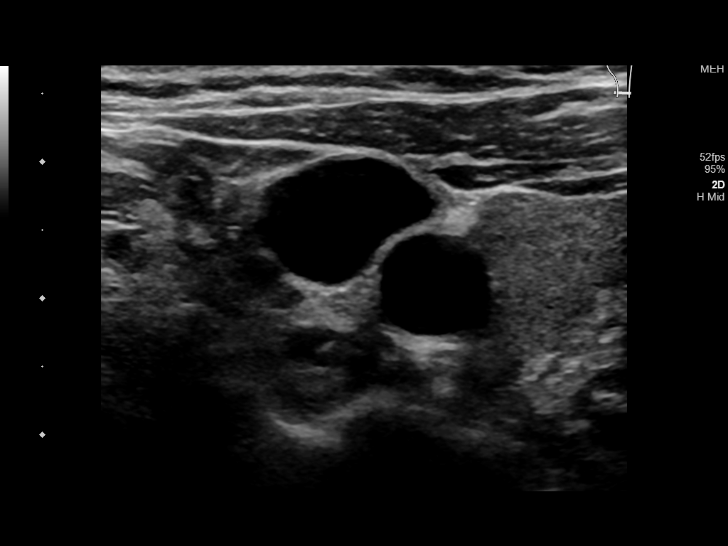
[im 7/70]
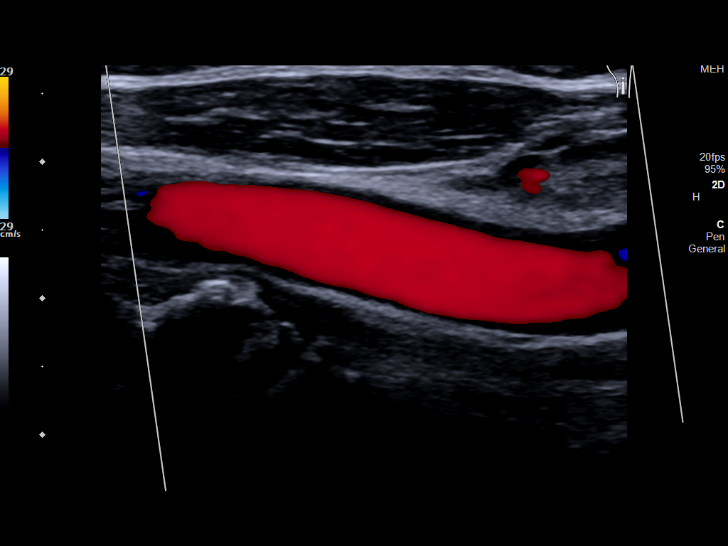
[im 13/70]
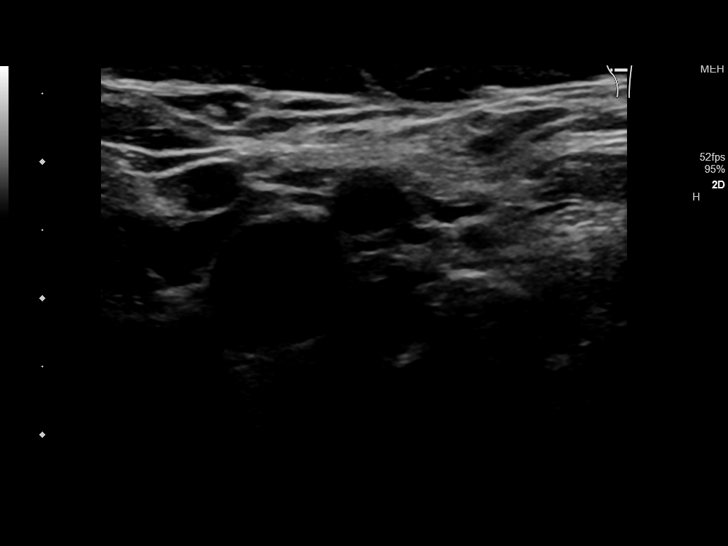
[im 19/70]
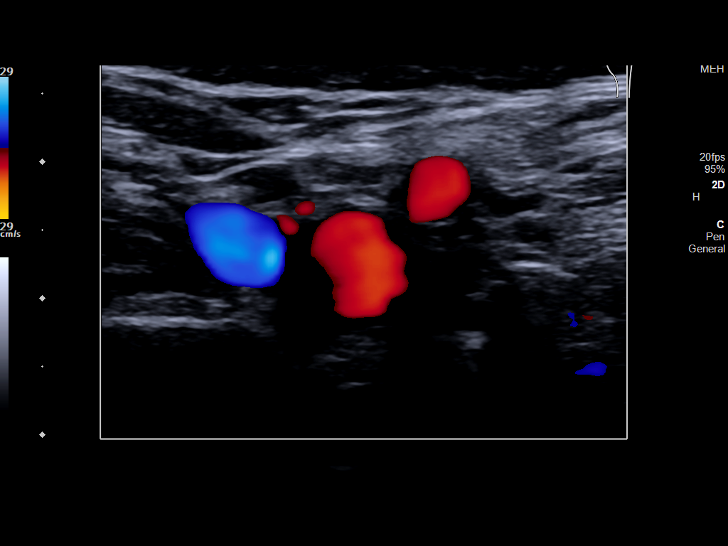
[im 25/70]
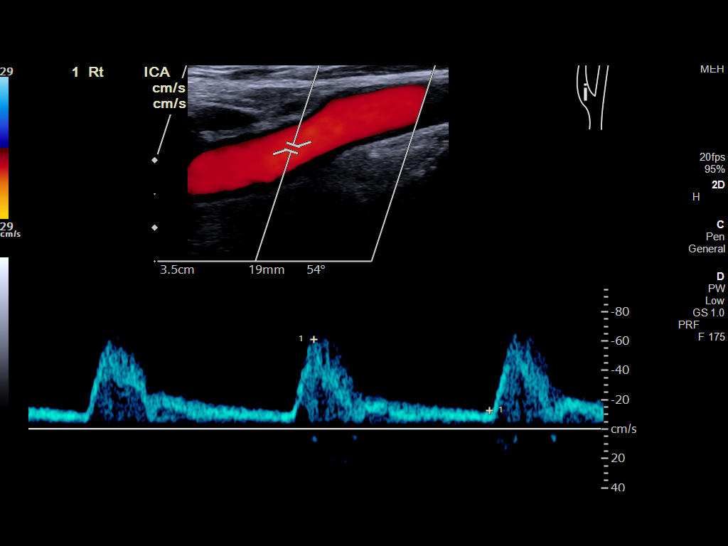
[im 31/70]
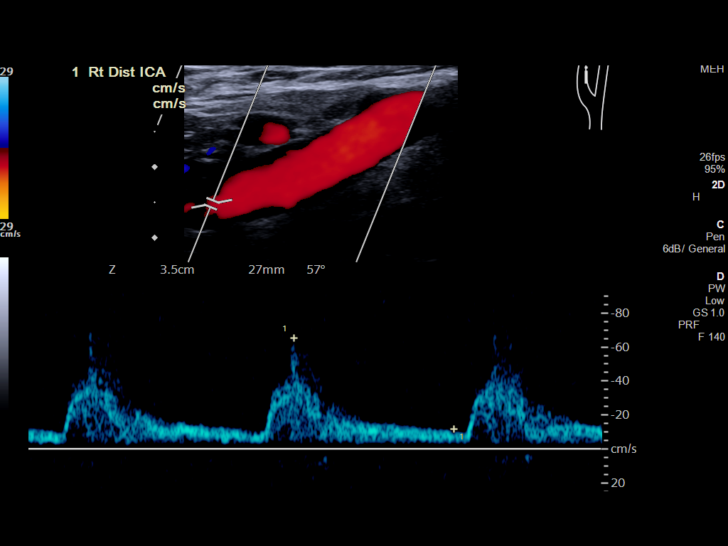
[im 37/70]
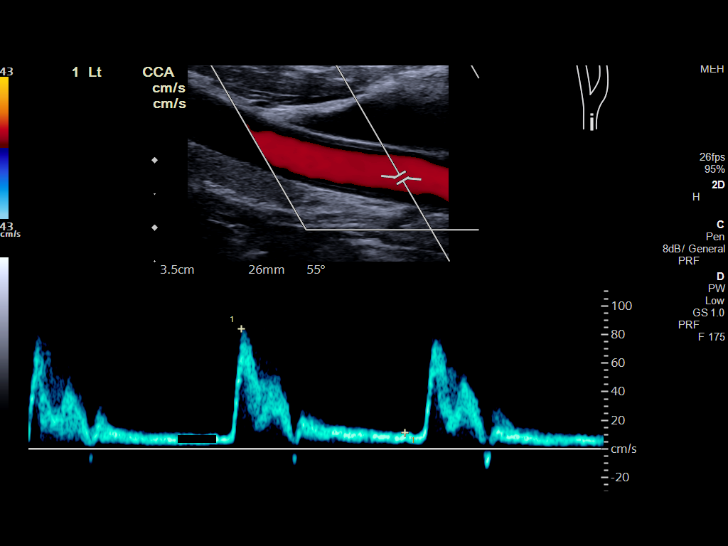
[im 40/70]
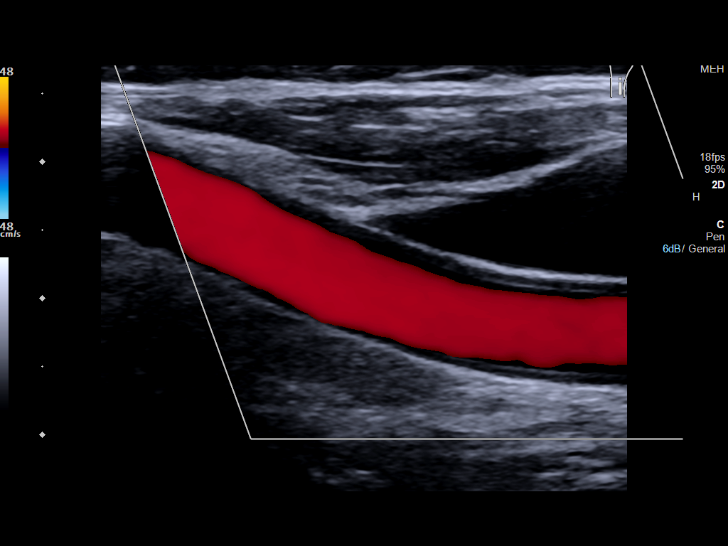
[im 46/70]
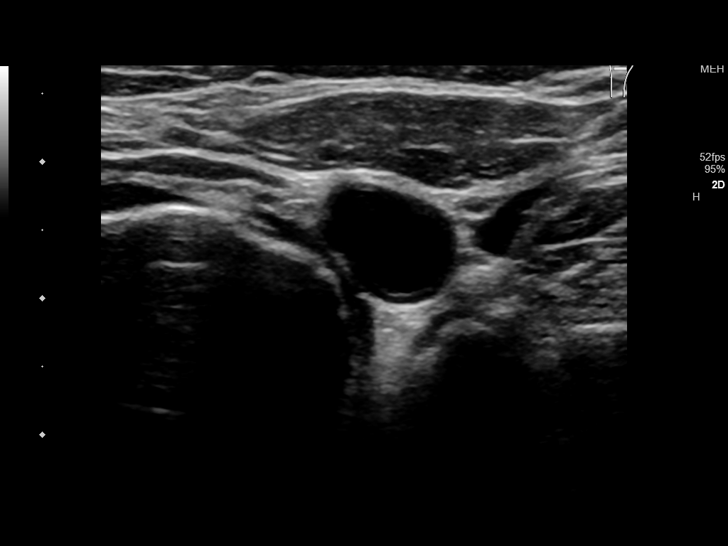
[im 52/70]
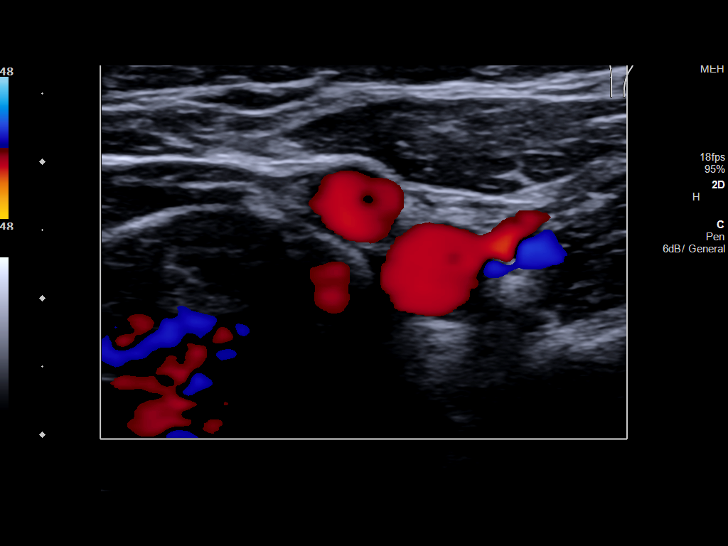
[im 58/70]
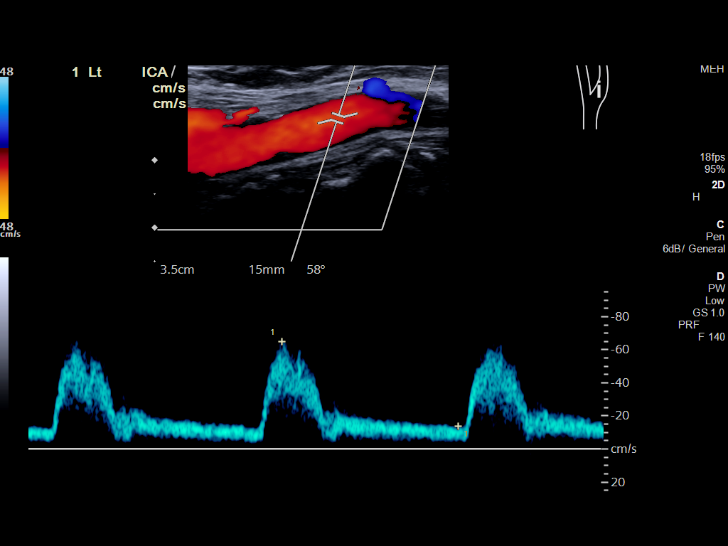
[im 64/70]
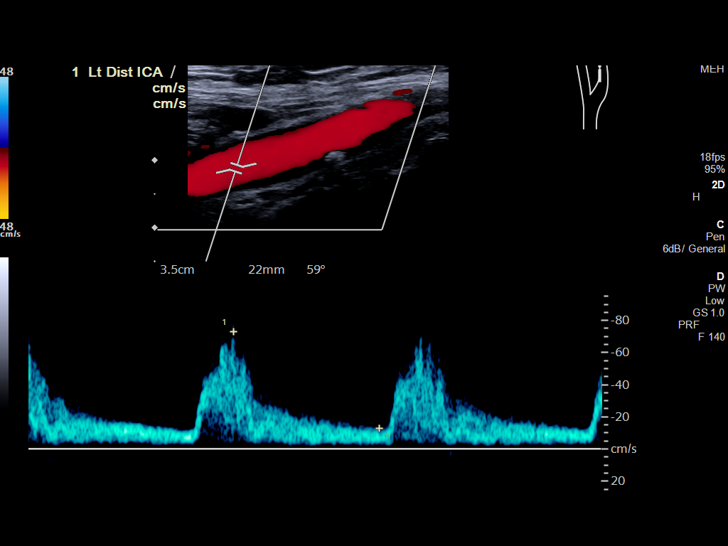
[im 70/70]
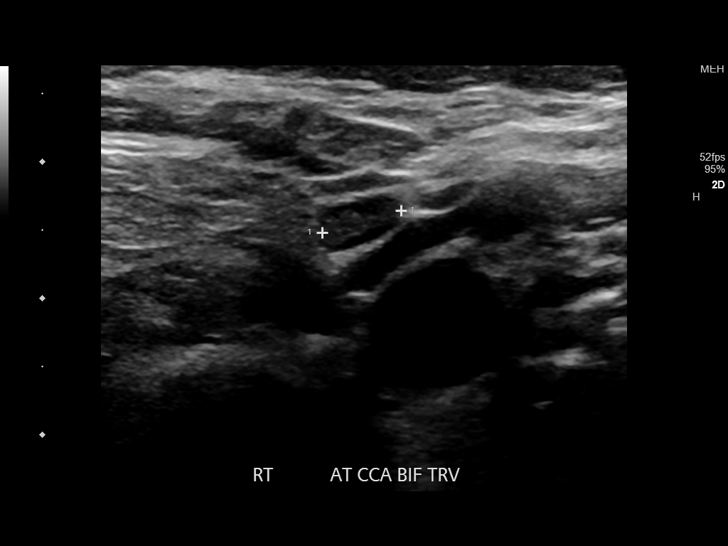

[13 of 24 positions shown; findings below may reference images not displayed]

FINDINGS: Criteria: Quantification of carotid stenosis is based on velocity
parameters that correlate the residual internal carotid diameter
with NASCET-based stenosis levels, using the diameter of the distal
internal carotid lumen as the denominator for stenosis measurement.

The following velocity measurements were obtained:

RIGHT
ICA: 74/10 cm/sec
CCA: 95/13 cm/sec

SYSTOLIC ICA/CCA RATIO:

ECA:  81 cm/sec

LEFT

ICA: 72/6 cm/sec

CCA: 94/30 cm/sec

SYSTOLIC ICA/CCA RATIO:

ECA:  81 cm/sec

RIGHT CAROTID ARTERY: Minor echogenic shadowing plaque formation. No
hemodynamically significant right ICA stenosis, velocity elevation,
or turbulent flow. Degree of narrowing less than 50%.

RIGHT VERTEBRAL ARTERY:  Antegrade

LEFT CAROTID ARTERY: Similar scattered minor echogenic plaque
formation. No hemodynamically significant left ICA stenosis,
velocity elevation, or turbulent flow.

LEFT VERTEBRAL ARTERY:  Antegrade
IMPRESSION: Stable exam. Minor carotid atherosclerosis. No hemodynamically
significant ICA stenosis. Degree of narrowing remains less than 50%
bilaterally by ultrasound criteria.

Patent antegrade vertebral flow bilaterally

## 2019-04-14 NOTE — Progress Notes (Signed)
ICD remote 

## 2019-05-23 ENCOUNTER — Ambulatory Visit
Admission: RE | Admit: 2019-05-23 | Discharge: 2019-05-23 | Disposition: A | Discharge status: Home / Self Care | Payer: Medicare Other | Source: Ambulatory Visit | Attending: Internal Medicine | Admitting: Internal Medicine

## 2019-05-23 DIAGNOSIS — Z1231 Encounter for screening mammogram for malignant neoplasm of breast: Secondary | ICD-10-CM | POA: Insufficient documentation

## 2019-06-13 ENCOUNTER — Ambulatory Visit (INDEPENDENT_AMBULATORY_CARE_PROVIDER_SITE_OTHER): Payer: Medicare Other | Admitting: *Deleted

## 2019-06-13 DIAGNOSIS — I519 Heart disease, unspecified: Secondary | ICD-10-CM | POA: Diagnosis not present

## 2019-06-13 LAB — CUP PACEART REMOTE DEVICE CHECK
Battery Remaining Longevity: 89 mo
Battery Remaining Percentage: 86 %
Battery Voltage: 3.04 V
Brady Statistic AP VP Percent: 1 %
Brady Statistic AP VS Percent: 40 %
Brady Statistic AS VP Percent: 1 %
Brady Statistic AS VS Percent: 58 %
Brady Statistic RA Percent Paced: 38 %
Brady Statistic RV Percent Paced: 1 %
Date Time Interrogation Session: 20210309020016
HighPow Impedance: 81 Ohm
HighPow Impedance: 81 Ohm
Implantable Lead Implant Date: 20191203
Implantable Lead Implant Date: 20191203
Implantable Lead Location: 753859
Implantable Lead Location: 753860
Implantable Pulse Generator Implant Date: 20191203
Lead Channel Impedance Value: 540 Ohm
Lead Channel Impedance Value: 560 Ohm
Lead Channel Pacing Threshold Amplitude: 0.5 V
Lead Channel Pacing Threshold Amplitude: 0.625 V
Lead Channel Pacing Threshold Pulse Width: 0.5 ms
Lead Channel Pacing Threshold Pulse Width: 0.5 ms
Lead Channel Sensing Intrinsic Amplitude: 0.5 mV
Lead Channel Sensing Intrinsic Amplitude: 4.8 mV
Lead Channel Setting Pacing Amplitude: 0.875
Lead Channel Setting Pacing Amplitude: 2 V
Lead Channel Setting Pacing Pulse Width: 0.5 ms
Lead Channel Setting Sensing Sensitivity: 0.5 mV
Pulse Gen Serial Number: 9854527

## 2019-06-13 NOTE — Progress Notes (Signed)
ICD Remote  

## 2019-06-20 NOTE — Progress Notes (Signed)
Electrophysiology Office Note Date: 06/22/2019  ID:  Allison Mclean, DOB May 09, 1935, MRN [id number]  PCP: Idelle Crouch, MD Primary Cardiologist: [place] Electrophysiologist: [place]  CC: Routine ICD follow-up  Allison Mclean is a 84 y.o. female seen today for Dr Rayann Heman.  She presents today for routine electrophysiology followup.  Since last being seen in our clinic, the patient reports doing very well. She has had both doses of her COVID vaccine. 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  She denies chest pain, sustained palpitations, dyspnea, PND, orthopnea, nausea, vomiting, dizziness, syncope, edema, weight gain, or early satiety.  She has not had ICD shocks.   Device History: STJ dual chamber ICD implanted 2019 for syncope and reduced EF  History of appropriate therapy: No History of AAD therapy: Yes - amiodarone for NSVT    Past Medical History:  Diagnosis Date  . Acid reflux 11/22/2014  . 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 Chronic systolic heart failure ([place])   . GERD (gastroesophageal reflux disease)   . History of hiatal hernia    s/p  . History of kidney stones   . History of lumbar fusion (12/19/2013) (posterior lumbar fusion from L2-L5) 07/02/2015   Pedicle screws and posterior hardware at L5 L4-L3 and L2. Neil disc block at the L4-5 disc space. Slight anterior subluxation of L4 on L5. Different configuration and this had L3-4. Degenerative changes of the lumbar disc spaces. L5-S1 disc space well-maintained .   Marland Kitchen Hyperlipidemia   . Hypertension   . Jackhammer esophagus   . Opiate use (10 MME/day) 07/02/2015  . Osteopenia, senile 07/02/2015  . Thoracic central spinal stenosis (T7-8, T8-9, T9-10, T10-11, T11-12, and T12-L1) 07/02/2015  . Thoracic facet syndrome (Left) 07/02/2015  . Thoracic foraminal stenosis (Moderate to severe at: Right T5; Bilateral T9; Left T11) 07/02/2015  . Thoracic spondylosis with radiculopathy (Left) 07/09/2015  . Wears dentures    partial lower   Past Surgical History:  Procedure Laterality Date  . ABDOMINAL EXPOSURE N/A 06/02/2016   Procedure: ABDOMINAL EXPOSURE;  Surgeon: Angelia Mould, MD;  Location: [place];  Service: Vascular;  Laterality: N/A;  . ABDOMINAL HYSTERECTOMY    . ANTERIOR LAT LUMBAR FUSION N/A 06/02/2016   Procedure: L1-2 Left Anterior lateral lumbar interbody fusion;  Surgeon: Erline Levine, MD;  Location: [place];  Service: Neurosurgery;  Laterality: N/A;  L1-2 Anterior lateral lumbar interbody fusion  . ANTERIOR LUMBAR FUSION N/A 06/02/2016   Procedure: Lumbar five -sacral one Anterior lumbar interbody fusion with Dr. Deitra Mayo; Lumbar one-two Left Anterior lateral lumbar interbody fusion;  Surgeon: Erline Levine, MD;  Location: [place];  Service: Neurosurgery;  Laterality: N/A;  . APPENDECTOMY    . APPLICATION OF INTRAOPERATIVE CT SCAN N/A 06/05/2016   Procedure: APPLICATION OF INTRAOPERATIVE CT SCAN;  Surgeon: Erline Levine, MD;  Location: [place];  Service: Neurosurgery;  Laterality: N/A;  . BACK SURGERY     2005, 2006, 2015  . CARDIAC CATHETERIZATION N/A 02/07/2015   Procedure: Left Heart Cath and Coronary Angiography;  Surgeon: Teodoro Spray, MD;  Location: [place] CV LAB;  Service: Cardiovascular;  Laterality: N/A;  . CHOLECYSTECTOMY    . ESOPHAGOGASTRODUODENOSCOPY (EGD) WITH PROPOFOL N/A 02/03/2016   Procedure: ESOPHAGOGASTRODUODENOSCOPY (EGD) WITH PROPOFOL;  Surgeon: Lucilla Lame, MD;  Location: [place];  Service: Endoscopy;  Laterality: N/A;  . ESOPHAGOGASTRODUODENOSCOPY (EGD) WITH PROPOFOL N/A 03/02/2016   Procedure: ESOPHAGOGASTRODUODENOSCOPY (EGD) WITH PROPOFOL;  Surgeon: Lucilla Lame, MD;  Location:  [place];  Service: Endoscopy;  Laterality: N/A;  . ICD IMPLANT N/A 03/08/2018   St. Jude Medical Fortify Assura DR model [id number] (serial  Number [id number]) ICD by Dr Rayann Heman for treatment of syncope and reduced EF.  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  LEFT HEART CATH AND CORONARY ANGIOGRAPHY Left 12/21/2017   Procedure: LEFT HEART CATH AND CORONARY ANGIOGRAPHY;  Surgeon: Isaias Cowman, MD;  Location: [place] CV LAB;  Service: Cardiovascular;  Laterality: Left;  . NISSEN FUNDOPLICATION    . POSTERIOR LUMBAR FUSION 4 LEVEL N/A 06/05/2016   Procedure: Thoracic six to pelvis fixation with Smith-Peterson Thoracic osteotomies at Thoracic ten-Thoracic Twelve with AIRO, Removal hardware lumbar two to lumbar five;  Surgeon: Erline Levine, MD;  Location: [place];  Service: Neurosurgery;  Laterality: N/A;  . SPINAL GROWTH RODS Bilateral 2018  . SYMPATHECTOMY     ? improve blood flow to legs  . TONSILECTOMY, ADENOIDECTOMY, BILATERAL MYRINGOTOMY AND TUBES      Current Outpatient Medications  Medication Sig Dispense Refill  . amLODipine (NORVASC) 2.5 MG tablet Take 2.5 mg by mouth every evening.     Marland Kitchen aspirin EC 81 MG tablet Take 81 mg by mouth at bedtime.     . calcium citrate-vitamin D (CITRACAL+D) 315-200 MG-UNIT tablet Take 1 tablet by mouth daily.     . clobetasol ointment (TEMOVATE) [id number] % Apply 1 application topically 2 (two) times daily as needed (sore areas). Avoid face and skin folds  1  . fluticasone (FLONASE) 50 MCG/ACT nasal spray Place 2 sprays into both nostrils at bedtime as needed for allergies.     . hydrochlorothiazide (HYDRODIURIL) 25 MG tablet Take 1 tablet by mouth daily.    . Multiple Vitamins-Minerals (OCUVITE EXTRA PO) Take 1 tablet by mouth daily.     Marland Kitchen nystatin (NYSTATIN) powder Apply 1 g topically daily as needed (skin irritation).     . pravastatin (PRAVACHOL) 80 MG tablet Take 80 mg by mouth at bedtime.  3  . sennosides-docusate sodium (SENOKOT-S) 8.6-50 MG tablet Take 1 tablet by mouth daily as needed for constipation.    . traZODone (DESYREL) 50 MG tablet Take 50 mg by mouth at bedtime.    . valsartan-hydrochlorothiazide (DIOVAN-HCT) 160-12.5 MG per tablet Take 1 tablet by mouth daily.  3  . potassium chloride SA  (K-DUR,KLOR-CON) 20 MEQ tablet Take 20 mEq by mouth every evening.      No current facility-administered medications for this visit.    Allergies:   Atorvastatin and Rosuvastatin   Social History: Social History   Socioeconomic History  . Marital status: Married    Spouse name: Danford   . Number of children: 3  . Years of education: Not on file  . Highest education level: High school graduate  Occupational History  . Not on file  Tobacco Use  . Smoking status: Never Smoker  . Smokeless tobacco: Never Used  Substance and Sexual Activity  . Alcohol use: No  . Drug use: No  . Sexual activity: Not on file  Other Topics Concern  . Not on file  Social History Narrative  . Not on file   Social Determinants of Health   Financial Resource Strain:   . Difficulty of Paying Living Expenses:   Food Insecurity:   . Worried About Charity fundraiser in the Last Year:   . [place] in the Last Year:   Transportation Needs:   . Lack of  Transportation (Medical):   Marland Kitchen Lack of Transportation (Non-Medical):   Physical Activity:   . Days of Exercise per Week:   . Minutes of Exercise per Session:   Stress:   . Feeling of Stress :   Social Connections:   . Frequency of Communication with Friends and Family:   . Frequency of Social Gatherings with Friends and Family:   . Attends Religious Services:   . Active Member of Clubs or Organizations:   . Attends Archivist Meetings:   Marland Kitchen Marital Status:   Intimate Partner Violence:   . Fear of Current or Ex-Partner:   . Emotionally Abused:   Marland Kitchen Physically Abused:   . Sexually Abused:     Family History: Family History  Problem Relation Age of Onset  . Diabetes Mother   . Arthritis Mother   . Heart disease Mother   . Hypertension Mother   . Diabetes Father   . Asthma Father   . Heart disease Father   . Hypertension Father   . Stroke Father   . Breast cancer Neg Hx     Review of Systems: All other systems  reviewed and are otherwise negative except as noted above.   Physical Exam: VS:  BP 120/60   Pulse 71   Ht 5' (1.524 m)   Wt 176 lb 9.6 oz (80.1 kg)   SpO2 97%   BMI 34.49 kg/m  , BMI Body mass index is 34.49 kg/m.  GEN- The patient is well appearing, alert and oriented x 3 today.   HEENT: normocephalic, atraumatic; sclera clear, conjunctiva pink; hearing intact; oropharynx clear; neck supple  Lungs- Clear to ausculation bilaterally, normal work of breathing.  No wheezes, rales, rhonchi Heart- Regular rate and rhythm  GI- soft, non-tender, non-distended, bowel sounds present  Extremities- no clubbing, cyanosis, or edema  MS- no significant deformity or atrophy Skin- warm and dry, no rash or lesion; ICD pocket well healed Psych- euthymic mood, full affect Neuro- strength and sensation are intact  ICD interrogation- reviewed in detail today,  See PACEART report  EKG:  EKG is not ordered today.  Recent Labs: No results found for requested labs within last 8760 hours.   Wt Readings from Last 3 Encounters:  06/22/19 176 lb 9.6 oz (80.1 kg)  06/13/18 175 lb (79.4 kg)  03/09/18 167 lb 12.8 oz (76.1 kg)     Other studies Reviewed: Additional studies/ records that were reviewed today include: Dr Jackalyn Lombard office notes   Assessment and Plan:  1.  Chronic systolic dysfunction euvolemic today Stable on an appropriate medical regimen Normal ICD function See Pace Art report No changes today  2.  NSVT On low dose amiodarone - we discussed discontinuing again today.  Will discontinue with no recent arrhythmias    Current medicines are reviewed at length with the patient today.   The patient does not have concerns regarding her medicines.  The following changes were made today:  none  Labs/ tests ordered today include:  Orders Placed This Encounter  Procedures  . CUP PACEART INCLINIC DEVICE CHECK     Disposition:   Follow up with [place], 1 year Dr Rayann Heman      Signed, Chanetta Marshall, NP 06/22/2019 10:54 AM  [place] [address] [phone] (office) [phone] (fax)

## 2019-06-21 ENCOUNTER — Encounter: Payer: Self-pay | Admitting: Nurse Practitioner

## 2019-06-22 ENCOUNTER — Other Ambulatory Visit: Payer: Self-pay

## 2019-06-22 ENCOUNTER — Encounter: Payer: Self-pay | Admitting: Nurse Practitioner

## 2019-06-22 ENCOUNTER — Ambulatory Visit (INDEPENDENT_AMBULATORY_CARE_PROVIDER_SITE_OTHER): Payer: Medicare Other | Admitting: Nurse Practitioner

## 2019-06-22 VITALS — BP 120/60 | HR 71 | Ht 60.0 in | Wt 176.6 lb

## 2019-06-22 DIAGNOSIS — I472 Ventricular tachycardia: Secondary | ICD-10-CM

## 2019-06-22 DIAGNOSIS — I519 Heart disease, unspecified: Secondary | ICD-10-CM | POA: Diagnosis not present

## 2019-06-22 DIAGNOSIS — I4729 Other ventricular tachycardia: Secondary | ICD-10-CM

## 2019-06-22 LAB — CUP PACEART INCLINIC DEVICE CHECK
Battery Remaining Longevity: 91 mo
Brady Statistic RA Percent Paced: 38 %
Brady Statistic RV Percent Paced: 0.53 %
Date Time Interrogation Session: 20210318105306
HighPow Impedance: 81 Ohm
Implantable Lead Implant Date: 20191203
Implantable Lead Implant Date: 20191203
Implantable Lead Location: 753859
Implantable Lead Location: 753860
Implantable Pulse Generator Implant Date: 20191203
Lead Channel Impedance Value: 525 Ohm
Lead Channel Impedance Value: 587.5 Ohm
Lead Channel Pacing Threshold Amplitude: 0.5 V
Lead Channel Pacing Threshold Amplitude: 0.5 V
Lead Channel Pacing Threshold Amplitude: 0.5 V
Lead Channel Pacing Threshold Pulse Width: 0.5 ms
Lead Channel Pacing Threshold Pulse Width: 0.5 ms
Lead Channel Pacing Threshold Pulse Width: 0.5 ms
Lead Channel Sensing Intrinsic Amplitude: 0.7 mV
Lead Channel Sensing Intrinsic Amplitude: 4.7 mV
Lead Channel Setting Pacing Amplitude: 0.75 V
Lead Channel Setting Pacing Amplitude: 2 V
Lead Channel Setting Pacing Pulse Width: 0.5 ms
Lead Channel Setting Sensing Sensitivity: 0.5 mV
Pulse Gen Serial Number: 9854527

## 2019-06-22 NOTE — Patient Instructions (Addendum)
Medication Instructions:  STOP AMIODARONE *If you need a refill on your cardiac medications before your next appointment, please call your pharmacy*   Lab Work: none If you have labs (blood work) drawn today and your tests are completely normal, you will receive your results only by: Marland Kitchen MyChart Message (if you have MyChart) OR . A paper copy in the mail If you have any lab test that is abnormal or we need to change your treatment, we will call you to review the results.   Testing/Procedures: none   Follow-Up: At [facility], you and your health needs are our priority.  As part of our continuing mission to provide you with exceptional heart care, we have created designated Provider Care Teams.  These Care Teams include your primary Cardiologist (physician) and Advanced Practice Providers (APPs -  Physician Assistants and Nurse Practitioners) who all work together to provide you with the care you need, when you need it.  We recommend signing up for the patient portal called "MyChart".  Sign up information is provided on this After Visit Summary.  MyChart is used to connect with patients for Virtual Visits (Telemedicine).  Patients are able to view lab/test results, encounter notes, upcoming appointments, etc.  Non-urgent messages can be sent to your provider as well.   To learn more about what you can do with MyChart, go to [email or web address].    Your next appointment:   1 year(s)  The format for your next appointment:   Either In Person or Virtual  Provider:   Dr Rayann Heman   Other Instructions Remote monitoring is used to monitor your ICD from home. This monitoring reduces the number of office visits required to check your device to one time per year. It allows [place] to keep an eye on the functioning of your device to ensure it is working properly. You are scheduled for a device check from home on 09/12/19. You may send your transmission at any time that day. If you have a wireless  device, the transmission will be sent automatically. After your physician reviews your transmission, you will receive a postcard with your next transmission date.

## 2019-09-12 ENCOUNTER — Ambulatory Visit (INDEPENDENT_AMBULATORY_CARE_PROVIDER_SITE_OTHER): Payer: Medicare Other | Admitting: *Deleted

## 2019-09-12 DIAGNOSIS — I428 Other cardiomyopathies: Secondary | ICD-10-CM | POA: Diagnosis not present

## 2019-09-12 LAB — CUP PACEART REMOTE DEVICE CHECK
Battery Remaining Longevity: 87 mo
Battery Remaining Percentage: 83 %
Battery Voltage: 3.02 V
Brady Statistic AP VP Percent: 1 %
Brady Statistic AP VS Percent: 36 %
Brady Statistic AS VP Percent: 1 %
Brady Statistic AS VS Percent: 62 %
Brady Statistic RA Percent Paced: 34 %
Brady Statistic RV Percent Paced: 1 %
Date Time Interrogation Session: 20210608020015
HighPow Impedance: 87 Ohm
HighPow Impedance: 87 Ohm
Implantable Lead Implant Date: 20191203
Implantable Lead Implant Date: 20191203
Implantable Lead Location: 753859
Implantable Lead Location: 753860
Implantable Pulse Generator Implant Date: 20191203
Lead Channel Impedance Value: 530 Ohm
Lead Channel Impedance Value: 630 Ohm
Lead Channel Pacing Threshold Amplitude: 0.5 V
Lead Channel Pacing Threshold Amplitude: 0.5 V
Lead Channel Pacing Threshold Pulse Width: 0.5 ms
Lead Channel Pacing Threshold Pulse Width: 0.5 ms
Lead Channel Sensing Intrinsic Amplitude: 1.6 mV
Lead Channel Sensing Intrinsic Amplitude: 6 mV
Lead Channel Setting Pacing Amplitude: 0.75 V
Lead Channel Setting Pacing Amplitude: 2 V
Lead Channel Setting Pacing Pulse Width: 0.5 ms
Lead Channel Setting Sensing Sensitivity: 0.5 mV
Pulse Gen Serial Number: 9854527

## 2019-09-13 NOTE — Progress Notes (Signed)
Remote ICD transmission.   

## 2019-09-22 ENCOUNTER — Other Ambulatory Visit (INDEPENDENT_AMBULATORY_CARE_PROVIDER_SITE_OTHER): Payer: Self-pay | Admitting: Vascular Surgery

## 2019-09-22 DIAGNOSIS — I6523 Occlusion and stenosis of bilateral carotid arteries: Secondary | ICD-10-CM

## 2019-09-25 ENCOUNTER — Ambulatory Visit (INDEPENDENT_AMBULATORY_CARE_PROVIDER_SITE_OTHER): Payer: Medicare Other | Admitting: Vascular Surgery

## 2019-09-25 ENCOUNTER — Ambulatory Visit (INDEPENDENT_AMBULATORY_CARE_PROVIDER_SITE_OTHER): Payer: Medicare Other

## 2019-09-25 ENCOUNTER — Encounter (INDEPENDENT_AMBULATORY_CARE_PROVIDER_SITE_OTHER): Payer: Self-pay | Admitting: Vascular Surgery

## 2019-09-25 ENCOUNTER — Other Ambulatory Visit: Payer: Self-pay

## 2019-09-25 VITALS — BP 144/74 | HR 68 | Resp 16 | Wt 175.2 lb

## 2019-09-25 DIAGNOSIS — Z9889 Other specified postprocedural states: Secondary | ICD-10-CM | POA: Diagnosis not present

## 2019-09-25 DIAGNOSIS — E782 Mixed hyperlipidemia: Secondary | ICD-10-CM

## 2019-09-25 DIAGNOSIS — I10 Essential (primary) hypertension: Secondary | ICD-10-CM

## 2019-09-25 DIAGNOSIS — I6523 Occlusion and stenosis of bilateral carotid arteries: Secondary | ICD-10-CM

## 2019-09-25 NOTE — Progress Notes (Signed)
MRN : [id number]  Allison Mclean is a 84 y.o. ([birth year]) female who presents with chief complaint of  Chief Complaint  Patient presents with  . Follow-up    ultrasound follow up  .  History of Present Illness:   The patient is seen for follow up evaluation of carotid stenosis. The carotid stenosis followed by ultrasound.   The patient denies amaurosis fugax. There is no recent history of TIA symptoms or focal motor deficits. There is no prior documented CVA.  The patient is taking enteric-coated aspirin 81 mg daily.  There is no history of migraine headaches. There is no history of seizures.  The patient has a history of coronary artery disease, no recent episodes of angina or shortness of breath. The patient denies PAD or claudication symptoms. There is a history of hyperlipidemia which is being treated with a statin.    Carotid Duplex done today shows <20% (trivial stenosis).  No change compared to last study.  Current Meds  Medication Sig  . amLODipine (NORVASC) 2.5 MG tablet Take 2.5 mg by mouth every evening.   Marland Kitchen aspirin EC 81 MG tablet Take 81 mg by mouth at bedtime.   . calcium citrate-vitamin D (CITRACAL+D) 315-200 MG-UNIT tablet Take 1 tablet by mouth daily.   . fluticasone (FLONASE) 50 MCG/ACT nasal spray Place 2 sprays into both nostrils at bedtime as needed for allergies.   . furosemide (LASIX) 20 MG tablet Take 20 mg by mouth daily as needed.  . Multiple Vitamins-Minerals (OCUVITE EXTRA PO) Take 1 tablet by mouth daily.   Marland Kitchen nystatin (NYSTATIN) powder Apply 1 g topically daily as needed (skin irritation).   . potassium chloride SA (K-DUR,KLOR-CON) 20 MEQ tablet Take 20 mEq by mouth every evening.   . pravastatin (PRAVACHOL) 80 MG tablet Take 80 mg by mouth at bedtime.  . sennosides-docusate sodium (SENOKOT-S) 8.6-50 MG tablet Take 1 tablet by mouth daily as needed for constipation.  . valsartan-hydrochlorothiazide (DIOVAN-HCT) 160-12.5 MG per tablet Take 1 tablet  by mouth daily.    Past Medical History:  Diagnosis Date  . Acid reflux 11/22/2014  . 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 Chronic systolic heart failure ([place])   . GERD (gastroesophageal reflux disease)   . History of hiatal hernia    s/p  . History of kidney stones   . History of lumbar fusion (12/19/2013) (posterior lumbar fusion from L2-L5) 07/02/2015   Pedicle screws and posterior hardware at L5 L4-L3 and L2. Neil disc block at the L4-5 disc space. Slight anterior subluxation of L4 on L5. Different configuration and this had L3-4. Degenerative changes of the lumbar disc spaces. L5-S1 disc space well-maintained .   Marland Kitchen Hyperlipidemia   . Hypertension   . Jackhammer esophagus   . Opiate use (10 MME/day) 07/02/2015  . Osteopenia, senile 07/02/2015  . Thoracic central spinal stenosis (T7-8, T8-9, T9-10, T10-11, T11-12, and T12-L1) 07/02/2015  . Thoracic facet syndrome (Left) 07/02/2015  . Thoracic foraminal stenosis (Moderate to severe at: Right T5; Bilateral T9; Left T11) 07/02/2015  . Thoracic spondylosis with radiculopathy (Left) 07/09/2015  . Wears dentures    partial lower    Past Surgical History:  Procedure Laterality Date  . ABDOMINAL EXPOSURE N/A 06/02/2016   Procedure: ABDOMINAL EXPOSURE;  Surgeon: Angelia Mould, MD;  Location: [facility];  Service: Vascular;  Laterality: N/A;  . ABDOMINAL HYSTERECTOMY    . ANTERIOR LAT LUMBAR FUSION N/A 06/02/2016   Procedure: L1-2 Left Anterior lateral lumbar interbody fusion;  Surgeon: Erline Levine, MD;  Location: [facility];  Service: Neurosurgery;  Laterality: N/A;  L1-2 Anterior lateral lumbar interbody fusion  . ANTERIOR LUMBAR FUSION N/A 06/02/2016   Procedure: Lumbar five -sacral one Anterior lumbar interbody fusion with Dr. Deitra Mayo; Lumbar one-two Left Anterior lateral lumbar interbody fusion;  Surgeon: Erline Levine, MD;  Location: [facility];  Service: Neurosurgery;  Laterality: N/A;  . APPENDECTOMY    . APPLICATION OF INTRAOPERATIVE CT SCAN  N/A 06/05/2016   Procedure: APPLICATION OF INTRAOPERATIVE CT SCAN;  Surgeon: Erline Levine, MD;  Location: [facility];  Service: Neurosurgery;  Laterality: N/A;  . BACK SURGERY     2005, 2006, 2015  . CARDIAC CATHETERIZATION N/A 02/07/2015   Procedure: Left Heart Cath and Coronary Angiography;  Surgeon: Teodoro Spray, MD;  Location: [facility];  Service: Cardiovascular;  Laterality: N/A;  . CHOLECYSTECTOMY    . ESOPHAGOGASTRODUODENOSCOPY (EGD) WITH PROPOFOL N/A 02/03/2016   Procedure: ESOPHAGOGASTRODUODENOSCOPY (EGD) WITH PROPOFOL;  Surgeon: Lucilla Lame, MD;  Location: [facility];  Service: Endoscopy;  Laterality: N/A;  . ESOPHAGOGASTRODUODENOSCOPY (EGD) WITH PROPOFOL N/A 03/02/2016   Procedure: ESOPHAGOGASTRODUODENOSCOPY (EGD) WITH PROPOFOL;  Surgeon: Lucilla Lame, MD;  Location: [facility];  Service: Endoscopy;  Laterality: N/A;  . ICD IMPLANT N/A 03/08/2018   St. Jude Medical Fortify Assura DR model [id number] (serial  Number [id number]) ICD by Dr Rayann Heman for treatment of syncope and reduced EF.  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 LEFT HEART CATH AND CORONARY ANGIOGRAPHY Left 12/21/2017   Procedure: LEFT HEART CATH AND CORONARY ANGIOGRAPHY;  Surgeon: Isaias Cowman, MD;  Location: [facility];  Service: Cardiovascular;  Laterality: Left;  . NISSEN FUNDOPLICATION    . POSTERIOR LUMBAR FUSION 4 LEVEL N/A 06/05/2016   Procedure: Thoracic six to pelvis fixation with Smith-Peterson Thoracic osteotomies at Thoracic ten-Thoracic Twelve with AIRO, Removal hardware lumbar two to lumbar five;  Surgeon: Erline Levine, MD;  Location: [facility];  Service: Neurosurgery;  Laterality: N/A;  . SPINAL GROWTH RODS Bilateral 2018  . SYMPATHECTOMY     ? improve blood flow to legs  . TONSILECTOMY, ADENOIDECTOMY, BILATERAL MYRINGOTOMY AND TUBES      Social History Social History   Tobacco Use  . Smoking status: Never Smoker  . Smokeless tobacco: Never Used  Vaping Use  .  Vaping Use: Never used  Substance Use Topics  . Alcohol use: No  . Drug use: No    Family History Family History  Problem Relation Age of Onset  . Diabetes Mother   . Arthritis Mother   . Heart disease Mother   . Hypertension Mother   . Diabetes Father   . Asthma Father   . Heart disease Father   . Hypertension Father   . Stroke Father   . Breast cancer Neg Hx     Allergies  Allergen Reactions  . Atorvastatin Other (See Comments)    Chest pain    . Rosuvastatin Other (See Comments)    Causes chest pain       REVIEW OF SYSTEMS (Negative unless checked)  Constitutional: [] Weight loss  [] Fever  [] Chills Cardiac: [] Chest pain   [] Chest pressure   [] Palpitations   [] Shortness of breath when laying flat   [] Shortness of breath with exertion. Vascular:  [] Pain in legs with walking   [] Pain in legs at rest  [] History of DVT   [] Phlebitis   [] Swelling in legs   [] Varicose veins   [] Non-healing  ulcers Pulmonary:   [] Uses home oxygen   [] Productive cough   [] Hemoptysis   [] Wheeze  [] COPD   [] Asthma Neurologic:  [] Dizziness   [] Seizures   [] History of stroke   [] History of TIA  [] Aphasia   [] Vissual changes   [] Weakness or numbness in arm   [] Weakness or numbness in leg Musculoskeletal:   [] Joint swelling   [x] Joint pain   [x] Low back pain Hematologic:  [] Easy bruising  [] Easy bleeding   [] Hypercoagulable state   [] Anemic Gastrointestinal:  [] Diarrhea   [] Vomiting  [] Gastroesophageal reflux/heartburn   [] Difficulty swallowing. Genitourinary:  [] Chronic kidney disease   [] Difficult urination  [] Frequent urination   [] Blood in urine Skin:  [] Rashes   [] Ulcers  Psychological:  [] History of anxiety   []  History of major depression.  Physical Examination  Vitals:   09/25/19 1012  BP: (!) 144/74  Pulse: 68  Resp: 16  Weight: 175 lb 3.2 oz (79.5 kg)   Body mass index is 34.22 kg/m. Gen: WD/WN, NAD Head: [place]/AT, No temporalis wasting.  Ear/Nose/Throat: Hearing grossly intact,  nares w/o erythema or drainage Eyes: PER, EOMI, sclera nonicteric.  Neck: Supple, no large masses.   Pulmonary:  Good air movement, no audible wheezing bilaterally, no use of accessory muscles.  Cardiac: RRR, no JVD Vascular:  Vessel Right Left  Radial Palpable Palpable  Brachial Palpable Palpable  Carotid Palpable Palpable  Gastrointestinal: Non-distended. No guarding/no peritoneal signs.  Musculoskeletal: M/S 5/5 throughout.  No deformity or atrophy.  Neurologic: CN 2-12 intact. Symmetrical.  Speech is fluent. Motor exam as listed above. Psychiatric: Judgment intact, Mood & affect appropriate for pt's clinical situation. Dermatologic: No rashes or ulcers noted.  No changes consistent with cellulitis.   CBC Lab Results  Component Value Date   WBC 4.8 02/14/2018   HGB 12.8 02/14/2018   HCT 40.0 02/14/2018   MCV 85 02/14/2018   PLT 231 02/14/2018    BMET    Component Value Date/Time   NA 137 03/09/2018 0340   NA 138 02/14/2018 1516   K 4.0 03/09/2018 0340   CL 99 03/09/2018 0340   CO2 27 03/09/2018 0340   GLUCOSE 96 03/09/2018 0340   BUN 14 03/09/2018 0340   BUN 11 02/14/2018 1516   CREATININE 1.02 (H) 03/09/2018 0340   CREATININE 1.09 08/28/2011 1309   CALCIUM 9.6 03/09/2018 0340   GFRNONAA 52 (L) 03/09/2018 0340   GFRNONAA 50 (L) 08/28/2011 1309   GFRAA >60 03/09/2018 0340   GFRAA 58 (L) 08/28/2011 1309   CrCl cannot be calculated (Patient's most recent lab result is older than the maximum 21 days allowed.).  COAG Lab Results  Component Value Date   INR 0.93 09/10/2017    Radiology CUP PACEART REMOTE DEVICE CHECK  Result Date: 09/12/2019 Scheduled remote reviewed. Normal device function.  Next remote 91 days- JBox, RN/CVRS    Assessment/Plan 1. Bilateral carotid artery stenosis Recommend:  Given the patient's asymptomatic subcritical stenosis no further invasive testing or surgery at this time.  Duplex ultrasound shows <20% stenosis bilaterally which  has been unchanged when compared to the previous studies.  Continue antiplatelet therapy as prescribed Continue management of CAD, HTN and Hyperlipidemia Healthy heart diet,  encouraged exercise at least 4 times per week  Given the stable <20% bilateral carotid stenosis in association with the patient's age the patient will follow up PRN.  The patient is told that if symptoms of a TIA should occur then he should go to the ER and I  should be notified, as this would change the management course.  The patient voices understanding.   2. Essential (primary) hypertension Continue antihypertensive medications as already ordered, these medications have been reviewed and there are no changes at this time.   3. Mixed hyperlipidemia Continue statin as ordered and reviewed, no changes at this time   4. Status post lumbar spine operation Continue NSAID medications as already ordered, these medications have been reviewed and there are no changes at this time.  Continued activity and therapy was stressed.   Hortencia Pilar, MD  09/25/2019 12:46 PM

## 2019-12-07 ENCOUNTER — Telehealth: Payer: Self-pay | Admitting: Internal Medicine

## 2019-12-07 NOTE — Telephone Encounter (Signed)
New message:      Patient would like to cancel her apt for Home remote check patient will not be at home. For 12/12/19.

## 2019-12-08 NOTE — Telephone Encounter (Signed)
I called the pt to let her know I rescheduled her home remote appointment. The pt thanked me for my help.

## 2019-12-13 LAB — CUP PACEART REMOTE DEVICE CHECK
Battery Remaining Longevity: 85 mo
Battery Remaining Percentage: 81 %
Battery Voltage: 3.01 V
Brady Statistic AP VP Percent: 1 %
Brady Statistic AP VS Percent: 32 %
Brady Statistic AS VP Percent: 1 %
Brady Statistic AS VS Percent: 65 %
Brady Statistic RA Percent Paced: 30 %
Brady Statistic RV Percent Paced: 1 %
Date Time Interrogation Session: 20210907171956
HighPow Impedance: 89 Ohm
HighPow Impedance: 89 Ohm
Implantable Lead Implant Date: 20191203
Implantable Lead Implant Date: 20191203
Implantable Lead Location: 753859
Implantable Lead Location: 753860
Implantable Pulse Generator Implant Date: 20191203
Lead Channel Impedance Value: 530 Ohm
Lead Channel Impedance Value: 690 Ohm
Lead Channel Pacing Threshold Amplitude: 0.5 V
Lead Channel Pacing Threshold Amplitude: 0.5 V
Lead Channel Pacing Threshold Pulse Width: 0.5 ms
Lead Channel Pacing Threshold Pulse Width: 0.5 ms
Lead Channel Sensing Intrinsic Amplitude: 1.7 mV
Lead Channel Sensing Intrinsic Amplitude: 5.4 mV
Lead Channel Setting Pacing Amplitude: 0.75 V
Lead Channel Setting Pacing Amplitude: 2 V
Lead Channel Setting Pacing Pulse Width: 0.5 ms
Lead Channel Setting Sensing Sensitivity: 0.5 mV
Pulse Gen Serial Number: 9854527

## 2019-12-15 ENCOUNTER — Ambulatory Visit (INDEPENDENT_AMBULATORY_CARE_PROVIDER_SITE_OTHER): Payer: Medicare Other | Admitting: *Deleted

## 2019-12-15 DIAGNOSIS — I428 Other cardiomyopathies: Secondary | ICD-10-CM

## 2019-12-15 NOTE — Progress Notes (Signed)
Remote ICD transmission.   

## 2019-12-30 NOTE — Progress Notes (Signed)
01/02/2020 12:28 PM   Allison Mclean 1935/04/07 [id number]  Referring provider: Idelle Crouch, MD [facility] [place],  [place] [address] Chief Complaint  Patient presents with  . Urinary Incontinence    HPI: Allison Mclean is a 84 y.o. female who presents today for management of  urinary incontinence.   Today's PVR 22 mL.   She is having some urgency, frequency and leakage. She has accidents on occasion. These symptoms have been ongoing since 2018 s/p back surgery. She goes through 2 pads per day. She has leakage with laughing, couhing ans sneezing. Denies any infections. She has nocturia x 2. Denies hematuria or dysuria. She has never tried any medication.   She reports that she is drinking mostly water and some juice.   Denies constipation. Denies any vaginal issues.   The patient has a personal history of cardiomyopathy, idiopathic and is on Lasix 20 mg as needed. Managed By Dr. Saralyn Pilar. She normally takes it with episodes of bilateral lower extremity swelling.   Patient's husband recently passed away.   No vaginal symptoms.   PMH: Past Medical History:  Diagnosis Date  . Acid reflux 11/22/2014  . 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 Chronic systolic heart failure ([place])   . GERD (gastroesophageal reflux disease)   . History of hiatal hernia    s/p  . History of kidney stones   . History of lumbar fusion (12/19/2013) (posterior lumbar fusion from L2-L5) 07/02/2015   Pedicle screws and posterior hardware at L5 L4-L3 and L2. Neil disc block at the L4-5 disc space. Slight anterior subluxation of L4 on L5. Different configuration and this had L3-4. Degenerative changes of the lumbar disc spaces. L5-S1 disc space well-maintained .   Marland Kitchen Hyperlipidemia   . Hypertension   . Jackhammer esophagus   . Opiate use (10 MME/day) 07/02/2015  . Osteopenia, senile 07/02/2015  . Thoracic central spinal stenosis (T7-8, T8-9, T9-10, T10-11, T11-12, and T12-L1)  07/02/2015  . Thoracic facet syndrome (Left) 07/02/2015  . Thoracic foraminal stenosis (Moderate to severe at: Right T5; Bilateral T9; Left T11) 07/02/2015  . Thoracic spondylosis with radiculopathy (Left) 07/09/2015  . Wears dentures    partial lower    Surgical History: Past Surgical History:  Procedure Laterality Date  . ABDOMINAL EXPOSURE N/A 06/02/2016   Procedure: ABDOMINAL EXPOSURE;  Surgeon: Angelia Mould, MD;  Location: [facility];  Service: Vascular;  Laterality: N/A;  . ABDOMINAL HYSTERECTOMY    . ANTERIOR LAT LUMBAR FUSION N/A 06/02/2016   Procedure: L1-2 Left Anterior lateral lumbar interbody fusion;  Surgeon: Erline Levine, MD;  Location: [facility];  Service: Neurosurgery;  Laterality: N/A;  L1-2 Anterior lateral lumbar interbody fusion  . ANTERIOR LUMBAR FUSION N/A 06/02/2016   Procedure: Lumbar five -sacral one Anterior lumbar interbody fusion with Dr. Deitra Mayo; Lumbar one-two Left Anterior lateral lumbar interbody fusion;  Surgeon: Erline Levine, MD;  Location: [facility];  Service: Neurosurgery;  Laterality: N/A;  . APPENDECTOMY    . APPLICATION OF INTRAOPERATIVE CT SCAN N/A 06/05/2016   Procedure: APPLICATION OF INTRAOPERATIVE CT SCAN;  Surgeon: Erline Levine, MD;  Location: [facility];  Service: Neurosurgery;  Laterality: N/A;  . BACK SURGERY     2005, 2006, 2015  . CARDIAC CATHETERIZATION N/A 02/07/2015   Procedure: Left Heart Cath and Coronary Angiography;  Surgeon: Teodoro Spray, MD;  Location: [facility];  Service: Cardiovascular;  Laterality: N/A;  . CHOLECYSTECTOMY    . ESOPHAGOGASTRODUODENOSCOPY (  EGD) WITH PROPOFOL N/A 02/03/2016   Procedure: ESOPHAGOGASTRODUODENOSCOPY (EGD) WITH PROPOFOL;  Surgeon: Lucilla Lame, MD;  Location: [facility];  Service: Endoscopy;  Laterality: N/A;  . ESOPHAGOGASTRODUODENOSCOPY (EGD) WITH PROPOFOL N/A 03/02/2016   Procedure: ESOPHAGOGASTRODUODENOSCOPY (EGD) WITH PROPOFOL;  Surgeon: Lucilla Lame, MD;  Location: [facility];  Service: Endoscopy;  Laterality: N/A;  . ICD IMPLANT N/A 03/08/2018   St. Jude Medical Fortify Assura DR model [id number] (serial  Number [id number]) ICD by Dr Rayann Heman for treatment of syncope and reduced EF.  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 LEFT HEART CATH AND CORONARY ANGIOGRAPHY Left 12/21/2017   Procedure: LEFT HEART CATH AND CORONARY ANGIOGRAPHY;  Surgeon: Isaias Cowman, MD;  Location: [facility];  Service: Cardiovascular;  Laterality: Left;  . NISSEN FUNDOPLICATION    . POSTERIOR LUMBAR FUSION 4 LEVEL N/A 06/05/2016   Procedure: Thoracic six to pelvis fixation with Smith-Peterson Thoracic osteotomies at Thoracic ten-Thoracic Twelve with AIRO, Removal hardware lumbar two to lumbar five;  Surgeon: Erline Levine, MD;  Location: [facility];  Service: Neurosurgery;  Laterality: N/A;  . SPINAL GROWTH RODS Bilateral 2018  . SYMPATHECTOMY     ? improve blood flow to legs  . TONSILECTOMY, ADENOIDECTOMY, BILATERAL MYRINGOTOMY AND TUBES      Home Medications:  Allergies as of 01/02/2020      Reactions   Atorvastatin Other (See Comments)   Chest pain     Rosuvastatin Other (See Comments)   Causes chest pain        Medication List       Accurate as of January 02, 2020 11:59 PM. If you have any questions, ask your nurse or doctor.        STOP taking these medications   sennosides-docusate sodium 8.6-50 MG tablet Commonly known as: SENOKOT-S Stopped by: Hollice Espy, MD   traZODone 50 MG tablet Commonly known as: DESYREL Stopped by: Hollice Espy, MD     TAKE these medications   amLODipine 2.5 MG tablet Commonly known as: NORVASC Take 2.5 mg by mouth every evening.   aspirin EC 81 MG tablet Take 81 mg by mouth at bedtime.   calcium citrate-vitamin D 315-200 MG-UNIT tablet Commonly known as: CITRACAL+D Take 1 tablet by mouth daily.   fluticasone 50 MCG/ACT nasal spray Commonly known as: FLONASE Place 2 sprays into both nostrils at bedtime as  needed for allergies.   furosemide 20 MG tablet Commonly known as: LASIX Take 20 mg by mouth daily as needed.   hydrochlorothiazide 25 MG tablet Commonly known as: HYDRODIURIL Take 1 tablet by mouth daily.   nystatin powder Generic drug: nystatin Apply 1 g topically daily as needed (skin irritation).   OCUVITE EXTRA PO Take 1 tablet by mouth daily.   potassium chloride SA 20 MEQ tablet Commonly known as: KLOR-CON Take 20 mEq by mouth every evening.   pravastatin 80 MG tablet Commonly known as: PRAVACHOL Take 80 mg by mouth at bedtime.   valsartan-hydrochlorothiazide 160-12.5 MG tablet Commonly known as: DIOVAN-HCT Take 1 tablet by mouth daily.       Allergies:  Allergies  Allergen Reactions  . Atorvastatin Other (See Comments)    Chest pain    . Rosuvastatin Other (See Comments)    Causes chest pain      Family History: Family History  Problem Relation Age of Onset  . Diabetes Mother   . Arthritis Mother   . Heart disease Mother   . Hypertension Mother   .  Diabetes Father   . Asthma Father   . Heart disease Father   . Hypertension Father   . Stroke Father   . Breast cancer Neg Hx     Social History:  reports that she has never smoked. She has never used smokeless tobacco. She reports that she does not drink alcohol and does not use drugs.   Physical Exam: BP (!) 150/74   Pulse 69   Ht 5\' 4"  (1.626 m)   Wt 175 lb (79.4 kg)   BMI 30.04 kg/m   Constitutional:  Alert and oriented, No acute distress. HEENT: [place] AT, moist mucus membranes.  Trachea midline, no masses. Cardiovascular: No clubbing, cyanosis, or edema. Respiratory: Normal respiratory effort, no increased work of breathing. Skin: No rashes, bruises or suspicious lesions. Neurologic: Grossly intact, no focal deficits, moving all 4 extremities. Psychiatric: Normal mood and affect.  Laboratory Data:  Urinalysis Negative, see epic  Pertinent Imaging: Results for orders placed or  performed in visit on 01/02/20  BLADDER SCAN AMB NON-IMAGING  Result Value Ref Range   Scan Result 22 ml      Assessment & Plan:    1. Urinary urgency/ urge incontinence Longstanding urinary urgency and occasional urge incontinence  Urinalysis is negative, no concern for underlying pathology at this point including no infections.  Adequate bladder emptying.  Lengthy discussion today about behavioral modification timed and double voiding.  Avoidance of irritating beverages.  We also discussed consideration of pharmacotherapy including pathway of treatment of OAB.  Given her age and comorbidities, I am hesitant to start her on anticholinergic thus we will start with Myrbetriq 25 mg.  She was given samples today for a month, we will either titrate up the dose or try alternative medication of this is not helpful.   -Given 4 Myrbetriq 25 mg daily, # 28 samples; I have advised the patient of the side effects of Myrbetriq, such as: elevation in BP, urinary retention and/or HA   2. Stress incontinence  We discussed behavioral modification and pelvic floor exercise.    F/u 4 weeks with PA to reassess  [facility] [address], [place], [place] [address] [phone]  I, Selena Kinzie, am acting as a scribe for Dr. Hollice Espy.  I have reviewed the above documentation for accuracy and completeness, and I agree with the above.   Hollice Espy, MD

## 2020-01-02 ENCOUNTER — Encounter: Payer: Self-pay | Admitting: Urology

## 2020-01-02 ENCOUNTER — Ambulatory Visit (INDEPENDENT_AMBULATORY_CARE_PROVIDER_SITE_OTHER): Payer: Medicare Other | Admitting: Urology

## 2020-01-02 ENCOUNTER — Other Ambulatory Visit: Payer: Self-pay

## 2020-01-02 VITALS — BP 150/74 | HR 69 | Ht 64.0 in | Wt 175.0 lb

## 2020-01-02 DIAGNOSIS — N3941 Urge incontinence: Secondary | ICD-10-CM | POA: Diagnosis not present

## 2020-01-02 DIAGNOSIS — I6523 Occlusion and stenosis of bilateral carotid arteries: Secondary | ICD-10-CM | POA: Diagnosis not present

## 2020-01-02 DIAGNOSIS — N393 Stress incontinence (female) (male): Secondary | ICD-10-CM

## 2020-01-02 LAB — BLADDER SCAN AMB NON-IMAGING: Scan Result: 22

## 2020-01-02 NOTE — Patient Instructions (Signed)
Pelvic Floor Muscle Exercises  More commonly called "Kegel" exercises: they can strengthen the muscles that hold urine inside the bladder.  Here's how you do them:   - Imagine that you are trying to control passing gas   - Pull in or tighten your pelvic muscles and hold for a count of 3 (you should feel a lifting sensation in the area around your vagina or pulling in your rectum)   - Repeat 10 to 15 times, at least 3 times a day   - Each time you do these exercises, alternate your position between lying, sitting and standing  Talk to you health care professional to make sure you are doing the exercise the right way.     Lie with hips and knees bent.  On an exhale, squeeze anal sphincter and hold for 3 seconds.  Release and fully relax pelvic floor.  Repeat 3 times.  Perform one set each hour during the day.  Also in this position, lie with hips and knees bent.  Slowly inhale, and then exhale.  On the exhale, contract anal sphincter, pull navel to spine and contract buttocks.  Hold for 5 seconds.  Repeat 5 times.  Perform one set each hour of the day in either lying, seated or standing.  While still laying lie with hips and knees bent. Slowly inhale and then exhale while pulling navel toward spine, holding 5 seconds.  Relax, repeat 5 times.  Perform one set each hour daytime in any position lying seated or standing.  Sitting on an exhale squeeze anal sphincter and hold for 3 seconds.  Release and fully relax pelvic floor.  Repeat 3 times.  Perform one set each hour daytime.  Standing on an exhale squeeze anal sphincter and hold for 3 seconds.  Release and fully relax pelvic floor.  Repeat 3 times.  Perform one set each hour daytime.  

## 2020-01-03 LAB — URINALYSIS, COMPLETE
Bilirubin, UA: NEGATIVE
Glucose, UA: NEGATIVE
Ketones, UA: NEGATIVE
Leukocytes,UA: NEGATIVE
Nitrite, UA: NEGATIVE
Protein,UA: NEGATIVE
RBC, UA: NEGATIVE
Specific Gravity, UA: 1.015 (ref 1.005–1.030)
Urobilinogen, Ur: 0.2 mg/dL (ref 0.2–1.0)
pH, UA: 7 (ref 5.0–7.5)

## 2020-01-03 LAB — MICROSCOPIC EXAMINATION: Bacteria, UA: NONE SEEN

## 2020-02-07 NOTE — Progress Notes (Signed)
01/02/2020 9:49 AM   Allison Mclean 09-17-1935 [id number]  Referring provider: Idelle Crouch, MD [facility] [place],  [place] [address] Chief Complaint  Patient presents with  . Urinary Incontinence    HPI: Allison Mclean is a 84 y.o. female who presents today for management of  urinary incontinence.   Today's PVR 1 mL.  Previous PVR:  22 mL.   At her visit with Dr. Erlene Quan on January 02, 2020  She is having some urgency, frequency and leakage. She has accidents on occasion. These symptoms have been ongoing since 2018 s/p back surgery. She goes through 2 pads per day. She has leakage with laughing, couhing ans sneezing. Denies any infections. She has nocturia x 2. Denies hematuria or dysuria. She has never tried any medication.  She reports that she is drinking mostly water and some juice.  Denies constipation. Denies any vaginal issues.  The patient has a personal history of cardiomyopathy, idiopathic and is on Lasix 20 mg as needed. Managed By Dr. Saralyn Pilar. She normally takes it with episodes of bilateral lower extremity swelling.   Patient's husband recently passed away.   No vaginal symptoms.  She took the Myrbetriq 25 mg at night and only noted a slowing of her urinary stream.  She continued to have urgency, frequency and leakage.  She also continued to have nocturia x2.  She also stated that she did not note any reduction in her incontinence episodes.  Patient denies any modifying or aggravating factors.  Patient denies any gross hematuria, dysuria or suprapubic/flank pain.  Patient denies any fevers, chills, nausea or vomiting.   She will be leaving in December for [facility] for a family wedding.  PMH: Past Medical History:  Diagnosis Date  . Acid reflux 11/22/2014  . 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 Chronic systolic heart failure ([place])   . GERD (gastroesophageal reflux disease)   . History of hiatal hernia    s/p  . History of kidney stones   .  History of lumbar fusion (12/19/2013) (posterior lumbar fusion from L2-L5) 07/02/2015   Pedicle screws and posterior hardware at L5 L4-L3 and L2. Neil disc block at the L4-5 disc space. Slight anterior subluxation of L4 on L5. Different configuration and this had L3-4. Degenerative changes of the lumbar disc spaces. L5-S1 disc space well-maintained .   Marland Kitchen Hyperlipidemia   . Hypertension   . Jackhammer esophagus   . Opiate use (10 MME/day) 07/02/2015  . Osteopenia, senile 07/02/2015  . Thoracic central spinal stenosis (T7-8, T8-9, T9-10, T10-11, T11-12, and T12-L1) 07/02/2015  . Thoracic facet syndrome (Left) 07/02/2015  . Thoracic foraminal stenosis (Moderate to severe at: Right T5; Bilateral T9; Left T11) 07/02/2015  . Thoracic spondylosis with radiculopathy (Left) 07/09/2015  . Wears dentures    partial lower    Surgical History: Past Surgical History:  Procedure Laterality Date  . ABDOMINAL EXPOSURE N/A 06/02/2016   Procedure: ABDOMINAL EXPOSURE;  Surgeon: Angelia Mould, MD;  Location: [facility];  Service: Vascular;  Laterality: N/A;  . ABDOMINAL HYSTERECTOMY    . ANTERIOR LAT LUMBAR FUSION N/A 06/02/2016   Procedure: L1-2 Left Anterior lateral lumbar interbody fusion;  Surgeon: Erline Levine, MD;  Location: [facility];  Service: Neurosurgery;  Laterality: N/A;  L1-2 Anterior lateral lumbar interbody fusion  . ANTERIOR LUMBAR FUSION N/A 06/02/2016   Procedure: Lumbar five -sacral one Anterior lumbar interbody fusion with Dr. Deitra Mayo; Lumbar one-two Left Anterior lateral lumbar interbody fusion;  Surgeon: Erline Levine, MD;  Location: [facility];  Service: Neurosurgery;  Laterality: N/A;  . APPENDECTOMY    . APPLICATION OF INTRAOPERATIVE CT SCAN N/A 06/05/2016   Procedure: APPLICATION OF INTRAOPERATIVE CT SCAN;  Surgeon: Erline Levine, MD;  Location: [facility];  Service: Neurosurgery;  Laterality: N/A;  . BACK SURGERY     2005, 2006, 2015  . CARDIAC CATHETERIZATION N/A 02/07/2015   Procedure: Left  Heart Cath and Coronary Angiography;  Surgeon: Teodoro Spray, MD;  Location: [facility];  Service: Cardiovascular;  Laterality: N/A;  . CHOLECYSTECTOMY    . ESOPHAGOGASTRODUODENOSCOPY (EGD) WITH PROPOFOL N/A 02/03/2016   Procedure: ESOPHAGOGASTRODUODENOSCOPY (EGD) WITH PROPOFOL;  Surgeon: Lucilla Lame, MD;  Location: [facility];  Service: Endoscopy;  Laterality: N/A;  . ESOPHAGOGASTRODUODENOSCOPY (EGD) WITH PROPOFOL N/A 03/02/2016   Procedure: ESOPHAGOGASTRODUODENOSCOPY (EGD) WITH PROPOFOL;  Surgeon: Lucilla Lame, MD;  Location: [facility];  Service: Endoscopy;  Laterality: N/A;  . ICD IMPLANT N/A 03/08/2018   [employer] Fortify Assura DR model ZY2482-50I (serial  Number [id number]) ICD by Dr Rayann Heman for treatment of syncope and reduced EF.  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 LEFT HEART CATH AND CORONARY ANGIOGRAPHY Left 12/21/2017   Procedure: LEFT HEART CATH AND CORONARY ANGIOGRAPHY;  Surgeon: Isaias Cowman, MD;  Location: [facility];  Service: Cardiovascular;  Laterality: Left;  . NISSEN FUNDOPLICATION    . POSTERIOR LUMBAR FUSION 4 LEVEL N/A 06/05/2016   Procedure: Thoracic six to pelvis fixation with Smith-Peterson Thoracic osteotomies at Thoracic ten-Thoracic Twelve with AIRO, Removal hardware lumbar two to lumbar five;  Surgeon: Erline Levine, MD;  Location: [facility];  Service: Neurosurgery;  Laterality: N/A;  . SPINAL GROWTH RODS Bilateral 2018  . SYMPATHECTOMY     ? improve blood flow to legs  . TONSILECTOMY, ADENOIDECTOMY, BILATERAL MYRINGOTOMY AND TUBES      Home Medications:  Allergies as of 02/08/2020      Reactions   Atorvastatin Other (See Comments)   Chest pain     Rosuvastatin Other (See Comments)   Causes chest pain        Medication List       Accurate as of February 08, 2020  9:49 AM. If you have any questions, ask your nurse or doctor.        amLODipine 2.5 MG tablet Commonly known as: NORVASC Take 2.5 mg by  mouth every evening.   aspirin EC 81 MG tablet Take 81 mg by mouth at bedtime.   calcium citrate-vitamin D 315-200 MG-UNIT tablet Commonly known as: CITRACAL+D Take 1 tablet by mouth daily.   fluticasone 50 MCG/ACT nasal spray Commonly known as: FLONASE Place 2 sprays into both nostrils at bedtime as needed for allergies.   furosemide 20 MG tablet Commonly known as: LASIX Take 20 mg by mouth daily as needed.   hydrochlorothiazide 25 MG tablet Commonly known as: HYDRODIURIL Take 1 tablet by mouth daily.   mirabegron ER 25 MG Tb24 tablet Commonly known as: MYRBETRIQ Take by mouth.   nystatin powder Generic drug: nystatin Apply 1 g topically daily as needed (skin irritation).   OCUVITE EXTRA PO Take 1 tablet by mouth daily.   potassium chloride SA 20 MEQ tablet Commonly known as: KLOR-CON Take 20 mEq by mouth every evening.   pravastatin 80 MG tablet Commonly known as: PRAVACHOL Take 80 mg by mouth at bedtime.   valsartan-hydrochlorothiazide 160-12.5 MG tablet Commonly known as: DIOVAN-HCT Take 1 tablet  by mouth daily.       Allergies:  Allergies  Allergen Reactions  . Atorvastatin Other (See Comments)    Chest pain    . Rosuvastatin Other (See Comments)    Causes chest pain      Family History: Family History  Problem Relation Age of Onset  . Diabetes Mother   . Arthritis Mother   . Heart disease Mother   . Hypertension Mother   . Diabetes Father   . Asthma Father   . Heart disease Father   . Hypertension Father   . Stroke Father   . Breast cancer Neg Hx     Social History:  reports that she has never smoked. She has never used smokeless tobacco. She reports that she does not drink alcohol and does not use drugs.   Physical Exam: BP (!) 163/76   Pulse 68   Ht 5' (1.524 m)   Wt 175 lb (79.4 kg)   BMI 34.18 kg/m   Constitutional:  Well nourished. Alert and oriented, No acute distress. HEENT:  AT, mask in place.  Trachea  midline Cardiovascular: No clubbing, cyanosis, or edema. Respiratory: Normal respiratory effort, no increased work of breathing. Neurologic: Grossly intact, no focal deficits, moving all 4 extremities. Psychiatric: Normal mood and affect.   Laboratory Data: Urinalysis Component     Latest Ref Rng & Units 01/02/2020  Specific Gravity, UA     1.005 - 1.030 1.015  pH, UA     5.0 - 7.5 7.0  Color, UA     Yellow Yellow  Appearance Ur     Clear Clear  Leukocytes,UA     Negative Negative  Protein,UA     Negative/Trace Negative  Glucose, UA     Negative Negative  Ketones, UA     Negative Negative  RBC, UA     Negative Negative  Bilirubin, UA     Negative Negative  Urobilinogen, Ur     0.2 - 1.0 mg/dL 0.2  Nitrite, UA     Negative Negative  Microscopic Examination      See below:   Component     Latest Ref Rng & Units 01/02/2020  WBC, UA     0 - 5 /hpf 0-5  RBC     0 - 2 /hpf 0-2  Epithelial Cells (non renal)     0 - 10 /hpf 0-10  Renal Epithel, UA     None seen /hpf 0-10 (A)  Casts     None seen /lpf Present (A)  Cast Type     N/A Hyaline casts  Bacteria, UA     None seen/Few None seen   Specimen:  Blood  Ref Range & Units 4 wk ago  Glucose 70 - 110 mg/dL 97      Sodium 136 - 145 mmol/L 135Low      Potassium 3.6 - 5.1 mmol/L 4.3      Chloride 97 - 109 mmol/L 96Low      Carbon Dioxide (CO2) 22.0 - 32.0 mmol/L 36.4High      Urea Nitrogen (BUN) 7 - 25 mg/dL 10      Creatinine 0.6 - 1.1 mg/dL 0.8      Glomerular Filtration Rate (eGFR), MDRD Estimate >60 mL/min/1.73sq m 69      Calcium 8.7 - 10.3 mg/dL 10.7High      AST  8 - 39 U/L 19      ALT  5 - 38 U/L 13  Alk Phos (alkaline Phosphatase) 34 - 104 U/L 78      Albumin 3.5 - 4.8 g/dL 4.3      Bilirubin, Total 0.3 - 1.2 mg/dL 0.5      Protein, Total 6.1 - 7.9 g/dL 6.9      A/G Ratio 1.0 - 5.0 gm/dL 1.7      Resulting Agency  [facility]  Specimen Collected: 01/10/20  8:52 AM Last Resulted: 01/10/20 11:33 AM  Received From: [facility]  Result Received: 01/19/20 9:05 PM   Specimen:  Blood  Ref Range & Units 4 wk ago  WBC (White Blood Cell Count) 4.1 - 10.2 10^3/uL 5.4      RBC (Red Blood Cell Count) 4.04 - 5.48 10^6/uL 4.83      Hemoglobin 12.0 - 15.0 gm/dL 14.9      Hematocrit 35.0 - 47.0 % 45.5      MCV (Mean Corpuscular Volume) 80.0 - 100.0 fl 94.2      MCH (Mean Corpuscular Hemoglobin) 27.0 - 31.2 pg 30.8      MCHC (Mean Corpuscular Hemoglobin Concentration) 32.0 - 36.0 gm/dL 32.7      Platelet Count 150 - 450 10^3/uL 211      RDW-CV (Red Cell Distribution Width) 11.6 - 14.8 % 12.7      MPV (Mean Platelet Volume) 9.4 - 12.4 fl 9.3Low      Neutrophils 1.50 - 7.80 10^3/uL 3.06      Lymphocytes 1.00 - 3.60 10^3/uL 1.66      Monocytes 0.00 - 1.50 10^3/uL 0.48      Eosinophils 0.00 - 0.55 10^3/uL 0.11      Basophils 0.00 - 0.09 10^3/uL 0.06      Neutrophil % 32.0 - 70.0 % 56.9      Lymphocyte % 10.0 - 50.0 % 30.9      Monocyte % 4.0 - 13.0 % 8.9      Eosinophil % 1.0 - 5.0 % 2.0      Basophil% 0.0 - 2.0 % 1.1      Immature Granulocyte % <=0.7 % 0.2      Immature Granulocyte Count <=0.06 10^3/L 0.01      Resulting Agency  [facility]  Specimen Collected: 01/10/20 8:52 AM Last Resulted: 01/10/20 9:02 AM  Received From: [facility]  Result Received: 01/19/20 9:05 PM   Specimen:  Blood  Ref Range & Units 4 wk ago  Hemoglobin A1C 4.2 - 5.6 % 5.9High      Average Blood Glucose (Calc) mg/dL [facility]  Narrative Performed by [facility] Normal Range:  4.2 - 5.6%  Increased Risk: 5.7 - 6.4%  Diabetes:    >= 6.5%  Glycemic Control for adults with diabetes: <7%  Specimen Collected: 01/10/20 8:52 AM Last Resulted: 01/10/20 9:28 AM  Received From: [facility]  Result Received:  01/19/20 9:05 PM     Pertinent Imaging: Results for BREAUNNA, GOTTLIEB (MRN [id number]) as of 02/08/2020 10:12  Ref. Range 02/08/2020 09:28  Scan Result Unknown 1 ml    Assessment & Plan:    1. Urinary urgency/ urge incontinence She did not find the Myrbetriq 25 mg daily effective in reaching her goals We discussed PTNS therapy. explained the PTNS provides treatment by indirectly providing electrical stimulation to the nerves responsible for bladder and pelvic floor function - a  needle electrode generates an adjustable electrical pulse that travels to the sacral plexus via the tibial nerve which is located in the ankle, among other functions, the sacral nerve plexus regulates bladder and pelvic floor function - treatment protocol requires once-a-week treatments for 12 weeks, 30 minutes per session and many patients begin to see improvements by the 6th treatment. Patients who respond to treatment may require occasional treatments (~ once every 3 weeks) to sustain improvements. PTNS is a low-risk procedure. The most common side-effects with PTNS treatment are temporary and minor, resulting from the placement of the needle electrode. They include minor bleeding, mild pain and skin inflammation and patients have seen up to an 80% success rate with this form of treatment -she stated she would like to consider this but it would have to be after the first of the year due to her upcoming travel plans In the interim, she would like to have a trial of Gemtesa 75 mg daily.  I did discuss with her that this is a brand only medication and it may likely be cost prohibitive.  She states that she would like to try the samples and if found effective we may try to get it covered either through a letter of medical necessity or prior authorizations Samples given of Gemtesa 75 mg daily, #28 She will return to the clinic in 3 to 4 weeks for PVR and symptoms recheck   2. Stress incontinence  We discussed behavioral  modification and pelvic floor exercise.    [facility] [address], [place], [place] [address] [phone]   Kyaira Trantham, PA-C  I spent 20 minutes on the day of the encounter to include pre-visit record review, face-to-face time with the patient, and post-visit ordering of tests.

## 2020-02-08 ENCOUNTER — Ambulatory Visit (INDEPENDENT_AMBULATORY_CARE_PROVIDER_SITE_OTHER): Payer: Medicare Other | Admitting: Urology

## 2020-02-08 ENCOUNTER — Other Ambulatory Visit: Payer: Self-pay

## 2020-02-08 ENCOUNTER — Encounter: Payer: Self-pay | Admitting: Urology

## 2020-02-08 VITALS — BP 163/76 | HR 68 | Ht 60.0 in | Wt 175.0 lb

## 2020-02-08 DIAGNOSIS — I6523 Occlusion and stenosis of bilateral carotid arteries: Secondary | ICD-10-CM

## 2020-02-08 DIAGNOSIS — N3941 Urge incontinence: Secondary | ICD-10-CM

## 2020-02-08 DIAGNOSIS — N393 Stress incontinence (female) (male): Secondary | ICD-10-CM | POA: Diagnosis not present

## 2020-02-08 LAB — BLADDER SCAN AMB NON-IMAGING: Scan Result: 1

## 2020-02-08 MED ORDER — GEMTESA 75 MG PO TABS: 75.0000 mg | ORAL_TABLET | Freq: Every day | ORAL | 0 refills | Status: DC

## 2020-03-04 NOTE — Progress Notes (Signed)
01/02/2020 10:36 AM   Allison Mclean 06-Apr-1936 [id number]  Referring provider: Idelle Crouch, MD [facility] [place],  [place] [address] Chief Complaint  Patient presents with  . Urinary Incontinence    HPI: Allison Mclean is a 84 y.o. female who presents today for management of  urinary incontinence.   Today's PVR 14 mL.  Previous PVR:  1 mL.   At her visit with Dr. Erlene Quan on January 02, 2020  She is having some urgency, frequency and leakage. She has accidents on occasion. These symptoms have been ongoing since 2018 s/p back surgery. She goes through 2 pads per day. She has leakage with laughing, couhing ans sneezing. Denies any infections. She has nocturia x 2. Denies hematuria or dysuria. She has never tried any medication.  She reports that she is drinking mostly water and some juice.  Denies constipation. Denies any vaginal issues.  The patient has a personal history of cardiomyopathy, idiopathic and is on Lasix 20 mg as needed. Managed By Dr. Saralyn Pilar. She normally takes it with episodes of bilateral lower extremity swelling.   Patient's husband recently passed away.   No vaginal symptoms.  She took the Myrbetriq 25 mg at night and only noted a slowing of her urinary stream.  She continued to have urgency, frequency and leakage.  She also continued to have nocturia x2.  She also stated that she did not note any reduction in her incontinence episodes.  She will be leaving in December for [facility] for a family wedding.  She was given Gemtesa samples at her last visit.  She feels the [place] worked better than [employer].  She is still having the episodes of incontinence, but she states it is less than with the Myrbetriq.  Patient denies any modifying or aggravating factors.  Patient denies any gross hematuria, dysuria or suprapubic/flank pain.  Patient denies any fevers, chills, nausea or vomiting.   PMH: Past Medical History:  Diagnosis Date  .  Acid reflux 11/22/2014  . 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 Chronic systolic heart failure ([place])   . GERD (gastroesophageal reflux disease)   . History of hiatal hernia    s/p  . History of kidney stones   . History of lumbar fusion (12/19/2013) (posterior lumbar fusion from L2-L5) 07/02/2015   Pedicle screws and posterior hardware at L5 L4-L3 and L2. Neil disc block at the L4-5 disc space. Slight anterior subluxation of L4 on L5. Different configuration and this had L3-4. Degenerative changes of the lumbar disc spaces. L5-S1 disc space well-maintained .   Marland Kitchen Hyperlipidemia   . Hypertension   . Jackhammer esophagus   . Opiate use (10 MME/day) 07/02/2015  . Osteopenia, senile 07/02/2015  . Thoracic central spinal stenosis (T7-8, T8-9, T9-10, T10-11, T11-12, and T12-L1) 07/02/2015  . Thoracic facet syndrome (Left) 07/02/2015  . Thoracic foraminal stenosis (Moderate to severe at: Right T5; Bilateral T9; Left T11) 07/02/2015  . Thoracic spondylosis with radiculopathy (Left) 07/09/2015  . Wears dentures    partial lower    Surgical History: Past Surgical History:  Procedure Laterality Date  . ABDOMINAL EXPOSURE N/A 06/02/2016   Procedure: ABDOMINAL EXPOSURE;  Surgeon: Angelia Mould, MD;  Location: [place];  Service: Vascular;  Laterality: N/A;  . ABDOMINAL HYSTERECTOMY    . ANTERIOR LAT LUMBAR FUSION N/A 06/02/2016   Procedure: L1-2 Left Anterior lateral lumbar interbody fusion;  Surgeon: Erline Levine, MD;  Location: [place];  Service: Neurosurgery;  Laterality: N/A;  L1-2 Anterior lateral lumbar interbody fusion  . ANTERIOR LUMBAR FUSION N/A 06/02/2016   Procedure: Lumbar five -sacral one Anterior lumbar interbody fusion with Dr. Deitra Mayo; Lumbar one-two Left Anterior lateral lumbar interbody fusion;  Surgeon: Erline Levine, MD;  Location: [place];  Service: Neurosurgery;  Laterality: N/A;  . APPENDECTOMY    . APPLICATION OF INTRAOPERATIVE CT SCAN N/A 06/05/2016   Procedure: APPLICATION OF  INTRAOPERATIVE CT SCAN;  Surgeon: Erline Levine, MD;  Location: [place];  Service: Neurosurgery;  Laterality: N/A;  . BACK SURGERY     2005, 2006, 2015  . CARDIAC CATHETERIZATION N/A 02/07/2015   Procedure: Left Heart Cath and Coronary Angiography;  Surgeon: Teodoro Spray, MD;  Location: [place] [facility];  Service: Cardiovascular;  Laterality: N/A;  . CHOLECYSTECTOMY    . ESOPHAGOGASTRODUODENOSCOPY (EGD) WITH PROPOFOL N/A 02/03/2016   Procedure: ESOPHAGOGASTRODUODENOSCOPY (EGD) WITH PROPOFOL;  Surgeon: Lucilla Lame, MD;  Location: [place];  Service: Endoscopy;  Laterality: N/A;  . ESOPHAGOGASTRODUODENOSCOPY (EGD) WITH PROPOFOL N/A 03/02/2016   Procedure: ESOPHAGOGASTRODUODENOSCOPY (EGD) WITH PROPOFOL;  Surgeon: Lucilla Lame, MD;  Location: [place];  Service: Endoscopy;  Laterality: N/A;  . ICD IMPLANT N/A 03/08/2018   St. Jude Medical Fortify [employer] DR model [id number] (serial  Number [id number]) ICD by Dr Rayann Heman for treatment of syncope and reduced EF.  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 LEFT HEART CATH AND CORONARY ANGIOGRAPHY Left 12/21/2017   Procedure: LEFT HEART CATH AND CORONARY ANGIOGRAPHY;  Surgeon: Isaias Cowman, MD;  Location: [facility];  Service: Cardiovascular;  Laterality: Left;  . NISSEN FUNDOPLICATION    . POSTERIOR LUMBAR FUSION 4 LEVEL N/A 06/05/2016   Procedure: Thoracic six to pelvis fixation with Smith-Peterson Thoracic osteotomies at Thoracic ten-Thoracic Twelve with AIRO, Removal hardware lumbar two to lumbar five;  Surgeon: Erline Levine, MD;  Location: [place];  Service: Neurosurgery;  Laterality: N/A;  . SPINAL GROWTH RODS Bilateral 2018  . SYMPATHECTOMY     ? improve blood flow to legs  . TONSILECTOMY, ADENOIDECTOMY, BILATERAL MYRINGOTOMY AND TUBES      Home Medications:  Allergies as of 03/05/2020      Reactions   Atorvastatin Other (See Comments)   Chest pain     Rosuvastatin Other (See Comments)   Causes chest pain         Medication List       Accurate as of March 05, 2020 10:36 AM. If you have any questions, ask your nurse or doctor.        amLODipine 2.5 MG tablet Commonly known as: NORVASC Take 2.5 mg by mouth every evening.   aspirin EC 81 MG tablet Take 81 mg by mouth at bedtime.   calcium citrate-vitamin D 315-200 MG-UNIT tablet Commonly known as: CITRACAL+D Take 1 tablet by mouth daily.   fluticasone 50 MCG/ACT nasal spray Commonly known as: FLONASE Place 2 sprays into both nostrils at bedtime as needed for allergies.   furosemide 20 MG tablet Commonly known as: LASIX Take 20 mg by mouth daily as needed.   Gemtesa 75 MG Tabs Generic drug: Vibegron Take 75 mg by mouth daily.   hydrochlorothiazide 25 MG tablet Commonly known as: HYDRODIURIL Take 1 tablet by mouth daily.   nystatin powder Generic drug: nystatin Apply 1 g topically daily as needed (skin irritation).   OCUVITE EXTRA PO Take 1 tablet by mouth daily.   potassium chloride SA 20 MEQ tablet Commonly known  as: KLOR-CON Take 20 mEq by mouth every evening.   pravastatin 80 MG tablet Commonly known as: PRAVACHOL Take 80 mg by mouth at bedtime.   valsartan-hydrochlorothiazide 160-12.5 MG tablet Commonly known as: DIOVAN-HCT Take 1 tablet by mouth daily.       Allergies:  Allergies  Allergen Reactions  . Atorvastatin Other (See Comments)    Chest pain    . Rosuvastatin Other (See Comments)    Causes chest pain      Family History: Family History  Problem Relation Age of Onset  . Diabetes Mother   . Arthritis Mother   . Heart disease Mother   . Hypertension Mother   . Diabetes Father   . Asthma Father   . Heart disease Father   . Hypertension Father   . Stroke Father   . Breast cancer Neg Hx     Social History:  reports that she has never smoked. She has never used smokeless tobacco. She reports that she does not drink alcohol and does not use drugs.   Physical Exam: BP 128/72    Pulse 82   Ht 5' (1.524 m)   Wt 175 lb (79.4 kg)   BMI 34.18 kg/m   Constitutional:  Well nourished. Alert and oriented, No acute distress. HEENT: [place] AT, mask in place.  Trachea midline Cardiovascular: No clubbing, cyanosis, or edema. Respiratory: Normal respiratory effort, no increased work of breathing. Neurologic: Grossly intact, no focal deficits, moving all 4 extremities. Psychiatric: Normal mood and affect.   Laboratory Data: Urinalysis Component     Latest Ref Rng & Units 01/02/2020  Specific Gravity, UA     1.005 - 1.030 1.015  pH, UA     5.0 - 7.5 7.0  Color, UA     Yellow Yellow  Appearance Ur     Clear Clear  Leukocytes,UA     Negative Negative  Protein,UA     Negative/Trace Negative  Glucose, UA     Negative Negative  Ketones, UA     Negative Negative  RBC, UA     Negative Negative  Bilirubin, UA     Negative Negative  Urobilinogen, Ur     0.2 - 1.0 mg/dL 0.2  Nitrite, UA     Negative Negative  Microscopic Examination      See below:   Component     Latest Ref Rng & Units 01/02/2020  WBC, UA     0 - 5 /hpf 0-5  RBC     0 - 2 /hpf 0-2  Epithelial Cells (non renal)     0 - 10 /hpf 0-10  Renal Epithel, UA     None seen /hpf 0-10 (A)  Casts     None seen /lpf Present (A)  Cast Type     N/A Hyaline casts  Bacteria, UA     None seen/Few None seen   Specimen:  Blood  Ref Range & Units 4 wk ago  Glucose 70 - 110 mg/dL 97      Sodium 136 - 145 mmol/L 135Low      Potassium 3.6 - 5.1 mmol/L 4.3      Chloride 97 - 109 mmol/L 96Low      Carbon Dioxide (CO2) 22.0 - 32.0 mmol/L 36.4High      Urea Nitrogen (BUN) 7 - 25 mg/dL 10      Creatinine 0.6 - 1.1 mg/dL 0.8      Glomerular Filtration Rate (eGFR), MDRD Estimate >60 mL/min/1.73sq m 69  Calcium 8.7 - 10.3 mg/dL 10.7High      AST  8 - 39 U/L 19      ALT  5 - 38 U/L 13      Alk Phos (alkaline Phosphatase) 34 - 104 U/L 78      Albumin 3.5 - 4.8 g/dL 4.3      Bilirubin,  Total 0.3 - 1.2 mg/dL 0.5      Protein, Total 6.1 - 7.9 g/dL 6.9      A/G Ratio 1.0 - 5.0 gm/dL 1.7      Resulting Agency  [facility]  Specimen Collected: 01/10/20 8:52 AM Last Resulted: 01/10/20 11:33 AM  Received From: [facility]  Result Received: 01/19/20 9:05 PM   Specimen:  Blood  Ref Range & Units 4 wk ago  WBC (White Blood Cell Count) 4.1 - 10.2 10^3/uL 5.4      RBC (Red Blood Cell Count) 4.04 - 5.48 10^6/uL 4.83      Hemoglobin 12.0 - 15.0 gm/dL 14.9      Hematocrit 35.0 - 47.0 % 45.5      MCV (Mean Corpuscular Volume) 80.0 - 100.0 fl 94.2      MCH (Mean Corpuscular Hemoglobin) 27.0 - 31.2 pg 30.8      MCHC (Mean Corpuscular Hemoglobin Concentration) 32.0 - 36.0 gm/dL 32.7      Platelet Count 150 - 450 10^3/uL 211      RDW-CV (Red Cell Distribution Width) 11.6 - 14.8 % 12.7      MPV (Mean Platelet Volume) 9.4 - 12.4 fl 9.3Low      Neutrophils 1.50 - 7.80 10^3/uL 3.06      Lymphocytes 1.00 - 3.60 10^3/uL 1.66      Monocytes 0.00 - 1.50 10^3/uL 0.48      Eosinophils 0.00 - 0.55 10^3/uL 0.11      Basophils 0.00 - 0.09 10^3/uL 0.06      Neutrophil % 32.0 - 70.0 % 56.9      Lymphocyte % 10.0 - 50.0 % 30.9      Monocyte % 4.0 - 13.0 % 8.9      Eosinophil % 1.0 - 5.0 % 2.0      Basophil% 0.0 - 2.0 % 1.1      Immature Granulocyte % <=0.7 % 0.2      Immature Granulocyte Count <=0.06 10^3/L 0.01      Resulting Agency  [facility]  Specimen Collected: 01/10/20 8:52 AM Last Resulted: 01/10/20 9:02 AM  Received From: [facility]  Result Received: 01/19/20 9:05 PM   Specimen:  Blood  Ref Range & Units 4 wk ago  Hemoglobin A1C 4.2 - 5.6 % 5.9High      Average Blood Glucose (Calc) mg/dL [facility]  Narrative Performed by [facility] - [facility] Normal Range:  4.2 - 5.6%  Increased Risk: 5.7 - 6.4%  Diabetes:    >= 6.5%    Glycemic Control for adults with diabetes: <7%  Specimen Collected: 01/10/20 8:52 AM Last Resulted: 01/10/20 9:28 AM  Received From: [facility]  Result Received: 01/19/20 9:05 PM     Pertinent Imaging: Results for MAIRIN, LINDSLEY (MRN [id number]) as of 03/05/2020 10:38  Ref. Range 03/05/2020 10:20  Scan Result Unknown 14     Assessment & Plan:    1. Urinary urgency/ urge incontinence She did not find  the Myrbetriq 25 mg daily effective in reaching her goals She is not interested in PTNS She would like to continue the Purdin, so I have sent a script to her pharmacy She will call tomorrow regarding the prescription coverage   2. Stress incontinence  We discussed behavioral modification and pelvic floor exercise.    [facility] [address] [place], [place] [address] [phone]   Zara Council, PA-C

## 2020-03-05 ENCOUNTER — Other Ambulatory Visit: Payer: Self-pay

## 2020-03-05 ENCOUNTER — Ambulatory Visit (INDEPENDENT_AMBULATORY_CARE_PROVIDER_SITE_OTHER): Payer: Medicare Other | Admitting: Urology

## 2020-03-05 ENCOUNTER — Encounter: Payer: Self-pay | Admitting: Urology

## 2020-03-05 VITALS — BP 128/72 | HR 82 | Ht 60.0 in | Wt 175.0 lb

## 2020-03-05 DIAGNOSIS — N3941 Urge incontinence: Secondary | ICD-10-CM

## 2020-03-05 DIAGNOSIS — I6523 Occlusion and stenosis of bilateral carotid arteries: Secondary | ICD-10-CM | POA: Diagnosis not present

## 2020-03-05 LAB — BLADDER SCAN AMB NON-IMAGING: Scan Result: 14

## 2020-03-05 MED ORDER — GEMTESA 75 MG PO TABS: 75.0000 mg | ORAL_TABLET | Freq: Every day | ORAL | 3 refills | Status: DC

## 2020-03-06 ENCOUNTER — Telehealth: Payer: Self-pay | Admitting: Urology

## 2020-03-06 NOTE — Telephone Encounter (Signed)
Patient called the office this afternoon with a report that the pharmacy told her that the insurance is requiring a prior authorization for the medication Gemtesa.    Please let her know when she can pick this medication up from [facility] in [place].

## 2020-03-08 NOTE — Telephone Encounter (Signed)
PA has been sent to plan. Waiting on response.

## 2020-03-08 NOTE — Telephone Encounter (Signed)
Patient's insurance has denied [place] Is there another medication you would like her to try?

## 2020-03-15 ENCOUNTER — Ambulatory Visit (INDEPENDENT_AMBULATORY_CARE_PROVIDER_SITE_OTHER): Payer: Medicare Other

## 2020-03-15 DIAGNOSIS — I428 Other cardiomyopathies: Secondary | ICD-10-CM

## 2020-03-16 LAB — CUP PACEART REMOTE DEVICE CHECK
Battery Remaining Longevity: 82 mo
Battery Remaining Percentage: 78 %
Battery Voltage: 2.99 V
Brady Statistic AP VP Percent: 1 %
Brady Statistic AP VS Percent: 27 %
Brady Statistic AS VP Percent: 1 %
Brady Statistic AS VS Percent: 70 %
Brady Statistic RA Percent Paced: 25 %
Brady Statistic RV Percent Paced: 1 %
Date Time Interrogation Session: 20211210025046
HighPow Impedance: 81 Ohm
HighPow Impedance: 81 Ohm
Implantable Lead Implant Date: 20191203
Implantable Lead Implant Date: 20191203
Implantable Lead Location: 753859
Implantable Lead Location: 753860
Implantable Pulse Generator Implant Date: 20191203
Lead Channel Impedance Value: 510 Ohm
Lead Channel Impedance Value: 690 Ohm
Lead Channel Pacing Threshold Amplitude: 0.5 V
Lead Channel Pacing Threshold Amplitude: 0.5 V
Lead Channel Pacing Threshold Pulse Width: 0.5 ms
Lead Channel Pacing Threshold Pulse Width: 0.5 ms
Lead Channel Sensing Intrinsic Amplitude: 1.3 mV
Lead Channel Sensing Intrinsic Amplitude: 5.6 mV
Lead Channel Setting Pacing Amplitude: 0.75 V
Lead Channel Setting Pacing Amplitude: 2 V
Lead Channel Setting Pacing Pulse Width: 0.5 ms
Lead Channel Setting Sensing Sensitivity: 0.5 mV
Pulse Gen Serial Number: 9854527

## 2020-03-27 NOTE — Progress Notes (Signed)
Remote ICD transmission.   

## 2020-04-15 ENCOUNTER — Other Ambulatory Visit: Payer: Self-pay | Admitting: Internal Medicine

## 2020-04-15 DIAGNOSIS — Z1231 Encounter for screening mammogram for malignant neoplasm of breast: Secondary | ICD-10-CM

## 2020-04-22 NOTE — Telephone Encounter (Signed)
Where we able to get her approved for PTNS therapy?  If yes, we will need cardiac clearance before we start as she has a pacemaker.

## 2020-04-23 NOTE — Telephone Encounter (Signed)
Talked with patient and patient states she dont want to be scheduled for ptns

## 2020-05-03 ENCOUNTER — Other Ambulatory Visit: Payer: Self-pay | Admitting: Internal Medicine

## 2020-05-03 ENCOUNTER — Other Ambulatory Visit (HOSPITAL_COMMUNITY): Payer: Self-pay | Admitting: Internal Medicine

## 2020-05-03 DIAGNOSIS — R270 Ataxia, unspecified: Secondary | ICD-10-CM

## 2020-05-16 ENCOUNTER — Ambulatory Visit
Admission: RE | Admit: 2020-05-16 | Discharge: 2020-05-16 | Disposition: A | Discharge status: Home / Self Care | Payer: Medicare Other | Source: Ambulatory Visit | Attending: Internal Medicine | Admitting: Internal Medicine

## 2020-05-16 ENCOUNTER — Other Ambulatory Visit: Payer: Self-pay

## 2020-05-16 DIAGNOSIS — R270 Ataxia, unspecified: Secondary | ICD-10-CM | POA: Insufficient documentation

## 2020-05-23 ENCOUNTER — Ambulatory Visit
Admission: RE | Admit: 2020-05-23 | Discharge: 2020-05-23 | Disposition: A | Discharge status: Home / Self Care | Payer: Medicare Other | Source: Ambulatory Visit | Attending: Internal Medicine | Admitting: Internal Medicine

## 2020-05-23 ENCOUNTER — Other Ambulatory Visit: Payer: Self-pay

## 2020-05-23 DIAGNOSIS — Z1231 Encounter for screening mammogram for malignant neoplasm of breast: Secondary | ICD-10-CM | POA: Insufficient documentation

## 2020-06-13 ENCOUNTER — Telehealth: Payer: Self-pay | Admitting: Internal Medicine

## 2020-06-13 NOTE — Telephone Encounter (Signed)
[facility] preadmission called in and stated they rec'd the clearance info but did not rec the pacemaker info.  There was a form fax over with 11 questions that are needing back.  She Is re faxing it to [phone].     Best number [phone]

## 2020-06-14 ENCOUNTER — Ambulatory Visit (INDEPENDENT_AMBULATORY_CARE_PROVIDER_SITE_OTHER): Payer: Medicare Other

## 2020-06-14 DIAGNOSIS — I428 Other cardiomyopathies: Secondary | ICD-10-CM

## 2020-06-17 LAB — CUP PACEART REMOTE DEVICE CHECK
Battery Remaining Longevity: 80 mo
Battery Remaining Percentage: 77 %
Battery Voltage: 2.98 V
Brady Statistic AP VP Percent: 1 %
Brady Statistic AP VS Percent: 26 %
Brady Statistic AS VP Percent: 1 %
Brady Statistic AS VS Percent: 72 %
Brady Statistic RA Percent Paced: 24 %
Brady Statistic RV Percent Paced: 1 %
Date Time Interrogation Session: 20220311072232
HighPow Impedance: 84 Ohm
HighPow Impedance: 84 Ohm
Implantable Lead Implant Date: 20191203
Implantable Lead Implant Date: 20191203
Implantable Lead Location: 753859
Implantable Lead Location: 753860
Implantable Pulse Generator Implant Date: 20191203
Lead Channel Impedance Value: 510 Ohm
Lead Channel Impedance Value: 690 Ohm
Lead Channel Pacing Threshold Amplitude: 0.5 V
Lead Channel Pacing Threshold Amplitude: 0.5 V
Lead Channel Pacing Threshold Pulse Width: 0.5 ms
Lead Channel Pacing Threshold Pulse Width: 0.5 ms
Lead Channel Sensing Intrinsic Amplitude: 0.8 mV
Lead Channel Sensing Intrinsic Amplitude: 4.1 mV
Lead Channel Setting Pacing Amplitude: 0.75 V
Lead Channel Setting Pacing Amplitude: 2 V
Lead Channel Setting Pacing Pulse Width: 0.5 ms
Lead Channel Setting Sensing Sensitivity: 0.5 mV
Pulse Gen Serial Number: 9854527

## 2020-06-21 ENCOUNTER — Encounter: Payer: Medicare Other | Admitting: Internal Medicine

## 2020-06-21 NOTE — Progress Notes (Signed)
Remote ICD transmission.   

## 2020-07-15 ENCOUNTER — Other Ambulatory Visit: Payer: Self-pay

## 2020-07-15 ENCOUNTER — Encounter: Payer: Self-pay | Admitting: Internal Medicine

## 2020-07-15 ENCOUNTER — Ambulatory Visit (INDEPENDENT_AMBULATORY_CARE_PROVIDER_SITE_OTHER): Payer: Medicare Other | Admitting: Internal Medicine

## 2020-07-15 VITALS — BP 146/70 | HR 78 | Ht 60.0 in | Wt 178.2 lb

## 2020-07-15 DIAGNOSIS — I472 Ventricular tachycardia: Secondary | ICD-10-CM

## 2020-07-15 DIAGNOSIS — I428 Other cardiomyopathies: Secondary | ICD-10-CM

## 2020-07-15 DIAGNOSIS — I519 Heart disease, unspecified: Secondary | ICD-10-CM

## 2020-07-15 DIAGNOSIS — I4729 Other ventricular tachycardia: Secondary | ICD-10-CM

## 2020-07-15 NOTE — Progress Notes (Deleted)
PCP: Idelle Crouch, MD Primary Cardiologist: Dr Saralyn Pilar Primary EP: Dr Rayann Heman  Allison Mclean is a 85 y.o. female who presents today for routine electrophysiology followup.  Since last being seen in our clinic, the patient reports doing very well.  Today, she denies symptoms of palpitations, chest pain, shortness of breath,  lower extremity edema, dizziness, presyncope, syncope, or ICD shocks.  The patient is otherwise without complaint today.   Past Medical History:  Diagnosis Date  . Acid reflux 11/22/2014  . 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 Chronic systolic heart failure ([place])   . GERD (gastroesophageal reflux disease)   . History of hiatal hernia    s/p  . History of kidney stones   . History of lumbar fusion (12/19/2013) (posterior lumbar fusion from L2-L5) 07/02/2015   Pedicle screws and posterior hardware at L5 L4-L3 and L2. Neil disc block at the L4-5 disc space. Slight anterior subluxation of L4 on L5. Different configuration and this had L3-4. Degenerative changes of the lumbar disc spaces. L5-S1 disc space well-maintained .   Marland Kitchen Hyperlipidemia   . Hypertension   . Jackhammer esophagus   . Opiate use (10 MME/day) 07/02/2015  . Osteopenia, senile 07/02/2015  . Thoracic central spinal stenosis (T7-8, T8-9, T9-10, T10-11, T11-12, and T12-L1) 07/02/2015  . Thoracic facet syndrome (Left) 07/02/2015  . Thoracic foraminal stenosis (Moderate to severe at: Right T5; Bilateral T9; Left T11) 07/02/2015  . Thoracic spondylosis with radiculopathy (Left) 07/09/2015  . Wears dentures    partial lower   Past Surgical History:  Procedure Laterality Date  . ABDOMINAL EXPOSURE N/A 06/02/2016   Procedure: ABDOMINAL EXPOSURE;  Surgeon: Angelia Mould, MD;  Location: [facility];  Service: Vascular;  Laterality: N/A;  . ABDOMINAL HYSTERECTOMY    . ANTERIOR LAT LUMBAR FUSION N/A 06/02/2016   Procedure: L1-2 Left Anterior lateral lumbar interbody fusion;  Surgeon: Erline Levine, MD;  Location: [facility] [facility];   Service: Neurosurgery;  Laterality: N/A;  L1-2 Anterior lateral lumbar interbody fusion  . ANTERIOR LUMBAR FUSION N/A 06/02/2016   Procedure: Lumbar five -sacral one Anterior lumbar interbody fusion with Dr. Deitra Mayo; Lumbar one-two Left Anterior lateral lumbar interbody fusion;  Surgeon: Erline Levine, MD;  Location: [facility];  Service: Neurosurgery;  Laterality: N/A;  . APPENDECTOMY    . APPLICATION OF INTRAOPERATIVE CT SCAN N/A 06/05/2016   Procedure: APPLICATION OF INTRAOPERATIVE CT SCAN;  Surgeon: Erline Levine, MD;  Location: [facility];  Service: Neurosurgery;  Laterality: N/A;  . BACK SURGERY     2005, 2006, 2015  . CARDIAC CATHETERIZATION N/A 02/07/2015   Procedure: Left Heart Cath and Coronary Angiography;  Surgeon: Teodoro Spray, MD;  Location: [facility];  Service: Cardiovascular;  Laterality: N/A;  . CHOLECYSTECTOMY    . ESOPHAGOGASTRODUODENOSCOPY (EGD) WITH PROPOFOL N/A 02/03/2016   Procedure: ESOPHAGOGASTRODUODENOSCOPY (EGD) WITH PROPOFOL;  Surgeon: Lucilla Lame, MD;  Location: [facility];  Service: Endoscopy;  Laterality: N/A;  . ESOPHAGOGASTRODUODENOSCOPY (EGD) WITH PROPOFOL N/A 03/02/2016   Procedure: ESOPHAGOGASTRODUODENOSCOPY (EGD) WITH PROPOFOL;  Surgeon: Lucilla Lame, MD;  Location: [place];  Service: Endoscopy;  Laterality: N/A;  . ICD IMPLANT N/A 03/08/2018   St. Jude Medical Fortify Assura DR model [id number] (serial  Number [id number]) ICD by Dr Rayann Heman for treatment of syncope and reduced EF.  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 LEFT HEART CATH AND CORONARY ANGIOGRAPHY Left 12/21/2017   Procedure: LEFT HEART CATH AND CORONARY ANGIOGRAPHY;  Surgeon: Saralyn Pilar,  Sheppard Coil, MD;  Location: [facility];  Service: Cardiovascular;  Laterality: Left;  . NISSEN FUNDOPLICATION    . POSTERIOR LUMBAR FUSION 4 LEVEL N/A 06/05/2016   Procedure: Thoracic six to pelvis fixation with Smith-Peterson Thoracic osteotomies at Thoracic ten-Thoracic Twelve  with AIRO, Removal hardware lumbar two to lumbar five;  Surgeon: Erline Levine, MD;  Location: [facility];  Service: Neurosurgery;  Laterality: N/A;  . SPINAL GROWTH RODS Bilateral 2018  . SYMPATHECTOMY     ? improve blood flow to legs  . TONSILECTOMY, ADENOIDECTOMY, BILATERAL MYRINGOTOMY AND TUBES      ROS- all systems are reviewed and negative except as per HPI above  Current Outpatient Medications  Medication Sig Dispense Refill  . amLODipine (NORVASC) 2.5 MG tablet Take 2.5 mg by mouth every evening.     Marland Kitchen aspirin EC 81 MG tablet Take 81 mg by mouth at bedtime.     . calcium citrate-vitamin D (CITRACAL+D) 315-200 MG-UNIT tablet Take 1 tablet by mouth daily.     . fluticasone (FLONASE) 50 MCG/ACT nasal spray Place 2 sprays into both nostrils at bedtime as needed for allergies.     . furosemide (LASIX) 20 MG tablet Take 20 mg by mouth daily as needed.    . hydrochlorothiazide (HYDRODIURIL) 25 MG tablet Take 1 tablet by mouth daily.     . Multiple Vitamins-Minerals (OCUVITE EXTRA PO) Take 1 tablet by mouth daily.     Marland Kitchen nystatin (MYCOSTATIN/NYSTOP) powder Apply 1 g topically daily as needed (skin irritation).     . pravastatin (PRAVACHOL) 80 MG tablet Take 80 mg by mouth at bedtime.  3  . valsartan-hydrochlorothiazide (DIOVAN-HCT) 160-12.5 MG per tablet Take 1 tablet by mouth daily.  3  . Vibegron (GEMTESA) 75 MG TABS Take 75 mg by mouth daily. 90 tablet 3  . potassium chloride SA (K-DUR,KLOR-CON) 20 MEQ tablet Take 20 mEq by mouth every evening.      No current facility-administered medications for this visit.    Physical Exam: Vitals:   07/15/20 1519  BP: (!) 146/70  Pulse: 78  SpO2: 96%  Weight: 178 lb 3.2 oz (80.8 kg)  Height: 5' (1.524 m)    GEN- The patient is well appearing, alert and oriented x 3 today.   Head- normocephalic, atraumatic Eyes-  Sclera clear, conjunctiva pink Ears- hearing intact Oropharynx- clear Lungs- Clear to ausculation bilaterally, normal work of  breathing Chest- ICD pocket is well healed Heart- Regular rate and rhythm, no murmurs, rubs or gallops, PMI not laterally displaced GI- soft, NT, ND, + BS Extremities- no clubbing, cyanosis, or edema  ICD interrogation- reviewed in detail today,  See PACEART report  ekg tracing ordered today is personally reviewed and shows ***  Wt Readings from Last 3 Encounters:  07/15/20 178 lb 3.2 oz (80.8 kg)  03/05/20 175 lb (79.4 kg)  02/08/20 175 lb (79.4 kg)    Assessment and Plan:  1.  Chronic systolic dysfunction/ nonischemic CM/ prior syncope euvolemic today Stable on an appropriate medical regimen Normal ICD function See P[facility] Art report No changes today she is not device dependant today   Risks, benefits and potential toxicities for medications prescribed and/or refilled reviewed with patient today.   Return to see EP PA in a year  Thompson Grayer MD, [facility] 07/15/2020 3:26 PM

## 2020-07-15 NOTE — Progress Notes (Signed)
PCP: Idelle Crouch, MD Primary Cardiologist: Dr Saralyn Pilar Primary EP: Dr Rayann Heman  Allison Mclean is a 85 y.o. female who presents today for routine electrophysiology followup.  She reports having feeling shortness of breath more than usual. She has had some dizziness and lightheadedness when standing up to quickly, but denies any syncopal episodes. Although she does feel like she is about to pass out. Today, she denies symptoms of palpitations, chest pain, or lower extremity edema.The patient is otherwise without complaint today.   Past Medical History:  Diagnosis Date  . Acid reflux 11/22/2014  . 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 Chronic systolic heart failure ([place])   . GERD (gastroesophageal reflux disease)   . History of hiatal hernia    s/p  . History of kidney stones   . History of lumbar fusion (12/19/2013) (posterior lumbar fusion from L2-L5) 07/02/2015   Pedicle screws and posterior hardware at L5 L4-L3 and L2. Neil disc block at the L4-5 disc space. Slight anterior subluxation of L4 on L5. Different configuration and this had L3-4. Degenerative changes of the lumbar disc spaces. L5-S1 disc space well-maintained .   Marland Kitchen Hyperlipidemia   . Hypertension   . Jackhammer esophagus   . Opiate use (10 MME/day) 07/02/2015  . Osteopenia, senile 07/02/2015  . Thoracic central spinal stenosis (T7-8, T8-9, T9-10, T10-11, T11-12, and T12-L1) 07/02/2015  . Thoracic facet syndrome (Left) 07/02/2015  . Thoracic foraminal stenosis (Moderate to severe at: Right T5; Bilateral T9; Left T11) 07/02/2015  . Thoracic spondylosis with radiculopathy (Left) 07/09/2015  . Wears dentures    partial lower   Past Surgical History:  Procedure Laterality Date  . ABDOMINAL EXPOSURE N/A 06/02/2016   Procedure: ABDOMINAL EXPOSURE;  Surgeon: Angelia Mould, MD;  Location: [place];  Service: Vascular;  Laterality: N/A;  . ABDOMINAL HYSTERECTOMY    . ANTERIOR LAT LUMBAR FUSION N/A 06/02/2016   Procedure: L1-2 Left  Anterior lateral lumbar interbody fusion;  Surgeon: Erline Levine, MD;  Location: [place];  Service: Neurosurgery;  Laterality: N/A;  L1-2 Anterior lateral lumbar interbody fusion  . ANTERIOR LUMBAR FUSION N/A 06/02/2016   Procedure: Lumbar five -sacral one Anterior lumbar interbody fusion with Dr. Deitra Mayo; Lumbar one-two Left Anterior lateral lumbar interbody fusion;  Surgeon: Erline Levine, MD;  Location: [place];  Service: Neurosurgery;  Laterality: N/A;  . APPENDECTOMY    . APPLICATION OF INTRAOPERATIVE CT SCAN N/A 06/05/2016   Procedure: APPLICATION OF INTRAOPERATIVE CT SCAN;  Surgeon: Erline Levine, MD;  Location: [place];  Service: Neurosurgery;  Laterality: N/A;  . BACK SURGERY     2005, 2006, 2015  . CARDIAC CATHETERIZATION N/A 02/07/2015   Procedure: Left Heart Cath and Coronary Angiography;  Surgeon: Teodoro Spray, MD;  Location: [facility];  Service: Cardiovascular;  Laterality: N/A;  . CHOLECYSTECTOMY    . ESOPHAGOGASTRODUODENOSCOPY (EGD) WITH PROPOFOL N/A 02/03/2016   Procedure: ESOPHAGOGASTRODUODENOSCOPY (EGD) WITH PROPOFOL;  Surgeon: Lucilla Lame, MD;  Location: [place];  Service: Endoscopy;  Laterality: N/A;  . ESOPHAGOGASTRODUODENOSCOPY (EGD) WITH PROPOFOL N/A 03/02/2016   Procedure: ESOPHAGOGASTRODUODENOSCOPY (EGD) WITH PROPOFOL;  Surgeon: Lucilla Lame, MD;  Location: [place];  Service: Endoscopy;  Laterality: N/A;  . ICD IMPLANT N/A 03/08/2018   [employer] Fortify Assura DR model [id number] (serial  Number [id number]) ICD by Dr Rayann Heman for treatment of syncope and reduced EF.  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 LEFT HEART CATH AND CORONARY ANGIOGRAPHY Left  12/21/2017   Procedure: LEFT HEART CATH AND CORONARY ANGIOGRAPHY;  Surgeon: Isaias Cowman, MD;  Location: [place] [facility];  Service: Cardiovascular;  Laterality: Left;  . NISSEN FUNDOPLICATION    . POSTERIOR LUMBAR FUSION 4 LEVEL N/A 06/05/2016   Procedure: Thoracic six to  pelvis fixation with Smith-Peterson Thoracic osteotomies at Thoracic ten-Thoracic Twelve with AIRO, Removal hardware lumbar two to lumbar five;  Surgeon: Erline Levine, MD;  Location: [place];  Service: Neurosurgery;  Laterality: N/A;  . SPINAL GROWTH RODS Bilateral 2018  . SYMPATHECTOMY     ? improve blood flow to legs  . TONSILECTOMY, ADENOIDECTOMY, BILATERAL MYRINGOTOMY AND TUBES      ROS- all systems are reviewed and negative except as per HPI above  Current Outpatient Medications  Medication Sig Dispense Refill  . amLODipine (NORVASC) 2.5 MG tablet Take 2.5 mg by mouth every evening.     Marland Kitchen aspirin EC 81 MG tablet Take 81 mg by mouth at bedtime.     . calcium citrate-vitamin D (CITRACAL+D) 315-200 MG-UNIT tablet Take 1 tablet by mouth daily.     . fluticasone (FLONASE) 50 MCG/ACT nasal spray Place 2 sprays into both nostrils at bedtime as needed for allergies.     . furosemide (LASIX) 20 MG tablet Take 20 mg by mouth daily as needed.    . hydrochlorothiazide (HYDRODIURIL) 25 MG tablet Take 1 tablet by mouth daily.     . Multiple Vitamins-Minerals (OCUVITE EXTRA PO) Take 1 tablet by mouth daily.     Marland Kitchen nystatin (MYCOSTATIN/NYSTOP) powder Apply 1 g topically daily as needed (skin irritation).     . pravastatin (PRAVACHOL) 80 MG tablet Take 80 mg by mouth at bedtime.  3  . valsartan-hydrochlorothiazide (DIOVAN-HCT) 160-12.5 MG per tablet Take 1 tablet by mouth daily.  3  . Vibegron (GEMTESA) 75 MG TABS Take 75 mg by mouth daily. 90 tablet 3  . potassium chloride SA (K-DUR,KLOR-CON) 20 MEQ tablet Take 20 mEq by mouth every evening.      No current facility-administered medications for this visit.    Physical Exam: Vitals:   07/15/20 1519  BP: (!) 146/70  Pulse: 78  SpO2: 96%  Weight: 178 lb 3.2 oz (80.8 kg)  Height: 5' (1.524 m)    GEN- The patient is well appearing, alert and oriented x 3 today.   Head- normocephalic, atraumatic Eyes-  Sclera clear, conjunctiva pink Ears- hearing  intact Oropharynx- clear Lungs- Clear to ausculation bilaterally, normal work of breathing Chest- ICD pocket is well healed Heart- Regular rate and rhythm, no murmurs, rubs or gallops, PMI not laterally displaced GI- soft, NT, ND, + BS Extremities- no clubbing, cyanosis, or edema   ekg tracing ordered today is personally reviewed and shows sinus with PVCs  Wt Readings from Last 3 Encounters:  07/15/20 178 lb 3.2 oz (80.8 kg)  03/05/20 175 lb (79.4 kg)  02/08/20 175 lb (79.4 kg)    Assessment and Plan:  1.  Chronic systolic dysfunction/ nonischemic CM/ prior syncope euvolemic today Stable on an appropriate medical regimen Normal ICD function by remotes (up to date) No changes today she is not device dependant today   Risks, benefits and potential toxicities for medications prescribed and/or refilled reviewed with patient today.   Return to see EP PA in a year  Thompson Grayer MD, [facility] 07/15/2020 3:37 PM

## 2020-07-15 NOTE — Patient Instructions (Signed)
Medication Instructions:  Your physician recommends that you continue on your current medications as directed. Please refer to the Current Medication list given to you today.  Labwork: None ordered.  Testing/Procedures: None ordered.  Follow-Up: Your physician wants you to follow-up in: one year with Thompson Grayer, MD or one of the following Advanced Practice Providers on your designated Care Team:      Legrand Como "Jonni Sanger" Chalmers Cater, [place]   You will receive a reminder letter in the mail two months in advance. If you don't receive a letter, please call our office to schedule the follow-up appointment.  Remote monitoring is used to monitor your ICD from home. This monitoring reduces the number of office visits required to check your device to one time per year. It allows [place] to keep an eye on the functioning of your device to ensure it is working properly. You are scheduled for a device check from home on 09/13/20. You may send your transmission at any time that day. If you have a wireless device, the transmission will be sent automatically. After your physician reviews your transmission, you will receive a postcard with your next transmission date.  Any Other Special Instructions Will Be Listed Below (If Applicable).  If you need a refill on your cardiac medications before your next appointment, please call your pharmacy.

## 2020-09-13 ENCOUNTER — Ambulatory Visit (INDEPENDENT_AMBULATORY_CARE_PROVIDER_SITE_OTHER): Payer: Medicare Other

## 2020-09-13 DIAGNOSIS — I428 Other cardiomyopathies: Secondary | ICD-10-CM

## 2020-09-16 LAB — CUP PACEART REMOTE DEVICE CHECK
Battery Remaining Longevity: 77 mo
Battery Remaining Percentage: 74 %
Battery Voltage: 2.98 V
Brady Statistic AP VP Percent: 1 %
Brady Statistic AP VS Percent: 25 %
Brady Statistic AS VP Percent: 1 %
Brady Statistic AS VS Percent: 72 %
Brady Statistic RA Percent Paced: 23 %
Brady Statistic RV Percent Paced: 1 %
Date Time Interrogation Session: 20220610185421
HighPow Impedance: 84 Ohm
HighPow Impedance: 84 Ohm
Implantable Lead Implant Date: 20191203
Implantable Lead Implant Date: 20191203
Implantable Lead Location: 753859
Implantable Lead Location: 753860
Implantable Pulse Generator Implant Date: 20191203
Lead Channel Impedance Value: 490 Ohm
Lead Channel Impedance Value: 680 Ohm
Lead Channel Pacing Threshold Amplitude: 0.375 V
Lead Channel Pacing Threshold Amplitude: 0.5 V
Lead Channel Pacing Threshold Pulse Width: 0.5 ms
Lead Channel Pacing Threshold Pulse Width: 0.5 ms
Lead Channel Sensing Intrinsic Amplitude: 0.6 mV
Lead Channel Sensing Intrinsic Amplitude: 3.8 mV
Lead Channel Setting Pacing Amplitude: 0.625
Lead Channel Setting Pacing Amplitude: 2 V
Lead Channel Setting Pacing Pulse Width: 0.5 ms
Lead Channel Setting Sensing Sensitivity: 0.5 mV
Pulse Gen Serial Number: 9854527

## 2020-10-04 NOTE — Progress Notes (Signed)
Remote ICD transmission.   

## 2020-12-27 ENCOUNTER — Ambulatory Visit (INDEPENDENT_AMBULATORY_CARE_PROVIDER_SITE_OTHER): Payer: Medicare Other

## 2020-12-27 DIAGNOSIS — I428 Other cardiomyopathies: Secondary | ICD-10-CM | POA: Diagnosis not present

## 2020-12-29 LAB — CUP PACEART REMOTE DEVICE CHECK
Battery Remaining Longevity: 74 mo
Battery Remaining Percentage: 72 %
Battery Voltage: 2.98 V
Brady Statistic AP VP Percent: 1 %
Brady Statistic AP VS Percent: 24 %
Brady Statistic AS VP Percent: 1 %
Brady Statistic AS VS Percent: 73 %
Brady Statistic RA Percent Paced: 23 %
Brady Statistic RV Percent Paced: 1 %
Date Time Interrogation Session: 20220923015046
HighPow Impedance: 90 Ohm
HighPow Impedance: 90 Ohm
Implantable Lead Implant Date: 20191203
Implantable Lead Implant Date: 20191203
Implantable Lead Location: 753859
Implantable Lead Location: 753860
Implantable Pulse Generator Implant Date: 20191203
Lead Channel Impedance Value: 490 Ohm
Lead Channel Impedance Value: 550 Ohm
Lead Channel Pacing Threshold Amplitude: 0.5 V
Lead Channel Pacing Threshold Amplitude: 0.5 V
Lead Channel Pacing Threshold Pulse Width: 0.5 ms
Lead Channel Pacing Threshold Pulse Width: 0.5 ms
Lead Channel Sensing Intrinsic Amplitude: 1 mV
Lead Channel Sensing Intrinsic Amplitude: 4.1 mV
Lead Channel Setting Pacing Amplitude: 0.75 V
Lead Channel Setting Pacing Amplitude: 2 V
Lead Channel Setting Pacing Pulse Width: 0.5 ms
Lead Channel Setting Sensing Sensitivity: 0.5 mV
Pulse Gen Serial Number: 9854527

## 2020-12-31 NOTE — Progress Notes (Addendum)
Remote ICD transmission.   

## 2021-03-13 ENCOUNTER — Telehealth: Payer: Self-pay | Admitting: Internal Medicine

## 2021-03-13 NOTE — Telephone Encounter (Signed)
Patient would like to have the date changed to her remote transmission, as she is going to be out of town on the date she is scheduled.

## 2021-03-13 NOTE — Telephone Encounter (Signed)
I spoke with the patient and rescheduled her appointment.

## 2021-04-14 ENCOUNTER — Ambulatory Visit (INDEPENDENT_AMBULATORY_CARE_PROVIDER_SITE_OTHER): Payer: Medicare HMO

## 2021-04-14 DIAGNOSIS — I428 Other cardiomyopathies: Secondary | ICD-10-CM

## 2021-04-14 LAB — CUP PACEART REMOTE DEVICE CHECK
Battery Remaining Longevity: 72 mo
Battery Remaining Percentage: 69 %
Battery Voltage: 2.98 V
Brady Statistic AP VP Percent: 1 %
Brady Statistic AP VS Percent: 23 %
Brady Statistic AS VP Percent: 1 %
Brady Statistic AS VS Percent: 74 %
Brady Statistic RA Percent Paced: 21 %
Brady Statistic RV Percent Paced: 1 %
Date Time Interrogation Session: 20230109031030
HighPow Impedance: 75 Ohm
HighPow Impedance: 75 Ohm
Implantable Lead Implant Date: 20191203
Implantable Lead Implant Date: 20191203
Implantable Lead Location: 753859
Implantable Lead Location: 753860
Implantable Pulse Generator Implant Date: 20191203
Lead Channel Impedance Value: 480 Ohm
Lead Channel Impedance Value: 600 Ohm
Lead Channel Pacing Threshold Amplitude: 0.5 V
Lead Channel Pacing Threshold Amplitude: 0.5 V
Lead Channel Pacing Threshold Pulse Width: 0.5 ms
Lead Channel Pacing Threshold Pulse Width: 0.5 ms
Lead Channel Sensing Intrinsic Amplitude: 0.7 mV
Lead Channel Sensing Intrinsic Amplitude: 3.5 mV
Lead Channel Setting Pacing Amplitude: 0.75 V
Lead Channel Setting Pacing Amplitude: 2 V
Lead Channel Setting Pacing Pulse Width: 0.5 ms
Lead Channel Setting Sensing Sensitivity: 0.5 mV
Pulse Gen Serial Number: 9854527

## 2021-04-16 ENCOUNTER — Other Ambulatory Visit: Payer: Self-pay | Admitting: Internal Medicine

## 2021-04-16 DIAGNOSIS — Z1231 Encounter for screening mammogram for malignant neoplasm of breast: Secondary | ICD-10-CM

## 2021-04-22 NOTE — Progress Notes (Signed)
Remote ICD transmission.   

## 2021-05-09 ENCOUNTER — Other Ambulatory Visit: Payer: Self-pay | Admitting: Internal Medicine

## 2021-05-09 ENCOUNTER — Other Ambulatory Visit (HOSPITAL_COMMUNITY): Payer: Self-pay | Admitting: Internal Medicine

## 2021-05-09 ENCOUNTER — Other Ambulatory Visit: Payer: Self-pay

## 2021-05-09 ENCOUNTER — Ambulatory Visit
Admission: RE | Admit: 2021-05-09 | Discharge: 2021-05-09 | Disposition: A | Discharge status: Home / Self Care | Payer: Medicare HMO | Source: Ambulatory Visit | Attending: Internal Medicine | Admitting: Internal Medicine

## 2021-05-09 DIAGNOSIS — M25551 Pain in right hip: Secondary | ICD-10-CM

## 2021-05-26 ENCOUNTER — Ambulatory Visit
Admission: RE | Admit: 2021-05-26 | Discharge: 2021-05-26 | Disposition: A | Discharge status: Home / Self Care | Payer: Medicare HMO | Source: Ambulatory Visit | Attending: Internal Medicine | Admitting: Internal Medicine

## 2021-05-26 ENCOUNTER — Other Ambulatory Visit: Payer: Self-pay

## 2021-05-26 DIAGNOSIS — Z1231 Encounter for screening mammogram for malignant neoplasm of breast: Secondary | ICD-10-CM | POA: Diagnosis not present

## 2021-05-27 ENCOUNTER — Other Ambulatory Visit: Payer: Self-pay | Admitting: Orthopedic Surgery

## 2021-06-09 ENCOUNTER — Inpatient Hospital Stay: Admission: RE | Admit: 2021-06-09 | Payer: 59 | Source: Ambulatory Visit

## 2021-06-11 ENCOUNTER — Other Ambulatory Visit: Payer: Self-pay

## 2021-06-11 ENCOUNTER — Encounter
Admission: RE | Admit: 2021-06-11 | Discharge: 2021-06-11 | Disposition: A | Discharge status: Home / Self Care | Payer: Medicare HMO | Source: Ambulatory Visit | Attending: Orthopedic Surgery | Admitting: Orthopedic Surgery

## 2021-06-11 VITALS — BP 135/65 | HR 73 | Resp 14 | Ht 60.0 in | Wt 179.9 lb

## 2021-06-11 DIAGNOSIS — Z01818 Encounter for other preprocedural examination: Secondary | ICD-10-CM | POA: Diagnosis present

## 2021-06-11 HISTORY — DX: Atherosclerotic heart disease of native coronary artery without angina pectoris: I25.10

## 2021-06-11 HISTORY — DX: Supraventricular tachycardia: I47.1

## 2021-06-11 HISTORY — DX: Heart failure, unspecified: I50.9

## 2021-06-11 HISTORY — DX: Malignant (primary) neoplasm, unspecified: C80.1

## 2021-06-11 HISTORY — DX: Nonrheumatic aortic (valve) stenosis: I35.0

## 2021-06-11 HISTORY — DX: Abnormal findings on diagnostic imaging of heart and coronary circulation: R93.1

## 2021-06-11 HISTORY — DX: Presence of automatic (implantable) cardiac defibrillator: Z95.810

## 2021-06-11 HISTORY — DX: Other cardiomyopathies: I42.8

## 2021-06-11 LAB — TYPE AND SCREEN
ABO/RH(D): O POS
Antibody Screen: NEGATIVE

## 2021-06-11 LAB — URINALYSIS, ROUTINE W REFLEX MICROSCOPIC
Bilirubin Urine: NEGATIVE
Glucose, UA: NEGATIVE mg/dL
Hgb urine dipstick: NEGATIVE
Ketones, ur: NEGATIVE mg/dL
Leukocytes,Ua: NEGATIVE
Nitrite: NEGATIVE
Protein, ur: NEGATIVE mg/dL
Specific Gravity, Urine: 1.012 (ref 1.005–1.030)
pH: 6 (ref 5.0–8.0)

## 2021-06-11 LAB — SURGICAL PCR SCREEN
MRSA, PCR: NEGATIVE
Staphylococcus aureus: NEGATIVE

## 2021-06-11 NOTE — Patient Instructions (Addendum)
Your procedure is scheduled on:06-20-21 Friday ?Report to the Registration Desk on the 1st floor of the [facility].Then proceed to the 2nd floor Surgery Desk in the [facility] ?To find out your arrival time, please call [phone] between 1PM - 3PM on:06-19-21 Thursday ? ?REMEMBER: ?Instructions that are not followed completely may result in serious medical risk, up to and including death; or upon the discretion of your surgeon and anesthesiologist your surgery may need to be rescheduled. ? ?Do not eat food after midnight the night before surgery.  ?No gum chewing, lozengers or hard candies. ? ?You may however, drink CLEAR liquids up to 2 hours before you are scheduled to arrive for your surgery. Do not drink anything within 2 hours of your scheduled arrival time. ? ?Clear liquids include: ?- water  ?- apple juice without pulp ?- gatorade (not RED colors) ?- black coffee or tea (Do NOT add milk or creamers to the coffee or tea) ?Do NOT drink anything that is not on this list. ? ?In addition, your doctor has ordered for you to drink the provided  ?Ensure Pre-Surgery Clear Carbohydrate Drink  ?Drinking this carbohydrate drink up to two hours before surgery helps to reduce insulin resistance and improve patient outcomes. Please complete drinking 2 hours prior to scheduled arrival time. ? ?TAKE THESE MEDICATIONS THE MORNING OF SURGERY WITH A SIP OF WATER: ?-amLODipine (NORVASC)  ? ?Call Dr Theodore Demark office today (06-11-21) to find out when you need to stop your aspirin EC 81 MG tablet ? ?One week prior to surgery: ?Stop Anti-inflammatories (NSAIDS) such as Advil, Aleve, Ibuprofen, Motrin, Naproxen, Naprosyn and Aspirin based products such as Excedrin, Goodys Powder, BC Powder.You may however, continue to take Tylenol if needed for pain up until the day of surgery. ? ?Stop ANY OVER THE COUNTER supplements/vitamins 7 days prior to surgery (CALCIUM CITRATE-VITAMIN D, MULTIVITAMIN)-Continue your potassium chloride SA  (KLOR-CON) up until the day prior to surgery ? ?No Alcohol for 24 hours before or after surgery. ? ?No Smoking including e-cigarettes for 24 hours prior to surgery.  ?No chewable tobacco products for at least 6 hours prior to surgery.  ?No nicotine patches on the day of surgery. ? ?Do not use any "recreational" drugs for at least a week prior to your surgery.  ?Please be advised that the combination of cocaine and anesthesia may have negative outcomes, up to and including death. ?If you test positive for cocaine, your surgery will be cancelled. ? ?On the morning of surgery brush your teeth with toothpaste and water, you may rinse your mouth with mouthwash if you wish. ?Do not swallow any toothpaste or mouthwash. ? ?Use CHG Soap directed on instruction sheet. ? ?Do not wear jewelry, make-up, hairpins, clips or nail polish. ? ?Do not wear lotions, powders, or perfumes.  ? ?Do not shave body from the neck down 48 hours prior to surgery just in case you cut yourself which could leave a site for infection.  ?Also, freshly shaved skin may become irritated if using the CHG soap. ? ?Contact lenses, hearing aids and dentures may not be worn into surgery. ? ?Do not bring valuables to the hospital. [facility] is not responsible for any missing/lost belongings or valuables.  ? ?Notify your doctor if there is any change in your medical condition (cold, fever, infection). ? ?Wear comfortable clothing (specific to your surgery type) to the hospital. ? ?After surgery, you can help prevent lung complications by doing breathing exercises.  ?Take deep breaths  and cough every 1-2 hours. Your doctor may order a device called an Incentive Spirometer to help you take deep breaths. ?When coughing or sneezing, hold a pillow firmly against your incision with both hands. This is called ?splinting.? Doing this helps protect your incision. It also decreases belly discomfort. ? ?If you are being admitted to the hospital overnight, leave your  suitcase in the car. ?After surgery it may be brought to your room. ? ?If you are being discharged the day of surgery, you will not be allowed to drive home. ?You will need a responsible adult (18 years or older) to drive you home and stay with you that night.  ? ?If you are taking public transportation, you will need to have a responsible adult (18 years or older) with you. ?Please confirm with your physician that it is acceptable to use public transportation.  ? ?Please call the [facility] Dept. at [phone] if you have any questions about these instructions. ? ?Surgery Visitation Policy: ? ?Patients undergoing a surgery or procedure may have one family member or support person with them as long as that person is not COVID-19 positive or experiencing its symptoms.  ?That person may remain in the waiting area during the procedure and may rotate out with other people. ? ?Inpatient Visitation:   ? ?Visiting hours are 7 a.m. to 8 p.m. ?Up to two visitors ages 16+ are allowed at one time in a patient room. The visitors may rotate out with other people during the day. Visitors must check out when they leave, or other visitors will not be allowed. One designated support person may remain overnight. ?The visitor must pass COVID-19 screenings, use hand sanitizer when entering and exiting the patient?s room and wear a mask at all times, including in the patient?s room. ?Patients must also wear a mask when staff or their visitor are in the room. ?Masking is required regardless of vaccination status.  ?

## 2021-06-13 ENCOUNTER — Encounter: Payer: Self-pay | Admitting: Internal Medicine

## 2021-06-13 NOTE — Progress Notes (Unsigned)
PERIOPERATIVE PRESCRIPTION FOR IMPLANTED CARDIAC DEVICE PROGRAMMING ? ?Patient Information: ?Name:  Allison Mclean  ?DOB:  1936-02-12  ?MRN:  [id number]  ?{TIP - You do not have to delete this tip  -  Copy the info from the staff message sent by the PAT staff  then press F2 here and paste the information using CTL - V on the next line :[id number]}  ?Planned Procedure: TOTAL HIP ARTHROPLASTY ANTERIOR APPROACH    ?Surgeon:  Dr. Hessie Knows, MD  ?Requesting device clearance: Honor Loh, FNP-C  ?Date of Procedure:  06/20/2021  ?Cautery will be used.  ? ?Please route documentation back me via [facility], or may fax report to [facility] PAT APP at [phone].  ?Device Information: ? ?Clinic EP Physician:  Thompson Grayer, MD  ? ?Device Type:  Defibrillator ?Armed forces logistics/support/administrative officer #:  [employer]. [employer]: [phone] ?Pacemaker Dependent?:  No. ?Date of Last Device Check:  04/14/2021 Remote Normal Device Function?:  Yes.   ? ?Electrophysiologist's Recommendations: ? ?Have magnet available. ?Provide continuous ECG monitoring when magnet is used or reprogramming is to be performed.  ?Procedure may interfere with device function.  Magnet should be placed over device during procedure. ? ?Per Device Clinic Standing Orders, ?Wanda Plump, RN  ?9:18 AM 06/13/2021  ?

## 2021-06-16 ENCOUNTER — Encounter: Payer: Self-pay | Admitting: Orthopedic Surgery

## 2021-06-16 NOTE — Progress Notes (Signed)
Perioperative Services  Pre-Admission/Anesthesia Testing Clinical Review  Date: 06/16/21  Patient Demographics:  Name: Allison Mclean DOB:   [birth year] MRN:   [id number]  Planned Surgical Procedure(s):    Case: [id number] Date/Time: 06/20/21 1215   Procedure: TOTAL HIP ARTHROPLASTY ANTERIOR APPROACH (Right: Hip)   Anesthesia type: Choice   Pre-op diagnosis: Primary osteoarthritis of right hip  M16.11   Location: [facility]   Surgeons: Hessie Knows, MD   NOTE: Available PAT nursing documentation and vital signs have been reviewed. Clinical nursing staff has updated patient's PMH/PSHx, current medication list, and drug allergies/intolerances to ensure comprehensive history available to assist in medical decision making as it pertains to the aforementioned surgical procedure and anticipated anesthetic course. Extensive review of available clinical information performed. [place] [facility] and PSHx updated with any diagnoses/procedures that  may have been inadvertently omitted during her intake with the pre-admission testing department's nursing staff.  Clinical Discussion:  Allison Mclean is a 86 y.o. female who is submitted for pre-surgical anesthesia review and clearance prior to her undergoing the above procedure. Patient has never been a smoker. Pertinent PMH includes: CAD, HFrEF, NSVT, dilated cardiomyopathy (s/p AICD placement), sick sinus syndrome, HTN, HLD, DOE, GERD/hiatal hernia (status post Niesen fundoplication), OA, thoracic spinal stenosis and facet syndrome (s/p fusion).  Patient is followed by cardiology Saralyn Pilar, MD). She was last seen in the cardiology clinic on 04/17/2021; notes reviewed.  At the time of her clinic visit, patient reported that she was "doing fair".  Patient denied any episodes of chest pain, however had chronic exertional dyspnea.  No PND, orthopnea, palpitations, vertiginous symptoms, or presyncope/syncope.  Patient with  intermittent peripheral edema that was reported to be at baseline.  Past medical history significant for cardiovascular diagnoses.   Patient reports history of MI in the past, however I do not see this in her medical record at the time of consult.  Patient has undergone multiple diagnostic left heart catheterizations as follows:   Catheterization performed on 02/11/2011 revealed normal coronary anatomy and no evidence of obstructive CAD.  Diagnostic left heart catheterization performed on 02/07/2015 revealed a moderately reduced left ventricular systolic function with an EF of 45-50%.  There was global hypokinesis noted.  Coronary anatomy once again noted to be normal with no evidence of obstructive CAD.  Last cardiac catheterization was performed on 12/21/2017 revealing dilated cardiomyopathy with a significantly reduced LVEF of 29%.  There was mild nonobstructive CAD noted; 20% proximal to mid RCA and 20% mid LAD stenoses.  Given the degree of noted disease, intervention was not required and medical management was recommended.  Myocardial perfusion imaging study performed on 06/08/2017 revealed a moderately reduced left ventricular systolic function with an EF of 35%.  There was diffuse anterior, apical, and inferior wall hypokinesis.  SPECT images revealed a fixed apical wall defect consistent with scar versus artifact, however there was no evidence of ischemia.  Study determined to be negative.  Cardiac MRI was performed on 10/05/2017 revealing a moderately to severely reduced left ventricular systolic function with an EF of 33%.  There was global moderate to severe hypokinesis.  There was mid wall layer fibrosis in the basal to mid anteroseptal and inferoseptal walls consistent with an idiopathic nonischemic cardiomyopathy.  Also there was subendocardial scarring in the basal to mid lateral walls consistent with a small subendocardial infarction.  There was no intracardiac thrombus  noted.  Patient underwent placement of a [facility] Jude  AICD on 03/08/2018.  Last TTE was performed on 06/28/2018 revealing a significantly reduced left ventricular systolic function with an EF of 30%.  There was moderate global hypokinesis.  Diastolic Doppler parameters consistent with impaired relaxation (G1DD). There was trivial aortic/pulmonary valve regurgitation, and mild mitral/tricuspid valve regurgitation.  There was no significant transvalvular gradient to suggest stenosis.    Blood pressure reasonably controlled at 138/70 on currently prescribed CCB, diuretic, and ARB therapies.  Patient is on a statin for her HLD.  She is not diabetic.  Functional capacity limited by age-related debility and underlying medical comorbidities, however patient still felt to be able to achieve at least 4 METS of activity without angina/anginal equivalent symptoms.  No changes were made to her medication regimen.  Patient follow-up with outpatient cardiology in 4 months or sooner if needed.   Allison Mclean is scheduled for an elective RIGHT TOTAL HIP ARTHROPLASTY on 06/20/2021 with Dr. Hessie Knows, MD.  Given patient's past medical history significant for cardiovascular diagnoses, presurgical cardiac clearance was sought by the PAT team. Per cardiology, "this patient is optimized for surgery and may proceed with the planned procedural course with a LOW risk of significant perioperative cardiovascular complications".  In review of her medication reconciliation, it is noted the patient is on daily antiplatelet therapy.  She has been instructed on recommendations from her cardiologist for holding her daily low-dose ASA for 5 days prior to her procedure with plans to restart as soon as postoperative bleeding risk felt to be minimized by her primary attending surgeon.  The patient is aware that her last dose of ASA should be on 06/14/2021.  Patient denies previous perioperative complications with anesthesia in the past. In  review of the available records, it is noted that patient underwent a MAC anesthetic course at [facility] (ASA III) in 06/2020 without documented complications.   Vitals with BMI 06/11/2021 07/15/2020 03/05/2020  Height _0  _1  _2   Weight 179 lbs 14 oz 178 lbs 3 oz 175 lbs  BMI 35.13 29.7 98.92  Systolic 119 417 408  Diastolic 65 70 72  Pulse 73 78 82    Providers/Specialists:   NOTE: Primary physician provider listed below. Patient may have been seen by APP or partner within same practice.   PROVIDER ROLE / SPECIALTY LAST Fabio Bering, MD Orthopedics (Surgeon) 05/22/2021  Idelle Crouch, MD Primary Care Provider 05/09/2021  Isaias Cowman, MD Cardiology 04/17/2021  Thompson Grayer, MD Electrophysiology 07/15/2020   Allergies:  Atorvastatin and Rosuvastatin  Current Home Medications:   No current facility-administered medications for this encounter.    amLODipine (NORVASC) 2.5 MG tablet   aspirin EC 81 MG tablet   CALCIUM CITRATE-VITAMIN D PO   carboxymethylcellulose (REFRESH PLUS) 0.5 % SOLN   fluticasone (FLONASE) 50 MCG/ACT nasal spray   furosemide (LASIX) 20 MG tablet   Multiple Vitamin (MULTIVITAMIN WITH MINERALS) TABS tablet   nystatin (MYCOSTATIN/NYSTOP) powder   potassium chloride SA (KLOR-CON M) 20 MEQ tablet   pravastatin (PRAVACHOL) 80 MG tablet   valsartan-hydrochlorothiazide (DIOVAN-HCT) 160-12.5 MG per tablet   White Petrolatum-Mineral Oil (SYSTANE NIGHTTIME) OINT   Vibegron (GEMTESA) 75 MG TABS   History:   Past Medical History:  Diagnosis Date   Adaptive colitis 11/22/2014   AICD (automatic cardioverter/defibrillator) present 03/08/2018   a.) [place] DR model [phone] (serial  Number [id number]) ICD   Coronary artery disease    a.) LHC 02/07/2015: EF 45-50; global  HK; normal coronary anatomy; no CAD. b.) LHC 12/21/2017: DCM with EF 29%; 20% p-m RCA, 20% mLAD; no interventions.   Cortical  senile cataract    Dilated cardiomyopathy ([place])    a.) TTE 01/26/2014: EF 50%. b.) TTE 09/16/2015: EF 40%. c.) TTE 05/14/2017: EF 30%. d.) TTE 06/28/2018: EF 30%.   DOE (dyspnea on exertion)    GERD (gastroesophageal reflux disease)    a.) resolved s/p Nissen fundoplication   HFrEF (heart failure with reduced ejection fraction) ([place])    a.) TTE 01/23/2014: EF 50%; global HK, mild LA dilation, mild LVH; triv MR, mild AR/TR/PR. b.) TTE 09/16/2015: EF 40%; mild LVH; mild LA dilation; global HK; mild panvalvular regurgitation. c.) TTE 05/14/2017: EF 30%; mod global HK, mild LA dilation, G1DD, triv AR/MR/PR, mild MR. d.) TTE 06/28/2018: EF 30%; mod HK; triv AR/PR, mild MR/TR; G1DD.   History of hiatal hernia    a.) s/p Nissen fundoplication   History of kidney stones    History of lumbar fusion (12/19/2013) (posterior lumbar fusion from L2-L5) 07/02/2015   Hyperlipidemia    Hypertension    IBS (irritable bowel syndrome)    [place] esophagus    NSVT (nonsustained ventricular tachycardia)    Osteoarthritis    Osteopenia, senile 07/02/2015   Skin cancer    SSS (sick sinus syndrome) (HCC)    a.) s/p AICD placement   Thoracic central spinal stenosis 07/02/2015   Thoracic facet syndrome (Left) 07/02/2015   Thoracic foraminal stenosis (Moderate to severe at: Right T5; Bilateral T9; Left T11) 07/02/2015   Thoracic spondylosis with radiculopathy (Left) 07/09/2015   Wears dentures    partial lower   Past Surgical History:  Procedure Laterality Date   ABDOMINAL EXPOSURE N/A 06/02/2016   Procedure: ABDOMINAL EXPOSURE;  Surgeon: Angelia Mould, MD;  Location: [place];  Service: Vascular;  Laterality: N/A;   ABDOMINAL HYSTERECTOMY     ANTERIOR LAT LUMBAR FUSION N/A 06/02/2016   Procedure: L1-2 Left Anterior lateral lumbar interbody fusion;  Surgeon: Erline Levine, MD;  Location: [place];  Service: Neurosurgery;  Laterality: N/A;  L1-2 Anterior lateral lumbar interbody fusion   ANTERIOR LUMBAR  FUSION N/A 06/02/2016   Procedure: Lumbar five -sacral one Anterior lumbar interbody fusion with Dr. Deitra Mayo; Lumbar one-two Left Anterior lateral lumbar interbody fusion;  Surgeon: Erline Levine, MD;  Location: [place];  Service: Neurosurgery;  Laterality: N/A;   APPENDECTOMY     APPLICATION OF INTRAOPERATIVE CT SCAN N/A 06/05/2016   Procedure: APPLICATION OF INTRAOPERATIVE CT SCAN;  Surgeon: Erline Levine, MD;  Location: [place];  Service: Neurosurgery;  Laterality: N/A;   BACK SURGERY     2005, 2006, 2015   BLEPHAROPLASTY Bilateral    CARDIAC CATHETERIZATION N/A 02/07/2015   Procedure: Left Heart Cath and Coronary Angiography;  Surgeon: Teodoro Spray, MD;  Location: [place] [facility];  Service: Cardiovascular;  Laterality: N/A;   CHOLECYSTECTOMY     ESOPHAGOGASTRODUODENOSCOPY (EGD) WITH PROPOFOL N/A 02/03/2016   Procedure: ESOPHAGOGASTRODUODENOSCOPY (EGD) WITH PROPOFOL;  Surgeon: Lucilla Lame, MD;  Location: [place];  Service: Endoscopy;  Laterality: N/A;   ESOPHAGOGASTRODUODENOSCOPY (EGD) WITH PROPOFOL N/A 03/02/2016   Procedure: ESOPHAGOGASTRODUODENOSCOPY (EGD) WITH PROPOFOL;  Surgeon: Lucilla Lame, MD;  Location: [place];  Service: Endoscopy;  Laterality: N/A;   ICD IMPLANT N/A 03/08/2018   [facility] [place] DR model [phone] (serial  Number [id number]) ICD by Dr Rayann Heman for treatment of syncope and reduced EF.  R sided implanted due  to persistent L SVC   LEFT HEART CATH AND CORONARY ANGIOGRAPHY Left 12/21/2017   Procedure: LEFT HEART CATH AND CORONARY ANGIOGRAPHY;  Surgeon: Isaias Cowman, MD;  Location: [place] [facility];  Service: Cardiovascular;  Laterality: Left;   LEFT HEART CATH AND CORONARY ANGIOGRAPHY Left 02/11/2011   Procedure: LEFT HEART CATH AND CORONARY ANGIOGRAPHY; Location: [place]; Surgeon: Bartholome Bill, MD   NISSEN FUNDOPLICATION     POSTERIOR LUMBAR FUSION 4 LEVEL N/A 06/05/2016   Procedure: Thoracic six to pelvis  fixation with Smith-Peterson Thoracic osteotomies at Thoracic ten-Thoracic Twelve with AIRO, Removal hardware lumbar two to lumbar five;  Surgeon: Erline Levine, MD;  Location: [place];  Service: Neurosurgery;  Laterality: N/A;   SPINAL GROWTH RODS Bilateral 2018   SYMPATHECTOMY     ? improve blood flow to legs   TONSILECTOMY, ADENOIDECTOMY, BILATERAL MYRINGOTOMY AND TUBES     Family History  Problem Relation Age of Onset   Diabetes Mother    Arthritis Mother    Heart disease Mother    Hypertension Mother    Diabetes Father    Asthma Father    Heart disease Father    Hypertension Father    Stroke Father    Breast cancer Neg Hx    Social History   Tobacco Use   Smoking status: Never   Smokeless tobacco: Never  Vaping Use   Vaping Use: Never used  Substance Use Topics   Alcohol use: No   Drug use: No    Pertinent Clinical Results:  LABS: Labs reviewed: Acceptable for surgery.   Ref Range & Units 05/09/2021  WBC (White Blood Cell Count) 4.1 - 10.2 103/uL 5.9   RBC (Red Blood Cell Count) 4.04 - 5.48 106/uL 4.81   Hemoglobin 12.0 - 15.0 gm/dL 14.8   Hematocrit 35.0 - 47.0 % 45.3   MCV (Mean Corpuscular Volume) 80.0 - 100.0 fl 94.2   MCH (Mean Corpuscular Hemoglobin) 27.0 - 31.2 pg 30.8   MCHC (Mean Corpuscular Hemoglobin Concentration) 32.0 - 36.0 gm/dL 32.7   Platelet Count 150 - 450 103/uL 223   RDW-CV (Red Cell Distribution Width) 11.6 - 14.8 % 12.6   MPV (Mean Platelet Volume) 9.4 - 12.4 fl 9.8   Neutrophils 1.50 - 7.80 103/uL 3.08   Lymphocytes 1.00 - 3.60 103/uL 2.14   Monocytes 0.00 - 1.50 103/uL 0.56   Eosinophils 0.00 - 0.55 103/uL 0.10   Basophils 0.00 - 0.09 103/uL 0.05   Neutrophil % 32.0 - 70.0 % 51.9   Lymphocyte % 10.0 - 50.0 % 36.0   Monocyte % 4.0 - 13.0 % 9.4   Eosinophil % 1.0 - 5.0 % 1.7   Basophil% 0.0 - 2.0 % 0.8   Immature Granulocyte % <=0.7 % 0.2   Immature Granulocyte Count <=0.06 10^3/L 0.01   Resulting Agency  [facility]  - LAB  Specimen Collected: 05/09/21 11:54 Last Resulted: 05/09/21 12:12  Received From: [facility]  Result Received: 05/26/21 10:17    Ref Range & Units 05/09/2021  Glucose 70 - 110 mg/dL 87   Sodium 136 - 145 mmol/L 135 Low    Potassium 3.6 - 5.1 mmol/L 4.4   Chloride 97 - 109 mmol/L 92 Low    Carbon Dioxide (CO2) 22.0 - 32.0 mmol/L 36.0 High    Urea Nitrogen (BUN) 7 - 25 mg/dL 13   Creatinine 0.6 - 1.1 mg/dL 0.7   Glomerular Filtration Rate (eGFR), MDRD Estimate >60 mL/min/1.73sq m 80  Calcium 8.7 - 10.3 mg/dL 11.4 High    AST  8 - 39 U/L 23   ALT  5 - 38 U/L 15   Alk Phos (alkaline Phosphatase) 34 - 104 U/L 61   Albumin 3.5 - 4.8 g/dL 4.3   Bilirubin, Total 0.3 - 1.2 mg/dL 0.6   Protein, Total 6.1 - 7.9 g/dL 6.9   A/G Ratio 1.0 - 5.0 gm/dL 1.7   Resulting Agency  [facility] - LAB  Specimen Collected: 05/09/21 11:54 Last Resulted: 05/09/21 15:26  Received From: [facility]  Result Received: 05/26/21 10:17   Component Date Value Ref Range Status   MRSA, PCR 06/11/2021 NEGATIVE  NEGATIVE Final   Staphylococcus aureus 06/11/2021 NEGATIVE  NEGATIVE Final   Comment: (NOTE) The Xpert SA Assay (FDA approved for NASAL specimens in patients 26 years of age and older), is one component of a comprehensive surveillance program. It is not intended to diagnose infection nor to guide or monitor treatment. Performed at [facility], [place], [place] [address]    Color, Urine 06/11/2021 YELLOW (A)  YELLOW Final   APPearance 06/11/2021 HAZY (A)  CLEAR Final   Specific Gravity, Urine 06/11/2021 1.012  1.005 - 1.030 Final   pH 06/11/2021 6.0  5.0 - 8.0 Final   Glucose, UA 06/11/2021 NEGATIVE  NEGATIVE mg/dL Final   Hgb urine dipstick 06/11/2021 NEGATIVE  NEGATIVE Final   Bilirubin Urine 06/11/2021 NEGATIVE  NEGATIVE Final   Ketones, ur 06/11/2021 NEGATIVE  NEGATIVE mg/dL Final   Protein, ur 06/11/2021 NEGATIVE   NEGATIVE mg/dL Final   Nitrite 06/11/2021 NEGATIVE  NEGATIVE Final   Leukocytes,Ua 06/11/2021 NEGATIVE  NEGATIVE Final   Performed at [facility], [address] [place], [place] [address]   ABO/RH(D) 06/11/2021 O POS   Final   Antibody Screen 06/11/2021 NEG   Final   Sample Expiration 06/11/2021 06/25/2021,2359   Final   Extend sample reason 06/11/2021    Final                   Value:NO TRANSFUSIONS OR PREGNANCY IN THE PAST 3 MONTHS Performed at [facility], [address] [place], [place] [address]     ECG: Date: 06/11/2021 Time ECG obtained: 1306 PM Rate: 74 bpm Rhythm: Normal sinus rhythm; LVH, LAFB Axis (leads I and aVF): Normal Intervals: PR 154 ms. QRS 100 ms. QTc 444 ms. ST segment and T wave changes: No evidence of acute ST segment elevation or depression Comparison: Previous tracing on 03/09/2018 showed electronic atrial pacemaker.   IMAGING / PROCEDURES: CT HIP RIGHT WO CONTRAST performed on 05/09/2021 No evidence of acute right hip fracture or dislocation. Mild right hip degenerative changes. Chronic partial tear of the right common hamstring tendon with underlying fragmented enthesophytes.   TRANSTHORACIC ECHOCARDIOGRAM performed on 06/28/2018 LVEF 30% Global hypokinesis Diastolic Doppler parameters consistent with abnormal relaxation (G1DD) Trivial AR and PR Mild MR and TR No valvular stenosis no pericardial effusion  LEFT HEART CATHETERIZATION AND CORONARY ANGIOGRAPHY performed on 12/21/2017 Dilated cardiomyopathy with estimated LVEF of 29% Right coronary artery originates from noncoronary cusp Insignificant CAD 20% proximal to mid RCA 20% mid LAD Recommendations Elective ICD placement    CARDIAC MRI MORPHOLOGY AND FUNCTION WITH AND WITHOUT CONTRAST performed on 10/05/2017 The left ventricle is mildly enlarged in cavity size with normal wall thickness. Global systolic function is moderately-severely reduced with an LV ejection  fraction calculated at 33%. There is global moderate-to-severe  hypokinesis.  The right ventricle is normal in cavity size, wall thickness, and systolic function.    The left atrium is moderately enlarged. The right atrium is normal in size.  The aortic valve is trileaflet in morphology. There is no significant aortic valve stenosis or regurgitation. There is no significant valvular disease.  Delayed enhancement imaging demonstrates mid wall layer fibrosis in the basal-to-mid anteroseptal and inferoseptal walls. This is a nonspecific finding but usually is found in idiopathic nonischemic cardiomyopathy. There is also subendocardial scarring in the basal-to-mid lateral wall. This is consistent with a small subendocardial infarction.  No intracardiac thrombus visualized  Impression and Plan:  Allison Mclean has been referred for pre-anesthesia review and clearance prior to her undergoing the planned anesthetic and procedural courses. Available labs, pertinent testing, and imaging results were personally reviewed by me. This patient has been appropriately cleared by cardiology with an overall LOW risk of significant perioperative cardiovascular complications. Completed perioperative prescription for cardiac device management documentation completed by primary cardiology team and placed on patient's chart for review by the surgical/anesthetic team on the day of her procedure.   Based on clinical review performed today (06/16/21), barring any significant acute changes in the patient's overall condition, it is anticipated that she will be able to proceed with the planned surgical intervention. Any acute changes in clinical condition may necessitate her procedure being postponed and/or cancelled. Patient will meet with anesthesia team (MD and/or CRNA) on the day of her procedure for preoperative evaluation/assessment. Questions regarding anesthetic course will be fielded at that time.   Pre-surgical instructions  were reviewed with the patient during her PAT appointment and questions were fielded by PAT clinical staff. Patient was advised that if any questions or concerns arise prior to her procedure then she should return a call to PAT and/or her surgeon's office to discuss.  Honor Loh, MSN, APRN, FNP-C, CEN [facility]  Peri-operative Services Nurse Practitioner Phone: [phone] Fax: [phone] 06/16/21 10:46 AM  NOTE: This note has been prepared using Dragon dictation software. Despite my best ability to proofread, there is always the potential that unintentional transcriptional errors may still occur from this process.

## 2021-06-18 ENCOUNTER — Other Ambulatory Visit: Payer: Self-pay

## 2021-06-18 ENCOUNTER — Other Ambulatory Visit
Admission: RE | Admit: 2021-06-18 | Discharge: 2021-06-18 | Disposition: A | Discharge status: Home / Self Care | Payer: Medicare HMO | Source: Ambulatory Visit | Attending: Orthopedic Surgery | Admitting: Orthopedic Surgery

## 2021-06-18 DIAGNOSIS — Z20822 Contact with and (suspected) exposure to covid-19: Secondary | ICD-10-CM | POA: Insufficient documentation

## 2021-06-18 DIAGNOSIS — Z1152 Encounter for screening for COVID-19: Secondary | ICD-10-CM

## 2021-06-18 DIAGNOSIS — Z01812 Encounter for preprocedural laboratory examination: Secondary | ICD-10-CM | POA: Insufficient documentation

## 2021-06-18 LAB — SARS CORONAVIRUS 2 (TAT 6-24 HRS): SARS Coronavirus 2: NEGATIVE

## 2021-06-19 MED ORDER — LACTATED RINGERS IV SOLN: INTRAVENOUS | Status: DC

## 2021-06-19 MED ORDER — CEFAZOLIN SODIUM-DEXTROSE 2-4 GM/100ML-% IV SOLN
2.0000 g | INTRAVENOUS | Status: AC
  Administered 2021-06-20: 2 g via INTRAVENOUS

## 2021-06-19 MED ORDER — CHLORHEXIDINE GLUCONATE 0.12 % MT SOLN: 15.0000 mL | Freq: Once | OROMUCOSAL | Status: AC

## 2021-06-19 MED ORDER — FAMOTIDINE 20 MG PO TABS
20.0000 mg | ORAL_TABLET | Freq: Once | ORAL | Status: AC
Start: 2021-06-19 — End: 2021-06-20

## 2021-06-19 MED ORDER — ORAL CARE MOUTH RINSE: 15.0000 mL | Freq: Once | OROMUCOSAL | Status: AC

## 2021-06-20 ENCOUNTER — Inpatient Hospital Stay
Admission: RE | Admit: 2021-06-20 | Discharge: 2021-06-25 | DRG: 470 | Disposition: A | Discharge status: Skilled Nursing Facility | Payer: Medicare HMO | Attending: Orthopedic Surgery | Admitting: Orthopedic Surgery

## 2021-06-20 ENCOUNTER — Encounter: Payer: Self-pay | Admitting: Orthopedic Surgery

## 2021-06-20 ENCOUNTER — Ambulatory Visit: Payer: Medicare HMO

## 2021-06-20 ENCOUNTER — Other Ambulatory Visit: Payer: Self-pay

## 2021-06-20 ENCOUNTER — Observation Stay: Payer: Medicare HMO

## 2021-06-20 ENCOUNTER — Encounter
Admission: RE | Disposition: A | Discharge status: Skilled Nursing Facility | Payer: Self-pay | Source: Home / Self Care | Attending: Orthopedic Surgery

## 2021-06-20 ENCOUNTER — Ambulatory Visit: Payer: Medicare HMO | Admitting: Urgent Care

## 2021-06-20 DIAGNOSIS — Z85828 Personal history of other malignant neoplasm of skin: Secondary | ICD-10-CM

## 2021-06-20 DIAGNOSIS — E669 Obesity, unspecified: Secondary | ICD-10-CM | POA: Diagnosis present

## 2021-06-20 DIAGNOSIS — Z79899 Other long term (current) drug therapy: Secondary | ICD-10-CM

## 2021-06-20 DIAGNOSIS — Z9049 Acquired absence of other specified parts of digestive tract: Secondary | ICD-10-CM

## 2021-06-20 DIAGNOSIS — Z87442 Personal history of urinary calculi: Secondary | ICD-10-CM

## 2021-06-20 DIAGNOSIS — I110 Hypertensive heart disease with heart failure: Secondary | ICD-10-CM | POA: Diagnosis present

## 2021-06-20 DIAGNOSIS — Z95810 Presence of automatic (implantable) cardiac defibrillator: Secondary | ICD-10-CM

## 2021-06-20 DIAGNOSIS — I420 Dilated cardiomyopathy: Secondary | ICD-10-CM | POA: Diagnosis present

## 2021-06-20 DIAGNOSIS — M1611 Unilateral primary osteoarthritis, right hip: Principal | ICD-10-CM | POA: Diagnosis present

## 2021-06-20 DIAGNOSIS — I495 Sick sinus syndrome: Secondary | ICD-10-CM | POA: Diagnosis present

## 2021-06-20 DIAGNOSIS — M810 Age-related osteoporosis without current pathological fracture: Secondary | ICD-10-CM | POA: Diagnosis present

## 2021-06-20 DIAGNOSIS — Z419 Encounter for procedure for purposes other than remedying health state, unspecified: Secondary | ICD-10-CM

## 2021-06-20 DIAGNOSIS — Z8249 Family history of ischemic heart disease and other diseases of the circulatory system: Secondary | ICD-10-CM

## 2021-06-20 DIAGNOSIS — E785 Hyperlipidemia, unspecified: Secondary | ICD-10-CM | POA: Diagnosis present

## 2021-06-20 DIAGNOSIS — Z90710 Acquired absence of both cervix and uterus: Secondary | ICD-10-CM

## 2021-06-20 DIAGNOSIS — Z7982 Long term (current) use of aspirin: Secondary | ICD-10-CM

## 2021-06-20 DIAGNOSIS — Z833 Family history of diabetes mellitus: Secondary | ICD-10-CM

## 2021-06-20 DIAGNOSIS — J309 Allergic rhinitis, unspecified: Secondary | ICD-10-CM | POA: Diagnosis present

## 2021-06-20 DIAGNOSIS — I5022 Chronic systolic (congestive) heart failure: Secondary | ICD-10-CM | POA: Diagnosis present

## 2021-06-20 DIAGNOSIS — Z6835 Body mass index (BMI) 35.0-35.9, adult: Secondary | ICD-10-CM

## 2021-06-20 DIAGNOSIS — I252 Old myocardial infarction: Secondary | ICD-10-CM

## 2021-06-20 DIAGNOSIS — K589 Irritable bowel syndrome without diarrhea: Secondary | ICD-10-CM | POA: Diagnosis present

## 2021-06-20 DIAGNOSIS — Z981 Arthrodesis status: Secondary | ICD-10-CM

## 2021-06-20 DIAGNOSIS — I2510 Atherosclerotic heart disease of native coronary artery without angina pectoris: Secondary | ICD-10-CM | POA: Diagnosis present

## 2021-06-20 DIAGNOSIS — Z20822 Contact with and (suspected) exposure to covid-19: Secondary | ICD-10-CM | POA: Diagnosis present

## 2021-06-20 DIAGNOSIS — K219 Gastro-esophageal reflux disease without esophagitis: Secondary | ICD-10-CM | POA: Diagnosis present

## 2021-06-20 DIAGNOSIS — Z96641 Presence of right artificial hip joint: Principal | ICD-10-CM

## 2021-06-20 HISTORY — DX: Unspecified malignant neoplasm of skin, unspecified: C44.90

## 2021-06-20 HISTORY — DX: Sick sinus syndrome: I49.5

## 2021-06-20 HISTORY — DX: Dilated cardiomyopathy: I42.0

## 2021-06-20 HISTORY — PX: TOTAL HIP ARTHROPLASTY: SHX124

## 2021-06-20 HISTORY — DX: Unspecified systolic (congestive) heart failure: I50.20

## 2021-06-20 HISTORY — DX: Other forms of dyspnea: R06.09

## 2021-06-20 HISTORY — DX: Cortical age-related cataract, unspecified eye: H25.019

## 2021-06-20 HISTORY — DX: Other ventricular tachycardia: I47.29

## 2021-06-20 HISTORY — DX: Irritable bowel syndrome, unspecified: K58.9

## 2021-06-20 HISTORY — DX: Unspecified osteoarthritis, unspecified site: M19.90

## 2021-06-20 LAB — CBC
HCT: 45.7 % (ref 36.0–46.0)
Hemoglobin: 14.7 g/dL (ref 12.0–15.0)
MCH: 30.5 pg (ref 26.0–34.0)
MCHC: 32.2 g/dL (ref 30.0–36.0)
MCV: 94.8 fL (ref 80.0–100.0)
Platelets: 236 10*3/uL (ref 150–400)
RBC: 4.82 MIL/uL (ref 3.87–5.11)
RDW: 12.1 % (ref 11.5–15.5)
WBC: 18.4 10*3/uL — ABNORMAL HIGH (ref 4.0–10.5)
nRBC: 0 % (ref 0.0–0.2)

## 2021-06-20 LAB — CREATININE, SERUM
Creatinine, Ser: 0.8 mg/dL (ref 0.44–1.00)
GFR, Estimated: >60 mL/min (ref >60)

## 2021-06-20 SURGERY — ARTHROPLASTY, HIP, TOTAL, ANTERIOR APPROACH
Anesthesia: General | Site: Hip | Laterality: Right

## 2021-06-20 MED ORDER — ALUM & MAG HYDROXIDE-SIMETH 200-200-20 MG/5ML PO SUSP: 30.0000 mL | ORAL | Status: DC | PRN

## 2021-06-20 MED ORDER — BUPIVACAINE LIPOSOME 1.3 % IJ SUSP
INTRAMUSCULAR | Status: AC
  Filled 2021-06-20: qty 20

## 2021-06-20 MED ORDER — PHENYLEPHRINE HCL-NACL 20-0.9 MG/250ML-% IV SOLN
INTRAVENOUS | Status: AC
  Filled 2021-06-20: qty 250

## 2021-06-20 MED ORDER — METHOCARBAMOL 500 MG PO TABS: 500.0000 mg | ORAL_TABLET | Freq: Four times a day (QID) | ORAL | Status: DC | PRN

## 2021-06-20 MED ORDER — SODIUM CHLORIDE (PF) 0.9 % IJ SOLN
INTRAMUSCULAR | Status: DC | PRN
  Administered 2021-06-20: 90 mL

## 2021-06-20 MED ORDER — ARTIFICIAL TEARS OPHTHALMIC OINT
1.0000 "application " | TOPICAL_OINTMENT | Freq: Every evening | OPHTHALMIC | Status: DC | PRN
  Filled 2021-06-20: qty 3.5

## 2021-06-20 MED ORDER — SUGAMMADEX SODIUM 200 MG/2ML IV SOLN
INTRAVENOUS | Status: DC | PRN
Start: 2021-06-20 — End: 2021-06-20
  Administered 2021-06-20: 200 mg via INTRAVENOUS

## 2021-06-20 MED ORDER — ACETAMINOPHEN 325 MG PO TABS
325.0000 mg | ORAL_TABLET | Freq: Four times a day (QID) | ORAL | Status: DC | PRN
  Administered 2021-06-21 – 2021-06-25 (×7): 650 mg via ORAL
  Filled 2021-06-20 (×7): qty 2

## 2021-06-20 MED ORDER — FLUTICASONE PROPIONATE 50 MCG/ACT NA SUSP
2.0000 | Freq: Every evening | NASAL | Status: DC | PRN
  Filled 2021-06-20: qty 16

## 2021-06-20 MED ORDER — NYSTATIN 100000 UNIT/GM EX POWD
1.0000 g | Freq: Every day | CUTANEOUS | Status: DC
  Administered 2021-06-25: 1 via TOPICAL
  Filled 2021-06-20: qty 15

## 2021-06-20 MED ORDER — ACETAMINOPHEN 10 MG/ML IV SOLN
INTRAVENOUS | Status: AC
Start: 2021-06-20 — End: ?
  Filled 2021-06-20: qty 100

## 2021-06-20 MED ORDER — ONDANSETRON HCL 4 MG/2ML IJ SOLN: 4.0000 mg | Freq: Four times a day (QID) | INTRAMUSCULAR | Status: DC | PRN

## 2021-06-20 MED ORDER — ACETAMINOPHEN 10 MG/ML IV SOLN
INTRAVENOUS | Status: DC | PRN
  Administered 2021-06-20: 1000 mg via INTRAVENOUS

## 2021-06-20 MED ORDER — METOCLOPRAMIDE HCL 5 MG/ML IJ SOLN
5.0000 mg | Freq: Three times a day (TID) | INTRAMUSCULAR | Status: DC | PRN
  Administered 2021-06-20: 10 mg via INTRAVENOUS
  Filled 2021-06-20: qty 2

## 2021-06-20 MED ORDER — NEOMYCIN-POLYMYXIN B GU 40-200000 IR SOLN
Status: AC
Start: 2021-06-20 — End: ?
  Filled 2021-06-20: qty 20

## 2021-06-20 MED ORDER — POLYVINYL ALCOHOL 1.4 % OP SOLN
1.0000 [drp] | Freq: Three times a day (TID) | OPHTHALMIC | Status: DC | PRN
  Filled 2021-06-20: qty 15

## 2021-06-20 MED ORDER — CEFAZOLIN SODIUM-DEXTROSE 2-4 GM/100ML-% IV SOLN
INTRAVENOUS | Status: AC
  Filled 2021-06-20: qty 100

## 2021-06-20 MED ORDER — ONDANSETRON HCL 4 MG/2ML IJ SOLN
INTRAMUSCULAR | Status: DC | PRN
  Administered 2021-06-20: 4 mg via INTRAVENOUS

## 2021-06-20 MED ORDER — SODIUM CHLORIDE 0.9 % IV SOLN: INTRAVENOUS | Status: DC

## 2021-06-20 MED ORDER — DEXAMETHASONE SODIUM PHOSPHATE 10 MG/ML IJ SOLN
INTRAMUSCULAR | Status: AC
  Filled 2021-06-20: qty 1

## 2021-06-20 MED ORDER — CHLORHEXIDINE GLUCONATE 0.12 % MT SOLN
OROMUCOSAL | Status: AC
  Administered 2021-06-20: 15 mL via OROMUCOSAL
  Filled 2021-06-20: qty 15

## 2021-06-20 MED ORDER — HYDROCHLOROTHIAZIDE 12.5 MG PO TABS
12.5000 mg | ORAL_TABLET | Freq: Every evening | ORAL | Status: DC
Start: 2021-06-20 — End: 2021-06-25
  Administered 2021-06-21 – 2021-06-24 (×3): 12.5 mg via ORAL
  Filled 2021-06-20 (×4): qty 1

## 2021-06-20 MED ORDER — SODIUM CHLORIDE FLUSH 0.9 % IV SOLN
INTRAVENOUS | Status: AC
  Filled 2021-06-20: qty 40

## 2021-06-20 MED ORDER — SENNOSIDES-DOCUSATE SODIUM 8.6-50 MG PO TABS
1.0000 | ORAL_TABLET | Freq: Every evening | ORAL | Status: DC | PRN
  Administered 2021-06-24: 1 via ORAL
  Filled 2021-06-20: qty 1

## 2021-06-20 MED ORDER — ROCURONIUM BROMIDE 10 MG/ML (PF) SYRINGE
PREFILLED_SYRINGE | INTRAVENOUS | Status: AC
  Filled 2021-06-20: qty 10

## 2021-06-20 MED ORDER — IRBESARTAN 150 MG PO TABS
150.0000 mg | ORAL_TABLET | Freq: Every evening | ORAL | Status: DC
  Administered 2021-06-21 – 2021-06-24 (×4): 150 mg via ORAL
  Filled 2021-06-20 (×4): qty 1

## 2021-06-20 MED ORDER — ONDANSETRON HCL 4 MG/2ML IJ SOLN
INTRAMUSCULAR | Status: AC
  Filled 2021-06-20: qty 2

## 2021-06-20 MED ORDER — ZOLPIDEM TARTRATE 5 MG PO TABS: 5.0000 mg | ORAL_TABLET | Freq: Every evening | ORAL | Status: DC | PRN

## 2021-06-20 MED ORDER — DEXMEDETOMIDINE HCL IN NACL 200 MCG/50ML IV SOLN
INTRAVENOUS | Status: DC | PRN
  Administered 2021-06-20: 12 ug via INTRAVENOUS

## 2021-06-20 MED ORDER — DIPHENHYDRAMINE HCL 12.5 MG/5ML PO ELIX: 12.5000 mg | ORAL_SOLUTION | ORAL | Status: DC | PRN

## 2021-06-20 MED ORDER — ENOXAPARIN SODIUM 40 MG/0.4ML IJ SOSY
40.0000 mg | PREFILLED_SYRINGE | INTRAMUSCULAR | Status: DC
  Administered 2021-06-21 – 2021-06-25 (×5): 40 mg via SUBCUTANEOUS
  Filled 2021-06-20 (×5): qty 0.4

## 2021-06-20 MED ORDER — MORPHINE SULFATE (PF) 2 MG/ML IV SOLN: 0.5000 mg | INTRAVENOUS | Status: DC | PRN

## 2021-06-20 MED ORDER — DEXAMETHASONE SODIUM PHOSPHATE 10 MG/ML IJ SOLN
INTRAMUSCULAR | Status: DC | PRN
  Administered 2021-06-20: 10 mg via INTRAVENOUS

## 2021-06-20 MED ORDER — PANTOPRAZOLE SODIUM 40 MG PO TBEC
40.0000 mg | DELAYED_RELEASE_TABLET | Freq: Every day | ORAL | Status: DC
  Administered 2021-06-21 – 2021-06-25 (×5): 40 mg via ORAL
  Filled 2021-06-20 (×5): qty 1

## 2021-06-20 MED ORDER — PRAVASTATIN SODIUM 20 MG PO TABS
80.0000 mg | ORAL_TABLET | Freq: Every day | ORAL | Status: DC
  Administered 2021-06-20 – 2021-06-24 (×5): 80 mg via ORAL
  Filled 2021-06-20 (×5): qty 4

## 2021-06-20 MED ORDER — POTASSIUM CHLORIDE CRYS ER 20 MEQ PO TBCR
20.0000 meq | EXTENDED_RELEASE_TABLET | Freq: Every evening | ORAL | Status: DC
  Administered 2021-06-21 – 2021-06-24 (×4): 20 meq via ORAL
  Filled 2021-06-20 (×4): qty 1

## 2021-06-20 MED ORDER — HYDROCODONE-ACETAMINOPHEN 5-325 MG PO TABS: 1.0000 | ORAL_TABLET | ORAL | Status: DC | PRN

## 2021-06-20 MED ORDER — ROCURONIUM BROMIDE 100 MG/10ML IV SOLN
INTRAVENOUS | Status: DC | PRN
  Administered 2021-06-20: 30 mg via INTRAVENOUS
  Administered 2021-06-20: 20 mg via INTRAVENOUS

## 2021-06-20 MED ORDER — PHENYLEPHRINE 40 MCG/ML (10ML) SYRINGE FOR IV PUSH (FOR BLOOD PRESSURE SUPPORT)
PREFILLED_SYRINGE | INTRAVENOUS | Status: AC
Start: 2021-06-20 — End: ?
  Filled 2021-06-20: qty 10

## 2021-06-20 MED ORDER — LIDOCAINE HCL (PF) 2 % IJ SOLN
INTRAMUSCULAR | Status: AC
  Filled 2021-06-20: qty 5

## 2021-06-20 MED ORDER — FAMOTIDINE 20 MG PO TABS
ORAL_TABLET | ORAL | Status: AC
  Administered 2021-06-20: 20 mg via ORAL
  Filled 2021-06-20: qty 1

## 2021-06-20 MED ORDER — FLEET ENEMA 7-19 GM/118ML RE ENEM: 1.0000 | ENEMA | Freq: Once | RECTAL | Status: DC | PRN

## 2021-06-20 MED ORDER — PROPOFOL 10 MG/ML IV BOLUS
INTRAVENOUS | Status: DC | PRN
  Administered 2021-06-20: 100 mg via INTRAVENOUS

## 2021-06-20 MED ORDER — FENTANYL CITRATE (PF) 100 MCG/2ML IJ SOLN
25.0000 ug | INTRAMUSCULAR | Status: DC | PRN
  Administered 2021-06-20 (×3): 25 ug via INTRAVENOUS

## 2021-06-20 MED ORDER — HYDROCODONE-ACETAMINOPHEN 7.5-325 MG PO TABS: 1.0000 | ORAL_TABLET | ORAL | Status: DC | PRN

## 2021-06-20 MED ORDER — DOCUSATE SODIUM 100 MG PO CAPS
100.0000 mg | ORAL_CAPSULE | Freq: Two times a day (BID) | ORAL | Status: DC
  Administered 2021-06-20 – 2021-06-25 (×10): 100 mg via ORAL
  Filled 2021-06-20 (×10): qty 1

## 2021-06-20 MED ORDER — PROPOFOL 10 MG/ML IV BOLUS
INTRAVENOUS | Status: AC
  Filled 2021-06-20: qty 20

## 2021-06-20 MED ORDER — FENTANYL CITRATE (PF) 100 MCG/2ML IJ SOLN
INTRAMUSCULAR | Status: AC
  Administered 2021-06-20: 25 ug via INTRAVENOUS
  Filled 2021-06-20: qty 2

## 2021-06-20 MED ORDER — ADULT MULTIVITAMIN W/MINERALS CH
1.0000 | ORAL_TABLET | Freq: Every day | ORAL | Status: DC
  Administered 2021-06-21 – 2021-06-25 (×5): 1 via ORAL
  Filled 2021-06-20 (×5): qty 1

## 2021-06-20 MED ORDER — MENTHOL 3 MG MT LOZG
1.0000 | LOZENGE | OROMUCOSAL | Status: DC | PRN
  Filled 2021-06-20: qty 9

## 2021-06-20 MED ORDER — FENTANYL CITRATE (PF) 100 MCG/2ML IJ SOLN
INTRAMUSCULAR | Status: AC
Start: 2021-06-20 — End: ?
  Filled 2021-06-20: qty 2

## 2021-06-20 MED ORDER — STERILE WATER FOR IRRIGATION IR SOLN
Status: DC | PRN
  Administered 2021-06-20: 1000 mL

## 2021-06-20 MED ORDER — BUPIVACAINE-EPINEPHRINE (PF) 0.25% -1:200000 IJ SOLN
INTRAMUSCULAR | Status: AC
  Filled 2021-06-20: qty 30

## 2021-06-20 MED ORDER — BISACODYL 10 MG RE SUPP
10.0000 mg | Freq: Every day | RECTAL | Status: DC | PRN
  Administered 2021-06-23 – 2021-06-25 (×2): 10 mg via RECTAL
  Filled 2021-06-20 (×2): qty 1

## 2021-06-20 MED ORDER — OYSTER SHELL CALCIUM/D3 500-5 MG-MCG PO TABS
1.0000 | ORAL_TABLET | Freq: Two times a day (BID) | ORAL | Status: DC
  Administered 2021-06-20 – 2021-06-25 (×10): 1 via ORAL
  Filled 2021-06-20 (×11): qty 1

## 2021-06-20 MED ORDER — LIDOCAINE HCL (CARDIAC) PF 100 MG/5ML IV SOSY
PREFILLED_SYRINGE | INTRAVENOUS | Status: DC | PRN
  Administered 2021-06-20: 60 mg via INTRAVENOUS

## 2021-06-20 MED ORDER — METOCLOPRAMIDE HCL 10 MG PO TABS: 5.0000 mg | ORAL_TABLET | Freq: Three times a day (TID) | ORAL | Status: DC | PRN

## 2021-06-20 MED ORDER — AMLODIPINE BESYLATE 5 MG PO TABS
2.5000 mg | ORAL_TABLET | ORAL | Status: DC
  Administered 2021-06-21 – 2021-06-25 (×5): 2.5 mg via ORAL
  Filled 2021-06-20 (×5): qty 1

## 2021-06-20 MED ORDER — VALSARTAN-HYDROCHLOROTHIAZIDE 160-12.5 MG PO TABS: 1.0000 | ORAL_TABLET | Freq: Every evening | ORAL | Status: DC

## 2021-06-20 MED ORDER — FUROSEMIDE 20 MG PO TABS: 20.0000 mg | ORAL_TABLET | Freq: Every day | ORAL | Status: DC | PRN

## 2021-06-20 MED ORDER — METHOCARBAMOL 1000 MG/10ML IJ SOLN
500.0000 mg | Freq: Four times a day (QID) | INTRAVENOUS | Status: DC | PRN
  Filled 2021-06-20: qty 5

## 2021-06-20 MED ORDER — ASPIRIN EC 81 MG PO TBEC
81.0000 mg | DELAYED_RELEASE_TABLET | Freq: Every day | ORAL | Status: DC
  Administered 2021-06-20 – 2021-06-24 (×5): 81 mg via ORAL
  Filled 2021-06-20 (×5): qty 1

## 2021-06-20 MED ORDER — PHENOL 1.4 % MT LIQD
1.0000 | OROMUCOSAL | Status: DC | PRN
  Filled 2021-06-20: qty 177

## 2021-06-20 MED ORDER — SODIUM CHLORIDE 0.9 % IR SOLN
Status: DC | PRN
  Administered 2021-06-20: 1004 mL

## 2021-06-20 MED ORDER — FENTANYL CITRATE (PF) 100 MCG/2ML IJ SOLN
INTRAMUSCULAR | Status: DC | PRN
  Administered 2021-06-20 (×2): 50 ug via INTRAVENOUS

## 2021-06-20 MED ORDER — TRAMADOL HCL 50 MG PO TABS
50.0000 mg | ORAL_TABLET | Freq: Four times a day (QID) | ORAL | Status: DC
  Administered 2021-06-21 – 2021-06-22 (×2): 50 mg via ORAL
  Filled 2021-06-20 (×2): qty 1

## 2021-06-20 MED ORDER — PHENYLEPHRINE 40 MCG/ML (10ML) SYRINGE FOR IV PUSH (FOR BLOOD PRESSURE SUPPORT)
PREFILLED_SYRINGE | INTRAVENOUS | Status: DC | PRN
  Administered 2021-06-20 (×2): 80 ug via INTRAVENOUS

## 2021-06-20 MED ORDER — ONDANSETRON HCL 4 MG PO TABS: 4.0000 mg | ORAL_TABLET | Freq: Four times a day (QID) | ORAL | Status: DC | PRN

## 2021-06-20 MED ORDER — CEFAZOLIN SODIUM-DEXTROSE 2-4 GM/100ML-% IV SOLN
2.0000 g | Freq: Four times a day (QID) | INTRAVENOUS | Status: AC
  Administered 2021-06-20 – 2021-06-21 (×2): 2 g via INTRAVENOUS
  Filled 2021-06-20 (×2): qty 100

## 2021-06-20 MED ORDER — PHENYLEPHRINE HCL-NACL 20-0.9 MG/250ML-% IV SOLN
INTRAVENOUS | Status: DC | PRN
  Administered 2021-06-20: 30 ug/min via INTRAVENOUS

## 2021-06-20 MED ORDER — ONDANSETRON HCL 4 MG/2ML IJ SOLN: 4.0000 mg | Freq: Once | INTRAMUSCULAR | Status: DC | PRN

## 2021-06-20 SURGICAL SUPPLY — 56 items
BLADE SAGITTAL AGGR TOOTH XLG (BLADE) ×2 IMPLANT
BNDG COHESIVE 6X5 TAN ST LF (GAUZE/BANDAGES/DRESSINGS) ×6 IMPLANT
BOWL CEMENT MIXING ADV NOZZLE (MISCELLANEOUS) ×1 IMPLANT
CANISTER WOUND CARE 500ML ATS (WOUND CARE) ×2 IMPLANT
CEMENT BONE 40GM (Cement) ×2 IMPLANT
CEMENT RESTRICTOR DEPUY SZ 3 (Cement) ×1 IMPLANT
CHLORAPREP W/TINT 26 (MISCELLANEOUS) ×2 IMPLANT
COVER BACK TABLE REUSABLE LG (DRAPES) ×2 IMPLANT
DRAPE 3/4 80X56 (DRAPES) ×6 IMPLANT
DRAPE C-ARM XRAY 36X54 (DRAPES) ×2 IMPLANT
DRAPE INCISE IOBAN 66X60 STRL (DRAPES) IMPLANT
DRAPE POUCH INSTRU U-SHP 10X18 (DRAPES) ×2 IMPLANT
DRESSING SURGICEL FIBRLLR 1X2 (HEMOSTASIS) ×2 IMPLANT
DRSG MEPILEX SACRM 8.7X9.8 (GAUZE/BANDAGES/DRESSINGS) ×2 IMPLANT
DRSG SURGICEL FIBRILLAR 1X2 (HEMOSTASIS) ×4
ELECT BLADE 6.5 EXT (BLADE) ×2 IMPLANT
ELECT REM PT RETURN 9FT ADLT (ELECTROSURGICAL) ×2
ELECTRODE REM PT RTRN 9FT ADLT (ELECTROSURGICAL) ×1 IMPLANT
GLOVE SURG SYN 9.0  PF PI (GLOVE) ×2
GLOVE SURG SYN 9.0 PF PI (GLOVE) ×2 IMPLANT
GLOVE SURG UNDER POLY LF SZ9 (GLOVE) ×2 IMPLANT
GOWN SRG 2XL LVL 4 RGLN SLV (GOWNS) ×1 IMPLANT
GOWN STRL NON-REIN 2XL LVL4 (GOWNS) ×1
GOWN STRL REUS W/ TWL LRG LVL3 (GOWN DISPOSABLE) ×1 IMPLANT
GOWN STRL REUS W/TWL LRG LVL3 (GOWN DISPOSABLE) ×1
HIP FEM HD S 28 (Head) ×1 IMPLANT
HIP STEM FEM 3 STD (Stem) ×1 IMPLANT
HOLDER FOLEY CATH W/STRAP (MISCELLANEOUS) ×2 IMPLANT
KIT PREVENA INCISION MGT 13 (CANNISTER) ×2 IMPLANT
LINER DBL MOB SZ 0 52MM (Liner) ×1 IMPLANT
MANIFOLD NEPTUNE II (INSTRUMENTS) ×2 IMPLANT
MAT ABSORB  FLUID 56X50 GRAY (MISCELLANEOUS) ×1
MAT ABSORB FLUID 56X50 GRAY (MISCELLANEOUS) ×1 IMPLANT
NDL SPNL 20GX3.5 QUINCKE YW (NEEDLE) ×2 IMPLANT
NEEDLE SPNL 20GX3.5 QUINCKE YW (NEEDLE) ×4 IMPLANT
NOZZLE FLEX THIN (MISCELLANEOUS) ×1 IMPLANT
NS IRRIG 1000ML POUR BTL (IV SOLUTION) ×2 IMPLANT
PACK HIP COMPR (MISCELLANEOUS) ×2 IMPLANT
PRESSURIZER CEMENT PROX FEM SM (MISCELLANEOUS) ×1 IMPLANT
SCALPEL PROTECTED #10 DISP (BLADE) ×4 IMPLANT
SHELL ACETABULAR SZ 52 DM (Shell) ×1 IMPLANT
STAPLER SKIN PROX 35W (STAPLE) ×2 IMPLANT
STRAP SAFETY 5IN WIDE (MISCELLANEOUS) ×2 IMPLANT
SUT DVC 2 QUILL PDO  T11 36X36 (SUTURE) ×1
SUT DVC 2 QUILL PDO T11 36X36 (SUTURE) ×1 IMPLANT
SUT SILK 0 (SUTURE) ×1
SUT SILK 0 30XBRD TIE 6 (SUTURE) ×1 IMPLANT
SUT V-LOC 90 ABS DVC 3-0 CL (SUTURE) ×2 IMPLANT
SUT VIC AB 1 CT1 36 (SUTURE) ×2 IMPLANT
SYR 20ML LL LF (SYRINGE) ×2 IMPLANT
SYR 30ML LL (SYRINGE) ×2 IMPLANT
SYR 50ML LL SCALE MARK (SYRINGE) ×4 IMPLANT
SYR BULB IRRIG 60ML STRL (SYRINGE) ×2 IMPLANT
TAPE MICROFOAM 4IN (TAPE) ×2 IMPLANT
TOWEL OR 17X26 4PK STRL BLUE (TOWEL DISPOSABLE) IMPLANT
TRAY FOLEY MTR SLVR 16FR STAT (SET/KITS/TRAYS/PACK) ×2 IMPLANT

## 2021-06-20 NOTE — Anesthesia Procedure Notes (Signed)
Procedure Name: Intubation ?Date/Time: 06/20/2021 12:24 PM ?Performed by: Jonna Clark, CRNA ?Pre-anesthesia Checklist: Patient identified, Patient being monitored, Timeout performed, Emergency Drugs available and Suction available ?Patient Re-evaluated:Patient Re-evaluated prior to induction ?Oxygen Delivery Method: Circle system utilized ?Preoxygenation: Pre-oxygenation with 100% oxygen ?Induction Type: IV induction ?Ventilation: Mask ventilation without difficulty ?Laryngoscope Size: 3 and McGraph ?Grade View: Grade I ?Tube type: Oral ?Tube size: 6.5 mm ?Number of attempts: 1 ?Airway Equipment and Method: Stylet ?Placement Confirmation: ETT inserted through vocal cords under direct vision, positive ETCO2 and breath sounds checked- equal and bilateral ?Secured at: 21 cm ?Tube secured with: Tape ?Dental Injury: Teeth and Oropharynx as per pre-operative assessment  ? ? ? ? ?

## 2021-06-20 NOTE — Op Note (Signed)
06/20/2021 ? ?2:35 PM ? ?PATIENT:  Allison Mclean  86 y.o. female ? ?PRE-OPERATIVE DIAGNOSIS:  Primary osteoarthritis of right hip  M16.11 ? ?POST-OPERATIVE DIAGNOSIS:  Primary osteoarthritis of right hip  M16.11 ? ?PROCEDURE:  Procedure(s): ?TOTAL HIP ARTHROPLASTY ANTERIOR APPROACH (Right) ? ?SURGEON: Laurene Footman, MD ? ?ASSISTANTS: none ? ?ANESTHESIA:   general ? ?EBL:  Total I/O ?In: 650 [I.V.:450; IV Piggyback:200] ?Out: 550 [Urine:500; Blood:50] ? ?BLOOD ADMINISTERED:none ? ?DRAINS:  Incisional wound VAC   ? ?LOCAL MEDICATIONS USED:  MARCAINE    and OTHER Exparel ? ?SPECIMEN: Right femoral head ? ?DISPOSITION OF SPECIMEN:  PATHOLOGY ? ?COUNTS:  YES ? ?TOURNIQUET:  * No tourniquets in log * ? ?IMPLANTS: Medacta 3 stem AMIS cemented, 52 mm Mpact DM cup and liner with metal S 28 mm head ? ?DICTATION: .Dragon Dictation   The patient was brought to the operating room and after general anesthesia was obtained patient was placed on the operative table with the ipsilateral foot into the Medacta attachment, contralateral leg on a well-padded table. C-arm was brought in and preop template x-ray taken. After prepping and draping in usual sterile fashion appropriate patient identification and timeout procedures were completed. Anterior approach to the hip was obtained and centered over the greater trochanter and TFL muscle. The subcutaneous tissue was incised hemostasis being achieved by electrocautery. TFL fascia was incised and the muscle retracted laterally deep retractor placed. The lateral femoral circumflex vessels were identified and ligated. The anterior capsule was exposed and a capsulotomy performed. The neck was identified and a femoral neck cut carried out with a saw. The head was removed without difficulty and showed sclerotic femoral head and acetabulum. Reaming was carried out to 52 mm and a 52 mm cup trial gave appropriate tightness to the acetabular component a 52 DM cup was impacted into position. The leg  was then externally rotated and ischiofemoral and pubofemoral releases carried out. The femur was sequentially broached to a size 4, with a cement restrictor placed in the canal dried 3 cemented stem was inserted with pressurization of the canal.  Excess cement was removed and after cement was set trial was performed and a metal S 28 mm head and 52 mm liner. The hip was reduced and was stable the wound was thoroughly irrigated with fibrillar placed along the posterior capsule and medial neck. The deep fascia ws closed using a heavy Quill after infiltration of 30 cc of quarter percent Sensorcaine with epinephrine diluted with Exparel .3-0 V-loc to close the skin with skin staples.  Incisional wound VAC applied patient was sent to recovery in stable condition.  ? ?PLAN OF CARE: Admit for overnight observation  ?

## 2021-06-20 NOTE — H&P (Signed)
?Chief Complaint  ?Patient presents with  ? Right Hip - Follow-up  ?H&P- Right THA- 06/20/21  ? ? ?History of the Present Illness: ?Allison Mclean is a 86 y.o. female here today for discussion of a right total hip arthroplasty. Date of surgery is scheduled for Friday, 06/20/2021. She had a right hip injection on 05/22/2021 with local only, no steroid. The patient is accompanied by her son, Allison Mclean. ? ?The patient states her right hip is "awful." She states the injection done by Dr. Candelaria Stagers helped for a few hours. She states she can walk well if she gets in a store. ? ?The patient has paroxysmal SVT, cardiomyopathy, stage 2 to 3 heart failure. Her cardiologist is Dr. Vertell Limber, but she sees Dr. Saralyn Pilar every 3 to 4 months. Dr. Rayann Heman put her on a defibrillator a few years ago. She had back surgery from T6 to sacrum in 2018 for scoliosis and stenosis, where 32 screws were placed. Franco Collet is her neurosurgeon. She had injections in her back for a long time and then had to undergo surgery because injections stopped working.  ? ?I have reviewed past medical, surgical, social and family history, and allergies as documented in the EMR. ? ?Past Medical History: ?Past Medical History:  ?Diagnosis Date  ? Allergic state  ?Environmental  ? Arrhythmia  ? Arthritis  ? Cataract cortical, senile  ? Chronic cough  ? GERD (gastroesophageal reflux disease)  ?resolved with Nissen procedure  ? History of blood transfusion  ? History of nephrolithiasis  ? History of shingles  ? Hx of myocardial infarction  ? Hyperlipidemia  ? Hypertension  ? IBS (irritable bowel syndrome)  ? Obesity  ? Osteoporosis, post-menopausal  ? Presence of automatic implantable cardioverter-defibrillator  ? SVT (supraventricular tachycardia) (CMS-HCC)  ? ?Past Surgical History: ?Past Surgical History:  ?Procedure Laterality Date  ? HEMORRHOIDECTOMY EXTERNAL 09/28/01  ?Dr. Rochel Brome  ? EGD 02/01/2002  ? POSTERIOR LAMINECTOMY / DECOMPRESSION LUMBAR SPINE  09/11/03  ?L4-5 lumbar decompression and fusion  ? LAPAROSCOPIC ESOPHAGOGASTRIC FUNDOPLASTY (NISSEN PROCEDURE) 2007  ? ANTERIOR FUSION CERVICAL SPINE 07/16/08  ?Diskectomy anterior complete with removal of posterior osteophytes at C6-7. Insertion of interbody device, BAK-C 10 mm at C5-6. Insertion of interbody device, BAK-C, 12 mm at C6-7.  ? COLONOSCOPY 10/27/2011  ?Adenomatous Polyp: CBF 10/2016; Recall Ltr mailed 09/18/2016 (dw)  ? ESOPHAGOGASTRODOUDENOSCOPY W/BIOPSY N/A 03/13/2016  ?Procedure: ESOPHAGOGASTRODUODENOSCOPY, FLEXIBLE, TRANSORAL; WITH BIOPSY, SINGLE OR MULTIPLE; Surgeon: Vernie Ammons, MD; Location: [place]; Service: Gastroenterology; Laterality: N/A;  ? ENDOSCOPIC ULTRASOUND N/A 03/13/2016  ?Procedure: ENDOSCOPIC ULTRASOUND; Surgeon: Vernie Ammons, MD; Location: [place]; Service: Gastroenterology; Laterality: N/A;  ? BLEPHAROPLASTY UPPER EYELID Bilateral 06/18/2020  ?Procedure: BILATERAL UPPER EYELID BLEPHAROPLASTY; Surgeon: Lucilla Lame, MD; Location: [place]; Service: Ophthalmology; Laterality: Bilateral;  ? ANTERIOR FUSION CERVICAL SPINE  ?Diskectomy anterior with removal of posterior osteophytes C5-6.  ? APPENDECTOMY  ? CARPAL TUNNEL RELEASE  ? CHOLECYSTECTOMY  ? HYSTERECTOMY  ? OOPHORECTOMY  ? TONSILLECTOMY  ? ?Past Family History: ?Family History  ?Problem Relation Age of Onset  ? Myocardial Infarction (Heart attack) Mother  ? High blood pressure (Hypertension) Mother  ? Diabetes type II Mother  ? Coronary Artery Disease (Blocked arteries around heart) Mother  ? Myocardial Infarction (Heart attack) Father  ? High blood pressure (Hypertension) Father  ? Prostate cancer Father  ? Diabetes type II Father  ? Stroke Father  ? Asthma Father  ? Cancer Father  ? Coronary Artery  Disease (Blocked arteries around heart) Father  ? Diabetes type II Sister  ? Cancer Sister  ? Coronary Artery Disease (Blocked arteries around heart) Sister  ? Diabetes  Sister  ? Stroke Sister  ? Coronary Artery Disease (Blocked arteries around heart) Brother  ? Anesthesia problems Neg Hx  ? Malignant hypertension Neg Hx  ? ?Medications: ?Current Outpatient Medications Ordered in Epic  ?Medication Sig Dispense Refill  ? amLODIPine (NORVASC) 2.5 MG tablet TAKE 1 TABLET BY MOUTH EVERY DAY 90 tablet 3  ? aspirin 81 MG EC tablet Take 81 mg by mouth every morning  ? calcium citrate-vitamin D3 (CITRACAL+D) 315-200 mg-unit tablet Take 2 tablets by mouth every morning  ? carboxymethylcellulose (REFRESH PLUS) 0.5 % ophthalmic solution Apply 1 drop to eye 3 (three) times daily  ? fluticasone propionate (FLONASE) 50 mcg/actuation nasal spray SPRAY 2 SPRAYS INTO EACH NOSTRIL EVERY DAY 48 g 1  ? FUROsemide (LASIX) 20 MG tablet Take 1 tablet (20 mg total) by mouth once daily as needed for Edema 90 tablet 1  ? KLOR-CON M20 20 mEq ER tablet TAKE 1 TABLET BY MOUTH ONCE DAILY 90 tablet 3  ? nystatin (MYCOSTATIN) 100,000 unit/gram powder Apply topically 2 (two) times daily 60 g 5  ? pravastatin (PRAVACHOL) 80 MG tablet Take 1 tablet (80 mg total) by mouth at bedtime 90 tablet 3  ? predniSONE (DELTASONE) 10 MG tablet Taper 6-6-4-4-2-2-1-1-off 26 tablet 0  ? valsartan-hydrochlorothiazide (DIOVAN-HCT) 160-12.5 mg tablet Take 1 tablet by mouth once daily 90 tablet 2  ? ?No current Epic-ordered facility-administered medications on file.  ? ?Allergies: ?Allergies  ?Allergen Reactions  ? Atorvastatin Other (See Comments)  ?Chest pain  ? Crestor [Rosuvastatin] Other (See Comments)  ?Causes chest pain  ? ? ?Body mass index is 35.39 kg/m?. ? ?Review of Systems: ?A comprehensive 14 point ROS was performed, reviewed, and the pertinent orthopaedic findings are documented in the HPI. ? ?Vitals:  ?06/16/21 1031  ?BP: 138/70  ? ? ?General Physical Examination:  ?General/Constitutional: No apparent distress: well-nourished and well developed. ?Eyes: Pupils equal, round with synchronous movement. ?Lungs: Clear to  auscultation ?HEENT: Normal ?Vascular: No edema, swelling or tenderness, except as noted in detailed exam. ?Cardiac: Heart rate and rhythm is regular. ?Integumentary: No impressive skin lesions present, except as noted in detailed exam. ?Neuro/Psych: Normal mood and affect, oriented to person, place and time. ? ?On exam, good right hip strength. Right hip has 0 degrees internal rotation and 30 degrees external. Antalgic gait. ? ?Assessment: ?ICD-10-CM  ?1. Primary osteoarthritis of right hip M16.11  ? ?The patient has clinical findings of severe right hip osteoarthritis. ? ?Plan: ?We discussed the patient's prior x-ray findings. I explained she has severe right hip osteoarthritis. I recommend right total hip arthroplasty. I explained the surgery and postoperative course in detail. I recommend home health for 2 weeks after surgery. ? ?We will schedule the patient for right total hip arthroplasty on Friday, 06/20/2021. ? ?Surgical Risks: ? ?The nature of the condition and the proposed procedure has been reviewed in detail with the patient. Surgical versus non-surgical options and prognosis for recovery have been reviewed and the inherent risks and benefits of each have been discussed including the risks of infection, bleeding, injury to nerves/blood vessels/tendons, incomplete relief of symptoms, persisting pain and/or stiffness, loss of function, complex regional pain syndrome, failure of the procedure, as appropriate. ? ?I, Sowjanya Panditi, Quality Documentation Specialist, completed documentation using DAX technology. ? ?Electronically signed by Lauris Poag,  MD at 06/16/2021 3:15 PM EDT ? ?Reviewed  H+P. ?No changes noted. ? ?

## 2021-06-20 NOTE — Anesthesia Preprocedure Evaluation (Signed)
Anesthesia Evaluation  ?Patient identified by MRN, date of birth, ID band ?Patient awake ? ? ? ?Reviewed: ?Allergy & Precautions, H&P , NPO status , Patient's Chart, lab work & pertinent test results, reviewed documented beta blocker date and time  ? ?Airway ?Mallampati: III ? ?TM Distance: <3 FB ?Neck ROM: full ? ? ? Dental ? ?(+) Teeth Intact ?  ?Pulmonary ?neg pulmonary ROS,  ?  ?Pulmonary exam normal ? ? ? ? ? ? ? Cardiovascular ?Exercise Tolerance: Poor ?hypertension, On Medications ?+ CAD and + DOE  ?Ventricular Tachycardia + Cardiac Defibrillator ? ?Rhythm:regular Rate:Normal ? ?Clearance per [place]. ja ?  ?Neuro/Psych ?Previous back surgery and rods. ja ? Neuromuscular disease negative psych ROS  ? GI/Hepatic ?Neg liver ROS, hiatal hernia, GERD  Medicated,  ?Endo/Other  ?negative endocrine ROS ? Renal/GU ?negative Renal ROS  ?negative genitourinary ?  ?Musculoskeletal ? ? Abdominal ?  ?Peds ? 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    Comment:  a.) [place] DR model [id number]  ?             (serial  Number [id number]) ICD ?No date: Coronary artery disease ?    Comment:  a.) LHC 02/07/2015: EF 45-50; global HK; normal coronary ?             anatomy; no CAD. b.) LHC 12/21/2017: DCM with EF 29%; 20% ?             p-m RCA, 20% mLAD; no interventions. ?No date: Cortical senile cataract ?No date: Dilated cardiomyopathy ([place]) ?    Comment:  a.) TTE 01/26/2014: EF 50%. b.) TTE 09/16/2015: EF 40%.  ?             c.) TTE 05/14/2017: EF 30%. d.) TTE 06/28/2018: EF 30%. ?No date: DOE (dyspnea on exertion) ?No date: GERD (gastroesophageal reflux disease) ?    Comment:  a.) resolved s/p Nissen fundoplication ?No date: HFrEF (heart failure with reduced ejection fraction) ([place]) ?    Comment:  a.) TTE 01/23/2014: EF 50%;  global HK, mild LA dilation, ?             mild LVH; triv MR, mild AR/TR/PR. b.) TTE 09/16/2015: EF  ?             40%; mild LVH; mild LA dilation; global HK; mild  ?             panvalvular regurgitation. c.) TTE 05/14/2017: EF 30%;  ?             mod global HK, mild LA dilation, G1DD, triv AR/MR/PR,  ?             mild MR. d.) TTE 06/28/2018: EF 30%; mod HK; triv AR/PR,  ?             mild MR/TR; G1DD. ?No date: History of hiatal hernia ?    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    Comment:  a.) s/p AICD placement ?07/02/2015: Thoracic central spinal stenosis ?07/02/2015: Thoracic facet syndrome (Left) ?  07/02/2015: Thoracic foraminal stenosis (Moderate to severe at: Right  ?T5; Bilateral T9; Left T11) ?07/09/2015: Thoracic spondylosis with radiculopathy (Left) ?No date: Wears dentures ?    Comment:  partial lower ?Past Surgical History: ?06/02/2016: ABDOMINAL EXPOSURE; N/A ?    Comment:  Procedure: ABDOMINAL EXPOSURE;  Surgeon: Judeth Cornfield  ?             Scot Dock, MD;  Location: [place];  Service: Vascular;   ?             Laterality: N/A; ?No date: ABDOMINAL HYSTERECTOMY ?06/02/2016: ANTERIOR LAT LUMBAR FUSION; N/A ?    Comment:  Procedure: L1-2 Left Anterior lateral lumbar interbody  ?             fusion;  Surgeon: Erline Levine, MD;  Location: [place];   ?             Service: Neurosurgery;  Laterality: N/A;  L1-2 Anterior  ?             lateral lumbar interbody fusion ?06/02/2016: ANTERIOR LUMBAR FUSION; N/A ?    Comment:  Procedure: Lumbar five -sacral one Anterior lumbar  ?             interbody  fusion with Dr. Deitra Mayo; Lumbar  ?             one-two Left Anterior lateral lumbar interbody fusion;   ?             Surgeon: Erline Levine, MD;  Location: [place];  Service:  ?             Neurosurgery;  Laterality: N/A; ?No date: APPENDECTOMY ?19/50/9326: APPLICATION OF INTRAOPERATIVE CT SCAN; N/A ?    Comment:  Procedure: APPLICATION OF INTRAOPERATIVE CT SCAN;   ?             Surgeon: Erline Levine, MD;  Location: [place];  Service:  ?             Neurosurgery;  Laterality: N/A; ?No date: BACK SURGERY ?    Comment:  2005, 2006, 2015 ?No date: BLEPHAROPLASTY; Bilateral ?02/07/2015: CARDIAC CATHETERIZATION; N/A ?    Comment:  Procedure: Left Heart Cath and Coronary Angiography;   ?             Surgeon: Teodoro Spray, MD;  Location: [place] INVASIVE CV  ?             LAB;  Service: Cardiovascular;  Laterality: N/A; ?No date: CHOLECYSTECTOMY ?02/03/2016: ESOPHAGOGASTRODUODENOSCOPY (EGD) WITH PROPOFOL; N/A ?    Comment:  Procedure: ESOPHAGOGASTRODUODENOSCOPY (EGD) WITH  ?             PROPOFOL;  Surgeon: Lucilla Lame, MD;  Location: [place]  ?             SURGERY CNTR;  Service: Endoscopy;  Laterality: N/A; ?03/02/2016: ESOPHAGOGASTRODUODENOSCOPY (EGD) WITH PROPOFOL; N/A ?    Comment:  Procedure: ESOPHAGOGASTRODUODENOSCOPY (EGD) WITH  ?             PROPOFOL;  Surgeon: Lucilla Lame, MD;  Location: [facility]  ?             SURGERY CNTR;  Service: Endoscopy;  Laterality: N/A; ?03/08/2018: ICD IMPLANT; N/A ?    Comment:  [place] DR model [id number]  ?             (serial  Number [id number]) ICD by Dr Rayann Heman for treatment  ?  of syncope and reduced EF.  R sided implanted due to  ?             persistent L SVC ?12/21/2017: LEFT HEART CATH AND CORONARY ANGIOGRAPHY; Left ?    Comment:  Procedure: LEFT HEART CATH AND CORONARY ANGIOGRAPHY;   ?             Surgeon: Isaias Cowman, MD;  Location: [place]  ?             INVASIVE CV LAB;  Service: Cardiovascular;  Laterality:  ?              Left; ?02/11/2011: LEFT HEART CATH AND CORONARY ANGIOGRAPHY; Left ?    Comment:  Procedure: LEFT HEART CATH AND CORONARY ANGIOGRAPHY;  ?             Location: [place]; Surgeon: Bartholome Bill, MD ?No date: NISSEN FUNDOPLICATION ?28/63/8177: POSTERIOR LUMBAR FUSION 4 LEVEL; N/A ?    Comment:  Procedure: Thoracic six to pelvis fixation with  ?             Smith-Peterson Thoracic osteotomies at Thoracic  ?             ten-Thoracic Twelve with AIRO, Removal hardware lumbar  ?             two to lumbar five;  Surgeon: Erline Levine, MD;   ?             Location: [place];  Service: Neurosurgery;  Laterality:  ?             N/A; ?2018: SPINAL GROWTH RODS; Bilateral ?No date: SYMPATHECTOMY ?    Comment:  ? improve blood flow to legs ?No date: TONSILECTOMY, ADENOIDECTOMY, BILATERAL MYRINGOTOMY AND TUBES ?BMI   ? Body Mass Index: 35.13 kg/m?  ?  ? Reproductive/Obstetrics ?negative OB ROS ? ?  ? ? ? ? ? ? ? ? ? ? ? ? ? ?  ?  ? ? ? ? ? ? ? ? ?Anesthesia Physical ?Anesthesia Plan ? ?ASA: 3 ? ?Anesthesia Plan: General ETT  ? ?Post-op Pain Management:   ? ?Induction:  ? ?PONV Risk Score and Plan: 4 or greater ? ?Airway Management Planned:  ? ?Additional Equipment:  ? ?Intra-op Plan:  ? ?Post-operative Plan:  ? ?Informed Consent: I have reviewed the patients History and Physical, chart, labs and discussed the procedure including the risks, benefits and alternatives for the proposed anesthesia with the patient or authorized representative who has indicated his/her understanding and acceptance.  ? ? ? ?Dental Advisory Given ? ?Plan Discussed with: CRNA ? ?Anesthesia Plan Comments:   ? ? ? ? ? ? ?Anesthesia Quick Evaluation ? ?

## 2021-06-20 NOTE — Transfer of Care (Signed)
Immediate Anesthesia Transfer of Care Note ? ?Patient: Allison Mclean ? ?Procedure(s) Performed: TOTAL HIP ARTHROPLASTY ANTERIOR APPROACH (Right: Hip) ? ?Patient Location: PACU ? ?Anesthesia Type:General ? ?Level of Consciousness: drowsy and patient cooperative ? ?Airway & Oxygen Therapy: Patient Spontanous Breathing and Patient connected to nasal cannula oxygen ? ?Post-op Assessment: Report given to RN and Post -op Vital signs reviewed and stable ? ?Post vital signs: Reviewed and stable ? ?Last Vitals:  ?Vitals Value Taken Time  ?BP 160/77 06/20/21 1420  ?Temp 36.3 ?C 06/20/21 1420  ?Pulse 82 06/20/21 1426  ?Resp 14 06/20/21 1426  ?SpO2 100 % 06/20/21 1426  ?Vitals shown include unvalidated device data. ? ?Last Pain:  ?Vitals:  ? 06/20/21 1036  ?TempSrc: Temporal  ?PainSc: 7   ?   ? ?  ? ?Complications: No notable events documented. ?

## 2021-06-20 NOTE — Evaluation (Signed)
Physical Therapy Evaluation ?Patient Details ?Name: Allison Mclean ?MRN: [id number] ?DOB: [birth year] ?Today's Date: 06/20/2021 ? ?History of Present Illness ? Pt is an 86 y.o. female s/p R THA anterior approach secondary to primary OA 06/20/21.  PMH includes: CAD, HFrEF, NSVT, dilated cardiomyopathy (s/p AICD placement), sick sinus syndrome, HTN, HLD, DOE, GERD/hiatal hernia (status post Niesen fundoplication), anterior fusion c-spine, OA, thoracic spinal stenosis and facet syndrome (s/p fusion).  ?Clinical Impression ? Prior to surgery, pt was modified independent with functional mobility (use of SPC vs RW depending on how pt was feeling); lives alone in 1 level home with ramp to enter.  Pt received nausea medication prior to session and pt reporting wanting to participate in therapy and try to get up to recliner.  4/10 R hip pain during session.  Performed LE ex's in bed (ankle pumps, quad sets, and x5 AAROM R LE heel-slides) before pt became very nauseas (deferred further activity d/t pt's nausea limiting any movement/activity; pt also appearing drowsy); cool wash cloth initially applied to pt's forehead and pt given emesis basin but then pt reporting being really cold so cool wash cloth removed and pt covered in blankets and pt reporting feeling more comfortable; nurse notified of pt's status and symptoms.  Pt would benefit from skilled PT to address noted impairments and functional limitations (see below for any additional details).  Upon hospital discharge, anticipate pt would benefit from SNF.   ? ?Recommendations for follow up therapy are one component of a multi-disciplinary discharge planning process, led by the attending physician.  Recommendations may be updated based on patient status, additional functional criteria and insurance authorization. ? ?Follow Up Recommendations Skilled nursing-short term rehab (<3 hours/day) ? ?  ?Assistance Recommended at Discharge Frequent or constant Supervision/Assistance   ?Patient can return home with the following ? A lot of help with walking and/or transfers;A lot of help with bathing/dressing/bathroom;Assistance with cooking/housework;Assist for transportation;Help with stairs or ramp for entrance ? ?  ?Equipment Recommendations Rolling walker (2 wheels);BSC/3in1  ?Recommendations for Other Services ? OT consult  ?  ?Functional Status Assessment Patient has had a recent decline in their functional status and demonstrates the ability to make significant improvements in function in a reasonable and predictable amount of time.  ? ?  ?Precautions / Restrictions Precautions ?Precautions: Anterior Hip;Fall ?Precaution Booklet Issued: Yes (comment) ?Precaution Comments: wound vac ?Restrictions ?Weight Bearing Restrictions: Yes ?RLE Weight Bearing: Weight bearing as tolerated  ? ?  ? ?Mobility ? Bed Mobility ?  ?  ?  ?  ?  ?  ?  ?General bed mobility comments: Deferred d/t pt very nauseas with limited LE ex's in bed ?  ? ?Transfers ?  ?  ?  ?  ?  ?  ?  ?  ?  ?  ?  ? ?Ambulation/Gait ?  ?  ?  ?  ?  ?  ?  ?  ? ?Stairs ?  ?  ?  ?  ?  ? ?Wheelchair Mobility ?  ? ?Modified Rankin (Stroke Patients Only) ?  ? ?  ? ?Balance   ?  ?  ?  ?  ?  ?  ?  ?  ?  ?  ?  ?  ?  ?  ?  ?  ?  ?  ?   ? ? ? ?Pertinent Vitals/Pain Pain Assessment ?Pain Assessment: 0-10 ?Pain Score: 4  ?Pain Location: R hip ?Pain Descriptors / Indicators: Sore ?Pain Intervention(s): Limited activity within  patient's tolerance, Monitored during session, Premedicated before session, Repositioned, Ice applied ?Vitals (HR and O2 on 2 L via nasal cannula) stable and WFL throughout treatment session.  ? ? ?Home Living Family/patient expects to be discharged to:: Private residence ?Living Arrangements: Alone ?  ?Type of Home: House ?Home Access: Ramped entrance ?  ?  ?  ?Home Layout: One level ?Home Equipment: Rolling Walker (2 wheels);Grab bars - tub/shower;Shower seat - built in ?   ?  ?Prior Function Prior Level of Function :  Independent/Modified Independent ?  ?  ?  ?  ?  ?  ?Mobility Comments: Ambulatory with SPC vs RW depending on how she is feeling. ?  ?  ? ? ?Hand Dominance  ?   ? ?  ?Extremity/Trunk Assessment  ? Upper Extremity Assessment ?Upper Extremity Assessment: Overall WFL for tasks assessed ?  ? ?Lower Extremity Assessment ?Lower Extremity Assessment: RLE deficits/detail;LLE deficits/detail ?RLE Deficits / Details: at least 3/5 AROM ankle DF/PF; fair R quad set isometric strength; at least 2+/5 hip flexion ?LLE Deficits / Details: at least 3/5 AROM ankle DF/PF and good L quad set isometric strength; deferred L hip flexion and knee flexion/extension testing d/t nausea ?  ? ?   ?Communication  ? Communication: HOH (does not have hearing aides in hospital)  ?Cognition Arousal/Alertness:  (pt appearing drowsy but opens eyes with vc's) ?Behavior During Therapy: Flat affect ?Overall Cognitive Status:  (Oriented to at least person, place, and general situation) ?  ?  ?  ?  ?  ?  ?  ?  ?  ?  ?  ?  ?  ?  ?  ?  ?General Comments: Increased time to respond at times (d/t drowsiness) ?  ?  ? ?  ?General Comments General comments (skin integrity, edema, etc.): wound vac intact.  Nursing cleared pt for participation in physical therapy.  Pt agreeable to PT session.  Pt's 2 sons present during session. ? ?  ?Exercises Total Joint Exercises ?Ankle Circles/Pumps: AROM, Strengthening, Both, 10 reps, Supine ?Quad Sets: AROM, Strengthening, Both, 10 reps, Supine ?Heel Slides: AAROM, Strengthening, Right, 5 reps, Supine (limited d/t pt nausea)  ? ?Assessment/Plan  ?  ?PT Assessment Patient needs continued PT services  ?PT Problem List Decreased strength;Decreased range of motion;Decreased activity tolerance;Decreased balance;Decreased mobility;Decreased knowledge of use of DME;Decreased knowledge of precautions;Pain;Decreased skin integrity ? ?   ?  ?PT Treatment Interventions DME instruction;Gait training;Functional mobility  training;Therapeutic activities;Therapeutic exercise;Balance training;Patient/family education   ? ?PT Goals (Current goals can be found in the Care Plan section)  ?Acute Rehab PT Goals ?Patient Stated Goal: to improve mobility ?PT Goal Formulation: With patient ?Time For Goal Achievement: 07/04/21 ?Potential to Achieve Goals: Good ? ?  ?Frequency BID ?  ? ? ?Co-evaluation   ?  ?  ?  ?  ? ? ?  ?AM-PAC PT "6 Clicks" Mobility  ?Outcome Measure Help needed turning from your back to your side while in a flat bed without using bedrails?: A Little ?Help needed moving from lying on your back to sitting on the side of a flat bed without using bedrails?: A Lot ?Help needed moving to and from a bed to a chair (including a wheelchair)?: A Lot ?Help needed standing up from a chair using your arms (e.g., wheelchair or bedside chair)?: A Lot ?Help needed to walk in hospital room?: Total ?Help needed climbing 3-5 steps with a railing? : Total ?6 Click Score: 11 ? ?  ?End of  Session Equipment Utilized During Treatment: Oxygen ?Activity Tolerance: Other (comment) (Limited d/t nausea) ?Patient left: in bed;with call bell/phone within reach;with bed alarm set;with family/visitor present;with SCD's reapplied;Other (comment) (B heels floating via towel rolls) ?Nurse Communication: Mobility status;Precautions;Other (comment) (pt's nausea) ?PT Visit Diagnosis: Other abnormalities of gait and mobility (R26.89);Muscle weakness (generalized) (M62.81);Pain ?Pain - Right/Left: Right ?Pain - part of body: Hip ?  ? ?Time: 8811-0315 ?PT Time Calculation (min) (ACUTE ONLY): 22 min ? ? ?Charges:   PT Evaluation ?$PT Eval Low Complexity: 1 Low ?  ?  ?   ? ?Leitha Bleak, PT ?06/20/21, 5:13 PM ? ? ?

## 2021-06-20 NOTE — Progress Notes (Signed)
1610 ?Assessment Completed. Ax4. Lungs clear on 2L [place]. Pt can reach 750 on IS. 22G IV to L wrist. Pt has wound vac with prevena dressing to R hip CDI. Skin intact otherwise . On aspirin for VTE. Personal belongings and call bell within reach. Bed in locked and low position. Nurse will cont. To monitor. ? ?

## 2021-06-21 DIAGNOSIS — K589 Irritable bowel syndrome without diarrhea: Secondary | ICD-10-CM | POA: Diagnosis present

## 2021-06-21 DIAGNOSIS — Z90710 Acquired absence of both cervix and uterus: Secondary | ICD-10-CM | POA: Diagnosis not present

## 2021-06-21 DIAGNOSIS — Z833 Family history of diabetes mellitus: Secondary | ICD-10-CM | POA: Diagnosis not present

## 2021-06-21 DIAGNOSIS — Z85828 Personal history of other malignant neoplasm of skin: Secondary | ICD-10-CM | POA: Diagnosis not present

## 2021-06-21 DIAGNOSIS — E669 Obesity, unspecified: Secondary | ICD-10-CM | POA: Diagnosis present

## 2021-06-21 DIAGNOSIS — J309 Allergic rhinitis, unspecified: Secondary | ICD-10-CM | POA: Diagnosis present

## 2021-06-21 DIAGNOSIS — E785 Hyperlipidemia, unspecified: Secondary | ICD-10-CM | POA: Diagnosis present

## 2021-06-21 DIAGNOSIS — I110 Hypertensive heart disease with heart failure: Secondary | ICD-10-CM | POA: Diagnosis present

## 2021-06-21 DIAGNOSIS — Z981 Arthrodesis status: Secondary | ICD-10-CM | POA: Diagnosis not present

## 2021-06-21 DIAGNOSIS — I5022 Chronic systolic (congestive) heart failure: Secondary | ICD-10-CM | POA: Diagnosis present

## 2021-06-21 DIAGNOSIS — M1611 Unilateral primary osteoarthritis, right hip: Secondary | ICD-10-CM | POA: Diagnosis present

## 2021-06-21 DIAGNOSIS — Z7982 Long term (current) use of aspirin: Secondary | ICD-10-CM | POA: Diagnosis not present

## 2021-06-21 DIAGNOSIS — Z8249 Family history of ischemic heart disease and other diseases of the circulatory system: Secondary | ICD-10-CM | POA: Diagnosis not present

## 2021-06-21 DIAGNOSIS — Z6835 Body mass index (BMI) 35.0-35.9, adult: Secondary | ICD-10-CM | POA: Diagnosis not present

## 2021-06-21 DIAGNOSIS — I495 Sick sinus syndrome: Secondary | ICD-10-CM | POA: Diagnosis present

## 2021-06-21 DIAGNOSIS — Z20822 Contact with and (suspected) exposure to covid-19: Secondary | ICD-10-CM | POA: Diagnosis present

## 2021-06-21 DIAGNOSIS — Z79899 Other long term (current) drug therapy: Secondary | ICD-10-CM | POA: Diagnosis not present

## 2021-06-21 DIAGNOSIS — I252 Old myocardial infarction: Secondary | ICD-10-CM | POA: Diagnosis not present

## 2021-06-21 DIAGNOSIS — Z95810 Presence of automatic (implantable) cardiac defibrillator: Secondary | ICD-10-CM | POA: Diagnosis not present

## 2021-06-21 DIAGNOSIS — I2510 Atherosclerotic heart disease of native coronary artery without angina pectoris: Secondary | ICD-10-CM | POA: Diagnosis present

## 2021-06-21 DIAGNOSIS — K219 Gastro-esophageal reflux disease without esophagitis: Secondary | ICD-10-CM | POA: Diagnosis present

## 2021-06-21 DIAGNOSIS — Z87442 Personal history of urinary calculi: Secondary | ICD-10-CM | POA: Diagnosis not present

## 2021-06-21 DIAGNOSIS — I420 Dilated cardiomyopathy: Secondary | ICD-10-CM | POA: Diagnosis present

## 2021-06-21 DIAGNOSIS — M810 Age-related osteoporosis without current pathological fracture: Secondary | ICD-10-CM | POA: Diagnosis present

## 2021-06-21 LAB — BASIC METABOLIC PANEL
Anion gap: 8 (ref 5–15)
BUN: 13 mg/dL (ref 8–23)
CO2: 25 mmol/L (ref 22–32)
Calcium: 9.5 mg/dL (ref 8.9–10.3)
Chloride: 99 mmol/L (ref 98–111)
Creatinine, Ser: 0.76 mg/dL (ref 0.44–1.00)
GFR, Estimated: >60 mL/min (ref >60)
Glucose, Bld: 178 mg/dL — ABNORMAL HIGH (ref 70–99)
Potassium: 4 mmol/L (ref 3.5–5.1)
Sodium: 132 mmol/L — ABNORMAL LOW (ref 135–145)

## 2021-06-21 LAB — CBC
HCT: 37.4 % (ref 36.0–46.0)
Hemoglobin: 12.2 g/dL (ref 12.0–15.0)
MCH: 30 pg (ref 26.0–34.0)
MCHC: 32.6 g/dL (ref 30.0–36.0)
MCV: 92.1 fL (ref 80.0–100.0)
Platelets: 236 10*3/uL (ref 150–400)
RBC: 4.06 MIL/uL (ref 3.87–5.11)
RDW: 12.1 % (ref 11.5–15.5)
WBC: 16.3 10*3/uL — ABNORMAL HIGH (ref 4.0–10.5)
nRBC: 0 % (ref 0.0–0.2)

## 2021-06-21 MED ORDER — TRAMADOL HCL 50 MG PO TABS: 50.0000 mg | ORAL_TABLET | Freq: Four times a day (QID) | ORAL | 0 refills | Status: AC

## 2021-06-21 MED ORDER — NEOMYCIN-POLYMYXIN-DEXAMETH 3.5-10000-0.1 OP SUSP
1.0000 [drp] | Freq: Four times a day (QID) | OPHTHALMIC | Status: DC
  Administered 2021-06-21 – 2021-06-25 (×16): 1 [drp] via OPHTHALMIC
  Filled 2021-06-21: qty 5

## 2021-06-21 MED ORDER — HYDROCODONE-ACETAMINOPHEN 5-325 MG PO TABS
1.0000 | ORAL_TABLET | ORAL | 0 refills | Status: AC | PRN
Start: 2021-06-21 — End: ?

## 2021-06-21 MED ORDER — ENOXAPARIN SODIUM 40 MG/0.4ML IJ SOSY
40.0000 mg | PREFILLED_SYRINGE | INTRAMUSCULAR | 0 refills | Status: AC
Start: 2021-06-21 — End: 2021-07-05

## 2021-06-21 NOTE — Plan of Care (Signed)
?  Problem: Education: Goal: Knowledge of General Education information will improve Description: Including pain rating scale, medication(s)/side effects and non-pharmacologic comfort measures Outcome: Progressing   Problem: Clinical Measurements: Goal: Respiratory complications will improve Outcome: Progressing   Problem: Activity: Goal: Risk for activity intolerance will decrease Outcome: Progressing   Problem: Pain Managment: Goal: General experience of comfort will improve Outcome: Progressing   

## 2021-06-21 NOTE — Progress Notes (Signed)
Physical Therapy Treatment ?Patient Details ?Name: Allison Mclean ?MRN: [id number] ?DOB: Mar 17, 1936 ?Today's Date: 06/21/2021 ? ? ?History of Present Illness Pt is an 86 y.o. female s/p R THA anterior approach secondary to primary OA 06/20/21.  PMH includes: CAD, HFrEF, NSVT, dilated cardiomyopathy (s/p AICD placement), sick sinus syndrome, HTN, HLD, DOE, GERD/hiatal hernia (status post Niesen fundoplication), anterior fusion c-spine, OA, thoracic spinal stenosis and facet syndrome (s/p fusion). ? ?  ?PT Comments  ? ? Pt was willing and able participate in PT today progressing with transfers, LE strengthening exercise, and gait with RW. Pt's activity tolerance is still limited due to pain in R LE, requiring Min guard to Min A overall with mobility. Skilled nursing-short term rehab is still recommended due to pt's required level of assistance.  ?Recommendations for follow up therapy are one component of a multi-disciplinary discharge planning process, led by the attending physician.  Recommendations may be updated based on patient status, additional functional criteria and insurance authorization. ? ?Follow Up Recommendations ? Skilled nursing-short term rehab (<3 hours/day) ?  ?  ?Assistance Recommended at Discharge Frequent or constant Supervision/Assistance  ?Patient can return home with the following A lot of help with walking and/or transfers;A lot of help with bathing/dressing/bathroom;Assistance with cooking/housework;Assist for transportation;Help with stairs or ramp for entrance ?  ?Equipment Recommendations ? Rolling walker (2 wheels);BSC/3in1  ?  ?Recommendations for Other Services   ? ? ?  ?Precautions / Restrictions Precautions ?Precautions: Anterior Hip;Fall ?Precaution Booklet Issued: Yes (comment) ?Precaution Comments: wound vac ?Restrictions ?Weight Bearing Restrictions: Yes ?RLE Weight Bearing: Weight bearing as tolerated  ?  ? ?Mobility ? Bed Mobility ?Overal bed mobility: Needs Assistance ?  ?  ?  ?  ?   ?  ?  ?  ? ?Transfers ?Overall transfer level: Needs assistance ?Equipment used: Rolling walker (2 wheels) ?Transfers: Sit to/from Stand ?Sit to Stand: Supervision ?  ?  ?  ?  ?  ?General transfer comment: Gave cues for hand placement and safely turning to sit down with RW. ?  ? ?Ambulation/Gait ?Ambulation/Gait assistance: Min assist ?Gait Distance (Feet): 24 Feet ?Assistive device: Rolling walker (2 wheels) ?Gait Pattern/deviations: Step-to pattern, Decreased step length - right ?  ?  ?Pre-gait activities: standing with the RW with lateral weight shifts preparing for  RLE weight bearing. ?General Gait Details: cues for posture and upright gaze. ? ? ?Stairs ?  ?  ?  ?  ?  ? ? ?Wheelchair Mobility ?  ? ?Modified Rankin (Stroke Patients Only) ?  ? ? ?  ?Balance Overall balance assessment: Needs assistance ?Sitting-balance support: Feet unsupported ?Sitting balance-Leahy Scale: Good ?  ?  ?Standing balance support: Bilateral upper extremity supported, During functional activity ?Standing balance-Leahy Scale: Fair ?  ?  ?  ?  ?  ?  ?  ?  ?  ?  ?  ?  ?  ? ?  ?Cognition Arousal/Alertness: Awake/alert ?Behavior During Therapy: [facility] for tasks assessed/performed ?Overall Cognitive Status: Within Functional Limits for tasks assessed ?  ?  ?  ?  ?  ?  ?  ?  ?  ?  ?  ?  ?  ?  ?  ?  ?General Comments: alert and oriented x4 ?  ?  ? ?  ?Exercises Total Joint Exercises ?Ankle Circles/Pumps: AROM, Strengthening, Both, 10 reps, Seated ?Quad Sets: AROM, Strengthening, Both, 10 reps, Seated ?Gluteal Sets: AROM, Both, 10 reps, Seated ?Heel Slides: AAROM, Strengthening, Right, Seated, 10 reps ?Hip  ABduction/ADduction: AAROM, Strengthening, Right, Seated, 10 reps ?Other Exercises ?Other Exercises: Reviewed HEP packet with cues on corect technique and benefits. ? ?  ?General Comments General comments (skin integrity, edema, etc.): vss throughout ?  ?  ? ?Pertinent Vitals/Pain Pain Assessment ?Pain Assessment: Faces ?Faces Pain Scale:  Hurts even more ?Pain Location: R hip ?Pain Descriptors / Indicators: Sore ?Pain Intervention(s): Limited activity within patient's tolerance, Monitored during session  ? ? ?Home Living Family/patient expects to be discharged to:: Private residence ?Living Arrangements: Alone ?Available Help at Discharge: Available PRN/intermittently ?Type of Home: House ?Home Access: Ramped entrance ?  ?  ?  ?Home Layout: One level ?Home Equipment: Rolling Walker (2 wheels);Grab bars - tub/shower;Shower seat - built in ?   ?  ?Prior Function    ?  ?  ?   ? ?PT Goals (current goals can now be found in the care plan section) Acute Rehab PT Goals ?Patient Stated Goal: to improve mobility ?PT Goal Formulation: With patient ?Time For Goal Achievement: 07/04/21 ?Potential to Achieve Goals: Good ?Progress towards PT goals: Progressing toward goals ? ?  ?Frequency ? ? ? BID ? ? ? ?  ?PT Plan Current plan remains appropriate  ? ? ?Co-evaluation   ?  ?  ?  ?  ? ?  ?AM-PAC PT "6 Clicks" Mobility   ?Outcome Measure ? Help needed turning from your back to your side while in a flat bed without using bedrails?: A Little ?Help needed moving from lying on your back to sitting on the side of a flat bed without using bedrails?: A Little ?Help needed moving to and from a bed to a chair (including a wheelchair)?: A Little ?Help needed standing up from a chair using your arms (e.g., wheelchair or bedside chair)?: A Little ?Help needed to walk in hospital room?: A Little ?Help needed climbing 3-5 steps with a railing? : Total ?6 Click Score: 16 ? ?  ?End of Session Equipment Utilized During Treatment: Gait belt ?Activity Tolerance: Patient tolerated treatment well;Patient limited by pain ?Patient left: with call bell/phone within reach;with family/visitor present;Other (comment);with chair alarm set ?Nurse Communication: Mobility status ?PT Visit Diagnosis: Other abnormalities of gait and mobility (R26.89);Muscle weakness (generalized)  (M62.81);Pain ?Pain - Right/Left: Right ?Pain - part of body: Hip ?  ? ? ?Time: 2620-3559 ?PT Time Calculation (min) (ACUTE ONLY): 28 min ? ?Charges:  $Therapeutic Exercise: 8-22 mins ?$Therapeutic Activity: 8-22 mins          ?          ?Bjorn Loser, PTA  ?06/21/21, 12:23 PM  ?

## 2021-06-21 NOTE — Progress Notes (Signed)
?Subjective: ?1 Day Post-Op Procedure(s) (LRB): ?TOTAL HIP ARTHROPLASTY ANTERIOR APPROACH (Right) ?Patient reports pain as mild.   ?Patient is well, and has had no acute complaints or problems ?Plan is to go Rehab after hospital stay. ?Negative for chest pain and shortness of breath ?Fever: no ?Gastrointestinal: Negative for nausea and vomiting ? ?Objective: ?Vital signs in last 24 hours: ?Temp:  [97.2 ?F (36.2 ?C)-98.1 ?F (36.7 ?C)] 97.9 ?F (36.6 ?C) (03/18 0340) ?Pulse Rate:  [61-84] 78 (03/18 0340) ?Resp:  [8-22] 16 (03/18 0340) ?BP: (113-160)/(42-77) 143/57 (03/18 3762) ?SpO2:  [93 %-100 %] 93 % (03/18 0340) ?Weight:  [81.6 kg] 81.6 kg (03/17 1036) ? ?Intake/Output from previous day: ? ?Intake/Output Summary (Last 24 hours) at 06/21/2021 0705 ?Last data filed at 06/21/2021 0226 ?Gross per 24 hour  ?Intake 1309.62 ml  ?Output 1275 ml  ?Net 34.62 ml  ?  ?Intake/Output this shift: ?No intake/output data recorded. ? ?Labs: ?Recent Labs  ?  06/20/21 ?1801 06/21/21 ?0420  ?HGB 14.7 12.2  ? ?Recent Labs  ?  06/20/21 ?1801 06/21/21 ?0420  ?WBC 18.4* 16.3*  ?RBC 4.82 4.06  ?HCT 45.7 37.4  ?PLT 236 236  ? ?Recent Labs  ?  06/20/21 ?1708 06/21/21 ?0420  ?NA  --  132*  ?K  --  4.0  ?CL  --  99  ?CO2  --  25  ?BUN  --  13  ?CREATININE 0.80 0.76  ?GLUCOSE  --  178*  ?CALCIUM  --  9.5  ? ?No results for input(s): LABPT, INR in the last 72 hours. ? ? ?EXAM ?General - Patient is Alert and Oriented ?Extremity - Neurovascular intact ?Sensation intact distally ?Dorsiflexion/Plantar flexion intact ?Compartment soft ?Dressing/Incision - clean, dry, with the wound VAC in place ?Motor Function - intact, moving foot and toes well on exam.  ? ?Past Medical History:  ?Diagnosis Date  ? 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 AICD (automatic cardioverter/defibrillator) present 03/08/2018  ? a.) [place] DR model [id number] (serial  Number [id number]) ICD  ? Coronary artery disease   ? a.) LHC 02/07/2015: EF 45-50; global HK;  normal coronary anatomy; no CAD. b.) LHC 12/21/2017: DCM with EF 29%; 20% p-m RCA, 20% mLAD; no interventions.  ? Cortical senile cataract   ? Dilated cardiomyopathy ([place])   ? a.) TTE 01/26/2014: EF 50%. b.) TTE 09/16/2015: EF 40%. c.) TTE 05/14/2017: EF 30%. d.) TTE 06/28/2018: EF 30%.  ? DOE (dyspnea on exertion)   ? GERD (gastroesophageal reflux disease)   ? a.) resolved s/p Nissen fundoplication  ? HFrEF (heart failure with reduced ejection fraction) ([place])   ? a.) TTE 01/23/2014: EF 50%; global HK, mild LA dilation, mild LVH; triv MR, mild AR/TR/PR. b.) TTE 09/16/2015: EF 40%; mild LVH; mild LA dilation; global HK; mild panvalvular regurgitation. c.) TTE 05/14/2017: EF 30%; mod global HK, mild LA dilation, G1DD, triv AR/MR/PR, mild MR. d.) TTE 06/28/2018: EF 30%; mod HK; triv AR/PR, mild MR/TR; G1DD.  ? History of hiatal hernia   ? a.) s/p Nissen fundoplication  ? History of kidney stones   ? History of lumbar fusion (12/19/2013) (posterior lumbar fusion from L2-L5) 07/02/2015  ? Hyperlipidemia   ? Hypertension   ? IBS (irritable bowel syndrome)   ? [place] esophagus   ? NSVT (nonsustained ventricular tachycardia)   ? Osteoarthritis   ? Osteopenia, senile 07/02/2015  ? Skin cancer   ? SSS (sick sinus syndrome) ([facility])   ? a.) s/p AICD placement  ?  Thoracic central spinal stenosis 07/02/2015  ? Thoracic facet syndrome (Left) 07/02/2015  ? Thoracic foraminal stenosis (Moderate to severe at: Right T5; Bilateral T9; Left T11) 07/02/2015  ? Thoracic spondylosis with radiculopathy (Left) 07/09/2015  ? Wears dentures   ? partial lower  ? ? ?Assessment/Plan: ?1 Day Post-Op Procedure(s) (LRB): ?TOTAL HIP ARTHROPLASTY ANTERIOR APPROACH (Right) ?Principal Problem: ?  Status post total hip replacement, right ? ?Estimated body mass index is 35.13 kg/m? as calculated from the following: ?  Height as of this encounter: 5' (1.524 m). ?  Weight as of this encounter: 81.6 kg. ?Advance diet ?Up with therapy ?D/C IV  fluids ? ?Discharge planning.  Patient would like to go to rehab.  Probable Monday. ? ?DVT Prophylaxis - Lovenox, Foot Pumps, and TED hose ?Weight-Bearing as tolerated to right leg ? ?Reche Dixon, PA-C ?Orthopaedic Surgery ?06/21/2021, 7:05 AM ? ?

## 2021-06-21 NOTE — Progress Notes (Signed)
Physical Therapy Treatment ?Patient Details ?Name: Allison Mclean ?MRN: [id number] ?DOB: 11/13/1935 ?Today's Date: 06/21/2021 ? ? ?History of Present Illness Pt is an 86 y.o. female s/p R THA anterior approach secondary to primary OA 06/20/21.  PMH includes: CAD, HFrEF, NSVT, dilated cardiomyopathy (s/p AICD placement), sick sinus syndrome, HTN, HLD, DOE, GERD/hiatal hernia (status post Niesen fundoplication), anterior fusion c-spine, OA, thoracic spinal stenosis and facet syndrome (s/p fusion). ? ?  ?PT Comments  ? ? Pt up in recliner willing and able to work with PT for 2nd session.  Pt progressed with activity tolerance, ambulating to and from the bathroom with RW at min guard level. Pt does fatigue quickly and STR continues to be recommended due to pt's current assist level.   ?Recommendations for follow up therapy are one component of a multi-disciplinary discharge planning process, led by the attending physician.  Recommendations may be updated based on patient status, additional functional criteria and insurance authorization. ? ?Follow Up Recommendations ? Skilled nursing-short term rehab (<3 hours/day) ?  ?  ?Assistance Recommended at Discharge Frequent or constant Supervision/Assistance  ?Patient can return home with the following A lot of help with bathing/dressing/bathroom;Assistance with cooking/housework;Assist for transportation;Help with stairs or ramp for entrance;A little help with walking and/or transfers ?  ?Equipment Recommendations ? Rolling walker (2 wheels);BSC/3in1  ?  ?Recommendations for Other Services   ? ? ?  ?Precautions / Restrictions Precautions ?Precautions: Anterior Hip;Fall ?Precaution Booklet Issued: Yes (comment) ?Precaution Comments: wound vac ?Restrictions ?Weight Bearing Restrictions: Yes ?RLE Weight Bearing: Weight bearing as tolerated  ?  ? ?Mobility ? Bed Mobility ?Overal bed mobility: Needs Assistance ?  ?  ?  ?  ?  ?  ?General bed mobility comments: Pt up in recliner at  beginning of session. ?  ? ?Transfers ?Overall transfer level: Needs assistance ?Equipment used: Rolling walker (2 wheels) ?Transfers: Sit to/from Stand ?Sit to Stand: Supervision ?  ?  ?  ?  ?  ?General transfer comment: Gave cues for hand placement and safely turning to sit down with RW. ?  ? ?Ambulation/Gait ?Ambulation/Gait assistance: Min guard ?Gait Distance (Feet): 40 Feet ?Assistive device: Rolling walker (2 wheels) ?Gait Pattern/deviations: Step-to pattern, Decreased step length - right, Step-through pattern ?  ?  ?Pre-gait activities: standing with the RW with lateral weight shifts preparing for  RLE weight bearing. ?General Gait Details: cues for posture and upright gaze.  Pt walked to bathroom and back increasing activity tolerance. ? ? ?Stairs ?  ?  ?  ?  ?  ? ? ?Wheelchair Mobility ?  ? ?Modified Rankin (Stroke Patients Only) ?  ? ? ?  ?Balance Overall balance assessment: Needs assistance ?Sitting-balance support: Feet unsupported ?Sitting balance-Leahy Scale: Good ?  ?  ?Standing balance support: Bilateral upper extremity supported, During functional activity ?Standing balance-Leahy Scale: Good ?  ?  ?  ?  ?  ?  ?  ?  ?  ?  ?  ?  ?  ? ?  ?Cognition Arousal/Alertness: Awake/alert ?Behavior During Therapy: [facility] for tasks assessed/performed ?Overall Cognitive Status: Within Functional Limits for tasks assessed ?  ?  ?  ?  ?  ?  ?  ?  ?  ?  ?  ?  ?  ?  ?  ?  ?General Comments: alert and oriented x4 ?  ?  ? ?  ?Exercises Total Joint Exercises ?Ankle Circles/Pumps: AROM, Strengthening, Both, 10 reps, Seated ?Quad Sets: AROM, Strengthening, Both, 10 reps, Seated ?  Gluteal Sets: AROM, Both, 10 reps, Seated ?Heel Slides: AAROM, Strengthening, Right, Seated, 10 reps ?Hip ABduction/ADduction: AAROM, Strengthening, Right, Seated, 10 reps ?Other Exercises ?Other Exercises: Reviewed HEP packet with cues on corect technique and benefits. ? ?  ?General Comments   ?  ?  ? ?Pertinent Vitals/Pain Pain Assessment ?Pain  Assessment: Faces ?Faces Pain Scale: Hurts little more ?Pain Location: R hip ?Pain Descriptors / Indicators: Sore ?Pain Intervention(s): Limited activity within patient's tolerance, Monitored during session  ? ? ?Home Living Family/patient expects to be discharged to:: Private residence ?Living Arrangements: Alone ?Available Help at Discharge: Available PRN/intermittently ?Type of Home: House ?  ?  ?  ?  ?  ?  ?   ?  ?Prior Function    ?  ?  ?   ? ?PT Goals (current goals can now be found in the care plan section) Acute Rehab PT Goals ?Patient Stated Goal: to improve mobility ?PT Goal Formulation: With patient ?Time For Goal Achievement: 07/04/21 ?Potential to Achieve Goals: Good ?Progress towards PT goals: Progressing toward goals ? ?  ?Frequency ? ? ? BID ? ? ? ?  ?PT Plan Current plan remains appropriate  ? ? ?Co-evaluation   ?  ?  ?  ?  ? ?  ?AM-PAC PT "6 Clicks" Mobility   ?Outcome Measure ? Help needed turning from your back to your side while in a flat bed without using bedrails?: A Little ?Help needed moving from lying on your back to sitting on the side of a flat bed without using bedrails?: A Little ?Help needed moving to and from a bed to a chair (including a wheelchair)?: A Little ?Help needed standing up from a chair using your arms (e.g., wheelchair or bedside chair)?: A Little ?Help needed to walk in hospital room?: A Little ?Help needed climbing 3-5 steps with a railing? : Total ?6 Click Score: 16 ? ?  ?End of Session Equipment Utilized During Treatment: Gait belt ?Activity Tolerance: Patient tolerated treatment well;Patient limited by pain ?Patient left: with call bell/phone within reach;with family/visitor present;Other (comment);with chair alarm set ?Nurse Communication: Mobility status ?PT Visit Diagnosis: Other abnormalities of gait and mobility (R26.89);Muscle weakness (generalized) (M62.81);Pain ?Pain - Right/Left: Right ?Pain - part of body: Hip ?  ? ? ?Time: 7169-6789 ?PT Time Calculation  (min) (ACUTE ONLY): 19 min ? ?Charges:  $Gait Training: 8-22 mins ?$Therapeutic Exercise: 8-22 mins ?$Therapeutic Activity: 8-22 mins          ?          ? ?Bjorn Loser, PTA  ?06/21/21, 3:43 PM  ?

## 2021-06-21 NOTE — Discharge Instructions (Addendum)
ANTERIOR APPROACH TOTAL HIP REPLACEMENT POSTOPERATIVE DIRECTIONS ? ? ?Hip Rehabilitation, Guidelines Following Surgery  ?The results of a hip operation are greatly improved after range of motion and muscle strengthening exercises. Follow all safety measures which are given to protect your hip. If any of these exercises cause increased pain or swelling in your joint, decrease the amount until you are comfortable again. Then slowly increase the exercises. Call your caregiver if you have problems or questions.  ? ?HOME CARE INSTRUCTIONS  ?Remove items at home which could result in a fall. This includes throw rugs or furniture in walking pathways.  ?ICE to the affected hip every three hours for 30 minutes at a time and then as needed for pain and swelling.  Continue to use ice on the hip for pain and swelling from surgery. You may notice swelling that will progress down to the foot and ankle.  This is normal after surgery.  Elevate the leg when you are not up walking on it.   ?Continue to use the breathing machine which will help keep your temperature down.  It is common for your temperature to cycle up and down following surgery, especially at night when you are not up moving around and exerting yourself.  The breathing machine keeps your lungs expanded and your temperature down. ?Do not place pillow under knee, focus on keeping the knee straight while resting ? ?DIET ?You may resume your previous home diet once your are discharged from the hospital. ? ?DRESSING / WOUND CARE / SHOWERING ?Please remove provena negative pressure dressing on 07/01/2021 and apply honey comb dressing. Keep dressing clean and dry at all times. ? ? ?ACTIVITY ?Walk with your walker as instructed. ?Use walker as long as suggested by your caregivers. ?Avoid periods of inactivity such as sitting longer than an hour when not asleep. This helps prevent blood clots.  ?You may resume a sexual relationship in one month or when given the OK by your  doctor.  ?You may return to work once you are cleared by your doctor.  ?Do not drive a car for 6 weeks or until released by you surgeon.  ?Do not drive while taking narcotics. ? ?WEIGHT BEARING ?Weight bearing as tolerated with assist device (walker, cane, etc) as directed, use it as long as suggested by your surgeon or therapist, typically at least 4-6 weeks. ? ?POSTOPERATIVE CONSTIPATION PROTOCOL ?Constipation - defined medically as fewer than three stools per week and severe constipation as less than one stool per week. ? ?One of the most common issues patients have following surgery is constipation.  Even if you have a regular bowel pattern at home, your normal regimen is likely to be disrupted due to multiple reasons following surgery.  Combination of anesthesia, postoperative narcotics, change in appetite and fluid intake all can affect your bowels.  In order to avoid complications following surgery, here are some recommendations in order to help you during your recovery period. ? ?Colace (docusate) - Pick up an over-the-counter form of Colace or another stool softener and take twice a day as long as you are requiring postoperative pain medications.  Take with a full glass of water daily.  If you experience loose stools or diarrhea, hold the colace until you stool forms back up.  If your symptoms do not get better within 1 week or if they get worse, check with your doctor. ? ?Dulcolax (bisacodyl) - Pick up over-the-counter and take as directed by the product packaging as needed to assist  with the movement of your bowels.  Take with a full glass of water.  Use this product as needed if not relieved by Colace only.  ? ?MiraLax (polyethylene glycol) - Pick up over-the-counter to have on hand.  MiraLax is a solution that will increase the amount of water in your bowels to assist with bowel movements.  Take as directed and can mix with a glass of water, juice, soda, coffee, or tea.  Take if you go more than two  days without a movement. ?Do not use MiraLax more than once per day. Call your doctor if you are still constipated or irregular after using this medication for 7 days in a row. ? ?If you continue to have problems with postoperative constipation, please contact the office for further assistance and recommendations.  If you experience "the worst abdominal pain ever" or develop nausea or vomiting, please contact the office immediatly for further recommendations for treatment. ? ?ITCHING ? If you experience itching with your medications, try taking only a single pain pill, or even half a pain pill at a time.  You can also use Benadryl over the counter for itching or also to help with sleep.  ? ?TED HOSE STOCKINGS ?Wear the elastic stockings on both legs for three weeks following surgery during the day but you may remove then at night for sleeping. ? ?MEDICATIONS ?See your medication summary on the ?After Visit Summary? that the nursing staff will review with you prior to discharge.  You may have some home medications which will be placed on hold until you complete the course of blood thinner medication.  It is important for you to complete the blood thinner medication as prescribed by your surgeon.  Continue your approved medications as instructed at time of discharge. ? ?PRECAUTIONS ?If you experience chest pain or shortness of breath - call 911 immediately for transfer to the hospital emergency department.  ?If you develop a fever greater that 101 F, purulent drainage from wound, increased redness or drainage from wound, foul odor from the wound/dressing, or calf pain - CONTACT YOUR SURGEON.   ?                                                ?FOLLOW-UP APPOINTMENTS ?Make sure you keep all of your appointments after your operation with your surgeon and caregivers. You should call the office at the above phone number and make an appointment for approximately two weeks after the date of your surgery or on the date  instructed by your surgeon outlined in the "After Visit Summary". ? ?RANGE OF MOTION AND STRENGTHENING EXERCISES  ?These exercises are designed to help you keep full movement of your hip joint. Follow your caregiver's or physical therapist's instructions. Perform all exercises about fifteen times, three times per day or as directed. Exercise both hips, even if you have had only one joint replacement. These exercises can be done on a training (exercise) mat, on the floor, on a table or on a bed. Use whatever works the best and is most comfortable for you. Use music or television while you are exercising so that the exercises are a pleasant break in your day. This will make your life better with the exercises acting as a break in routine you can look forward to.  ?Lying on your back, slowly slide your foot toward  your buttocks, raising your knee up off the floor. Then slowly slide your foot back down until your leg is straight again.  ?Lying on your back spread your legs as far apart as you can without causing discomfort.  ?Lying on your side, raise your upper leg and foot straight up from the floor as far as is comfortable. Slowly lower the leg and repeat.  ?Lying on your back, tighten up the muscle in the front of your thigh (quadriceps muscles). You can do this by keeping your leg straight and trying to raise your heel off the floor. This helps strengthen the largest muscle supporting your knee.  ?Lying on your back, tighten up the muscles of your buttocks both with the legs straight and with the knee bent at a comfortable angle while keeping your heel on the floor.  ? ?IF YOU ARE TRANSFERRED TO A SKILLED REHAB FACILITY ?If the patient is transferred to a skilled rehab facility following release from the hospital, a list of the current medications will be sent to the facility for the patient to continue.  When discharged from the skilled rehab facility, please have the facility set up the patient's [place] prior to being released. Also, the skilled facility will be responsible for providing the patient with their medications at time of release from the facility to include their pain medication, the musc

## 2021-06-21 NOTE — Evaluation (Signed)
Occupational Therapy Evaluation ?Patient Details ?Name: Allison Mclean ?MRN: [id number] ?DOB: 1936-03-07 ?Today's Date: 06/21/2021 ? ? ?History of Present Illness Pt is an 86 y.o. female s/p R THA anterior approach secondary to primary OA 06/20/21.  PMH includes: CAD, HFrEF, NSVT, dilated cardiomyopathy (s/p AICD placement), sick sinus syndrome, HTN, HLD, DOE, GERD/hiatal hernia (status post Niesen fundoplication), anterior fusion c-spine, OA, thoracic spinal stenosis and facet syndrome (s/p fusion).  ? ?Clinical Impression ?  ?Chart reviewed to date, nurse cleared pt for participation in OT tx session. Pt agreeable to OT evaluation s/p THA anterior approach, alert and oriented x4. Pt lives alone, completes ADL with MOD I-I, assist with IADLs as needed. At this time pt present with deficits in endurance, balance, strength and is performing all ADL/functional mobility below PLOF. Pt would benefit from STR to address functional deficits and to facilitate safe discharge home. Pt is left in bedside chair, NAD, all needs met. OT will continue to follow acutely.  ?   ? ?Recommendations for follow up therapy are one component of a multi-disciplinary discharge planning process, led by the attending physician.  Recommendations may be updated based on patient status, additional functional criteria and insurance authorization.  ? ?Follow Up Recommendations ? Skilled nursing-short term rehab (<3 hours/day)  ?  ?Assistance Recommended at Discharge Frequent or constant Supervision/Assistance  ?Patient can return home with the following A lot of help with bathing/dressing/bathroom;A little help with walking and/or transfers;Assistance with cooking/housework;Assist for transportation;Help with stairs or ramp for entrance ? ?  ?Functional Status Assessment ? Patient has had a recent decline in their functional status and demonstrates the ability to make significant improvements in function in a reasonable and predictable amount of time.   ?Equipment Recommendations ? None recommended by OT;Other (comment) (pt has recommended equipment)  ?  ?Recommendations for Other Services   ? ? ?  ?Precautions / Restrictions Precautions ?Precautions: Anterior Hip;Fall ?Precaution Comments: wound vac ?Restrictions ?Weight Bearing Restrictions: Yes ?RLE Weight Bearing: Weight bearing as tolerated  ? ?  ? ?Mobility Bed Mobility ?Overal bed mobility: Needs Assistance ?Bed Mobility: Supine to Sit ?  ?  ?Supine to sit: Min assist ?  ?  ?  ?  ? ?Transfers ?Overall transfer level: Needs assistance ?  ?Transfers: Sit to/from Stand ?Sit to Stand: Min assist ?  ?  ?  ?  ?  ?  ?  ? ?  ?Balance Overall balance assessment: Needs assistance ?Sitting-balance support: Feet supported ?Sitting balance-Leahy Scale: Good ?  ?  ?Standing balance support: Bilateral upper extremity supported, During functional activity ?Standing balance-Leahy Scale: Fair ?  ?  ?  ?  ?  ?  ?  ?  ?  ?  ?  ?  ?   ? ?ADL either performed or assessed with clinical judgement  ? ?ADL Overall ADL's : Needs assistance/impaired ?Eating/Feeding: Set up ?  ?Grooming: Dance movement psychotherapist;Set up;Sitting ?  ?Upper Body Bathing: Minimal assistance;Sitting ?  ?Lower Body Bathing: Maximal assistance;Sit to/from stand ?  ?Upper Body Dressing : Minimal assistance ?  ?Lower Body Dressing: Maximal assistance ?Lower Body Dressing Details (indicate cue type and reason): baseline pt uses AE with MOD I ?Toilet Transfer: Min guard;Ambulation;BSC/3in1;Rolling walker (2 wheels) ?  ?Toileting- Clothing Manipulation and Hygiene: Maximal assistance ?Toileting - Clothing Manipulation Details (indicate cue type and reason): for peri care pt uses bottom buddy at baseline ?  ?  ?Functional mobility during ADLs: Min guard;Rolling walker (2 wheels) ?   ? ? ? ?  Vision Patient Visual Report: No change from baseline ?   ?   ?Perception   ?  ?Praxis   ?  ? ?Pertinent Vitals/Pain Pain Assessment ?Pain Assessment: 0-10 ?Pain Score: 5  ?Pain Location: R  hip ?Pain Descriptors / Indicators: Sore ?Pain Intervention(s): Limited activity within patient's tolerance, Monitored during session, Repositioned  ? ? ? ?Hand Dominance   ?  ?Extremity/Trunk Assessment Upper Extremity Assessment ?Upper Extremity Assessment: Overall WFL for tasks assessed ?  ?Lower Extremity Assessment ?Lower Extremity Assessment: RLE deficits/detail ?RLE Deficits / Details: s/p R THA ?  ?  ?  ?Communication Communication ?Communication: HOH ?  ?Cognition Arousal/Alertness: Awake/alert ?Behavior During Therapy: [facility] for tasks assessed/performed ?Overall Cognitive Status: Within Functional Limits for tasks assessed ?  ?  ?  ?  ?  ?  ?  ?  ?  ?  ?  ?  ?  ?  ?  ?  ?General Comments: alert and oriented x4 ?  ?  ?General Comments  vss throughout ? ?  ?Exercises   ?  ?Shoulder Instructions    ? ? ?Home Living Family/patient expects to be discharged to:: Private residence ?Living Arrangements: Alone ?Available Help at Discharge: Available PRN/intermittently ?Type of Home: House ?Home Access: Ramped entrance ?  ?  ?Home Layout: One level ?  ?  ?Bathroom Shower/Tub: Walk-in shower ?  ?Bathroom Toilet: Standard ?  ?  ?Home Equipment: Rolling Walker (2 wheels);Grab bars - tub/shower;Shower seat - built in ?  ?  ?  ? ?  ?Prior Functioning/Environment Prior Level of Function : Independent/Modified Independent ?  ?  ?  ?  ?  ?  ?Mobility Comments: amb with SPC vs RW as needed ?ADLs Comments: MOD I-I in all ADL; has a cleaner and son assists with outdoor landscaping but completes all other IADL with MOD I ?  ? ?  ?  ?OT Problem List: Decreased strength;Impaired balance (sitting and/or standing);Decreased activity tolerance ?  ?   ?OT Treatment/Interventions: Self-care/ADL training;Therapeutic exercise;Patient/family education;Therapeutic activities;DME and/or AE instruction  ?  ?OT Goals(Current goals can be found in the care plan section) Acute Rehab OT Goals ?Patient Stated Goal: go to rehab ?OT Goal  Formulation: With patient ?Time For Goal Achievement: 07/05/21 ?Potential to Achieve Goals: Good ?ADL Goals ?Pt Will Perform Grooming: with modified independence ?Pt Will Perform Upper Body Dressing: with modified independence ?Pt Will Perform Lower Body Dressing: with modified independence ?Pt Will Transfer to Toilet: with modified independence;ambulating ?Pt Will Perform Toileting - Clothing Manipulation and hygiene: with modified independence;sit to/from stand  ?OT Frequency: Min 2X/week ?  ? ?Co-evaluation   ?  ?  ?  ?  ? ?  ?AM-PAC OT "6 Clicks" Daily Activity     ?Outcome Measure Help from another person eating meals?: None ?Help from another person taking care of personal grooming?: A Little ?Help from another person toileting, which includes using toliet, bedpan, or urinal?: A Lot ?Help from another person bathing (including washing, rinsing, drying)?: A Lot ?Help from another person to put on and taking off regular upper body clothing?: A Little ?Help from another person to put on and taking off regular lower body clothing?: A Lot ?6 Click Score: 16 ?  ?End of Session Equipment Utilized During Treatment: Gait belt;Rolling walker (2 wheels) ?Nurse Communication: Mobility status ? ?Activity Tolerance: Patient tolerated treatment well ?Patient left: in chair;with call bell/phone within reach;with chair alarm set;with family/visitor present ? ?OT Visit Diagnosis: Unsteadiness on feet (R26.81);Muscle weakness (  generalized) (M62.81)  ?              ?Time: 5871-8410 ?OT Time Calculation (min): 28 min ?Charges:  OT General Charges ?$OT Visit: 1 Visit ?OT Evaluation ?$OT Eval Low Complexity: 1 Low ?OT Treatments ?$Self Care/Home Management : 8-22 mins ? ?Shanon Payor, OTD OTR/L  ?06/21/21, 11:39 AM  ?

## 2021-06-22 LAB — CBC
HCT: 35.8 % — ABNORMAL LOW (ref 36.0–46.0)
Hemoglobin: 11.6 g/dL — ABNORMAL LOW (ref 12.0–15.0)
MCH: 29.7 pg (ref 26.0–34.0)
MCHC: 32.4 g/dL (ref 30.0–36.0)
MCV: 91.8 fL (ref 80.0–100.0)
Platelets: 225 10*3/uL (ref 150–400)
RBC: 3.9 MIL/uL (ref 3.87–5.11)
RDW: 12.5 % (ref 11.5–15.5)
WBC: 12.2 10*3/uL — ABNORMAL HIGH (ref 4.0–10.5)
nRBC: 0 % (ref 0.0–0.2)

## 2021-06-22 LAB — COMPREHENSIVE METABOLIC PANEL
ALT: 14 U/L (ref 0–44)
AST: 30 U/L (ref 15–41)
Albumin: 3.3 g/dL — ABNORMAL LOW (ref 3.5–5.0)
Alkaline Phosphatase: 52 U/L (ref 38–126)
Anion gap: 10 (ref 5–15)
BUN: 17 mg/dL (ref 8–23)
CO2: 24 mmol/L (ref 22–32)
Calcium: 9.6 mg/dL (ref 8.9–10.3)
Chloride: 99 mmol/L (ref 98–111)
Creatinine, Ser: 0.84 mg/dL (ref 0.44–1.00)
GFR, Estimated: >60 mL/min (ref >60)
Glucose, Bld: 148 mg/dL — ABNORMAL HIGH (ref 70–99)
Potassium: 4 mmol/L (ref 3.5–5.1)
Sodium: 133 mmol/L — ABNORMAL LOW (ref 135–145)
Total Bilirubin: 0.4 mg/dL (ref 0.3–1.2)
Total Protein: 6.2 g/dL — ABNORMAL LOW (ref 6.5–8.1)

## 2021-06-22 NOTE — Progress Notes (Signed)
Physical Therapy Treatment ?Patient Details ?Name: Allison Mclean ?MRN: [id number] ?DOB: 02/04/1936 ?Today's Date: 06/22/2021 ? ? ?History of Present Illness Pt is an 86 y.o. female s/p R THA anterior approach secondary to primary OA 06/20/21.  PMH includes: CAD, HFrEF, NSVT, dilated cardiomyopathy (s/p AICD placement), sick sinus syndrome, HTN, HLD, DOE, GERD/hiatal hernia (status post Niesen fundoplication), anterior fusion c-spine, OA, thoracic spinal stenosis and facet syndrome (s/p fusion). ? ?  ?PT Comments  ? ? Progressive increase in gait distance (up to 120') and overall activity tolerance, completing all mobility with no greater than cga level of assist from therapist. Improving comfort/confidence in movement patterns; progressing well towards all goals necessary to facilitate safe discharge. ? ?  ?Recommendations for follow up therapy are one component of a multi-disciplinary discharge planning process, led by the attending physician.  Recommendations may be updated based on patient status, additional functional criteria and insurance authorization. ? ?Follow Up Recommendations ? Skilled nursing-short term rehab (<3 hours/day) ?  ?  ?Assistance Recommended at Discharge Frequent or constant Supervision/Assistance  ?Patient can return home with the following Assistance with cooking/housework;Assist for transportation;Help with stairs or ramp for entrance;A little help with walking and/or transfers ?  ?Equipment Recommendations ? Rolling walker (2 wheels);BSC/3in1  ?  ?Recommendations for Other Services   ? ? ?  ?Precautions / Restrictions Precautions ?Precautions: Anterior Hip;Fall ?Precaution Comments: wound vac ?Restrictions ?Weight Bearing Restrictions: Yes ?RLE Weight Bearing: Weight bearing as tolerated  ?  ? ?Mobility ? Bed Mobility ?  ?  ?  ?  ?  ?  ?  ?General bed mobility comments: seated in recliner beginning/end of treatment session ?  ? ?Transfers ?Overall transfer level: Needs assistance ?Equipment  used: Rolling walker (2 wheels) ?Transfers: Sit to/from Stand ?Sit to Stand: Min guard, Supervision ?  ?  ?  ?  ?  ?General transfer comment: completes from recliner, BSC, cga/close sup despite surface change.  Does maintain R LE slightly anterior to BOS for pain control; moderate use of UEs for lift off and balance ?  ? ?Ambulation/Gait ?  ?Gait Distance (Feet): 120 Feet ?Assistive device: Rolling walker (2 wheels) ?  ?  ?  ?  ?General Gait Details: slow and deliberate stepping pattern; min cuing for walker position and postural extension.  Decreased WBing/stance time R LE, but improves throughout distance. No overt buckling or LOB ? ? ?Stairs ?  ?  ?  ?  ?  ? ? ?Wheelchair Mobility ?  ? ?Modified Rankin (Stroke Patients Only) ?  ? ? ?  ?Balance Overall balance assessment: Needs assistance ?Sitting-balance support: No upper extremity supported, Feet supported ?Sitting balance-Leahy Scale: Good ?  ?  ?Standing balance support: Bilateral upper extremity supported ?Standing balance-Leahy Scale: Fair ?  ?  ?  ?  ?  ?  ?  ?  ?  ?  ?  ?  ?  ? ?  ?Cognition Arousal/Alertness: Awake/alert ?Behavior During Therapy: [facility] for tasks assessed/performed ?Overall Cognitive Status: Within Functional Limits for tasks assessed ?  ?  ?  ?  ?  ?  ?  ?  ?  ?  ?  ?  ?  ?  ?  ?  ?  ?  ?  ? ?  ?Exercises Other Exercises ?Other Exercises: Standing LE therex, 1x10, active ROM with RW, cga/close sup: heel raises, mini squats, marching ?Other Exercises: Toilet transfer, ambulatory with RW, cga/close sup ? ?  ?General Comments   ?  ?  ? ?  Pertinent Vitals/Pain Pain Assessment ?Pain Assessment: Faces ?Faces Pain Scale: Hurts a little bit ?Pain Location: R hip ?Pain Descriptors / Indicators: Sore (stiffness) ?Pain Intervention(s): Limited activity within patient's tolerance, Monitored during session, Repositioned (declined pain medication at this time)  ? ? ?Home Living   ?  ?  ?  ?  ?  ?  ?  ?  ?  ?   ?  ?Prior Function    ?  ?  ?   ? ?PT Goals  (current goals can now be found in the care plan section) Acute Rehab PT Goals ?Patient Stated Goal: to improve mobility ?PT Goal Formulation: With patient ?Time For Goal Achievement: 07/04/21 ?Potential to Achieve Goals: Good ?Progress towards PT goals: Progressing toward goals ? ?  ?Frequency ? ? ? BID ? ? ? ?  ?PT Plan Current plan remains appropriate  ? ? ?Co-evaluation   ?  ?  ?  ?  ? ?  ?AM-PAC PT "6 Clicks" Mobility   ?Outcome Measure ? Help needed turning from your back to your side while in a flat bed without using bedrails?: A Little ?Help needed moving from lying on your back to sitting on the side of a flat bed without using bedrails?: A Little ?Help needed moving to and from a bed to a chair (including a wheelchair)?: A Little ?Help needed standing up from a chair using your arms (e.g., wheelchair or bedside chair)?: A Little ?Help needed to walk in hospital room?: A Little ?Help needed climbing 3-5 steps with a railing? : A Lot ?6 Click Score: 17 ? ?  ?End of Session Equipment Utilized During Treatment: Gait belt ?Activity Tolerance: Patient tolerated treatment well ?Patient left: with call bell/phone within reach;with family/visitor present;Other (comment);with chair alarm set ?Nurse Communication: Mobility status ?PT Visit Diagnosis: Other abnormalities of gait and mobility (R26.89);Muscle weakness (generalized) (M62.81);Pain ?Pain - Right/Left: Right ?Pain - part of body: Hip ?  ? ? ?Time: 5027-7412 ?PT Time Calculation (min) (ACUTE ONLY): 25 min ? ?Charges:  $Gait Training: 8-22 mins ?$Therapeutic Exercise: 8-22 mins          ?          ? ?Khanh Cordner H. Owens Shark, PT, DPT, NCS ?06/22/21, 10:23 AM ?[phone] ? ? ?

## 2021-06-22 NOTE — TOC Progression Note (Signed)
Transition of Care (TOC) - Progression Note  ? ? ?Patient Details  ?Name: Allison Mclean ?MRN: [id number] ?Date of Birth: Aug 23, 1935 ? ?Transition of Care (TOC) CM/SW Contact  ?Izola Price, RN ?Phone Number: ?06/22/2021, 2:11 PM ? ?Clinical Narrative: 2 pm. Spoke with son and patient via speaker phone in room. Discussed STR/SNF process and patient prefers [facility], [place], in that order. Explained broad bed search in case alternative needed and patient and son agreed. Verbal permission to share information needed for STR/SNF placement. Simmie Davies RN CM    ? ? ? ?  ?  ? ?Expected Discharge Plan and Services ?  ?  ?  ?  ?  ?                ?  ?  ?  ?  ?  ?  ?  ?  ?  ?  ? ? ?Social Determinants of Health (SDOH) Interventions ?  ? ?Readmission Risk Interventions ?No flowsheet data found. ? ?

## 2021-06-22 NOTE — TOC Progression Note (Signed)
Transition of Care (TOC) - Progression Note  ? ? ?Patient Details  ?Name: SHEMEIKA STARZYK ?MRN: [id number] ?Date of Birth: [birth year] ? ?Transition of Care (TOC) CM/SW Contact  ?Izola Price, RN ?Phone Number: ?06/22/2021, 2:38 PM ? ?Clinical Narrative:  Bed search initiated via [place]. [facility] and [facility] first two choices. Simmie Davies RN CM  ? ? ? ?  ?  ? ?Expected Discharge Plan and Services ?  ?  ?  ?  ?  ?                ?  ?  ?  ?  ?  ?  ?  ?  ?  ?  ? ? ?Social Determinants of Health (SDOH) Interventions ?  ? ?Readmission Risk Interventions ?No flowsheet data found. ? ?

## 2021-06-22 NOTE — [place] FL2 (Signed)
?[place] MEDICAID FL2 LEVEL OF CARE SCREENING TOOL  ?  ? ?IDENTIFICATION  ?Patient Name: ?Allison Mclean Birthdate: 1935/08/26 Sex: female Admission Date (Current Location): ?06/20/2021  ?[place] and [place] Number: ? [place] ?  Facility and Address:  ?[facility], [address] [place], [place] [address] ?     Provider Number: ?[id number]  ?Attending Physician Name and Address:  ?Hessie Knows, MD ? Relative Name and Phone Number:  ?Merit, [place] (Son)   [phone] [facility] Phone) Contact Son first. ?   ?Current Level of Care: ?Hospital Recommended Level of Care: ?[place] Prior Approval Number: ?  ? ?Date Approved/Denied: ?12/26/21 PASRR Number: ?[id number] A ? ?Discharge Plan: ?SNF ?  ? ?Current Diagnoses: ?Patient Active Problem List  ? Diagnosis Date Noted  ? Status post total hip replacement, right 06/20/2021  ? Chronic systolic dysfunction of left ventricle 03/08/2018  ? Bilateral carotid artery stenosis 09/23/2017  ? Weakness of both lower extremities 09/08/2017  ? Kyphoscoliosis and scoliosis 06/02/2016  ? Obstruction of duodenum   ? Abnormal findings-gastrointestinal tract   ? Stricture, duodenum   ? Musculoskeletal pain 08/05/2015  ? Neuropathic pain 08/05/2015  ? Hypomagnesemia 07/10/2015  ? Thoracic spondylosis with radiculopathy (Left) 07/09/2015  ? Chronic pain 07/02/2015  ? Long term current use of opiate analgesic 07/02/2015  ? Long term prescription opiate use 07/02/2015  ? Opiate use (10 MME/day) 07/02/2015  ? Encounter for therapeutic drug level monitoring 07/02/2015  ? Encounter for pain management planning 07/02/2015  ? Osteopenia, senile 07/02/2015  ? Abnormal MRI, thoracic spine 07/02/2015  ? Thoracic central spinal stenosis (T7-8, T8-9, T9-10, T10-11, T11-12, and T12-L1) 07/02/2015  ? Thoracic foraminal stenosis (Moderate to severe at: Right T5; Bilateral T9; Left T11) 07/02/2015  ? Chronic thoracic radicular pain (T5/T6) (Left) 07/02/2015  ? History  of lumbar fusion (12/19/2013) (posterior lumbar fusion from L2-L5) 07/02/2015  ? Failed back surgical syndrome 07/02/2015  ? Thoracic facet syndrome (Left) 07/02/2015  ? History of fusion of cervical spine 07/02/2015  ? Postoperative back pain 07/02/2015  ? Nerve root pain 07/02/2015  ? Spinal stenosis of thoracic region 07/02/2015  ? Status post lumbar spine operation 07/02/2015  ? Atypical chest pain 02/05/2015  ? Cough 11/26/2014  ? Acid reflux 11/22/2014  ? 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 Essential (primary) hypertension 12/23/2013  ? Acquired spondylolisthesis 12/19/2013  ? HLD (hyperlipidemia) 11/07/2013  ? Supraventricular tachycardia ([place]) 11/07/2013  ? BP (high blood pressure) 11/07/2013  ? ? ?Orientation RESPIRATION BLADDER Height & Weight   ?  ?Self, Time, Situation, Place ? Normal Continent Weight: 81.6 kg ?Height:  5' (152.4 cm)  ?BEHAVIORAL SYMPTOMS/MOOD NEUROLOGICAL BOWEL NUTRITION STATUS  ?    Continent Diet  ?AMBULATORY STATUS COMMUNICATION OF NEEDS Skin   ?Extensive Assist Verbally Surgical wounds, Wound Vac ?  ?  ?  ?    ?     ?     ? ? ?Personal Care Assistance Level of Assistance  ?Bathing, Feeding, Dressing Bathing Assistance: Maximum assistance ?Feeding assistance: Limited assistance ?Dressing Assistance: Maximum assistance ?   ? ?Functional Limitations Info  ?Sight (Corrective glasses) Sight Info: Adequate ?  ?   ? ? ?SPECIAL CARE FACTORS FREQUENCY  ?PT (By licensed PT), OT (By licensed OT)   ?  ?PT Frequency: 5x/week ?OT Frequency: 5x/week ?  ?  ?  ?   ? ? ?Contractures Contractures Info: Not present  ? ? ?Additional Factors Info  ?Code Status, Allergies Code Status Info: Full  Code ?Allergies Info: Atorvastatin (Cheat Pain), Rosuvastatin (Chest Pain intolerance). Please also review chart to make sure no changes. ?  ?  ?  ?   ? ?Current Medications (06/22/2021):  This is the current hospital active medication list ?Current Facility-Administered Medications  ?Medication Dose Route Frequency  Provider Last Rate Last Admin  ? 0.9 %  sodium chloride infusion   Intravenous Continuous Hessie Knows, MD   Stopped at 06/21/21 [phone]  ? acetaminophen (TYLENOL) tablet 325-650 mg  325-650 mg Oral Q6H PRN Hessie Knows, MD   650 mg at 06/22/21 0001  ? alum & mag hydroxide-simeth (MAALOX/MYLANTA) 200-200-20 MG/5ML suspension 30 mL  30 mL Oral Q4H PRN Hessie Knows, MD      ? amLODipine (NORVASC) tablet 2.5 mg  2.5 mg Oral Claudine Mouton, Legrand Como, MD   2.5 mg at 06/22/21 0636  ? artificial tears (LACRILUBE) ophthalmic ointment 1 application.  1 application. Both Eyes QHS PRN Hessie Knows, MD      ? aspirin EC tablet 81 mg  81 mg Oral QHS Hessie Knows, MD   81 mg at 06/21/21 2105  ? bisacodyl (DULCOLAX) suppository 10 mg  10 mg Rectal Daily PRN Hessie Knows, MD      ? calcium-vitamin D Darron Doom WITH D) 500-5 MG-MCG per tablet 1 tablet  1 tablet Oral BID Hessie Knows, MD   1 tablet at 06/22/21 5631  ? diphenhydrAMINE (BENADRYL) 12.5 MG/5ML elixir 12.5-25 mg  12.5-25 mg Oral Q4H PRN Hessie Knows, MD      ? docusate sodium (COLACE) capsule 100 mg  100 mg Oral BID Hessie Knows, MD   100 mg at 06/22/21 4970  ? enoxaparin (LOVENOX) injection 40 mg  40 mg Subcutaneous Q24H Hessie Knows, MD   40 mg at 06/22/21 2637  ? fluticasone (FLONASE) 50 MCG/ACT nasal spray 2 spray  2 spray Each Nare QHS PRN Hessie Knows, MD      ? furosemide (LASIX) tablet 20 mg  20 mg Oral Daily PRN Hessie Knows, MD      ? irbesartan (AVAPRO) tablet 150 mg  150 mg Oral QPM Chinita Greenland A, RPH   150 mg at 06/21/21 1813  ? And  ? hydrochlorothiazide (HYDRODIURIL) tablet 12.5 mg  12.5 mg Oral QPM Chinita Greenland A, RPH   12.5 mg at 06/21/21 1813  ? HYDROcodone-acetaminophen (NORCO) 7.5-325 MG per tablet 1-2 tablet  1-2 tablet Oral Q4H PRN Hessie Knows, MD      ? HYDROcodone-acetaminophen (NORCO/VICODIN) 5-325 MG per tablet 1-2 tablet  1-2 tablet Oral Q4H PRN Hessie Knows, MD      ? menthol-cetylpyridinium (CEPACOL) lozenge 3 mg  1 lozenge Oral  PRN Hessie Knows, MD      ? Or  ? phenol (CHLORASEPTIC) mouth spray 1 spray  1 spray Mouth/Throat PRN Hessie Knows, MD      ? methocarbamol (ROBAXIN) tablet 500 mg  500 mg Oral Q6H PRN Hessie Knows, MD      ? Or  ? methocarbamol (ROBAXIN) 500 mg in dextrose 5 % 50 mL IVPB  500 mg Intravenous Q6H PRN Hessie Knows, MD      ? metoCLOPramide (REGLAN) tablet 5-10 mg  5-10 mg Oral Q8H PRN Hessie Knows, MD      ? Or  ? metoCLOPramide (REGLAN) injection 5-10 mg  5-10 mg Intravenous Q8H PRN Hessie Knows, MD   10 mg at 06/20/21 1623  ? morphine (PF) 2 MG/ML injection 0.5-1 mg  0.5-1 mg Intravenous Q2H PRN Hessie Knows,  MD      ? multivitamin with minerals tablet 1 tablet  1 tablet Oral Daily Hessie Knows, MD   1 tablet at 06/22/21 0827  ? neomycin-polymyxin b-dexamethasone (MAXITROL) ophthalmic suspension 1 drop  1 drop Right Eye Q6H Reche Dixon, PA-C   1 drop at 06/22/21 1207  ? nystatin (MYCOSTATIN/NYSTOP) topical powder 1 application.  1 g Topical Daily Hessie Knows, MD      ? ondansetron [facility]) tablet 4 mg  4 mg Oral Q6H PRN Hessie Knows, MD      ? Or  ? ondansetron [facility]) injection 4 mg  4 mg Intravenous Q6H PRN Hessie Knows, MD      ? pantoprazole (PROTONIX) EC tablet 40 mg  40 mg Oral Daily Hessie Knows, MD   40 mg at 06/22/21 0827  ? polyvinyl alcohol (LIQUIFILM TEARS) 1.4 % ophthalmic solution 1 drop  1 drop Both Eyes TID PRN Hessie Knows, MD      ? potassium chloride SA (KLOR-CON M) CR tablet 20 mEq  20 mEq Oral QPM Hessie Knows, MD   20 mEq at 06/21/21 1813  ? pravastatin (PRAVACHOL) tablet 80 mg  80 mg Oral QHS Hessie Knows, MD   80 mg at 06/21/21 2106  ? senna-docusate (Senokot-S) tablet 1 tablet  1 tablet Oral QHS PRN Hessie Knows, MD      ? sodium phosphate (FLEET) 7-19 GM/118ML enema 1 enema  1 enema Rectal Once PRN Hessie Knows, MD      ? traMADol Veatrice Bourbon) tablet 50 mg  50 mg Oral Q6H Hessie Knows, MD   50 mg at 06/22/21 1021  ? zolpidem (AMBIEN) tablet 5 mg  5 mg Oral QHS PRN Hessie Knows, MD      ? ? ? ?Discharge Medications: ?Please see discharge summary for a list of discharge medications. ? ?Relevant Imaging Results: ? ?Relevant Lab Results: ? ? ?Additional Information ?SSN: [id number]

## 2021-06-22 NOTE — Plan of Care (Signed)
?  Problem: Education: ?Goal: Knowledge of General Education information will improve ?Description: Including pain rating scale, medication(s)/side effects and non-pharmacologic comfort measures ?Outcome: Progressing ?  ?Problem: Clinical Measurements: ?Goal: Respiratory complications will improve ?Outcome: Progressing ?  ?Problem: Activity: ?Goal: Risk for activity intolerance will decrease ?Outcome: Progressing ?  ?Problem: Pain Managment: ?Goal: General experience of comfort will improve ?Outcome: Progressing ?  ?Problem: Education: ?Goal: Knowledge of the prescribed therapeutic regimen will improve ?Outcome: Progressing ?Goal: Understanding of discharge needs will improve ?Outcome: Progressing ?Goal: Individualized Educational Video(s) ?Outcome: Progressing ?  ?Problem: Activity: ?Goal: Ability to avoid complications of mobility impairment will improve ?Outcome: Progressing ?Goal: Ability to tolerate increased activity will improve ?Outcome: Progressing ?  ?Problem: Clinical Measurements: ?Goal: Postoperative complications will be avoided or minimized ?Outcome: Progressing ?  ?Problem: Pain Management: ?Goal: Pain level will decrease with appropriate interventions ?Outcome: Progressing ?  ?Problem: Skin Integrity: ?Goal: Will show signs of wound healing ?Outcome: Progressing ?  ?

## 2021-06-22 NOTE — Progress Notes (Signed)
?Subjective: ?2 Days Post-Op Procedure(s) (LRB): ?TOTAL HIP ARTHROPLASTY ANTERIOR APPROACH (Right) ?Patient reports pain as mild.   ?Patient is well, and has had no acute complaints or problems ?Plan is to go Rehab after hospital stay. ?Negative for chest pain and shortness of breath ?Fever: no ?Gastrointestinal: Negative for nausea and vomiting ? ?Objective: ?Vital signs in last 24 hours: ?Temp:  [97.7 ?F (36.5 ?C)-98.1 ?F (36.7 ?C)] 97.7 ?F (36.5 ?C) (03/19 0439) ?Pulse Rate:  [70-81] 81 (03/19 0439) ?Resp:  [16-18] 16 (03/19 0439) ?BP: (135-161)/(50-68) 161/68 (03/19 0439) ?SpO2:  [94 %-100 %] 100 % (03/19 0439) ? ?Intake/Output from previous day: ? ?Intake/Output Summary (Last 24 hours) at 06/22/2021 0715 ?Last data filed at 06/22/2021 0500 ?Gross per 24 hour  ?Intake --  ?Output 1200 ml  ?Net -1200 ml  ?  ?Intake/Output this shift: ?No intake/output data recorded. ? ?Labs: ?Recent Labs  ?  06/20/21 ?1801 06/21/21 ?0420 06/22/21 ?0421  ?HGB 14.7 12.2 11.6*  ? ?Recent Labs  ?  06/21/21 ?0420 06/22/21 ?0421  ?WBC 16.3* 12.2*  ?RBC 4.06 3.90  ?HCT 37.4 35.8*  ?PLT 236 225  ? ?Recent Labs  ?  06/20/21 ?1708 06/21/21 ?0420  ?NA  --  132*  ?K  --  4.0  ?CL  --  99  ?CO2  --  25  ?BUN  --  13  ?CREATININE 0.80 0.76  ?GLUCOSE  --  178*  ?CALCIUM  --  9.5  ? ?No results for input(s): LABPT, INR in the last 72 hours. ? ? ?EXAM ?General - Patient is Alert and Oriented ?Extremity - Neurovascular intact ?Sensation intact distally ?Dorsiflexion/Plantar flexion intact ?Compartment soft ?Dressing/Incision - clean, dry, with the wound VAC in place ?Motor Function - intact, moving foot and toes well on exam.  The patient ambulated 40 feet with physical therapy. ? ?Past Medical History:  ?Diagnosis Date  ? 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 AICD (automatic cardioverter/defibrillator) present 03/08/2018  ? a.) Aneth DR model [phone] (serial  Number [id number]) ICD  ? Coronary artery disease   ? a.) LHC  02/07/2015: EF 45-50; global HK; normal coronary anatomy; no CAD. b.) LHC 12/21/2017: DCM with EF 29%; 20% p-m RCA, 20% mLAD; no interventions.  ? Cortical senile cataract   ? Dilated cardiomyopathy ([place])   ? a.) TTE 01/26/2014: EF 50%. b.) TTE 09/16/2015: EF 40%. c.) TTE 05/14/2017: EF 30%. d.) TTE 06/28/2018: EF 30%.  ? DOE (dyspnea on exertion)   ? GERD (gastroesophageal reflux disease)   ? a.) resolved s/p Nissen fundoplication  ? HFrEF (heart failure with reduced ejection fraction) ([place])   ? a.) TTE 01/23/2014: EF 50%; global HK, mild LA dilation, mild LVH; triv MR, mild AR/TR/PR. b.) TTE 09/16/2015: EF 40%; mild LVH; mild LA dilation; global HK; mild panvalvular regurgitation. c.) TTE 05/14/2017: EF 30%; mod global HK, mild LA dilation, G1DD, triv AR/MR/PR, mild MR. d.) TTE 06/28/2018: EF 30%; mod HK; triv AR/PR, mild MR/TR; G1DD.  ? History of hiatal hernia   ? a.) s/p Nissen fundoplication  ? History of kidney stones   ? History of lumbar fusion (12/19/2013) (posterior lumbar fusion from L2-L5) 07/02/2015  ? Hyperlipidemia   ? Hypertension   ? IBS (irritable bowel syndrome)   ? [place] esophagus   ? NSVT (nonsustained ventricular tachycardia)   ? Osteoarthritis   ? Osteopenia, senile 07/02/2015  ? Skin cancer   ? SSS (sick sinus syndrome) ([place])   ? a.) s/p AICD  placement  ? Thoracic central spinal stenosis 07/02/2015  ? Thoracic facet syndrome (Left) 07/02/2015  ? Thoracic foraminal stenosis (Moderate to severe at: Right T5; Bilateral T9; Left T11) 07/02/2015  ? Thoracic spondylosis with radiculopathy (Left) 07/09/2015  ? Wears dentures   ? partial lower  ? ? ?Assessment/Plan: ?2 Days Post-Op Procedure(s) (LRB): ?TOTAL HIP ARTHROPLASTY ANTERIOR APPROACH (Right) ?Principal Problem: ?  Status post total hip replacement, right ? ?Estimated body mass index is 35.13 kg/m? as calculated from the following: ?  Height as of this encounter: 5' (1.524 m). ?  Weight as of this encounter: 81.6 kg. ?Advance diet ?Up  with therapy ?D/C IV fluids ? ?Discharge planning.  Patient would like to go to rehab.  Probable Monday. ? ?DVT Prophylaxis - Lovenox, Foot Pumps, and TED hose ?Weight-Bearing as tolerated to right leg ? ?Reche Dixon, PA-C ?Orthopaedic Surgery ?06/22/2021, 7:15 AM ? ?

## 2021-06-22 NOTE — Progress Notes (Signed)
12:03 ?Pt up to [facility], pt states she felt dizzy and lightheaded, BP 113/40.  ? ? ?Allison Mclean, [place] notified, orders received to check CMP. Order placed. ? ? ?12:40 ?Vitals now 134/54 HR 70 O2 96 room air  ? ? ?

## 2021-06-23 LAB — URINALYSIS, ROUTINE W REFLEX MICROSCOPIC
Bilirubin Urine: NEGATIVE
Glucose, UA: NEGATIVE mg/dL
Hgb urine dipstick: NEGATIVE
Ketones, ur: NEGATIVE mg/dL
Leukocytes,Ua: NEGATIVE
Nitrite: NEGATIVE
Protein, ur: NEGATIVE mg/dL
Specific Gravity, Urine: 1.01 (ref 1.005–1.030)
pH: 7 (ref 5.0–8.0)

## 2021-06-23 NOTE — Plan of Care (Signed)
?  Problem: Education: ?Goal: Knowledge of General Education information will improve ?Description: Including pain rating scale, medication(s)/side effects and non-pharmacologic comfort measures ?06/23/2021 0506 by Charisse March, LPN ?Outcome: Progressing ?06/23/2021 0506 by Charisse March, LPN ?Outcome: Progressing ?  ?Problem: Clinical Measurements: ?Goal: Respiratory complications will improve ?06/23/2021 0506 by Charisse March, LPN ?Outcome: Progressing ?06/23/2021 0506 by Charisse March, LPN ?Outcome: Progressing ?  ?Problem: Activity: ?Goal: Risk for activity intolerance will decrease ?06/23/2021 0506 by Charisse March, LPN ?Outcome: Progressing ?06/23/2021 0506 by Charisse March, LPN ?Outcome: Progressing ?  ?Problem: Pain Managment: ?Goal: General experience of comfort will improve ?06/23/2021 0506 by Charisse March, LPN ?Outcome: Progressing ?06/23/2021 0506 by Charisse March, LPN ?Outcome: Progressing ?  ?Problem: Education: ?Goal: Knowledge of the prescribed therapeutic regimen will improve ?06/23/2021 0506 by Charisse March, LPN ?Outcome: Progressing ?06/23/2021 0506 by Charisse March, LPN ?Outcome: Progressing ?Goal: Understanding of discharge needs will improve ?06/23/2021 0506 by Charisse March, LPN ?Outcome: Progressing ?06/23/2021 0506 by Charisse March, LPN ?Outcome: Progressing ?Goal: Individualized Educational Video(s) ?06/23/2021 0506 by Charisse March, LPN ?Outcome: Progressing ?06/23/2021 0506 by Charisse March, LPN ?Outcome: Progressing ?  ?Problem: Activity: ?Goal: Ability to avoid complications of mobility impairment will improve ?06/23/2021 0506 by Charisse March, LPN ?Outcome: Progressing ?06/23/2021 0506 by Charisse March, LPN ?Outcome: Progressing ?Goal: Ability to tolerate increased activity will improve ?06/23/2021 0506 by Charisse March, LPN ?Outcome: Progressing ?06/23/2021 0506 by Charisse March, LPN ?Outcome: Progressing ?  ?Problem: Clinical Measurements: ?Goal:  Postoperative complications will be avoided or minimized ?06/23/2021 0506 by Charisse March, LPN ?Outcome: Progressing ?06/23/2021 0506 by Charisse March, LPN ?Outcome: Progressing ?  ?Problem: Pain Management: ?Goal: Pain level will decrease with appropriate interventions ?06/23/2021 0506 by Charisse March, LPN ?Outcome: Progressing ?06/23/2021 0506 by Charisse March, LPN ?Outcome: Progressing ?  ?Problem: Skin Integrity: ?Goal: Will show signs of wound healing ?06/23/2021 0506 by Charisse March, LPN ?Outcome: Progressing ?06/23/2021 0506 by Charisse March, LPN ?Outcome: Progressing ?  ?

## 2021-06-23 NOTE — Progress Notes (Signed)
Physical Therapy Treatment ?Patient Details ?Name: Allison Mclean ?MRN: [id number] ?DOB: January 03, 1936 ?Today's Date: 06/23/2021 ? ? ?History of Present Illness Pt is an 86 y.o. female s/p R THA anterior approach secondary to primary OA 06/20/21.  PMH includes: CAD, HFrEF, NSVT, dilated cardiomyopathy (s/p AICD placement), sick sinus syndrome, HTN, HLD, DOE, GERD/hiatal hernia (status post Niesen fundoplication), anterior fusion c-spine, OA, thoracic spinal stenosis and facet syndrome (s/p fusion). ? ?  ?PT Comments  ? ? Pt is making good progress towards goals with ability to ambulate in hallway, however limited due to R knee pain. Agreeable to sit in recliner. Good tolerance for there-ex and motivated to participate. Will continue to progress.   ?Recommendations for follow up therapy are one component of a multi-disciplinary discharge planning process, led by the attending physician.  Recommendations may be updated based on patient status, additional functional criteria and insurance authorization. ? ?Follow Up Recommendations ? Skilled nursing-short term rehab (<3 hours/day) ?  ?  ?Assistance Recommended at Discharge Intermittent Supervision/Assistance  ?Patient can return home with the following A little help with walking and/or transfers;A little help with bathing/dressing/bathroom;Assist for transportation;Help with stairs or ramp for entrance ?  ?Equipment Recommendations ? Rolling walker (2 wheels);BSC/3in1  ?  ?Recommendations for Other Services OT consult ? ? ?  ?Precautions / Restrictions Precautions ?Precautions: Anterior Hip;Fall ?Precaution Booklet Issued: Yes (comment) ?Precaution Comments: wound vac ?Restrictions ?Weight Bearing Restrictions: Yes ?RLE Weight Bearing: Weight bearing as tolerated  ?  ? ?Mobility ? Bed Mobility ?Overal bed mobility: Needs Assistance ?Bed Mobility: Supine to Sit ?  ?  ?Supine to sit: Min assist ?  ?  ?General bed mobility comments: needs slight assist for B LE management. Once  seated at EOB, able to sit wtih upright posture ?  ? ?Transfers ?Overall transfer level: Needs assistance ?Equipment used: Rolling walker (2 wheels) ?Transfers: Sit to/from Stand ?Sit to Stand: Min guard, Supervision ?  ?  ?  ?  ?  ?General transfer comment: cues for hand placement ?  ? ?Ambulation/Gait ?Ambulation/Gait assistance: Min guard ?Gait Distance (Feet): 80 Feet ?Assistive device: Rolling walker (2 wheels) ?Gait Pattern/deviations: Step-to pattern, Decreased step length - right, Step-through pattern ?  ?  ?  ?General Gait Details: increased R knee pain feeling like it was giving way during ambulation. Distance limited due to complaint. Good speed and improved reciprocal gait pattern ? ? ?Stairs ?  ?  ?  ?  ?  ? ? ?Wheelchair Mobility ?  ? ?Modified Rankin (Stroke Patients Only) ?  ? ? ?  ?Balance Overall balance assessment: Needs assistance ?Sitting-balance support: No upper extremity supported, Feet supported ?Sitting balance-Leahy Scale: Good ?  ?  ?Standing balance support: Bilateral upper extremity supported ?Standing balance-Leahy Scale: Fair ?  ?  ?  ?  ?  ?  ?  ?  ?  ?  ?  ?  ?  ? ?  ?Cognition Arousal/Alertness: Awake/alert ?Behavior During Therapy: [facility] for tasks assessed/performed ?Overall Cognitive Status: Within Functional Limits for tasks assessed ?  ?  ?  ?  ?  ?  ?  ?  ?  ?  ?  ?  ?  ?  ?  ?  ?General Comments: alert and oriented x4 ?  ?  ? ?  ?Exercises Other Exercises ?Other Exercises: supine ther-ex performed on R LE including AP, glut sets, quad sets, SLRs, and LAQ. 12 reps with cga. Reviewed HEP and precautions ?Other Exercises: Ambulated to [facility]  with cga. Set up provided for patient. Requested to sit awhile, left with call bell and dialed CNA-no answer. ? ?  ?General Comments   ?  ?  ? ?Pertinent Vitals/Pain Pain Assessment ?Pain Assessment: No/denies pain ?Pain Score: 4  ?Pain Location: R hip ?Pain Descriptors / Indicators: Sore ?Pain Intervention(s): Limited activity within patient's  tolerance  ? ? ?Home Living   ?  ?  ?  ?  ?  ?  ?  ?  ?  ?   ?  ?Prior Function    ?  ?  ?   ? ?PT Goals (current goals can now be found in the care plan section) Acute Rehab PT Goals ?Patient Stated Goal: to improve mobility ?PT Goal Formulation: With patient ?Time For Goal Achievement: 07/04/21 ?Potential to Achieve Goals: Good ?Progress towards PT goals: Progressing toward goals ? ?  ?Frequency ? ? ? BID ? ? ? ?  ?PT Plan Current plan remains appropriate  ? ? ?Co-evaluation   ?  ?  ?  ?  ? ?  ?AM-PAC PT "6 Clicks" Mobility   ?Outcome Measure ? Help needed turning from your back to your side while in a flat bed without using bedrails?: A Little ?Help needed moving from lying on your back to sitting on the side of a flat bed without using bedrails?: A Little ?Help needed moving to and from a bed to a chair (including a wheelchair)?: A Little ?Help needed standing up from a chair using your arms (e.g., wheelchair or bedside chair)?: A Little ?Help needed to walk in hospital room?: A Little ?Help needed climbing 3-5 steps with a railing? : A Lot ?6 Click Score: 17 ? ?  ?End of Session Equipment Utilized During Treatment: Gait belt ?Activity Tolerance: Patient tolerated treatment well ?Patient left: in chair;with chair alarm set;with SCD's reapplied ?Nurse Communication: Mobility status ?PT Visit Diagnosis: Other abnormalities of gait and mobility (R26.89);Muscle weakness (generalized) (M62.81);Pain ?Pain - Right/Left: Right ?Pain - part of body: Hip ?  ? ? ?Time: 4431-5400 ?PT Time Calculation (min) (ACUTE ONLY): 23 min ? ?Charges:  $Gait Training: 8-22 mins ?$Therapeutic Exercise: 8-22 mins          ?          ? ?Greggory Stallion, PT, DPT, GCS ?[phone] ? ? ? ?Shana Zavaleta ?06/23/2021, 4:15 PM ? ?

## 2021-06-23 NOTE — Discharge Summary (Addendum)
Physician Discharge Summary  ?Patient ID: ?Allison Mclean ?MRN: [id number] ?DOB/AGE: 1935-10-22 86 y.o. ? ?Admit date: 06/20/2021 ?Discharge date: 06/25/2021 ? ?Admission Diagnoses:  ?Status post total hip replacement, right [Z96.641] ? ? ?Discharge Diagnoses: ?Patient Active Problem List  ? Diagnosis Date Noted  ? Status post total hip replacement, right 06/20/2021  ? Chronic systolic dysfunction of left ventricle 03/08/2018  ? Bilateral carotid artery stenosis 09/23/2017  ? Weakness of both lower extremities 09/08/2017  ? Kyphoscoliosis and scoliosis 06/02/2016  ? Obstruction of duodenum   ? Abnormal findings-gastrointestinal tract   ? Stricture, duodenum   ? Musculoskeletal pain 08/05/2015  ? Neuropathic pain 08/05/2015  ? Hypomagnesemia 07/10/2015  ? Thoracic spondylosis with radiculopathy (Left) 07/09/2015  ? Chronic pain 07/02/2015  ? Long term current use of opiate analgesic 07/02/2015  ? Long term prescription opiate use 07/02/2015  ? Opiate use (10 MME/day) 07/02/2015  ? Encounter for therapeutic drug level monitoring 07/02/2015  ? Encounter for pain management planning 07/02/2015  ? Osteopenia, senile 07/02/2015  ? Abnormal MRI, thoracic spine 07/02/2015  ? Thoracic central spinal stenosis (T7-8, T8-9, T9-10, T10-11, T11-12, and T12-L1) 07/02/2015  ? Thoracic foraminal stenosis (Moderate to severe at: Right T5; Bilateral T9; Left T11) 07/02/2015  ? Chronic thoracic radicular pain (T5/T6) (Left) 07/02/2015  ? History of lumbar fusion (12/19/2013) (posterior lumbar fusion from L2-L5) 07/02/2015  ? Failed back surgical syndrome 07/02/2015  ? Thoracic facet syndrome (Left) 07/02/2015  ? History of fusion of cervical spine 07/02/2015  ? Postoperative back pain 07/02/2015  ? Nerve root pain 07/02/2015  ? Spinal stenosis of thoracic region 07/02/2015  ? Status post lumbar spine operation 07/02/2015  ? Atypical chest pain 02/05/2015  ? Cough 11/26/2014  ? Acid reflux 11/22/2014  ? 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  Essential (primary) hypertension 12/23/2013  ? Acquired spondylolisthesis 12/19/2013  ? HLD (hyperlipidemia) 11/07/2013  ? Supraventricular tachycardia ([place]) 11/07/2013  ? BP (high blood pressure) 11/07/2013  ? ? ?Past Medical History:  ?Diagnosis Date  ? 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 AICD (automatic cardioverter/defibrillator) present 03/08/2018  ? a.) [place] DR model [id number] (serial  Number [id number]) ICD  ? Coronary artery disease   ? a.) LHC 02/07/2015: EF 45-50; global HK; normal coronary anatomy; no CAD. b.) LHC 12/21/2017: DCM with EF 29%; 20% p-m RCA, 20% mLAD; no interventions.  ? Cortical senile cataract   ? Dilated cardiomyopathy ([place])   ? a.) TTE 01/26/2014: EF 50%. b.) TTE 09/16/2015: EF 40%. c.) TTE 05/14/2017: EF 30%. d.) TTE 06/28/2018: EF 30%.  ? DOE (dyspnea on exertion)   ? GERD (gastroesophageal reflux disease)   ? a.) resolved s/p Nissen fundoplication  ? HFrEF (heart failure with reduced ejection fraction) ([place])   ? a.) TTE 01/23/2014: EF 50%; global HK, mild LA dilation, mild LVH; triv MR, mild AR/TR/PR. b.) TTE 09/16/2015: EF 40%; mild LVH; mild LA dilation; global HK; mild panvalvular regurgitation. c.) TTE 05/14/2017: EF 30%; mod global HK, mild LA dilation, G1DD, triv AR/MR/PR, mild MR. d.) TTE 06/28/2018: EF 30%; mod HK; triv AR/PR, mild MR/TR; G1DD.  ? History of hiatal hernia   ? a.) s/p Nissen fundoplication  ? History of kidney stones   ? History of lumbar fusion (12/19/2013) (posterior lumbar fusion from L2-L5) 07/02/2015  ? Hyperlipidemia   ? Hypertension   ? IBS (irritable bowel syndrome)   ? [place] esophagus   ? NSVT (nonsustained ventricular tachycardia)   ? Osteoarthritis   ?  Osteopenia, senile 07/02/2015  ? Skin cancer   ? SSS (sick sinus syndrome) ([facility])   ? a.) s/p AICD placement  ? Thoracic central spinal stenosis 07/02/2015  ? Thoracic facet syndrome (Left) 07/02/2015  ? Thoracic foraminal stenosis (Moderate to severe at: Right T5; Bilateral  T9; Left T11) 07/02/2015  ? Thoracic spondylosis with radiculopathy (Left) 07/09/2015  ? Wears dentures   ? partial lower  ? ?  ?Transfusion: none ?  ?Consultants (if any):  ? ?Discharged Condition: Improved ? ?Hospital Course: JODILYN GIESE is an 86 y.o. female who was admitted 06/20/2021 with a diagnosis of Status post total hip replacement, right and went to the operating room on 06/20/2021 and underwent the above named procedures.  ?  ?Surgeries: Procedure(s): ?TOTAL HIP ARTHROPLASTY ANTERIOR APPROACH on 06/20/2021 ?Patient tolerated the surgery well. Taken to PACU where she was stabilized and then transferred to the orthopedic floor. ? ?Started on Lovenox '40mg'$  q 24 hrs. Foot pumps applied bilaterally at 80 mm. Heels elevated on bed with rolled towels. No evidence of DVT. Negative [place]. ?Physical therapy started on day #1 for gait training and transfer. OT started day #1 for ADL and assisted devices. ? ?Patient's foley was d/c on day #1. Patient's IV was d/c on day #2. ? ?On post op day #3 patient was stable and ready for discharge to SNF.  On postop day 3 4 and 5 patient pending insurance authorization.  On postop day 5, patient is stable and ready for discharge to skilled nursing facility. ? ? ? ? ? ? ? ? ? ?ANTERIOR APPROACH TOTAL HIP REPLACEMENT POSTOPERATIVE DIRECTIONS ? ? ?Hip Rehabilitation, Guidelines Following Surgery  ?The results of a hip operation are greatly improved after range of motion and muscle strengthening exercises. Follow all safety measures which are given to protect your hip. If any of these exercises cause increased pain or swelling in your joint, decrease the amount until you are comfortable again. Then slowly increase the exercises. Call your caregiver if you have problems or questions.  ? ?HOME CARE INSTRUCTIONS  ?Remove items at home which could result in a fall. This includes throw rugs or furniture in walking pathways.  ?ICE to the affected hip every three hours for 30 minutes at a  time and then as needed for pain and swelling.  Continue to use ice on the hip for pain and swelling from surgery. You may notice swelling that will progress down to the foot and ankle.  This is normal after surgery.  Elevate the leg when you are not up walking on it.   ?Continue to use the breathing machine which will help keep your temperature down.  It is common for your temperature to cycle up and down following surgery, especially at night when you are not up moving around and exerting yourself.  The breathing machine keeps your lungs expanded and your temperature down. ?Do not place pillow under knee, focus on keeping the knee straight while resting ? ?DIET ?You may resume your previous home diet once your are discharged from the hospital. ? ?DRESSING / WOUND CARE / SHOWERING ?Please remove provena negative pressure dressing on 07/02/2021 and apply honey comb dressing. Keep dressing clean and dry at all times. ? ? ?ACTIVITY ?Walk with your walker as instructed. ?Use walker as long as suggested by your caregivers. ?Avoid periods of inactivity such as sitting longer than an hour when not asleep. This helps prevent blood clots.  ?You may resume a sexual relationship in  one month or when given the OK by your doctor.  ?You may return to work once you are cleared by your doctor.  ?Do not drive a car for 6 weeks or until released by you surgeon.  ?Do not drive while taking narcotics. ? ?WEIGHT BEARING ?Weight bearing as tolerated with assist device (walker, cane, etc) as directed, use it as long as suggested by your surgeon or therapist, typically at least 4-6 weeks. ? ?POSTOPERATIVE CONSTIPATION PROTOCOL ?Constipation - defined medically as fewer than three stools per week and severe constipation as less than one stool per week. ? ?One of the most common issues patients have following surgery is constipation.  Even if you have a regular bowel pattern at home, your normal regimen is likely to be disrupted due to  multiple reasons following surgery.  Combination of anesthesia, postoperative narcotics, change in appetite and fluid intake all can affect your bowels.  In order to avoid complications following surgery, here are s

## 2021-06-23 NOTE — TOC Progression Note (Signed)
Transition of Care (TOC) - Progression Note  ? ? ?Patient Details  ?Name: Allison Mclean ?MRN: [id number] ?Date of Birth: May 13, 1935 ? ?Transition of Care (TOC) CM/SW Contact  ?Conception Oms, RN ?Phone Number: ?06/23/2021, 11:08 AM ? ?Clinical Narrative:    ?No bed offers yet, expanded the search ? ? ?  ?  ? ?Expected Discharge Plan and Services ?  ?  ?  ?  ?  ?                ?  ?  ?  ?  ?  ?  ?  ?  ?  ?  ? ? ?Social Determinants of Health (SDOH) Interventions ?  ? ?Readmission Risk Interventions ?No flowsheet data found. ? ?

## 2021-06-23 NOTE — Progress Notes (Signed)
Physical Therapy Treatment ?Patient Details ?Name: Allison Mclean ?MRN: [id number] ?DOB: 02/27/1936 ?Today's Date: 06/23/2021 ? ? ?History of Present Illness Pt is an 86 y.o. female s/p R THA anterior approach secondary to primary OA 06/20/21.  PMH includes: CAD, HFrEF, NSVT, dilated cardiomyopathy (s/p AICD placement), sick sinus syndrome, HTN, HLD, DOE, GERD/hiatal hernia (status post Niesen fundoplication), anterior fusion c-spine, OA, thoracic spinal stenosis and facet syndrome (s/p fusion). ? ?  ?PT Comments  ? ? Pt is making good progress towards goals and is motivated to participate. Reports no dizziness this date. RW used with safe technique. Reviewed and performed supine there-ex. Will continue to progress as able.   ?Recommendations for follow up therapy are one component of a multi-disciplinary discharge planning process, led by the attending physician.  Recommendations may be updated based on patient status, additional functional criteria and insurance authorization. ? ?Follow Up Recommendations ? Skilled nursing-short term rehab (<3 hours/day) ?  ?  ?Assistance Recommended at Discharge Intermittent Supervision/Assistance  ?Patient can return home with the following A little help with walking and/or transfers;A little help with bathing/dressing/bathroom;Assist for transportation;Help with stairs or ramp for entrance ?  ?Equipment Recommendations ? Rolling walker (2 wheels);BSC/3in1  ?  ?Recommendations for Other Services OT consult ? ? ?  ?Precautions / Restrictions Precautions ?Precautions: Anterior Hip;Fall ?Precaution Booklet Issued: Yes (comment) ?Precaution Comments: wound vac ?Restrictions ?Weight Bearing Restrictions: Yes ?RLE Weight Bearing: Weight bearing as tolerated  ?  ? ?Mobility ? Bed Mobility ?Overal bed mobility: Needs Assistance ?Bed Mobility: Supine to Sit ?  ?  ?Supine to sit: Min assist ?  ?  ?General bed mobility comments: needs slight assist for B LE management. Once seated at EOB, able  to sit wtih upright posture ?  ? ?Transfers ?Overall transfer level: Needs assistance ?Equipment used: Rolling walker (2 wheels) ?Transfers: Sit to/from Stand ?Sit to Stand: Min guard, Supervision ?  ?  ?  ?  ?  ?General transfer comment: cues for hand placement ?  ? ?Ambulation/Gait ?Ambulation/Gait assistance: Min guard ?Gait Distance (Feet): 120 Feet ?Assistive device: Rolling walker (2 wheels) ?Gait Pattern/deviations: Step-to pattern, Decreased step length - right, Step-through pattern ?  ?  ?  ?General Gait Details: slightly improved gait speed this date. Does fatigue quickly with exertion. RW used with cues for upright posture ? ? ?Stairs ?  ?  ?  ?  ?  ? ? ?Wheelchair Mobility ?  ? ?Modified Rankin (Stroke Patients Only) ?  ? ? ?  ?Balance Overall balance assessment: Needs assistance ?Sitting-balance support: No upper extremity supported, Feet supported ?Sitting balance-Leahy Scale: Good ?  ?  ?Standing balance support: Bilateral upper extremity supported ?Standing balance-Leahy Scale: Fair ?  ?  ?  ?  ?  ?  ?  ?  ?  ?  ?  ?  ?  ? ?  ?Cognition Arousal/Alertness: Awake/alert ?Behavior During Therapy: [facility] for tasks assessed/performed ?Overall Cognitive Status: Within Functional Limits for tasks assessed ?  ?  ?  ?  ?  ?  ?  ?  ?  ?  ?  ?  ?  ?  ?  ?  ?General Comments: alert and oriented x4 ?  ?  ? ?  ?Exercises Other Exercises ?Other Exercises: supine ther-ex performed on R LE including AP, glut sets, SAQ, and hip abd/add. 12 reps with cga. Reviewed HEP and precautions ?Other Exercises: Ambulated to [facility] with cga. Set up provided for patient. Requested to sit awhile,  left with call bell and dialed CNA-no answer. ? ?  ?General Comments   ?  ?  ? ?Pertinent Vitals/Pain Pain Assessment ?Pain Assessment: 0-10 ?Pain Score: 4  ?Pain Location: R hip ?Pain Descriptors / Indicators: Sore ?Pain Intervention(s): Limited activity within patient's tolerance, Premedicated before session  ? ? ?Home Living   ?  ?  ?  ?  ?  ?   ?  ?  ?  ?   ?  ?Prior Function    ?  ?  ?   ? ?PT Goals (current goals can now be found in the care plan section) Acute Rehab PT Goals ?Patient Stated Goal: to improve mobility ?PT Goal Formulation: With patient ?Time For Goal Achievement: 07/04/21 ?Potential to Achieve Goals: Good ?Progress towards PT goals: Progressing toward goals ? ?  ?Frequency ? ? ? BID ? ? ? ?  ?PT Plan Current plan remains appropriate  ? ? ?Co-evaluation   ?  ?  ?  ?  ? ?  ?AM-PAC PT "6 Clicks" Mobility   ?Outcome Measure ? Help needed turning from your back to your side while in a flat bed without using bedrails?: A Little ?Help needed moving from lying on your back to sitting on the side of a flat bed without using bedrails?: A Little ?Help needed moving to and from a bed to a chair (including a wheelchair)?: A Little ?Help needed standing up from a chair using your arms (e.g., wheelchair or bedside chair)?: A Little ?Help needed to walk in hospital room?: A Little ?Help needed climbing 3-5 steps with a railing? : A Lot ?6 Click Score: 17 ? ?  ?End of Session Equipment Utilized During Treatment: Gait belt ?Activity Tolerance: Patient tolerated treatment well ?Patient left:  (seated on [facility] with call bell-instructed for safety) ?Nurse Communication: Mobility status ?PT Visit Diagnosis: Other abnormalities of gait and mobility (R26.89);Muscle weakness (generalized) (M62.81);Pain ?Pain - Right/Left: Right ?Pain - part of body: Hip ?  ? ? ?Time: 8003-4917 ?PT Time Calculation (min) (ACUTE ONLY): 23 min ? ?Charges:  $Gait Training: 8-22 mins ?$Therapeutic Exercise: 8-22 mins          ?          ? ?Greggory Stallion, PT, DPT, GCS ?[phone] ? ? ? ?Allison Mclean ?06/23/2021, 10:45 AM ? ?

## 2021-06-23 NOTE — Progress Notes (Signed)
Occupational Therapy Treatment ?Patient Details ?Name: Allison Mclean ?MRN: [id number] ?DOB: 1935/11/28 ?Today's Date: 06/23/2021 ? ? ?History of present illness Pt is an 86 y.o. female s/p R THA anterior approach secondary to primary OA 06/20/21.  PMH includes: CAD, HFrEF, NSVT, dilated cardiomyopathy (s/p AICD placement), sick sinus syndrome, HTN, HLD, DOE, GERD/hiatal hernia (status post Niesen fundoplication), anterior fusion c-spine, OA, thoracic spinal stenosis and facet syndrome (s/p fusion). ?  ?OT comments ? Pt seen for brief OT session this afternoon, limited by pt's fatigue and recent return to bed after toileting. Pt noted to be scooted down in the bed. With bed flat and initial instruction with cues for hand placement and using LLE to assist in bridging, pt was able to pull herself up in the bed to improve positioning and comfort. HOB elevated to pt's comfort. Pt shares with OT her routines for ADL using AE/DME at home. No additional DME needs at this time. Pt progressing, continues to benefit.   ? ?Recommendations for follow up therapy are one component of a multi-disciplinary discharge planning process, led by the attending physician.  Recommendations may be updated based on patient status, additional functional criteria and insurance authorization. ?   ?Follow Up Recommendations ? Skilled nursing-short term rehab (<3 hours/day)  ?  ?Assistance Recommended at Discharge Frequent or constant Supervision/Assistance  ?Patient can return home with the following ? A lot of help with bathing/dressing/bathroom;A little help with walking and/or transfers;Assistance with cooking/housework;Assist for transportation;Help with stairs or ramp for entrance ?  ?Equipment Recommendations ? None recommended by OT;Other (comment) (pt has all equipment)  ?  ?Recommendations for Other Services   ? ?  ?Precautions / Restrictions Precautions ?Precautions: Anterior Hip;Fall ?Precaution Booklet Issued: No ?Precaution Comments:  wound vac ?Restrictions ?Weight Bearing Restrictions: Yes ?RLE Weight Bearing: Weight bearing as tolerated  ? ? ?  ? ?Mobility Bed Mobility ?  ?  ?  ?  ?  ?  ?  ?General bed mobility comments: pt declined EOB/OOB; wiht bed flat and initial instruction, pt was able to pull herself up in the bed to improve positioning and comfort ?  ? ?Transfers ?  ?  ?  ?  ?  ?  ?  ?  ?  ?  ?  ?  ?Balance   ?  ?  ?  ?  ?  ?  ?  ?  ?  ?  ?  ?  ?  ?  ?  ?  ?  ?  ?   ? ?ADL either performed or assessed with clinical judgement  ? ?ADL   ?  ?  ?  ?  ?  ?  ?  ?  ?  ?  ?  ?  ?  ?  ?  ?  ?  ?  ?  ?  ?  ? ?Extremity/Trunk Assessment   ?  ?  ?  ?  ?  ? ?Vision   ?  ?  ?Perception   ?  ?Praxis   ?  ? ?Cognition Arousal/Alertness: Awake/alert ?Behavior During Therapy: [facility] for tasks assessed/performed ?Overall Cognitive Status: Within Functional Limits for tasks assessed ?  ?  ?  ?  ?  ?  ?  ?  ?  ?  ?  ?  ?  ?  ?  ?  ?  ?  ?  ?   ?Exercises   ? ?  ?Shoulder Instructions   ? ? ?  ?General  Comments    ? ? ?Pertinent Vitals/ Pain       Pain Assessment ?Pain Assessment: No/denies pain ? ?Home Living   ?  ?  ?  ?  ?  ?  ?  ?  ?  ?  ?  ?  ?  ?  ?  ?  ?  ?  ? ?  ?Prior Functioning/Environment    ?  ?  ?  ?   ? ?Frequency ? Min 2X/week  ? ? ? ? ?  ?Progress Toward Goals ? ?OT Goals(current goals can now be found in the care plan section) ? Progress towards OT goals: Progressing toward goals ? ?Acute Rehab OT Goals ?Patient Stated Goal: go to rehab ?OT Goal Formulation: With patient ?Time For Goal Achievement: 07/05/21 ?Potential to Achieve Goals: Good  ?Plan Discharge plan remains appropriate;Frequency remains appropriate   ? ?Co-evaluation ? ? ?   ?  ?  ?  ?  ? ?  ?AM-PAC OT "6 Clicks" Daily Activity     ?Outcome Measure ? ? Help from another person eating meals?: None ?Help from another person taking care of personal grooming?: A Little ?Help from another person toileting, which includes using toliet, bedpan, or urinal?: A Lot ?Help from another  person bathing (including washing, rinsing, drying)?: A Lot ?Help from another person to put on and taking off regular upper body clothing?: A Little ?Help from another person to put on and taking off regular lower body clothing?: A Lot ?6 Click Score: 16 ? ?  ?End of Session   ? ?OT Visit Diagnosis: Unsteadiness on feet (R26.81);Muscle weakness (generalized) (M62.81) ?  ?Activity Tolerance Patient tolerated treatment well ?  ?Patient Left in bed;with call bell/phone within reach;with bed alarm set ?  ?Nurse Communication   ?  ? ?   ? ?Time: 9147-8295 ?OT Time Calculation (min): 10 min ? ?Charges: OT General Charges ?$OT Visit: 1 Visit ?OT Treatments ?$Self Care/Home Management : 8-22 mins ? ?Ardeth Perfect., MPH, MS, OTR/L ?ascom [phone] ?06/23/21, 5:25 PM ? ?

## 2021-06-23 NOTE — TOC Progression Note (Signed)
Transition of Care (TOC) - Progression Note  ? ? ?Patient Details  ?Name: Allison Mclean ?MRN: [id number] ?Date of Birth: April 14, 1935 ? ?Transition of Care (TOC) CM/SW Contact  ?Conception Oms, RN ?Phone Number: ?06/23/2021, 3:41 PM ? ?Clinical Narrative:   reviewed the bed offers with the patient, they accepted [place] started ? ? ? ?  ?  ? ?Expected Discharge Plan and Services ?  ?  ?  ?  ?  ?                ?  ?  ?  ?  ?  ?  ?  ?  ?  ?  ? ? ?Social Determinants of Health (SDOH) Interventions ?  ? ?Readmission Risk Interventions ?No flowsheet data found. ? ?

## 2021-06-23 NOTE — Progress Notes (Signed)
? ?Subjective: ?3 Days Post-Op Procedure(s) (LRB): ?TOTAL HIP ARTHROPLASTY ANTERIOR APPROACH (Right) ?Patient reports pain as mild.   ?Patient is well, and has had no acute complaints or problems ?Denies any CP, SOB, ABD pain. ?Patient with fever last night.  Afebrile this morning. ?We will continue therapy today.  ?Plan is to go Skilled nursing facility after hospital stay. ? ?Objective: ?Vital signs in last 24 hours: ?Temp:  [97.8 ?F (36.6 ?C)-101.4 ?F (38.6 ?C)] 97.9 ?F (36.6 ?C) (03/20 6734) ?Pulse Rate:  [58-92] 66 (03/20 0433) ?Resp:  [16-18] 16 (03/20 0433) ?BP: (113-149)/(40-59) 149/59 (03/20 0433) ?SpO2:  [94 %-100 %] 100 % (03/20 0433) ? ?Intake/Output from previous day: ?03/19 0701 - 03/20 0700 ?In: 360 [P.O.:360] ?Out: -  ?Intake/Output this shift: ?No intake/output data recorded. ? ?Recent Labs  ?  06/20/21 ?1801 06/21/21 ?0420 06/22/21 ?0421  ?HGB 14.7 12.2 11.6*  ? ?Recent Labs  ?  06/21/21 ?0420 06/22/21 ?0421  ?WBC 16.3* 12.2*  ?RBC 4.06 3.90  ?HCT 37.4 35.8*  ?PLT 236 225  ? ?Recent Labs  ?  06/21/21 ?0420 06/22/21 ?1427  ?NA 132* 133*  ?K 4.0 4.0  ?CL 99 99  ?CO2 25 24  ?BUN 13 17  ?CREATININE 0.76 0.84  ?GLUCOSE 178* 148*  ?CALCIUM 9.5 9.6  ? ?No results for input(s): LABPT, INR in the last 72 hours. ? ?EXAM ?General - Patient is Alert, Appropriate, and Oriented ?Extremity - Neurovascular intact ?Sensation intact distally ?Intact pulses distally ?Dorsiflexion/Plantar flexion intact ?No cellulitis present ?Compartment soft ?Dressing - dressing C/D/I and no drainage, Praveena intact with scant drainage ?Motor Function - intact, moving foot and toes well on exam.  ? ?Past Medical History:  ?Diagnosis Date  ? 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 AICD (automatic cardioverter/defibrillator) present 03/08/2018  ? a.) [place] [address] model [id number] (serial  Number [id number]) ICD  ? Coronary artery disease   ? a.) LHC 02/07/2015: EF 45-50; global HK; normal coronary anatomy; no CAD. b.) LHC  12/21/2017: DCM with EF 29%; 20% p-m RCA, 20% mLAD; no interventions.  ? Cortical senile cataract   ? Dilated cardiomyopathy ([place])   ? a.) TTE 01/26/2014: EF 50%. b.) TTE 09/16/2015: EF 40%. c.) TTE 05/14/2017: EF 30%. d.) TTE 06/28/2018: EF 30%.  ? DOE (dyspnea on exertion)   ? GERD (gastroesophageal reflux disease)   ? a.) resolved s/p Nissen fundoplication  ? HFrEF (heart failure with reduced ejection fraction) ([place])   ? a.) TTE 01/23/2014: EF 50%; global HK, mild LA dilation, mild LVH; triv MR, mild AR/TR/PR. b.) TTE 09/16/2015: EF 40%; mild LVH; mild LA dilation; global HK; mild panvalvular regurgitation. c.) TTE 05/14/2017: EF 30%; mod global HK, mild LA dilation, G1DD, triv AR/MR/PR, mild MR. d.) TTE 06/28/2018: EF 30%; mod HK; triv AR/PR, mild MR/TR; G1DD.  ? History of hiatal hernia   ? a.) s/p Nissen fundoplication  ? History of kidney stones   ? History of lumbar fusion (12/19/2013) (posterior lumbar fusion from L2-L5) 07/02/2015  ? Hyperlipidemia   ? Hypertension   ? IBS (irritable bowel syndrome)   ? [place] esophagus   ? NSVT (nonsustained ventricular tachycardia)   ? Osteoarthritis   ? Osteopenia, senile 07/02/2015  ? Skin cancer   ? SSS (sick sinus syndrome) ([facility])   ? a.) s/p AICD placement  ? Thoracic central spinal stenosis 07/02/2015  ? Thoracic facet syndrome (Left) 07/02/2015  ? Thoracic foraminal stenosis (Moderate to severe at: Right T5; Bilateral T9; Left  T11) 07/02/2015  ? Thoracic spondylosis with radiculopathy (Left) 07/09/2015  ? Wears dentures   ? partial lower  ? ? ?Assessment/Plan:   ?3 Days Post-Op Procedure(s) (LRB): ?TOTAL HIP ARTHROPLASTY ANTERIOR APPROACH (Right) ?Principal Problem: ?  Status post total hip replacement, right ? ?Estimated body mass index is 35.13 kg/m? as calculated from the following: ?  Height as of this encounter: 5' (1.524 m). ?  Weight as of this encounter: 81.6 kg. ?Advance diet ?Up with therapy ?Continue to work on bowel movement ?Labs and vital signs  are stable ?Patient with elevated temp last night, will check UA this morning. ?Care management to assist with discharge to skilled nursing facility.  Patient stable and ready for discharge to skilled nursing facility today pending bed availability/insurance ? ?DVT Prophylaxis - Lovenox, TED hose, and SCDs ?Weight-Bearing as tolerated to right leg ? ? ?T. Rachelle Hora, PA-C ?[place] ?06/23/2021, 7:55 AM ?  ?

## 2021-06-24 LAB — SURGICAL PATHOLOGY

## 2021-06-24 NOTE — Progress Notes (Signed)
Occupational Therapy Treatment ?Patient Details ?Name: Allison Mclean ?MRN: [id number] ?DOB: 12-01-1935 ?Today's Date: 06/24/2021 ? ? ?History of present illness Pt is an 86 y.o. female s/p R THA anterior approach secondary to primary OA 06/20/21.  PMH includes: CAD, HFrEF, NSVT, dilated cardiomyopathy (s/p AICD placement), sick sinus syndrome, HTN, HLD, DOE, GERD/hiatal hernia (status post Niesen fundoplication), anterior fusion c-spine, OA, thoracic spinal stenosis and facet syndrome (s/p fusion). ?  ?OT comments ? Allison Mclean was seen for OT treatment on this date. Upon arrival to room pt awake/alert, seated in room recliner, and agreeable to OT tx session. Pt endorses moderate R hip/knee pain but is motivated to perform standing grooming tasks at sink. OT facilitated functional activity as described below (see ADL section for additional detail). Pt requires close CGA for functional mobility 2/2 R knee buckling during session, and supervision for standing grooming tasks. Pt return demonstrates/verbalizes understanding of safety and falls prevention and compensatory strategies t/o session. Pt making good progress toward goals and continues to benefit from skilled OT services to maximize return to PLOF and minimize risk of future falls, injury, caregiver burden, and readmission. Will continue to follow POC as written. Discharge recommendation remains appropriate.  ?  ? ?Recommendations for follow up therapy are one component of a multi-disciplinary discharge planning process, led by the attending physician.  Recommendations may be updated based on patient status, additional functional criteria and insurance authorization. ?   ?Follow Up Recommendations ? Skilled nursing-short term rehab (<3 hours/day)  ?  ?Assistance Recommended at Discharge Frequent or constant Supervision/Assistance  ?Patient can return home with the following ? A lot of help with bathing/dressing/bathroom;A little help with walking and/or  transfers;Assistance with cooking/housework;Assist for transportation;Help with stairs or ramp for entrance ?  ?Equipment Recommendations ? None recommended by OT;Other (comment)  ?  ?Recommendations for Other Services   ? ?  ?Precautions / Restrictions Precautions ?Precautions: Anterior Hip;Fall ?Precaution Booklet Issued: No ?Precaution Comments: wound vac ?Restrictions ?Weight Bearing Restrictions: Yes ?RLE Weight Bearing: Weight bearing as tolerated  ? ? ?  ? ?Mobility Bed Mobility ?Overal bed mobility: Needs Assistance ?  ?  ?  ?  ?  ?  ?General bed mobility comments: deferred. Pt in recliner at start/end of session. ?  ? ?Transfers ?Overall transfer level: Needs assistance ?Equipment used: Rolling walker (2 wheels) ?Transfers: Sit to/from Stand ?Sit to Stand: Min guard ?  ?  ?  ?  ?  ?  ?  ?  ?Balance Overall balance assessment: Needs assistance ?Sitting-balance support: No upper extremity supported, Feet supported ?Sitting balance-Leahy Scale: Good ?Sitting balance - Comments: steady sitting, reaching outside BOS. ?  ?Standing balance support: No upper extremity supported, During functional activity ?Standing balance-Leahy Scale: Good ?Standing balance comment: steady static standing, reaching within BOS during functional activity without UE support. ?  ?  ?  ?  ?  ?  ?  ?  ?  ?  ?  ?   ? ?ADL either performed or assessed with clinical judgement  ? ?ADL Overall ADL's : Needs assistance/impaired ?  ?  ?Grooming: Standing;Oral care;Wash/dry face;Set up;Supervision/safety;Brushing hair ?Grooming Details (indicate cue type and reason): Pt performs standing grooming tasks above with SET-UP assist while standing at sink. She demonstrates good functional reach within BOS and static standing balance during functional activity this date. She is able to stand at sink for ~10 min with no adverse s/s reported during session. ?  ?  ?  ?  ?  ?  ?  ?  ?  ?  ?  ?  ?  ?  ?  Functional mobility during ADLs: Min guard;Rolling  walker (2 wheels) ?General ADL Comments: x1 instance of R knee buckling during functional mobility in room this date, pt self-corrects without cueing. Safe use of RW with good recall/carryover from past sessions. MIN GUARD provided t/o session for safety. ?  ? ?Extremity/Trunk Assessment Upper Extremity Assessment ?Upper Extremity Assessment: Overall WFL for tasks assessed ?  ?Lower Extremity Assessment ?RLE Deficits / Details: s/p R THA ?  ?  ?  ? ?Vision Patient Visual Report: No change from baseline ?  ?  ?Perception   ?  ?Praxis   ?  ? ?Cognition Arousal/Alertness: Awake/alert ?Behavior During Therapy: [facility] for tasks assessed/performed ?Overall Cognitive Status: Within Functional Limits for tasks assessed ?  ?  ?  ?  ?  ?  ?  ?  ?  ?  ?  ?  ?  ?  ?  ?  ?  ?  ?  ?   ?Exercises Other Exercises ?Other Exercises: OT facilitated funcitonal mobility and standing grooming tasks as described above (See ADL section for additional detail). Pt educated on safety, falls prevention, and compensatory strategies t/o session to maximize functional independence. She return demos excellent understanding from past OT sessions. ? ?  ?Shoulder Instructions   ? ? ?  ?General Comments    ? ? ?Pertinent Vitals/ Pain       Pain Assessment ?Pain Assessment: Faces ?Faces Pain Scale: Hurts little more ?Pain Location: R knee/hip ?Pain Descriptors / Indicators: Sore, Guarding ?Pain Intervention(s): Limited activity within patient's tolerance, Monitored during session, Repositioned ? ?Home Living   ?  ?  ?  ?  ?  ?  ?  ?  ?  ?  ?  ?  ?  ?  ?  ?  ?  ?  ? ?  ?Prior Functioning/Environment    ?  ?  ?  ?   ? ?Frequency ? Min 2X/week  ? ? ? ? ?  ?Progress Toward Goals ? ?OT Goals(current goals can now be found in the care plan section) ? Progress towards OT goals: Progressing toward goals ? ?Acute Rehab OT Goals ?Patient Stated Goal: to go to rehab ?OT Goal Formulation: With patient ?Time For Goal Achievement: 07/05/21 ?Potential to Achieve Goals:  Good  ?Plan Discharge plan remains appropriate;Frequency remains appropriate   ? ?Co-evaluation ? ? ?   ?  ?  ?  ?  ? ?  ?AM-PAC OT "6 Clicks" Daily Activity     ?Outcome Measure ? ? Help from another person eating meals?: None ?Help from another person taking care of personal grooming?: A Little ?Help from another person toileting, which includes using toliet, bedpan, or urinal?: A Lot ?Help from another person bathing (including washing, rinsing, drying)?: A Lot ?Help from another person to put on and taking off regular upper body clothing?: A Little ?Help from another person to put on and taking off regular lower body clothing?: A Lot ?6 Click Score: 16 ? ?  ?End of Session Equipment Utilized During Treatment: Gait belt;Rolling walker (2 wheels) ? ?OT Visit Diagnosis: Unsteadiness on feet (R26.81);Muscle weakness (generalized) (M62.81) ?  ?Activity Tolerance Patient tolerated treatment well ?  ?Patient Left in bed;with call bell/phone within reach;with bed alarm set ?  ?Nurse Communication Mobility status ?  ? ?   ? ?Time: 3299-2426 ?OT Time Calculation (min): 26 min ? ?Charges: OT General Charges ?$OT Visit: 1 Visit ?OT Treatments ?$Self Care/Home Management : 23-37 mins ? ?Levaeh Vice  Roosvelt Maser, M.S., OTR/L ?[facility] ?Ascom: [phone] ?06/24/21, 10:52 AM ? ?

## 2021-06-24 NOTE — Progress Notes (Signed)
Physical Therapy Treatment ?Patient Details ?Name: Allison Mclean ?MRN: [id number] ?DOB: 1936/03/13 ?Today's Date: 06/24/2021 ? ? ?History of Present Illness Pt is an 86 y.o. female s/p R THA anterior approach secondary to primary OA 06/20/21.  PMH includes: CAD, HFrEF, NSVT, dilated cardiomyopathy (s/p AICD placement), sick sinus syndrome, HTN, HLD, DOE, GERD/hiatal hernia (status post Niesen fundoplication), anterior fusion c-spine, OA, thoracic spinal stenosis and facet syndrome (s/p fusion). ? ?  ?PT Comments  ? ? Pt is making good progress towards goals with ability to ambulate in hallway using RW. Good endurance with HEP. Will continue to progress. Pt frustrated that she hasn't left to go to SNF yet. Active listening performed.   ?Recommendations for follow up therapy are one component of a multi-disciplinary discharge planning process, led by the attending physician.  Recommendations may be updated based on patient status, additional functional criteria and insurance authorization. ? ?Follow Up Recommendations ? Skilled nursing-short term rehab (<3 hours/day) ?  ?  ?Assistance Recommended at Discharge Intermittent Supervision/Assistance  ?Patient can return home with the following A little help with walking and/or transfers;A little help with bathing/dressing/bathroom;Assist for transportation;Help with stairs or ramp for entrance ?  ?Equipment Recommendations ? Rolling walker (2 wheels);BSC/3in1  ?  ?Recommendations for Other Services   ? ? ?  ?Precautions / Restrictions Precautions ?Precautions: Anterior Hip;Fall ?Precaution Booklet Issued: Yes (comment) ?Precaution Comments: wound vac ?Restrictions ?Weight Bearing Restrictions: Yes ?RLE Weight Bearing: Weight bearing as tolerated  ?  ? ?Mobility ? Bed Mobility ?  ?  ?  ?  ?  ?  ?  ?General bed mobility comments: deferred. Pt in recliner at start/end of session. ?  ? ?Transfers ?Overall transfer level: Needs assistance ?Equipment used: Rolling walker (2  wheels) ?Transfers: Sit to/from Stand ?Sit to Stand: Min guard ?  ?  ?  ?  ?  ?General transfer comment: cues for hand placement ?  ? ?Ambulation/Gait ?Ambulation/Gait assistance: Min guard ?Gait Distance (Feet): 80 Feet ?Assistive device: Rolling walker (2 wheels) ?Gait Pattern/deviations: Step-through pattern ?  ?  ?  ?General Gait Details: able to ambulate short distance in hallway with RW. Able to progress to reciprocal gait pattern this session ? ? ?Stairs ?  ?  ?  ?  ?  ? ? ?Wheelchair Mobility ?  ? ?Modified Rankin (Stroke Patients Only) ?  ? ? ?  ?Balance Overall balance assessment: Needs assistance ?Sitting-balance support: No upper extremity supported, Feet supported ?Sitting balance-Leahy Scale: Good ?  ?  ?Standing balance support: No upper extremity supported, During functional activity ?Standing balance-Leahy Scale: Good ?  ?  ?  ?  ?  ?  ?  ?  ?  ?  ?  ?  ?  ? ?  ?Cognition Arousal/Alertness: Awake/alert ?Behavior During Therapy: [facility] for tasks assessed/performed ?Overall Cognitive Status: Within Functional Limits for tasks assessed ?  ?  ?  ?  ?  ?  ?  ?  ?  ?  ?  ?  ?  ?  ?  ?  ?General Comments: alert and oriented x4 ?  ?  ? ?  ?Exercises Other Exercises ?Other Exercises: seated ther-ex performed on R LE including AP, quad sets, SLRs, and LAQ. 15 reps with cga. Safe technique. Reviewed HEP and precautions ?Other Exercises: transferred to [facility] with cga. Safe technique ? ?  ?General Comments   ?  ?  ? ?Pertinent Vitals/Pain Pain Assessment ?Pain Assessment: Faces ?Faces Pain Scale: Hurts a little  bit ?Pain Location: R knee/hip ?Pain Descriptors / Indicators: Sore, Guarding ?Pain Intervention(s): Limited activity within patient's tolerance, Repositioned  ? ? ?Home Living   ?  ?  ?  ?  ?  ?  ?  ?  ?  ?   ?  ?Prior Function    ?  ?  ?   ? ?PT Goals (current goals can now be found in the care plan section) Acute Rehab PT Goals ?Patient Stated Goal: to improve mobility ?PT Goal Formulation: With  patient ?Time For Goal Achievement: 07/04/21 ?Potential to Achieve Goals: Good ?Progress towards PT goals: Progressing toward goals ? ?  ?Frequency ? ? ? BID ? ? ? ?  ?PT Plan Current plan remains appropriate  ? ? ?Co-evaluation   ?  ?  ?  ?  ? ?  ?AM-PAC PT "6 Clicks" Mobility   ?Outcome Measure ? Help needed turning from your back to your side while in a flat bed without using bedrails?: A Little ?Help needed moving from lying on your back to sitting on the side of a flat bed without using bedrails?: A Little ?Help needed moving to and from a bed to a chair (including a wheelchair)?: A Little ?Help needed standing up from a chair using your arms (e.g., wheelchair or bedside chair)?: A Little ?Help needed to walk in hospital room?: A Little ?Help needed climbing 3-5 steps with a railing? : A Lot ?6 Click Score: 17 ? ?  ?End of Session Equipment Utilized During Treatment: Gait belt ?Activity Tolerance: Patient tolerated treatment well ?Patient left:  (left on BSC, instructed to call RN for assist) ?Nurse Communication: Mobility status ?PT Visit Diagnosis: Other abnormalities of gait and mobility (R26.89);Muscle weakness (generalized) (M62.81);Pain ?Pain - Right/Left: Right ?Pain - part of body: Hip ?  ? ? ?Time: 9924-2683 ?PT Time Calculation (min) (ACUTE ONLY): 23 min ? ?Charges:  $Gait Training: 8-22 mins ?$Therapeutic Exercise: 8-22 mins          ?          ? ?Greggory Stallion, PT, DPT, GCS ?[phone] ? ? ? ?Allison Mclean ?06/24/2021, 4:15 PM ? ?

## 2021-06-24 NOTE — Progress Notes (Signed)
Physical Therapy Treatment ?Patient Details ?Name: Allison Mclean ?MRN: [id number] ?DOB: December 15, 1935 ?Today's Date: 06/24/2021 ? ? ?History of Present Illness Pt is an 86 y.o. female s/p R THA anterior approach secondary to primary OA 06/20/21.  PMH includes: CAD, HFrEF, NSVT, dilated cardiomyopathy (s/p AICD placement), sick sinus syndrome, HTN, HLD, DOE, GERD/hiatal hernia (status post Niesen fundoplication), anterior fusion c-spine, OA, thoracic spinal stenosis and facet syndrome (s/p fusion). ? ?  ?PT Comments  ? ? Pt is making limited progress towards goals secondary by R knee giveaway weakness. Very motivated to participate, needs occasional cues for sequencing and hand placement. Good endurance with there-ex. Will continue to progress as able.   ?Recommendations for follow up therapy are one component of a multi-disciplinary discharge planning process, led by the attending physician.  Recommendations may be updated based on patient status, additional functional criteria and insurance authorization. ? ?Follow Up Recommendations ? Skilled nursing-short term rehab (<3 hours/day) ?  ?  ?Assistance Recommended at Discharge Intermittent Supervision/Assistance  ?Patient can return home with the following A little help with walking and/or transfers;A little help with bathing/dressing/bathroom;Assist for transportation;Help with stairs or ramp for entrance ?  ?Equipment Recommendations ? Rolling walker (2 wheels);BSC/3in1  ?  ?Recommendations for Other Services OT consult ? ? ?  ?Precautions / Restrictions Precautions ?Precautions: Anterior Hip;Fall ?Precaution Booklet Issued: Yes (comment) ?Precaution Comments: wound vac ?Restrictions ?Weight Bearing Restrictions: Yes ?RLE Weight Bearing: Weight bearing as tolerated  ?  ? ?Mobility ? Bed Mobility ?  ?  ?  ?  ?  ?  ?  ?General bed mobility comments: received and left in recliner ?  ? ?Transfers ?Overall transfer level: Needs assistance ?Equipment used: Rolling walker (2  wheels) ?Transfers: Sit to/from Stand ?Sit to Stand: Min guard ?  ?  ?  ?  ?  ?General transfer comment: cues for hand placement ?  ? ?Ambulation/Gait ?Ambulation/Gait assistance: Min guard ?Gait Distance (Feet): 80 Feet ?Assistive device: Rolling walker (2 wheels) ?Gait Pattern/deviations: Step-to pattern, Decreased step length - right, Step-through pattern ?  ?  ?  ?General Gait Details: Still complains of R knee give away, limiting further distance. Step to gait pattern and keeps close to RW. Upright posture noted ? ? ?Stairs ?  ?  ?  ?  ?  ? ? ?Wheelchair Mobility ?  ? ?Modified Rankin (Stroke Patients Only) ?  ? ? ?  ?Balance Overall balance assessment: Needs assistance ?Sitting-balance support: No upper extremity supported, Feet supported ?Sitting balance-Leahy Scale: Good ?  ?  ?Standing balance support: Bilateral upper extremity supported ?Standing balance-Leahy Scale: Fair ?  ?  ?  ?  ?  ?  ?  ?  ?  ?  ?  ?  ?  ? ?  ?Cognition Arousal/Alertness: Awake/alert ?Behavior During Therapy: [facility] for tasks assessed/performed ?Overall Cognitive Status: Within Functional Limits for tasks assessed ?  ?  ?  ?  ?  ?  ?  ?  ?  ?  ?  ?  ?  ?  ?  ?  ?  ?  ?  ? ?  ?Exercises Other Exercises ?Other Exercises: seated ther-ex performed on R LE including AP, glut sets, hip add squeezes, and hip abd/add. 15 reps with cga. Safe technique. Reviewed HEP and precautions ? ?  ?General Comments   ?  ?  ? ?Pertinent Vitals/Pain Pain Assessment ?Pain Assessment: Faces ?Faces Pain Scale: Hurts little more ?Pain Location: R knee ?Pain Descriptors /  Indicators: Sore ?Pain Intervention(s): Limited activity within patient's tolerance, Repositioned  ? ? ?Home Living   ?  ?  ?  ?  ?  ?  ?  ?  ?  ?   ?  ?Prior Function    ?  ?  ?   ? ?PT Goals (current goals can now be found in the care plan section) Acute Rehab PT Goals ?Patient Stated Goal: to improve mobility ?PT Goal Formulation: With patient ?Time For Goal Achievement: 07/04/21 ?Potential to  Achieve Goals: Good ?Progress towards PT goals: Progressing toward goals ? ?  ?Frequency ? ? ? BID ? ? ? ?  ?PT Plan Current plan remains appropriate  ? ? ?Co-evaluation   ?  ?  ?  ?  ? ?  ?AM-PAC PT "6 Clicks" Mobility   ?Outcome Measure ? Help needed turning from your back to your side while in a flat bed without using bedrails?: A Little ?Help needed moving from lying on your back to sitting on the side of a flat bed without using bedrails?: A Little ?Help needed moving to and from a bed to a chair (including a wheelchair)?: A Little ?Help needed standing up from a chair using your arms (e.g., wheelchair or bedside chair)?: A Little ?Help needed to walk in hospital room?: A Little ?Help needed climbing 3-5 steps with a railing? : A Lot ?6 Click Score: 17 ? ?  ?End of Session Equipment Utilized During Treatment: Gait belt ?Activity Tolerance: Patient tolerated treatment well ?Patient left: in chair;with chair alarm set;with SCD's reapplied ?Nurse Communication: Mobility status ?PT Visit Diagnosis: Other abnormalities of gait and mobility (R26.89);Muscle weakness (generalized) (M62.81);Pain ?Pain - Right/Left: Right ?Pain - part of body: Hip ?  ? ? ?Time: 5974-1638 ?PT Time Calculation (min) (ACUTE ONLY): 18 min ? ?Charges:  $Gait Training: 8-22 mins          ?          ? ?Greggory Stallion, PT, DPT, GCS ?[phone] ? ? ? ?Jameire Kouba ?06/24/2021, 10:30 AM ? ?

## 2021-06-24 NOTE — Care Management Important Message (Signed)
Important Message ? ?Patient Details  ?Name: Allison Mclean ?MRN: [id number] ?Date of Birth: 02-Jun-1935 ? ? ?Medicare Important Message Given:  Yes ? ? ? ? ?Juliann Pulse A Aviraj Kentner ?06/24/2021, 10:58 AM ?

## 2021-06-24 NOTE — Progress Notes (Signed)
? ?Subjective: ?4 Days Post-Op Procedure(s) (LRB): ?TOTAL HIP ARTHROPLASTY ANTERIOR APPROACH (Right) ?Patient reports pain as mild.   ?Patient is doing well but having dysuria. Urinalysis neg yesterday. ?Denies any CP, SOB, ABD pain. ?Afebrile ?We will continue therapy today.  ?Plan is to go Skilled nursing facility after hospital stay. ? ?Objective: ?Vital signs in last 24 hours: ?Temp:  [97.5 ?F (36.4 ?C)-98.3 ?F (36.8 ?C)] 97.5 ?F (36.4 ?C) (03/21 0809) ?Pulse Rate:  [67-97] 67 (03/21 0809) ?Resp:  [16] 16 (03/21 0341) ?BP: (146-157)/(56-71) 155/71 (03/21 0809) ?SpO2:  [97 %-100 %] 100 % (03/21 0809) ? ?Intake/Output from previous day: ?03/20 0701 - 03/21 0700 ?In: 720 [P.O.:720] ?Out: 1300 [Urine:1300] ?Intake/Output this shift: ?No intake/output data recorded. ? ?Recent Labs  ?  06/22/21 ?0421  ?HGB 11.6*  ? ?Recent Labs  ?  06/22/21 ?0421  ?WBC 12.2*  ?RBC 3.90  ?HCT 35.8*  ?PLT 225  ? ?Recent Labs  ?  06/22/21 ?1427  ?NA 133*  ?K 4.0  ?CL 99  ?CO2 24  ?BUN 17  ?CREATININE 0.84  ?GLUCOSE 148*  ?CALCIUM 9.6  ? ?No results for input(s): LABPT, INR in the last 72 hours. ? ?EXAM ?General - Patient is Alert, Appropriate, and Oriented ?Dressing - dressing C/D/I and no drainage, Praveena intact with scant drainage ?Motor Function - intact, moving foot and toes well on exam.  ? ?Past Medical History:  ?Diagnosis Date  ? 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 AICD (automatic cardioverter/defibrillator) present 03/08/2018  ? a.) [place] DR model [id number] (serial  Number [id number]) ICD  ? Coronary artery disease   ? a.) LHC 02/07/2015: EF 45-50; global HK; normal coronary anatomy; no CAD. b.) LHC 12/21/2017: DCM with EF 29%; 20% p-m RCA, 20% mLAD; no interventions.  ? Cortical senile cataract   ? Dilated cardiomyopathy ([place])   ? a.) TTE 01/26/2014: EF 50%. b.) TTE 09/16/2015: EF 40%. c.) TTE 05/14/2017: EF 30%. d.) TTE 06/28/2018: EF 30%.  ? DOE (dyspnea on exertion)   ? GERD (gastroesophageal reflux  disease)   ? a.) resolved s/p Nissen fundoplication  ? HFrEF (heart failure with reduced ejection fraction) ([place])   ? a.) TTE 01/23/2014: EF 50%; global HK, mild LA dilation, mild LVH; triv MR, mild AR/TR/PR. b.) TTE 09/16/2015: EF 40%; mild LVH; mild LA dilation; global HK; mild panvalvular regurgitation. c.) TTE 05/14/2017: EF 30%; mod global HK, mild LA dilation, G1DD, triv AR/MR/PR, mild MR. d.) TTE 06/28/2018: EF 30%; mod HK; triv AR/PR, mild MR/TR; G1DD.  ? History of hiatal hernia   ? a.) s/p Nissen fundoplication  ? History of kidney stones   ? History of lumbar fusion (12/19/2013) (posterior lumbar fusion from L2-L5) 07/02/2015  ? Hyperlipidemia   ? Hypertension   ? IBS (irritable bowel syndrome)   ? Jackhammer esophagus   ? NSVT (nonsustained ventricular tachycardia)   ? Osteoarthritis   ? Osteopenia, senile 07/02/2015  ? Skin cancer   ? SSS (sick sinus syndrome) ([facility])   ? a.) s/p AICD placement  ? Thoracic central spinal stenosis 07/02/2015  ? Thoracic facet syndrome (Left) 07/02/2015  ? Thoracic foraminal stenosis (Moderate to severe at: Right T5; Bilateral T9; Left T11) 07/02/2015  ? Thoracic spondylosis with radiculopathy (Left) 07/09/2015  ? Wears dentures   ? partial lower  ? ? ?Assessment/Plan:   ?4 Days Post-Op Procedure(s) (LRB): ?TOTAL HIP ARTHROPLASTY ANTERIOR APPROACH (Right) ?Principal Problem: ?  Status post total hip replacement, right ? ?Estimated body  mass index is 35.13 kg/m? as calculated from the following: ?  Height as of this encounter: 5' (1.524 m). ?  Weight as of this encounter: 81.6 kg. ?Advance diet ?Up with therapy ?Labs and vital signs are stable ?Dysuria - UA -, continue to monitor. VSS afebrile. ?Care management to assist with discharge to skilled nursing facility.  Patient stable and ready for discharge to skilled nursing facility today pending bed availability/insurance ? ?DVT Prophylaxis - Lovenox, TED hose, and SCDs ?Weight-Bearing as tolerated to right leg ? ? ?T. Rachelle Hora, PA-C ?[place] ?06/24/2021, 9:26 AM ?  ?

## 2021-06-25 LAB — RESP PANEL BY RT-PCR (FLU A&B, COVID) ARPGX2
Influenza A by PCR: NEGATIVE
Influenza B by PCR: NEGATIVE
SARS Coronavirus 2 by RT PCR: NEGATIVE

## 2021-06-25 NOTE — TOC Transition Note (Signed)
Transition of Care (TOC) - CM/SW Discharge Note ? ? ?Patient Details  ?Name: Allison Mclean ?MRN: [id number] ?Date of Birth: [birth year] ? ?Transition of Care (TOC) CM/SW Contact:  ?Kedra Mcglade A Jabriel Vanduyne, LCSW ?Phone Number: ?06/25/2021, 10:57 AM ? ? ?Clinical Narrative:   Clinical Social Worker facilitated patient discharge including contacting patient family and facility to confirm patient discharge plans.  Clinical information faxed to facility and family agreeable with plan.  CSW arranged ambulance transport via [facility] to [facility] room 224 A .  RN to call [phone] for report prior to discharge. ? ? ?Final next level of care: [place] ?Barriers to Discharge: No Barriers Identified ? ? ?Patient Goals and CMS Choice ?  ?  ?  ? ?Discharge Placement ?  ?           ?Patient chooses bed at:  [facility]) ?Patient to be transferred to facility by: [facility] ?  ?Patient and family notified of of transfer: 06/25/21 ? ?Discharge Plan and Services ?  ?  ?           ?  ?  ?  ?  ?  ?  ?  ?  ?  ?  ? ?Social Determinants of Health (SDOH) Interventions ?  ? ? ?Readmission Risk Interventions ?   ? View : No data to display.  ?  ?  ?  ? ? ? ? ? ?

## 2021-06-25 NOTE — Progress Notes (Signed)
? ?Subjective: ?5 Days Post-Op Procedure(s) (LRB): ?TOTAL HIP ARTHROPLASTY ANTERIOR APPROACH (Right) ?Patient reports pain as mild.   ?Patient is doing well  ?Denies any CP, SOB, ABD pain. ?Afebrile ?We will continue therapy today.  ?Plan is to go Skilled nursing facility after hospital stay. ? ?Objective: ?Vital signs in last 24 hours: ?Temp:  [97.5 ?F (36.4 ?C)-98.3 ?F (36.8 ?C)] 98.2 ?F (36.8 ?C) (03/22 0739) ?Pulse Rate:  [67-90] 72 (03/22 0739) ?Resp:  [16-18] 16 (03/22 0739) ?BP: (125-155)/(51-84) 139/57 (03/22 0739) ?SpO2:  [93 %-100 %] 95 % (03/22 0739) ? ?Intake/Output from previous day: ?03/21 0701 - 03/22 0700 ?In: -  ?Out: 1250 [Urine:1250] ?Intake/Output this shift: ?Total I/O ?In: -  ?Out: 400 [Urine:400] ? ?No results for input(s): HGB in the last 72 hours. ? ?No results for input(s): WBC, RBC, HCT, PLT in the last 72 hours. ? ?Recent Labs  ?  06/22/21 ?1427  ?NA 133*  ?K 4.0  ?CL 99  ?CO2 24  ?BUN 17  ?CREATININE 0.84  ?GLUCOSE 148*  ?CALCIUM 9.6  ? ?No results for input(s): LABPT, INR in the last 72 hours. ? ?EXAM ?General - Patient is Alert, Appropriate, and Oriented ?Dressing - dressing C/D/I and no drainage, Praveena intact with scant drainage ?Motor Function - intact, moving foot and toes well on exam.  ? ?Past Medical History:  ?Diagnosis Date  ? 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 AICD (automatic cardioverter/defibrillator) present 03/08/2018  ? a.) [place] DR model [phone] (serial  Number [id number]) ICD  ? Coronary artery disease   ? a.) LHC 02/07/2015: EF 45-50; global HK; normal coronary anatomy; no CAD. b.) LHC 12/21/2017: DCM with EF 29%; 20% p-m RCA, 20% mLAD; no interventions.  ? Cortical senile cataract   ? Dilated cardiomyopathy ([place])   ? a.) TTE 01/26/2014: EF 50%. b.) TTE 09/16/2015: EF 40%. c.) TTE 05/14/2017: EF 30%. d.) TTE 06/28/2018: EF 30%.  ? DOE (dyspnea on exertion)   ? GERD (gastroesophageal reflux disease)   ? a.) resolved s/p Nissen fundoplication  ?  HFrEF (heart failure with reduced ejection fraction) ([place])   ? a.) TTE 01/23/2014: EF 50%; global HK, mild LA dilation, mild LVH; triv MR, mild AR/TR/PR. b.) TTE 09/16/2015: EF 40%; mild LVH; mild LA dilation; global HK; mild panvalvular regurgitation. c.) TTE 05/14/2017: EF 30%; mod global HK, mild LA dilation, G1DD, triv AR/MR/PR, mild MR. d.) TTE 06/28/2018: EF 30%; mod HK; triv AR/PR, mild MR/TR; G1DD.  ? History of hiatal hernia   ? a.) s/p Nissen fundoplication  ? History of kidney stones   ? History of lumbar fusion (12/19/2013) (posterior lumbar fusion from L2-L5) 07/02/2015  ? Hyperlipidemia   ? Hypertension   ? IBS (irritable bowel syndrome)   ? Jackhammer esophagus   ? NSVT (nonsustained ventricular tachycardia)   ? Osteoarthritis   ? Osteopenia, senile 07/02/2015  ? Skin cancer   ? SSS (sick sinus syndrome) ([facility])   ? a.) s/p AICD placement  ? Thoracic central spinal stenosis 07/02/2015  ? Thoracic facet syndrome (Left) 07/02/2015  ? Thoracic foraminal stenosis (Moderate to severe at: Right T5; Bilateral T9; Left T11) 07/02/2015  ? Thoracic spondylosis with radiculopathy (Left) 07/09/2015  ? Wears dentures   ? partial lower  ? ? ?Assessment/Plan:   ?5 Days Post-Op Procedure(s) (LRB): ?TOTAL HIP ARTHROPLASTY ANTERIOR APPROACH (Right) ?Principal Problem: ?  Status post total hip replacement, right ? ?Estimated body mass index is 35.13 kg/m? as calculated from the  following: ?  Height as of this encounter: 5' (1.524 m). ?  Weight as of this encounter: 81.6 kg. ?Advance diet ?Up with therapy ?Labs and vital signs are stable ?Care management to assist with discharge to skilled nursing facility.  Patient stable and ready for discharge to skilled nursing facility today pending bed availability/insurance ? ?DVT Prophylaxis - Lovenox, TED hose, and SCDs ?Weight-Bearing as tolerated to right leg ? ? ?T. Rachelle Hora, PA-C ?[place] ?06/25/2021, 8:05 AM ?  ?

## 2021-06-25 NOTE — Anesthesia Postprocedure Evaluation (Signed)
Anesthesia Post Note ? ?Patient: Allison Mclean ? ?Procedure(s) Performed: TOTAL HIP ARTHROPLASTY ANTERIOR APPROACH (Right: Hip) ? ?Patient location during evaluation: PACU ?Anesthesia Type: General ?Level of consciousness: awake and alert ?Pain management: pain level controlled ?Vital Signs Assessment: post-procedure vital signs reviewed and stable ?Respiratory status: spontaneous breathing, nonlabored ventilation, respiratory function stable and patient connected to nasal cannula oxygen ?Cardiovascular status: blood pressure returned to baseline and stable ?Postop Assessment: no apparent nausea or vomiting ?Anesthetic complications: no ? ? ?No notable events documented. ? ? ?Last Vitals:  ?Vitals:  ? 06/25/21 0518 06/25/21 0739  ?BP: (!) 147/51 (!) 139/57  ?Pulse: 73 72  ?Resp: 18 16  ?Temp: 36.8 ?C 36.8 ?C  ?SpO2: 95% 95%  ?  ?Last Pain:  ?Vitals:  ? 06/25/21 0708  ?TempSrc:   ?PainSc: 3   ? ? ?  ?  ?  ?  ?  ?  ? ?Molli Barrows ? ? ? ? ?

## 2021-06-25 NOTE — Progress Notes (Signed)
Physical Therapy Treatment ?Patient Details ?Name: Allison Mclean ?MRN: [id number] ?DOB: 28-Apr-1935 ?Today's Date: 06/25/2021 ? ? ?History of Present Illness Pt is an 86 y.o. female s/p R THA anterior approach secondary to primary OA 06/20/21.  PMH includes: CAD, HFrEF, NSVT, dilated cardiomyopathy (s/p AICD placement), sick sinus syndrome, HTN, HLD, DOE, GERD/hiatal hernia (status post Niesen fundoplication), anterior fusion c-spine, OA, thoracic spinal stenosis and facet syndrome (s/p fusion). ? ?  ?PT Comments  ? ? Pt is making good progress towards goals with ability to ambulate slightly increased distance this date. Reciprocal gait pattern and improved gait symmetry. Still reports R knee buckling during gait, however no formal LOB noted. Encouraged to speak to MD. No pain reported, able to performed standing R LE there-ex. Agreeable to sit in recliner. Will continue to progress as able.   ?Recommendations for follow up therapy are one component of a multi-disciplinary discharge planning process, led by the attending physician.  Recommendations may be updated based on patient status, additional functional criteria and insurance authorization. ? ?Follow Up Recommendations ? Skilled nursing-short term rehab (<3 hours/day) ?  ?  ?Assistance Recommended at Discharge Set up Supervision/Assistance  ?Patient can return home with the following A little help with walking and/or transfers;A little help with bathing/dressing/bathroom;Assist for transportation;Help with stairs or ramp for entrance ?  ?Equipment Recommendations ? Rolling walker (2 wheels);BSC/3in1  ?  ?Recommendations for Other Services OT consult ? ? ?  ?Precautions / Restrictions Precautions ?Precautions: Anterior Hip;Fall ?Precaution Booklet Issued: Yes (comment) ?Precaution Comments: wound vac ?Restrictions ?Weight Bearing Restrictions: Yes ?RLE Weight Bearing: Weight bearing as tolerated  ?  ? ?Mobility ? Bed Mobility ?Overal bed mobility: Needs  Assistance ?Bed Mobility: Supine to Sit ?  ?  ?Supine to sit: Min guard ?  ?  ?General bed mobility comments: improved technique with less physical assist required ?  ? ?Transfers ?Overall transfer level: Needs assistance ?Equipment used: Rolling walker (2 wheels) ?Transfers: Sit to/from Stand ?Sit to Stand: Min guard ?  ?  ?  ?  ?  ?General transfer comment: cues for hand placement ?  ? ?Ambulation/Gait ?Ambulation/Gait assistance: Min guard ?Gait Distance (Feet): 100 Feet ?Assistive device: Rolling walker (2 wheels) ?Gait Pattern/deviations: Step-through pattern ?  ?  ?  ?General Gait Details: able to ambulate in hallway with improved symmetrical gait pattern and safe technique. RW used with upright posture. Still reports R knee give away weakness, limited further progress ? ? ?Stairs ?  ?  ?  ?  ?  ? ? ?Wheelchair Mobility ?  ? ?Modified Rankin (Stroke Patients Only) ?  ? ? ?  ?Balance Overall balance assessment: Needs assistance ?Sitting-balance support: No upper extremity supported, Feet supported ?Sitting balance-Leahy Scale: Good ?Sitting balance - Comments: steady sitting, reaching outside BOS. ?  ?Standing balance support: No upper extremity supported, During functional activity ?Standing balance-Leahy Scale: Good ?  ?  ?  ?  ?  ?  ?  ?  ?  ?  ?  ?  ?  ? ?  ?Cognition Arousal/Alertness: Awake/alert ?Behavior During Therapy: [facility] for tasks assessed/performed ?Overall Cognitive Status: Within Functional Limits for tasks assessed ?  ?  ?  ?  ?  ?  ?  ?  ?  ?  ?  ?  ?  ?  ?  ?  ?  ?  ?  ? ?  ?Exercises Other Exercises ?Other Exercises: standing thre-ex performed on  LE including B heel raises, marching  on R LE, and R hip abd/add. B UE support, 10 reps. ? ?  ?General Comments   ?  ?  ? ?Pertinent Vitals/Pain Pain Assessment ?Pain Assessment: No/denies pain  ? ? ?Home Living   ?  ?  ?  ?  ?  ?  ?  ?  ?  ?   ?  ?Prior Function    ?  ?  ?   ? ?PT Goals (current goals can now be found in the care plan section) Acute  Rehab PT Goals ?Patient Stated Goal: to improve mobility ?PT Goal Formulation: With patient ?Time For Goal Achievement: 07/04/21 ?Potential to Achieve Goals: Good ?Progress towards PT goals: Progressing toward goals ? ?  ?Frequency ? ? ? BID ? ? ? ?  ?PT Plan Current plan remains appropriate  ? ? ?Co-evaluation   ?  ?  ?  ?  ? ?  ?AM-PAC PT "6 Clicks" Mobility   ?Outcome Measure ? Help needed turning from your back to your side while in a flat bed without using bedrails?: A Little ?Help needed moving from lying on your back to sitting on the side of a flat bed without using bedrails?: A Little ?Help needed moving to and from a bed to a chair (including a wheelchair)?: A Little ?Help needed standing up from a chair using your arms (e.g., wheelchair or bedside chair)?: A Little ?Help needed to walk in hospital room?: A Little ?Help needed climbing 3-5 steps with a railing? : A Lot ?6 Click Score: 17 ? ?  ?End of Session Equipment Utilized During Treatment: Gait belt ?Activity Tolerance: Patient tolerated treatment well ?Patient left: in chair;with chair alarm set;with SCD's reapplied ?Nurse Communication: Mobility status ?PT Visit Diagnosis: Other abnormalities of gait and mobility (R26.89);Muscle weakness (generalized) (M62.81);Pain ?Pain - Right/Left: Right ?Pain - part of body: Hip ?  ? ? ?Time: 0944-1000 ?PT Time Calculation (min) (ACUTE ONLY): 16 min ? ?Charges:  $Gait Training: 8-22 mins          ?          ? ?Greggory Stallion, PT, DPT, GCS ?[phone] ? ? ? ?Stefhanie Kachmar ?06/25/2021, 10:24 AM ? ?

## 2021-06-25 NOTE — TOC Progression Note (Signed)
Transition of Care (TOC) - Progression Note  ? ? ?Patient Details  ?Name: Allison Mclean ?MRN: [id number] ?Date of Birth: October 09, 1935 ? ?Transition of Care (TOC) CM/SW Contact  ?Zeena Starkel A Marcelle Bebout, LCSW ?Phone Number: ?06/25/2021, 10:22 AM ? ?Clinical Narrative:   Obtained Holli Humbles. Notified SNF and MD.  ? ? ? ?  ?  ? ?Expected Discharge Plan and Services ?  ?  ?  ?  ?  ?                ?  ?  ?  ?  ?  ?  ?  ?  ?  ?  ? ? ?Social Determinants of Health (SDOH) Interventions ?  ? ?Readmission Risk Interventions ?   ? View : No data to display.  ?  ?  ?  ? ? ?

## 2021-06-25 NOTE — Progress Notes (Signed)
Occupational Therapy Treatment ?Patient Details ?Name: Allison Mclean ?MRN: [id number] ?DOB: 1935/04/08 ?Today's Date: 06/25/2021 ? ? ?History of present illness Pt is an 86 y.o. female s/p R THA anterior approach secondary to primary OA 06/20/21.  PMH includes: CAD, HFrEF, NSVT, dilated cardiomyopathy (s/p AICD placement), sick sinus syndrome, HTN, HLD, DOE, GERD/hiatal hernia (status post Niesen fundoplication), anterior fusion c-spine, OA, thoracic spinal stenosis and facet syndrome (s/p fusion). ?  ?OT comments ? Chart reviewed to date, pt greeted on toilet in bathroom. Tx session targeted progressing functional mobility and independence during ADL task completion. Pt demonstrates improved functional mobility with RW in room as evidenced by requiring close sup, improved ADL task completion as evidenced by completing toileting/clothing management with MIN A. Pt notes R knee buckling on 1 occasion with no LOB noted with use of RW. Pt is left on edge of bed in care of RN, NAD. OT will continue to follow acutely.   ? ?Recommendations for follow up therapy are one component of a multi-disciplinary discharge planning process, led by the attending physician.  Recommendations may be updated based on patient status, additional functional criteria and insurance authorization. ?   ?Follow Up Recommendations ? Skilled nursing-short term rehab (<3 hours/day)  ?  ?Assistance Recommended at Discharge Frequent or constant Supervision/Assistance  ?Patient can return home with the following ? A lot of help with bathing/dressing/bathroom;A little help with walking and/or transfers;Assistance with cooking/housework;Assist for transportation;Help with stairs or ramp for entrance ?  ?Equipment Recommendations ? None recommended by OT;Other (comment) (per next venue of care)  ?  ?Recommendations for Other Services   ? ?  ?Precautions / Restrictions Precautions ?Precautions: Anterior Hip;Fall ?Precaution Comments: wound  vac ?Restrictions ?Weight Bearing Restrictions: Yes ?RLE Weight Bearing: Weight bearing as tolerated  ? ? ?  ? ? ?ADL either performed or assessed with clinical judgement  ? ?ADL Overall ADL's : Needs assistance/impaired ?  ?  ?  ?  ?  ?  ?  ?  ?  ?  ?  ?  ?  ?  ?  ?  ?  ?  ?  ?General ADL Comments: CGA for STS off toilet with RW(pt recieved on toilet), with MIN A required for clothing managment/toileting with use of RE; Pt noted with wound vac unplugged prior to OT entering room (while pt on toilet), RN notified and requests pt return to bed to adjust seal; Pt required CLOSE SUP for amb from bathroom to chair, then chair to bed with use of RW ?  ? ? ? ?Cognition Arousal/Alertness: Awake/alert ?Behavior During Therapy: [facility] for tasks assessed/performed ?Overall Cognitive Status: Within Functional Limits for tasks assessed ?  ?  ?  ?  ?  ?  ?  ?  ?  ?  ?  ?  ?  ?  ?  ?  ?  ?  ?  ?   ?   ?   ?   ? ? ?Pertinent Vitals/ Pain       Pain Assessment ?Pain Assessment: 0-10 ?Pain Score: 4  ?Pain Location: R knee/hip ?Pain Descriptors / Indicators: Sore, Guarding ?Pain Intervention(s): Limited activity within patient's tolerance, Repositioned ? ? ?Frequency ? Min 2X/week  ? ? ? ? ?  ?Progress Toward Goals ? ?OT Goals(current goals can now be found in the care plan section) ? Progress towards OT goals: Progressing toward goals ? ?Acute Rehab OT Goals ?Patient Stated Goal: go to rehab ?OT Goal Formulation: With patient ?Time  For Goal Achievement: 07/08/21 ?Potential to Achieve Goals: Good  ?Plan Discharge plan remains appropriate;Frequency remains appropriate   ? ?Co-evaluation ? ? ?   ?  ?  ?  ?  ? ?  ?AM-PAC OT "6 Clicks" Daily Activity     ?Outcome Measure ? ? Help from another person eating meals?: None ?Help from another person taking care of personal grooming?: A Little ?Help from another person toileting, which includes using toliet, bedpan, or urinal?: A Little ?Help from another person bathing (including washing,  rinsing, drying)?: A Lot ?Help from another person to put on and taking off regular upper body clothing?: A Little ?Help from another person to put on and taking off regular lower body clothing?: A Little ?6 Click Score: 18 ? ?  ?End of Session Equipment Utilized During Treatment: Rolling walker (2 wheels) ? ?OT Visit Diagnosis: Unsteadiness on feet (R26.81);Muscle weakness (generalized) (M62.81) ?  ?Activity Tolerance Patient tolerated treatment well ?  ?Patient Left in bed;with call bell/phone within reach;with nursing/sitter in room ?  ?Nurse Communication Mobility status ?  ? ?   ? ?Time: [phone] ?OT Time Calculation (min): 10 min ? ?Charges: OT General Charges ?$OT Visit: 1 Visit ?OT Treatments ?$Self Care/Home Management : 8-22 mins ? ?Shanon Payor, OTD OTR/L  ?06/25/21, 9:47 AM  ?

## 2021-07-14 LAB — CUP PACEART REMOTE DEVICE CHECK
Battery Remaining Longevity: 68 mo
Battery Remaining Percentage: 66 %
Battery Voltage: 2.96 V
Brady Statistic AP VP Percent: 1 %
Brady Statistic AP VS Percent: 22 %
Brady Statistic AS VP Percent: 1 %
Brady Statistic AS VS Percent: 74 %
Brady Statistic RA Percent Paced: 21 %
Brady Statistic RV Percent Paced: 1 %
Date Time Interrogation Session: 20230410101543
HighPow Impedance: 82 Ohm
HighPow Impedance: 82 Ohm
Implantable Lead Implant Date: 20191203
Implantable Lead Implant Date: 20191203
Implantable Lead Location: 753859
Implantable Lead Location: 753860
Implantable Pulse Generator Implant Date: 20191203
Lead Channel Impedance Value: 440 Ohm
Lead Channel Impedance Value: 600 Ohm
Lead Channel Pacing Threshold Amplitude: 0.5 V
Lead Channel Pacing Threshold Amplitude: 0.5 V
Lead Channel Pacing Threshold Pulse Width: 0.5 ms
Lead Channel Pacing Threshold Pulse Width: 0.5 ms
Lead Channel Sensing Intrinsic Amplitude: 1.8 mV
Lead Channel Sensing Intrinsic Amplitude: 3.6 mV
Lead Channel Setting Pacing Amplitude: 0.75 V
Lead Channel Setting Pacing Amplitude: 2 V
Lead Channel Setting Pacing Pulse Width: 0.5 ms
Lead Channel Setting Sensing Sensitivity: 0.5 mV
Pulse Gen Serial Number: 9854527

## 2021-10-13 ENCOUNTER — Ambulatory Visit (INDEPENDENT_AMBULATORY_CARE_PROVIDER_SITE_OTHER): Payer: Medicare HMO

## 2021-10-13 DIAGNOSIS — I428 Other cardiomyopathies: Secondary | ICD-10-CM

## 2021-10-13 LAB — CUP PACEART REMOTE DEVICE CHECK
Battery Remaining Longevity: 68 mo
Battery Remaining Percentage: 65 %
Battery Voltage: 2.96 V
Brady Statistic AP VP Percent: 1 %
Brady Statistic AP VS Percent: 21 %
Brady Statistic AS VP Percent: 1 %
Brady Statistic AS VS Percent: 76 %
Brady Statistic RA Percent Paced: 19 %
Brady Statistic RV Percent Paced: 1 %
Date Time Interrogation Session: 20230710084645
HighPow Impedance: 80 Ohm
HighPow Impedance: 80 Ohm
Implantable Lead Implant Date: 20191203
Implantable Lead Implant Date: 20191203
Implantable Lead Location: 753859
Implantable Lead Location: 753860
Implantable Pulse Generator Implant Date: 20191203
Lead Channel Impedance Value: 490 Ohm
Lead Channel Impedance Value: 630 Ohm
Lead Channel Pacing Threshold Amplitude: 0.5 V
Lead Channel Pacing Threshold Amplitude: 0.5 V
Lead Channel Pacing Threshold Pulse Width: 0.5 ms
Lead Channel Pacing Threshold Pulse Width: 0.5 ms
Lead Channel Sensing Intrinsic Amplitude: 1.1 mV
Lead Channel Sensing Intrinsic Amplitude: 4.4 mV
Lead Channel Setting Pacing Amplitude: 0.75 V
Lead Channel Setting Pacing Amplitude: 2 V
Lead Channel Setting Pacing Pulse Width: 0.5 ms
Lead Channel Setting Sensing Sensitivity: 0.5 mV
Pulse Gen Serial Number: 9854527

## 2021-11-06 NOTE — Progress Notes (Signed)
Remote reviewed. Battery status noted.  Leads function stable

## 2021-11-13 NOTE — Progress Notes (Signed)
Remote ICD transmission.   

## 2022-01-12 ENCOUNTER — Ambulatory Visit (INDEPENDENT_AMBULATORY_CARE_PROVIDER_SITE_OTHER): Payer: Medicare HMO

## 2022-01-12 DIAGNOSIS — I428 Other cardiomyopathies: Secondary | ICD-10-CM

## 2022-01-12 DIAGNOSIS — I4729 Other ventricular tachycardia: Secondary | ICD-10-CM

## 2022-01-13 LAB — CUP PACEART REMOTE DEVICE CHECK
Battery Remaining Longevity: 65 mo
Battery Remaining Percentage: 62 %
Battery Voltage: 2.96 V
Brady Statistic AP VP Percent: 1 %
Brady Statistic AP VS Percent: 20 %
Brady Statistic AS VP Percent: 1 %
Brady Statistic AS VS Percent: 77 %
Brady Statistic RA Percent Paced: 18 %
Brady Statistic RV Percent Paced: 1 %
Date Time Interrogation Session: 20231009033817
HighPow Impedance: 86 Ohm
HighPow Impedance: 86 Ohm
Implantable Lead Implant Date: 20191203
Implantable Lead Implant Date: 20191203
Implantable Lead Location: 753859
Implantable Lead Location: 753860
Implantable Pulse Generator Implant Date: 20191203
Lead Channel Impedance Value: 480 Ohm
Lead Channel Impedance Value: 640 Ohm
Lead Channel Pacing Threshold Amplitude: 0.5 V
Lead Channel Pacing Threshold Amplitude: 0.5 V
Lead Channel Pacing Threshold Pulse Width: 0.5 ms
Lead Channel Pacing Threshold Pulse Width: 0.5 ms
Lead Channel Sensing Intrinsic Amplitude: 0.5 mV
Lead Channel Sensing Intrinsic Amplitude: 3.6 mV
Lead Channel Setting Pacing Amplitude: 0.75 V
Lead Channel Setting Pacing Amplitude: 2 V
Lead Channel Setting Pacing Pulse Width: 0.5 ms
Lead Channel Setting Sensing Sensitivity: 0.5 mV
Pulse Gen Serial Number: 9854527

## 2022-01-23 NOTE — Progress Notes (Signed)
Remote ICD transmission.   

## 2022-03-09 ENCOUNTER — Other Ambulatory Visit: Payer: Self-pay

## 2022-03-09 DIAGNOSIS — Z1231 Encounter for screening mammogram for malignant neoplasm of breast: Secondary | ICD-10-CM

## 2022-04-13 ENCOUNTER — Ambulatory Visit (INDEPENDENT_AMBULATORY_CARE_PROVIDER_SITE_OTHER): Payer: Medicare Other

## 2022-04-13 DIAGNOSIS — I428 Other cardiomyopathies: Secondary | ICD-10-CM

## 2022-04-13 DIAGNOSIS — I4729 Other ventricular tachycardia: Secondary | ICD-10-CM

## 2022-04-14 LAB — CUP PACEART REMOTE DEVICE CHECK
Battery Remaining Longevity: 62 mo
Battery Remaining Percentage: 60 %
Battery Voltage: 2.96 V
Brady Statistic AP VP Percent: 1 %
Brady Statistic AP VS Percent: 20 %
Brady Statistic AS VP Percent: 1 %
Brady Statistic AS VS Percent: 77 %
Brady Statistic RA Percent Paced: 18 %
Brady Statistic RV Percent Paced: 1 %
Date Time Interrogation Session: 20240108030659
HighPow Impedance: 87 Ohm
HighPow Impedance: 87 Ohm
Implantable Lead Connection Status: 753985
Implantable Lead Connection Status: 753985
Implantable Lead Implant Date: 20191203
Implantable Lead Implant Date: 20191203
Implantable Lead Location: 753859
Implantable Lead Location: 753860
Implantable Pulse Generator Implant Date: 20191203
Lead Channel Impedance Value: 460 Ohm
Lead Channel Impedance Value: 680 Ohm
Lead Channel Pacing Threshold Amplitude: 0.5 V
Lead Channel Pacing Threshold Amplitude: 0.5 V
Lead Channel Pacing Threshold Pulse Width: 0.5 ms
Lead Channel Pacing Threshold Pulse Width: 0.5 ms
Lead Channel Sensing Intrinsic Amplitude: 1.5 mV
Lead Channel Sensing Intrinsic Amplitude: 4.1 mV
Lead Channel Setting Pacing Amplitude: 0.75 V
Lead Channel Setting Pacing Amplitude: 2 V
Lead Channel Setting Pacing Pulse Width: 0.5 ms
Lead Channel Setting Sensing Sensitivity: 0.5 mV
Pulse Gen Serial Number: 9854527
Zone Setting Status: 755011

## 2022-05-14 NOTE — Progress Notes (Signed)
Remote ICD transmission.   

## 2022-05-27 ENCOUNTER — Ambulatory Visit
Admission: RE | Admit: 2022-05-27 | Discharge: 2022-05-27 | Disposition: A | Discharge status: Home / Self Care | Payer: Medicare Other | Source: Ambulatory Visit | Attending: Internal Medicine | Admitting: Internal Medicine

## 2022-05-27 DIAGNOSIS — Z1231 Encounter for screening mammogram for malignant neoplasm of breast: Secondary | ICD-10-CM | POA: Insufficient documentation

## 2022-07-13 ENCOUNTER — Ambulatory Visit (INDEPENDENT_AMBULATORY_CARE_PROVIDER_SITE_OTHER): Payer: Medicare Other

## 2022-07-13 DIAGNOSIS — I428 Other cardiomyopathies: Secondary | ICD-10-CM | POA: Diagnosis not present

## 2022-07-13 LAB — CUP PACEART REMOTE DEVICE CHECK
Battery Remaining Longevity: 60 mo
Battery Remaining Percentage: 57 %
Battery Voltage: 2.95 V
Brady Statistic AP VP Percent: 1 %
Brady Statistic AP VS Percent: 21 %
Brady Statistic AS VP Percent: 1 %
Brady Statistic AS VS Percent: 76 %
Brady Statistic RA Percent Paced: 19 %
Brady Statistic RV Percent Paced: 1 %
Date Time Interrogation Session: 20240408020015
HighPow Impedance: 86 Ohm
HighPow Impedance: 86 Ohm
Implantable Lead Connection Status: 753985
Implantable Lead Connection Status: 753985
Implantable Lead Implant Date: 20191203
Implantable Lead Implant Date: 20191203
Implantable Lead Location: 753859
Implantable Lead Location: 753860
Implantable Pulse Generator Implant Date: 20191203
Lead Channel Impedance Value: 510 Ohm
Lead Channel Impedance Value: 630 Ohm
Lead Channel Pacing Threshold Amplitude: 0.5 V
Lead Channel Pacing Threshold Amplitude: 0.5 V
Lead Channel Pacing Threshold Pulse Width: 0.5 ms
Lead Channel Pacing Threshold Pulse Width: 0.5 ms
Lead Channel Sensing Intrinsic Amplitude: 1 mV
Lead Channel Sensing Intrinsic Amplitude: 4.8 mV
Lead Channel Setting Pacing Amplitude: 0.75 V
Lead Channel Setting Pacing Amplitude: 2 V
Lead Channel Setting Pacing Pulse Width: 0.5 ms
Lead Channel Setting Sensing Sensitivity: 0.5 mV
Pulse Gen Serial Number: 9854527
Zone Setting Status: 755011

## 2022-08-17 ENCOUNTER — Other Ambulatory Visit: Payer: Self-pay

## 2022-08-17 ENCOUNTER — Emergency Department
Admission: EM | Admit: 2022-08-17 | Discharge: 2022-08-17 | Disposition: A | Discharge status: Home / Self Care | Payer: Medicare Other | Attending: Emergency Medicine | Admitting: Emergency Medicine

## 2022-08-17 DIAGNOSIS — B029 Zoster without complications: Secondary | ICD-10-CM | POA: Diagnosis not present

## 2022-08-17 DIAGNOSIS — Z85828 Personal history of other malignant neoplasm of skin: Secondary | ICD-10-CM | POA: Insufficient documentation

## 2022-08-17 DIAGNOSIS — I10 Essential (primary) hypertension: Secondary | ICD-10-CM | POA: Insufficient documentation

## 2022-08-17 DIAGNOSIS — I2510 Atherosclerotic heart disease of native coronary artery without angina pectoris: Secondary | ICD-10-CM | POA: Diagnosis not present

## 2022-08-17 DIAGNOSIS — R220 Localized swelling, mass and lump, head: Secondary | ICD-10-CM | POA: Diagnosis present

## 2022-08-17 LAB — CBC WITH DIFFERENTIAL/PLATELET
Abs Immature Granulocytes: 0.02 10*3/uL (ref 0.00–0.07)
Basophils Absolute: 0.1 10*3/uL (ref 0.0–0.1)
Basophils Relative: 1 %
Eosinophils Absolute: 0.1 10*3/uL (ref 0.0–0.5)
Eosinophils Relative: 1 %
HCT: 46.5 % — ABNORMAL HIGH (ref 36.0–46.0)
Hemoglobin: 15 g/dL (ref 12.0–15.0)
Immature Granulocytes: 0 %
Lymphocytes Relative: 27 %
Lymphs Abs: 2.2 10*3/uL (ref 0.7–4.0)
MCH: 30.9 pg (ref 26.0–34.0)
MCHC: 32.3 g/dL (ref 30.0–36.0)
MCV: 95.7 fL (ref 80.0–100.0)
Monocytes Absolute: 0.8 10*3/uL (ref 0.1–1.0)
Monocytes Relative: 10 %
Neutro Abs: 4.8 10*3/uL (ref 1.7–7.7)
Neutrophils Relative %: 61 %
Platelets: 217 10*3/uL (ref 150–400)
RBC: 4.86 MIL/uL (ref 3.87–5.11)
RDW: 12.4 % (ref 11.5–15.5)
WBC: 8 10*3/uL (ref 4.0–10.5)
nRBC: 0 % (ref 0.0–0.2)

## 2022-08-17 LAB — COMPREHENSIVE METABOLIC PANEL
ALT: 17 U/L (ref 0–44)
AST: 27 U/L (ref 15–41)
Albumin: 4.1 g/dL (ref 3.5–5.0)
Alkaline Phosphatase: 51 U/L (ref 38–126)
Anion gap: 8 (ref 5–15)
BUN: 18 mg/dL (ref 8–23)
CO2: 27 mmol/L (ref 22–32)
Calcium: 10.6 mg/dL — ABNORMAL HIGH (ref 8.9–10.3)
Chloride: 98 mmol/L (ref 98–111)
Creatinine, Ser: 0.78 mg/dL (ref 0.44–1.00)
GFR, Estimated: >60 mL/min (ref >60)
Glucose, Bld: 79 mg/dL (ref 70–99)
Potassium: 3.6 mmol/L (ref 3.5–5.1)
Sodium: 133 mmol/L — ABNORMAL LOW (ref 135–145)
Total Bilirubin: 0.7 mg/dL (ref 0.3–1.2)
Total Protein: 7 g/dL (ref 6.5–8.1)

## 2022-08-17 MED ORDER — OXYCODONE-ACETAMINOPHEN 5-325 MG PO TABS
1.0000 | ORAL_TABLET | Freq: Once | ORAL | Status: AC
  Administered 2022-08-17: 1 via ORAL
  Filled 2022-08-17: qty 1

## 2022-08-17 MED ORDER — VALACYCLOVIR HCL 1 G PO TABS: 1000.0000 mg | ORAL_TABLET | Freq: Three times a day (TID) | ORAL | 0 refills | Status: AC

## 2022-08-17 MED ORDER — FLUORESCEIN SODIUM 1 MG OP STRP
1.0000 | ORAL_STRIP | Freq: Once | OPHTHALMIC | Status: AC
  Administered 2022-08-17: 1 via OPHTHALMIC

## 2022-08-17 NOTE — ED Triage Notes (Signed)
Reports seen at [facility] and dx with cellulitis to L face and eye. Pt was prescribed abx and prednisone. Reports compliance with prescribed meds. Pt reports redness and swelling is spreading. Pt ambulatory to triage. Alert and oriented following commands. Breathing unlabored speaking in full sentences.

## 2022-08-17 NOTE — ED Provider Notes (Signed)
[facility] Provider Note    Event Date/Time   First MD Initiated Contact with Patient 08/17/22 [phone]     (approximate)   History   Cellulitis   HPI  Allison Mclean is a 87 y.o. female known history of coronary disease, heart failure who presents with facial swelling and redness.  About a week ago patient noticed pain on and above her left lip.  She then noticed a lesion in that area and then redness spreading up to her cheek.  She saw her primary doctor and was told she had cellulitis was started on doxycycline Polytrim eyedrops and prednisone.  Despite that she has had ongoing pain.  She has pain behind the left eye and some blurred vision on that side.  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 DR model [phone] (serial  Number [id number]) ICD   Coronary artery disease    a.) LHC 02/07/2015: EF 45-50; global HK; normal coronary anatomy; no CAD. b.) LHC 12/21/2017: DCM with EF 29%; 20% p-m RCA, 20% mLAD; no interventions.   Cortical senile cataract    Dilated cardiomyopathy (HCC)    a.) TTE 01/26/2014: EF 50%. b.) TTE 09/16/2015: EF 40%. c.) TTE 05/14/2017: EF 30%. d.) TTE 06/28/2018: EF 30%.   DOE (dyspnea on exertion)    GERD (gastroesophageal reflux disease)    a.) resolved s/p Nissen fundoplication   HFrEF (heart failure with reduced ejection fraction) (HCC)    a.) TTE 01/23/2014: EF 50%; global HK, mild LA dilation, mild LVH; triv MR, mild AR/TR/PR. b.) TTE 09/16/2015: EF 40%; mild LVH; mild LA dilation; global HK; mild panvalvular regurgitation. c.) TTE 05/14/2017: EF 30%; mod global HK, mild LA dilation, G1DD, triv AR/MR/PR, mild MR. d.) TTE 06/28/2018: EF 30%; mod HK; triv AR/PR, mild MR/TR; G1DD.   History of hiatal hernia    a.) s/p Nissen fundoplication   History of kidney stones    History  of lumbar fusion (12/19/2013) (posterior lumbar fusion from L2-L5) 07/02/2015   Hyperlipidemia    Hypertension    IBS (irritable bowel syndrome)    Jackhammer esophagus    NSVT (nonsustained ventricular tachycardia) (HCC)    Osteoarthritis    Osteopenia, senile 07/02/2015   Skin cancer    SSS (sick sinus syndrome) (HCC)    a.) s/p AICD placement   Thoracic central spinal stenosis 07/02/2015   Thoracic facet syndrome (Left) 07/02/2015   Thoracic foraminal stenosis (Moderate to severe at: Right T5; Bilateral T9; Left T11) 07/02/2015   Thoracic spondylosis with radiculopathy (Left) 07/09/2015   Wears dentures    partial lower    Patient Active Problem List   Diagnosis Date Noted   Status post total hip replacement, right 06/20/2021   Chronic systolic dysfunction of left ventricle 03/08/2018   Bilateral carotid artery stenosis 09/23/2017   Weakness of both lower extremities 09/08/2017   Kyphoscoliosis and scoliosis 06/02/2016   Obstruction of duodenum    Abnormal findings-gastrointestinal tract    Stricture, duodenum    Musculoskeletal pain 08/05/2015   Neuropathic pain 08/05/2015   Hypomagnesemia 07/10/2015   Thoracic spondylosis with radiculopathy (Left) 07/09/2015   Chronic pain 07/02/2015   Long term current use of opiate analgesic 07/02/2015   Long term prescription opiate use 07/02/2015   Opiate use (10 MME/day) 07/02/2015   Encounter for therapeutic drug  level monitoring 07/02/2015   Encounter for pain management planning 07/02/2015   Osteopenia, senile 07/02/2015   Abnormal MRI, thoracic spine 07/02/2015   Thoracic central spinal stenosis (T7-8, T8-9, T9-10, T10-11, T11-12, and T12-L1) 07/02/2015   Thoracic foraminal stenosis (Moderate to severe at: Right T5; Bilateral T9; Left T11) 07/02/2015   Chronic thoracic radicular pain (T5/T6) (Left) 07/02/2015   History of lumbar fusion (12/19/2013) (posterior lumbar fusion from L2-L5) 07/02/2015   Failed back surgical  syndrome 07/02/2015   Thoracic facet syndrome (Left) 07/02/2015   History of fusion of cervical spine 07/02/2015   Postoperative back pain 07/02/2015   Nerve root pain 07/02/2015   Spinal stenosis of thoracic region 07/02/2015   Status post lumbar spine operation 07/02/2015   Atypical chest pain 02/05/2015   Cough 11/26/2014   Acid reflux 11/22/2014   Adaptive colitis 11/22/2014   Essential (primary) hypertension 12/23/2013   Acquired spondylolisthesis 12/19/2013   HLD (hyperlipidemia) 11/07/2013   Supraventricular tachycardia (HCC) 11/07/2013   BP (high blood pressure) 11/07/2013     Physical Exam  Triage Vital Signs: ED Triage Vitals  Enc Vitals Group     BP 08/17/22 0654 (!) 135/59     Pulse Rate 08/17/22 0654 71     Resp 08/17/22 0654 18     Temp 08/17/22 0654 97.8 F (36.6 C)     Temp Source 08/17/22 0654 Oral     SpO2 08/17/22 0654 95 %     Weight 08/17/22 0657 177 lb (80.3 kg)     Height 08/17/22 0657 5\' 1"  (1.549 m)     Head Circumference --      Peak Flow --      Pain Score 08/17/22 0657 8     Pain Loc --      Pain Edu? --      Excl. in GC? --     Most recent vital signs: Vitals:   08/17/22 0654  BP: (!) 135/59  Pulse: 71  Resp: 18  Temp: 97.8 F (36.6 C)  SpO2: 95%     General: Awake, no distress.  CV:  Good peripheral perfusion.  Resp:  Normal effort.  Abd:  No distention.  Neuro:             Awake, Alert, Oriented x 3  Other:  There is a erythematous rash in the V2 distribution that does not cross the midline with a single vesicle just distal to the left nasolabial fold No dendritic lesions or uptake on fluorescein exam No conjunctival injection pupil reactive VA 20/50 BL without her glasses  ED Results / Procedures / Treatments  Labs (all labs ordered are listed, but only abnormal results are displayed) Labs Reviewed  CBC WITH DIFFERENTIAL/PLATELET - Abnormal; Notable for the following components:      Result Value   HCT 46.5 (*)     All other components within normal limits  COMPREHENSIVE METABOLIC PANEL - Abnormal; Notable for the following components:   Sodium 133 (*)    Calcium 10.6 (*)    All other components within normal limits     EKG     RADIOLOGY    PROCEDURES:  Critical Care performed: No  Procedures   MEDICATIONS ORDERED IN ED: Medications  fluorescein ophthalmic strip 1 strip (1 strip Left Eye Given by Other 08/17/22 0814)  oxyCODONE-acetaminophen (PERCOCET/ROXICET) 5-325 MG per tablet 1 tablet (1 tablet Oral Given 08/17/22 0848)     IMPRESSION / MDM / ASSESSMENT AND PLAN / ED COURSE  I reviewed the triage vital signs and the nursing notes.                              Patient's presentation is most consistent with acute complicated illness / injury requiring diagnostic workup.  Differential diagnosis includes, but is not limited to, zoster, herpes ophthalmicus, cellulitis, erysipelas  Patient is a 87 year old female presents with unilateral facial pain redness that started about a week ago.  She is currently being treated as cellulitis with prednisone and doxycycline and Polytrim.  She is presenting today because of ongoing pain especially pain in her left eye and behind the eye with some associated blurred vision.  On exam she has a unilateral erythematous rash in the V2 distribution does not cross the midline.  It is mainly just erythema but she does have 1 vesicular lesion distal to the left nasolabial fold.  Presentation is consistent with zoster.  Given the distribution and involvement of her nose and concern for possible eye involvement.  I did fluorescein the eye there is no dendrites or any uptake which is reassuring.  I did stressed the importance of early follow-up with ophthalmology for repeat exam.  She is to call her optometrist today.  Will start on Valtrex.       FINAL CLINICAL IMPRESSION(S) / ED DIAGNOSES   Final diagnoses:  Herpes zoster without complication     Rx  / DC Orders   ED Discharge Orders          Ordered    valACYclovir (VALTREX) 1000 MG tablet  3 times daily        08/17/22 0818             Note:  This document was prepared using Dragon voice recognition software and may include unintentional dictation errors.   Georga Hacking, MD 08/17/22 0900

## 2022-08-17 NOTE — Progress Notes (Signed)
Remote ICD transmission.   

## 2022-08-17 NOTE — Discharge Instructions (Addendum)
You have shingles.  Please take the antiviral 3 times a day for the next 7 days.  Very important you follow-up with your ophthalmologist or optometrist.  If you are not able to see them you can follow-up with the ophthalmologist above.  Please return to the ER or call your eye doctor for any vision change.  If your symptoms or not improving please follow-up with your primary care doctor.

## 2022-08-17 NOTE — ED Notes (Signed)
Right Eye 20/50 Left Eye 20/50 Both Eyes 20/50

## 2022-10-12 ENCOUNTER — Ambulatory Visit (INDEPENDENT_AMBULATORY_CARE_PROVIDER_SITE_OTHER): Payer: Medicare Other

## 2022-10-12 DIAGNOSIS — I428 Other cardiomyopathies: Secondary | ICD-10-CM | POA: Diagnosis not present

## 2022-10-12 LAB — CUP PACEART REMOTE DEVICE CHECK
Battery Remaining Longevity: 58 mo
Battery Remaining Percentage: 56 %
Battery Voltage: 2.95 V
Brady Statistic AP VP Percent: 1 %
Brady Statistic AP VS Percent: 21 %
Brady Statistic AS VP Percent: 1 %
Brady Statistic AS VS Percent: 75 %
Brady Statistic RA Percent Paced: 19 %
Brady Statistic RV Percent Paced: 1 %
Date Time Interrogation Session: 20240708091118
HighPow Impedance: 86 Ohm
HighPow Impedance: 86 Ohm
Implantable Lead Connection Status: 753985
Implantable Lead Connection Status: 753985
Implantable Lead Implant Date: 20191203
Implantable Lead Implant Date: 20191203
Implantable Lead Location: 753859
Implantable Lead Location: 753860
Implantable Pulse Generator Implant Date: 20191203
Lead Channel Impedance Value: 460 Ohm
Lead Channel Impedance Value: 680 Ohm
Lead Channel Pacing Threshold Amplitude: 0.5 V
Lead Channel Pacing Threshold Amplitude: 0.5 V
Lead Channel Pacing Threshold Pulse Width: 0.5 ms
Lead Channel Pacing Threshold Pulse Width: 0.5 ms
Lead Channel Sensing Intrinsic Amplitude: 0.6 mV
Lead Channel Sensing Intrinsic Amplitude: 4.9 mV
Lead Channel Setting Pacing Amplitude: 0.75 V
Lead Channel Setting Pacing Amplitude: 2 V
Lead Channel Setting Pacing Pulse Width: 0.5 ms
Lead Channel Setting Sensing Sensitivity: 0.5 mV
Pulse Gen Serial Number: 9854527
Zone Setting Status: 755011

## 2022-10-27 NOTE — Progress Notes (Signed)
Remote ICD transmission.   

## 2022-12-08 ENCOUNTER — Other Ambulatory Visit
Admission: RE | Admit: 2022-12-08 | Discharge: 2022-12-08 | Disposition: A | Discharge status: Home / Self Care | Payer: Medicare Other | Source: Ambulatory Visit | Attending: Internal Medicine | Admitting: Internal Medicine

## 2022-12-08 DIAGNOSIS — I429 Cardiomyopathy, unspecified: Secondary | ICD-10-CM | POA: Diagnosis present

## 2022-12-08 LAB — BRAIN NATRIURETIC PEPTIDE: B Natriuretic Peptide: 681.1 pg/mL — ABNORMAL HIGH (ref 0.0–100.0)

## 2023-01-11 ENCOUNTER — Ambulatory Visit (INDEPENDENT_AMBULATORY_CARE_PROVIDER_SITE_OTHER): Payer: Medicare Other

## 2023-01-11 DIAGNOSIS — I428 Other cardiomyopathies: Secondary | ICD-10-CM | POA: Diagnosis not present

## 2023-01-11 DIAGNOSIS — I4729 Other ventricular tachycardia: Secondary | ICD-10-CM

## 2023-01-11 LAB — CUP PACEART REMOTE DEVICE CHECK
Battery Remaining Longevity: 55 mo
Battery Remaining Percentage: 53 %
Battery Voltage: 2.95 V
Brady Statistic AP VP Percent: 1.1 %
Brady Statistic AP VS Percent: 23 %
Brady Statistic AS VP Percent: 1 %
Brady Statistic AS VS Percent: 73 %
Brady Statistic RA Percent Paced: 20 %
Brady Statistic RV Percent Paced: 1.1 %
Date Time Interrogation Session: 20241007034021
HighPow Impedance: 86 Ohm
HighPow Impedance: 86 Ohm
Implantable Lead Connection Status: 753985
Implantable Lead Connection Status: 753985
Implantable Lead Implant Date: 20191203
Implantable Lead Implant Date: 20191203
Implantable Lead Location: 753859
Implantable Lead Location: 753860
Implantable Pulse Generator Implant Date: 20191203
Lead Channel Impedance Value: 460 Ohm
Lead Channel Impedance Value: 560 Ohm
Lead Channel Pacing Threshold Amplitude: 0.5 V
Lead Channel Pacing Threshold Amplitude: 0.5 V
Lead Channel Pacing Threshold Pulse Width: 0.5 ms
Lead Channel Pacing Threshold Pulse Width: 0.5 ms
Lead Channel Sensing Intrinsic Amplitude: 0.9 mV
Lead Channel Sensing Intrinsic Amplitude: 4.8 mV
Lead Channel Setting Pacing Amplitude: 0.75 V
Lead Channel Setting Pacing Amplitude: 2 V
Lead Channel Setting Pacing Pulse Width: 0.5 ms
Lead Channel Setting Sensing Sensitivity: 0.5 mV
Pulse Gen Serial Number: 9854527
Zone Setting Status: 755011

## 2023-01-22 NOTE — Progress Notes (Signed)
Remote ICD transmission.   

## 2023-03-18 ENCOUNTER — Other Ambulatory Visit: Payer: Self-pay | Admitting: Internal Medicine

## 2023-03-18 DIAGNOSIS — Z1231 Encounter for screening mammogram for malignant neoplasm of breast: Secondary | ICD-10-CM

## 2023-04-02 ENCOUNTER — Encounter: Payer: Self-pay | Admitting: Cardiology

## 2023-04-02 NOTE — Telephone Encounter (Signed)
Error

## 2023-06-01 ENCOUNTER — Ambulatory Visit
Admission: RE | Admit: 2023-06-01 | Discharge: 2023-06-01 | Disposition: A | Discharge status: Home / Self Care | Payer: Medicare Other | Source: Ambulatory Visit | Attending: Internal Medicine | Admitting: Internal Medicine

## 2023-06-01 DIAGNOSIS — Z1231 Encounter for screening mammogram for malignant neoplasm of breast: Secondary | ICD-10-CM | POA: Diagnosis present

## 2024-02-10 NOTE — Progress Notes (Signed)
 No chief complaint on file.   HPI  Allison Mclean is a 88 y.o. here for an acute issue.  She has a PMH of HTN, HLD, cardiomyopathy/EF of 20% CAD/MI, SVT, GERD, allergies, OA, osteoporosis who reports increasing weight gain and swelling in her hands and legs since stopping her fluid pill over a week ago.  She has had hyponatremia.  Does not add salt.  She is followed by cardiology, CHF specialist as well.  No chest pain or shortness of breath.  She has general weakness.  Has had ongoing urinary tract infections, tenderness over the bladder and plans to see urology tomorrow.    ROS  Pertinent items are noted in HPI.  Outpatient Encounter Medications as of 02/10/2024  Medication Sig Dispense Refill  . aspirin  81 MG EC tablet Take 81 mg by mouth every morning       . calcium  citrate-vitamin D3 (CITRACAL+D) 315-200 mg-unit tablet Take 2 tablets by mouth every morning       . calcium  polycarbophiL (FIBERCON) 625 mg tablet Take 1 tablet (625 mg total) by mouth once daily (Patient taking differently: Take 625 mg by mouth as needed) 90 tablet 3  . carboxymethylcellulose (REFRESH PLUS) 0.5 % ophthalmic solution Place 1 drop into both eyes 3 (three) times daily    . carvediloL (COREG) 3.125 MG tablet Take 1 tablet (3.125 mg total) by mouth 2 (two) times daily with meals 60 tablet 11  . empagliflozin (JARDIANCE) 10 mg tablet Take 1 tablet (10 mg total) by mouth once daily 90 tablet 3  . fluticasone  propionate (FLONASE ) 50 mcg/actuation nasal spray SPRAY 2 SPRAYS INTO EACH NOSTRIL EVERY DAY 48 g 1  . nystatin  (MYCOSTATIN ) 100,000 unit/gram powder APPLY TOPICALLY TWICE A DAY 60 g 5  . omeprazole (PRILOSEC) 40 MG DR capsule Take 1 capsule (40 mg total) by mouth once daily 90 capsule 3  . pravastatin  (PRAVACHOL ) 80 MG tablet TAKE 1 TABLET BY MOUTH EVERYDAY AT BEDTIME 90 tablet 3  . sacubitriL-valsartan  (ENTRESTO) 24-26 mg tablet TAKE 1 TABLET BY MOUTH EVERY 12 HOURS 180 tablet 3  . spironolactone  (ALDACTONE) 25 MG tablet Take 1 tablet (25 mg total) by mouth once daily 30 tablet 11   No facility-administered encounter medications on file as of 02/10/2024.    Allergies as of 02/10/2024 - Reviewed 01/20/2024  Allergen Reaction Noted  . Atorvastatin Other (See Comments) 04/24/2015  . Crestor [rosuvastatin] Other (See Comments)     Past Medical History:  Diagnosis Date  . Allergic state    Environmental  . Arrhythmia   . Arthritis   . Cataract cortical, senile   . Chronic cough   . GERD (gastroesophageal reflux disease)    resolved with Nissen procedure  . History of blood transfusion   . History of nephrolithiasis   . History of shingles   . Hx of myocardial infarction   . Hyperlipidemia   . Hypertension   . IBS (irritable bowel syndrome)   . Obesity   . Osteoporosis, post-menopausal   . Presence of automatic implantable cardioverter-defibrillator   . SVT (supraventricular tachycardia) (HHS-HCC)     Past Surgical History:  Procedure Laterality Date  . HEMORRHOIDECTOMY EXTERNAL  09/28/2001   Dr. Unknown Sharps  . EGD  02/01/2002  . POSTERIOR LAMINECTOMY / DECOMPRESSION LUMBAR SPINE  09/11/2003   L4-5 lumbar decompression and fusion  . LAPAROSCOPIC ESOPHAGOGASTRIC FUNDOPLASTY (NISSEN PROCEDURE)  2007  . ANTERIOR FUSION CERVICAL SPINE  07/16/2008   Diskectomy anterior complete  with removal of posterior osteophytes at C6-7. Insertion of interbody device, BAK-C 10 mm at C5-6. Insertion of interbody device, BAK-C, 12 mm at C6-7.  SABRA COLONOSCOPY  10/27/2011   Adenomatous Polyp: CBF 10/2016; Recall Ltr mailed 09/18/2016 (dw)  . ESOPHAGOGASTRODOUDENOSCOPY W/BIOPSY N/A 03/13/2016   Procedure: ESOPHAGOGASTRODUODENOSCOPY, FLEXIBLE, TRANSORAL; WITH BIOPSY, SINGLE OR MULTIPLE;  Surgeon: Asberry Jenkins Coffee, MD;  Location: [facility];  Service: Gastroenterology;  Laterality: N/A;  . ENDOSCOPIC ULTRASOUND N/A 03/13/2016   Procedure: ENDOSCOPIC ULTRASOUND;  Surgeon:  Asberry Jenkins Coffee, MD;  Location: [facility];  Service: Gastroenterology;  Laterality: N/A;  . BLEPHAROPLASTY UPPER EYELID Bilateral 06/18/2020   Procedure: BILATERAL UPPER EYELID BLEPHAROPLASTY;  Surgeon: Ashley Greig Jansky, MD;  Location: [facility];  Service: Ophthalmology;  Laterality: Bilateral;  . REPLACEMENT TOTAL HIP W/  RESURFACING IMPLANTS Right 06/20/2021   Kathlynn  . ANTERIOR FUSION CERVICAL SPINE     Diskectomy anterior with removal of posterior osteophytes C5-6.  SABRA APPENDECTOMY    . CARPAL TUNNEL RELEASE    . CHOLECYSTECTOMY    . HYSTERECTOMY    . OOPHORECTOMY    . TONSILLECTOMY      Vitals:   02/10/24 1250  BP: 120/70  Pulse: 67    Physical Exam  General. Well appearing; NAD; VS reviewed     HEENT: Sclera and conjunctiva clear; EOMI,  Lungs. Respirations unlabored; clear to auscultation bilaterally. Cardiovascular. Heart regular rate and rhythm without murmurs, gallops, or rubs. Extremities: Trace edema to the hands, lower extremities. Skin. Normal color and turgor Neurologic. Alert and oriented x3   Assessment and Plan 1. Cardiomyopathy, idiopathic (CMS/HHS-HCC) EF 20s to 30s.  Spironolactone was held because of a decline in her sodium.  She has been retaining fluid since then and will restart today.  Continue to monitor.  Discussed changing to as needed as fluid status stabilizes.  2. Primary hypertension Controlled.  3. HFrEF (heart failure with reduced ejection fraction) (CMS/HHS-HCC) Weight is up about 5 pounds in 5 months.  4. SOB (shortness of breath) Stable.  5. Hyponatremia Multifactorial.  6. History of UTI Sees urology tomorrow.    I have personally performed this service.  [address] [place], [place]

## 2024-02-11 ENCOUNTER — Ambulatory Visit (INDEPENDENT_AMBULATORY_CARE_PROVIDER_SITE_OTHER): Admitting: Physician Assistant

## 2024-02-11 VITALS — BP 115/64 | HR 71 | Ht 61.0 in | Wt 170.0 lb

## 2024-02-11 DIAGNOSIS — R399 Unspecified symptoms and signs involving the genitourinary system: Secondary | ICD-10-CM | POA: Diagnosis not present

## 2024-02-11 DIAGNOSIS — R1024 Suprapubic pain: Secondary | ICD-10-CM | POA: Diagnosis not present

## 2024-02-11 DIAGNOSIS — R3915 Urgency of urination: Secondary | ICD-10-CM | POA: Diagnosis not present

## 2024-02-11 LAB — URINALYSIS, COMPLETE
Bilirubin, UA: NEGATIVE
Ketones, UA: NEGATIVE
Leukocytes,UA: NEGATIVE
Nitrite, UA: NEGATIVE
Protein,UA: NEGATIVE
RBC, UA: NEGATIVE
Specific Gravity, UA: <1.005 — ABNORMAL LOW (ref 1.005–1.030)
Urobilinogen, Ur: 0.2 mg/dL (ref 0.2–1.0)
pH, UA: 6.5 (ref 5.0–7.5)

## 2024-02-11 LAB — BLADDER SCAN AMB NON-IMAGING: Scan Result: 93

## 2024-02-11 LAB — MICROSCOPIC EXAMINATION
Bacteria, UA: NONE SEEN
RBC, Urine: NONE SEEN /HPF (ref 0–2)

## 2024-02-11 MED ORDER — MIRABEGRON ER 50 MG PO TB24: 50.0000 mg | ORAL_TABLET | Freq: Every day | ORAL | 2 refills | Status: AC

## 2024-02-11 NOTE — Patient Instructions (Signed)
 Ultrasound scheduling: [phone]

## 2024-02-11 NOTE — Progress Notes (Signed)
 02/11/2024 9:10 AM   Allison Mclean 1935/11/10 [id number]  CC: Chief Complaint  Patient presents with   Establish Care   Urinary Incontinence   HPI: Allison Mclean is a 88 y.o. female with PMH OAB wet with mixed incontinence previously on Gemtesa , nephrolithiasis, CAD, cardiomyopathy, heart failure on Lasix  and Jardiance who presents today as a new patient for evaluation of UTI.  She was previously managed in our clinic by Dr. Penne and Clotilda with her history of mixed incontinence.  Today she reports she does not think she is taking Gemtesa  anymore.  She had a bladder infection over the summer with symptoms including dysuria and chills.  Her dysuria has largely resolved, but she is having persistent intermittent throbbing suprapubic discomfort.  It happens a few times a day and is different in sensation to her urinary urgency.  She has a remote history of stones and denies gross hematuria.  She has been on Jardiance for about 2 years.  She still has occasional pain with voiding.  Urine culture history as follows: 12/28/2023: Insignificant growth 11/22/2023: Pansensitive E. Coli 10/25/2023: Mixed urogenital flora  In-office UA today positive for 2+ glucose; urine microscopy pan negative. PVR 93mL.  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 DR model [phone] (serial  Number [id number]) ICD   Coronary artery disease    a.) LHC 02/07/2015: EF 45-50; global HK; normal coronary anatomy; no CAD. b.) LHC 12/21/2017: DCM with EF 29%; 20% p-m RCA, 20% mLAD; no interventions.   Cortical senile cataract    Dilated cardiomyopathy (HCC)    a.) TTE 01/26/2014: EF 50%. b.) TTE 09/16/2015: EF 40%. c.) TTE 05/14/2017: EF 30%. d.) TTE 06/28/2018: EF 30%.   DOE (dyspnea on exertion)    GERD (gastroesophageal reflux disease)    a.) resolved s/p Nissen  fundoplication   HFrEF (heart failure with reduced ejection fraction) (HCC)    a.) TTE 01/23/2014: EF 50%; global HK, mild LA dilation, mild LVH; triv MR, mild AR/TR/PR. b.) TTE 09/16/2015: EF 40%; mild LVH; mild LA dilation; global HK; mild panvalvular regurgitation. c.) TTE 05/14/2017: EF 30%; mod global HK, mild LA dilation, G1DD, triv AR/MR/PR, mild MR. d.) TTE 06/28/2018: EF 30%; mod HK; triv AR/PR, mild MR/TR; G1DD.   History of hiatal hernia    a.) s/p Nissen fundoplication   History of kidney stones    History of lumbar fusion (12/19/2013) (posterior lumbar fusion from L2-L5) 07/02/2015   Hyperlipidemia    Hypertension    IBS (irritable bowel syndrome)    Jackhammer esophagus    NSVT (nonsustained ventricular tachycardia) (HCC)    Osteoarthritis    Osteopenia, senile 07/02/2015   Skin cancer    SSS (sick sinus syndrome) (HCC)    a.) s/p AICD placement   Thoracic central spinal stenosis 07/02/2015   Thoracic facet syndrome (Left) 07/02/2015   Thoracic foraminal stenosis (Moderate to severe at: Right T5; Bilateral T9; Left T11) 07/02/2015   Thoracic spondylosis with radiculopathy (Left) 07/09/2015   Wears dentures    partial lower    Surgical History: Past Surgical History:  Procedure Laterality Date   ABDOMINAL EXPOSURE N/A 06/02/2016   Procedure: ABDOMINAL EXPOSURE;  Surgeon: Lonni GORMAN Blade, MD;  Location: [facility] OR;  Service: Vascular;  Laterality: N/A;   ABDOMINAL HYSTERECTOMY     ANTERIOR LAT LUMBAR FUSION N/A 06/02/2016   Procedure: L1-2 Left  Anterior lateral lumbar interbody fusion;  Surgeon: Fairy Levels, MD;  Location: [facility] OR;  Service: Neurosurgery;  Laterality: N/A;  L1-2 Anterior lateral lumbar interbody fusion   ANTERIOR LUMBAR FUSION N/A 06/02/2016   Procedure: Lumbar five -sacral one Anterior lumbar interbody fusion with Dr. Lonni Blade; Lumbar one-two Left Anterior lateral lumbar interbody fusion;  Surgeon: Fairy Levels, MD;  Location: [facility] OR;   Service: Neurosurgery;  Laterality: N/A;   APPENDECTOMY     APPLICATION OF INTRAOPERATIVE CT SCAN N/A 06/05/2016   Procedure: APPLICATION OF INTRAOPERATIVE CT SCAN;  Surgeon: Fairy Levels, MD;  Location: [facility] OR;  Service: Neurosurgery;  Laterality: N/A;   BACK SURGERY     2005, 2006, 2015   BLEPHAROPLASTY Bilateral    CARDIAC CATHETERIZATION N/A 02/07/2015   Procedure: Left Heart Cath and Coronary Angiography;  Surgeon: Vinie DELENA Jude, MD;  Location: [facility] INVASIVE CV LAB;  Service: Cardiovascular;  Laterality: N/A;   CHOLECYSTECTOMY     ESOPHAGOGASTRODUODENOSCOPY (EGD) WITH PROPOFOL  N/A 02/03/2016   Procedure: ESOPHAGOGASTRODUODENOSCOPY (EGD) WITH PROPOFOL ;  Surgeon: Rogelia Copping, MD;  Location[facility];  Service: Endoscopy;  Laterality: N/A;   ESOPHAGOGASTRODUODENOSCOPY (EGD) WITH PROPOFOL  N/A 03/02/2016   Procedure: ESOPHAGOGASTRODUODENOSCOPY (EGD) WITH PROPOFOL ;  Surgeon: Rogelia Copping, MD;  Location: [facility];  Service: Endoscopy;  Laterality: N/A;   ICD IMPLANT N/A 03/08/2018   [employer] Assura DR model [phone] (serial  Number [id number]) ICD by Dr Kelsie for treatment of syncope and reduced EF.  R sided implanted due to persistent L SVC   LEFT HEART CATH AND CORONARY ANGIOGRAPHY Left 12/21/2017   Procedure: LEFT HEART CATH AND CORONARY ANGIOGRAPHY;  Surgeon: Ammon Blunt, MD;  Location: [facility] INVASIVE CV LAB;  Service: Cardiovascular;  Laterality: Left;   LEFT HEART CATH AND CORONARY ANGIOGRAPHY Left 02/11/2011   Procedure: LEFT HEART CATH AND CORONARY ANGIOGRAPHY; Location: [facility]; Surgeon: Vinie Jude, MD   NISSEN FUNDOPLICATION     POSTERIOR LUMBAR FUSION 4 LEVEL N/A 06/05/2016   Procedure: Thoracic six to pelvis fixation with Smith-Peterson Thoracic osteotomies at Thoracic ten-Thoracic Twelve with AIRO, Removal hardware lumbar two to lumbar five;  Surgeon: Fairy Levels, MD;  Location: [facility] OR;  Service: Neurosurgery;  Laterality: N/A;   SPINAL  GROWTH RODS Bilateral 2018   SYMPATHECTOMY     ? improve blood flow to legs   TONSILECTOMY, ADENOIDECTOMY, BILATERAL MYRINGOTOMY AND TUBES     TOTAL HIP ARTHROPLASTY Right 06/20/2021   Procedure: TOTAL HIP ARTHROPLASTY ANTERIOR APPROACH;  Surgeon: Kathlynn Sharper, MD;  Location: [facility] ORS;  Service: Orthopedics;  Laterality: Right;    Home Medications:  Allergies as of 02/11/2024       Reactions   Atorvastatin Other (See Comments)   Chest pain     Rosuvastatin Other (See Comments)   Causes chest pain          Medication List        Accurate as of February 11, 2024  9:10 AM. If you have any questions, ask your nurse or doctor.          amLODipine  2.5 MG tablet Commonly known as: NORVASC  Take 2.5 mg by mouth every morning.   aspirin  EC 81 MG tablet Take 81 mg by mouth at bedtime.   CALCIUM  CITRATE-VITAMIN D  PO Take 1 tablet by mouth 2 (two) times daily.   carboxymethylcellulose 0.5 % Soln Commonly known as: REFRESH PLUS Place 1 drop into both eyes 3 (three) times daily as needed (dry eyes).  enoxaparin  40 MG/0.4ML injection Commonly known as: LOVENOX  Inject 0.4 mLs (40 mg total) into the skin daily for 14 days.   fluticasone  50 MCG/ACT nasal spray Commonly known as: FLONASE  Place 2 sprays into both nostrils at bedtime as needed for allergies.   furosemide  20 MG tablet Commonly known as: LASIX  Take 20 mg by mouth daily as needed for fluid.   Gemtesa  75 MG Tabs Generic drug: Vibegron  Take 75 mg by mouth daily.   HYDROcodone -acetaminophen  5-325 MG tablet Commonly known as: NORCO/VICODIN Take 1-2 tablets by mouth every 4 (four) hours as needed for moderate pain (pain score 4-6).   multivitamin with minerals Tabs tablet Take 1 tablet by mouth daily.   nystatin  powder Commonly known as: MYCOSTATIN /NYSTOP  Apply 1 g topically daily. Under bil breasts   potassium chloride  SA 20 MEQ tablet Commonly known as: KLOR-CON  M Take 20 mEq by mouth every evening.    pravastatin  80 MG tablet Commonly known as: PRAVACHOL  Take 80 mg by mouth at bedtime.   Systane Nighttime Oint Place 1 application into both eyes at bedtime as needed (dry eyes).   traMADol  50 MG tablet Commonly known as: ULTRAM  Take 1 tablet (50 mg total) by mouth every 6 (six) hours.   valsartan -hydrochlorothiazide  160-12.5 MG tablet Commonly known as: DIOVAN -HCT Take 1 tablet by mouth every evening.        Allergies:  Allergies  Allergen Reactions   Atorvastatin Other (See Comments)    Chest pain     Rosuvastatin Other (See Comments)    Causes chest pain      Family History: Family History  Problem Relation Age of Onset   Diabetes Mother    Arthritis Mother    Heart disease Mother    Hypertension Mother    Diabetes Father    Asthma Father    Heart disease Father    Hypertension Father    Stroke Father    Breast cancer Neg Hx     Social History:   reports that she has never smoked. She has never used smokeless tobacco. She reports that she does not drink alcohol  and does not use drugs.  Physical Exam: BP 115/64   Pulse 71   Ht 5' 1 (1.549 m)   Wt 170 lb (77.1 kg)   BMI 32.12 kg/m   Constitutional:  Alert and oriented, no acute distress, nontoxic appearing HEENT: , AT Cardiovascular: No clubbing, cyanosis, or edema Respiratory: Normal respiratory effort, no increased work of breathing Skin: No rashes, bruises or suspicious lesions Neurologic: Grossly intact, no focal deficits, moving all 4 extremities Psychiatric: Normal mood and affect  Laboratory Data: Results for orders placed or performed in visit on 02/11/24  BLADDER SCAN AMB NON-IMAGING   Collection Time: 02/11/24  9:50 AM  Result Value Ref Range   Scan Result 93 ml    Assessment & Plan:   1. Suprapubic pain (Primary) UA today is bland, low suspicion for recurrent or persistent UTI.  Will obtain renal ultrasound to look for any hydronephrosis or structural bladder abnormalities that  could account for her pain.   - Urinalysis, Complete - BLADDER SCAN AMB NON-IMAGING - US  RENAL; Future  2. Urinary urgency I am putting her back on a beta 3 agonist to help with her urgency and we will see her back in 6 weeks for symptom recheck. - mirabegron ER (MYRBETRIQ) 50 MG TB24 tablet; Take 1 tablet (50 mg total) by mouth daily.  Dispense: 30 tablet; Refill: 2   Return  in about 6 weeks (around 03/24/2024) for Symptom recheck with PVR and RUS results.  Lucie Hones, PA-C  [facility] [place] [address], [address] [place], [place] [address] [phone]

## 2024-02-17 ENCOUNTER — Ambulatory Visit
Admission: RE | Admit: 2024-02-17 | Discharge: 2024-02-17 | Disposition: A | Discharge status: Home / Self Care | Source: Ambulatory Visit | Attending: Physician Assistant | Admitting: Physician Assistant

## 2024-02-17 DIAGNOSIS — R1024 Suprapubic pain: Secondary | ICD-10-CM | POA: Diagnosis present

## 2024-03-23 NOTE — Progress Notes (Signed)
 Allison Mclean is a  88 y.o. female who presents for  CHIEF COMPLAINT Chief Complaint  Patient presents with   Follow-up    3 month     Subjective: History of Present Illness  Pt in NAD. CHF followed by Cardiology. Obese with BMI>32. HTN stable on meds. Has HLD on statin, GERD on PPI and prediabetes not on meds. Weight stable. Feels well. LE edema improved. No fever. Denies CP or SOB. No palpitations. No change in bowels.    Past Medical History:  Diagnosis Date   Allergic state    Environmental   Arrhythmia    Arthritis    Cataract cortical, senile    Chronic cough    GERD (gastroesophageal reflux disease)    resolved with Nissen procedure   History of blood transfusion    History of nephrolithiasis    History of shingles    Hx of myocardial infarction    Hyperlipidemia    Hypertension    IBS (irritable bowel syndrome)    Obesity    Osteoporosis, post-menopausal    Presence of automatic implantable cardioverter-defibrillator    SVT (supraventricular tachycardia) (HHS-HCC)    Patient Active Problem List  Diagnosis   Hypertension   SVT (supraventricular tachycardia) (CMS-HCC)   Hyperlipidemia   GERD (gastroesophageal reflux disease)   IBS (irritable bowel syndrome)   Abnormal glucose   Atypical chest pain   Thoracic radiculitis   Cardiomyopathy, idiopathic (CMS/HHS-HCC)   Spinal stenosis of thoracolumbar region   Drug-induced constipation   PVC (premature ventricular contraction)   NSVT (nonsustained ventricular tachycardia) (CMS/HHS-HCC)   S/P cardiac cath   AICD (automatic cardioverter/defibrillator) present   Chronic insomnia   Abnormal MRI, thoracic spine   Radiculopathy, site unspecified   Squamous cell skin cancer, finger, left   Status post lumbar spine operation   Status post total hip replacement, right   Exertional dyspnea   Nonrheumatic aortic valve stenosis   HFrEF (heart failure with reduced  ejection fraction) (CMS/HHS-HCC)    Past Surgical History:  Procedure Laterality Date   HEMORRHOIDECTOMY EXTERNAL  09/28/2001   Dr. Unknown Sharps   EGD  02/01/2002   POSTERIOR LAMINECTOMY / DECOMPRESSION LUMBAR SPINE  09/11/2003   L4-5 lumbar decompression and fusion   LAPAROSCOPIC ESOPHAGOGASTRIC FUNDOPLASTY (NISSEN PROCEDURE)  2007   ANTERIOR FUSION CERVICAL SPINE  07/16/2008   Diskectomy anterior complete with removal of posterior osteophytes at C6-7. Insertion of interbody device, BAK-C 10 mm at C5-6. Insertion of interbody device, BAK-C, 12 mm at C6-7.   COLONOSCOPY  10/27/2011   Adenomatous Polyp: CBF 10/2016; Recall Ltr mailed 09/18/2016 (dw)   ESOPHAGOGASTRODOUDENOSCOPY W/BIOPSY N/A 03/13/2016   Procedure: ESOPHAGOGASTRODUODENOSCOPY, FLEXIBLE, TRANSORAL; WITH BIOPSY, SINGLE OR MULTIPLE;  Surgeon: Asberry Jenkins Coffee, MD;  Location: [facility];  Service: Gastroenterology;  Laterality: N/A;   ENDOSCOPIC ULTRASOUND N/A 03/13/2016   Procedure: ENDOSCOPIC ULTRASOUND;  Surgeon: Asberry Jenkins Coffee, MD;  Location: [facility];  Service: Gastroenterology;  Laterality: N/A;   BLEPHAROPLASTY UPPER EYELID Bilateral 06/18/2020   Procedure: BILATERAL UPPER EYELID BLEPHAROPLASTY;  Surgeon: Ashley Greig Jansky, MD;  Location: [facility];  Service: Ophthalmology;  Laterality: Bilateral;   REPLACEMENT TOTAL HIP W/  RESURFACING IMPLANTS Right 06/20/2021   Menz   ANTERIOR FUSION CERVICAL SPINE     Diskectomy anterior with removal of posterior osteophytes C5-6.   APPENDECTOMY     CARPAL TUNNEL RELEASE     CHOLECYSTECTOMY     HYSTERECTOMY     OOPHORECTOMY  TONSILLECTOMY       Current Outpatient Medications:    aspirin  81 MG EC tablet, Take 81 mg by mouth every morning   , Disp: , Rfl:    calcium  citrate-vitamin D3 (CITRACAL+D) 315-200 mg-unit tablet, Take 2 tablets by mouth every morning   , Disp: , Rfl:    calcium  polycarbophiL (FIBERCON)  625 mg tablet, Take 1 tablet (625 mg total) by mouth once daily (Patient taking differently: Take 625 mg by mouth as needed), Disp: 90 tablet, Rfl: 3   carboxymethylcellulose (REFRESH PLUS) 0.5 % ophthalmic solution, Place 1 drop into both eyes 3 (three) times daily, Disp: , Rfl:    carvediloL (COREG) 3.125 MG tablet, Take 1 tablet (3.125 mg total) by mouth 2 (two) times daily with meals, Disp: 60 tablet, Rfl: 11   empagliflozin (JARDIANCE) 10 mg tablet, Take 1 tablet (10 mg total) by mouth once daily, Disp: 90 tablet, Rfl: 3   fluticasone  propionate (FLONASE ) 50 mcg/actuation nasal spray, SPRAY 2 SPRAYS INTO EACH NOSTRIL EVERY DAY, Disp: 48 g, Rfl: 1   nystatin  (MYCOSTATIN ) 100,000 unit/gram powder, APPLY TOPICALLY TWICE A DAY, Disp: 60 g, Rfl: 5   omeprazole (PRILOSEC) 40 MG DR capsule, Take 1 capsule (40 mg total) by mouth once daily, Disp: 90 capsule, Rfl: 3   pravastatin  (PRAVACHOL ) 80 MG tablet, TAKE 1 TABLET BY MOUTH EVERYDAY AT BEDTIME, Disp: 90 tablet, Rfl: 3   sacubitriL-valsartan  (ENTRESTO) 24-26 mg tablet, TAKE 1 TABLET BY MOUTH EVERY 12 HOURS, Disp: 180 tablet, Rfl: 3   spironolactone (ALDACTONE) 25 MG tablet, Take 1 tablet (25 mg total) by mouth once daily, Disp: 30 tablet, Rfl: 11  Atorvastatin and Crestor [rosuvastatin]  Social History   Socioeconomic History   Marital status: Widowed  Tobacco Use   Smoking status: Never   Smokeless tobacco: Never  Vaping Use   Vaping status: Never Used  Substance and Sexual Activity   Alcohol  use: No   Drug use: Never   Sexual activity: Not Currently  Social History Narrative   Education: HS   Occupation: Retired   Hobbies: na   Marital Status: married   Social Drivers of Corporate Investment Banker Strain: Low Risk  (03/23/2024)   Overall Financial Resource Strain (CARDIA)    Difficulty of Paying Living Expenses: Not hard at all  Food Insecurity: No Food Insecurity (03/23/2024)   Hunger Vital Sign    Worried  About Running Out of Food in the Last Year: Never true    Ran Out of Food in the Last Year: Never true  Transportation Needs: No Transportation Needs (03/23/2024)   PRAPARE - Administrator, Civil Service (Medical): No    Lack of Transportation (Non-Medical): No  Physical Activity: Insufficiently Active (12/21/2017)   Received from [facility]   Exercise Vital Sign    Days of Exercise per Week: 7 days    Minutes of Exercise per Session: 20 min  Stress: No Stress Concern Present (12/21/2017)   Received from [facility] - Occupational Stress Questionnaire    Feeling of Stress : Not at all  Social Connections: Unknown (12/21/2017)   Received from [facility]   Social Connection and Isolation Panel    Frequency of Communication with Friends and Family: More than three times a week    Frequency of Social Gatherings with Friends and Family: Twice a week    Marital Status: Married  Housing Stability: Low Risk  (03/23/2024)  Housing Stability Vital Sign    Unable to Pay for Housing in the Last Year: No    Number of Times Moved in the Last Year: 0    Homeless in the Last Year: No    Family History  Problem Relation Name Age of Onset   Myocardial Infarction (Heart attack) Mother     High blood pressure (Hypertension) Mother     Diabetes type II Mother     Coronary Artery Disease (Blocked arteries around heart) Mother     Myocardial Infarction (Heart attack) Father     High blood pressure (Hypertension) Father     Prostate cancer Father     Diabetes type II Father     Stroke Father     Asthma Father     Cancer Father     Coronary Artery Disease (Blocked arteries around heart) Father     Diabetes type II Sister     Cancer Sister     Coronary Artery Disease (Blocked arteries around heart) Sister     Diabetes Sister     Stroke Sister     Coronary Artery Disease (Blocked arteries around heart) Brother      Anesthesia problems Neg Hx     Malignant hypertension Neg Hx      A comprehensive ROS was negative except for HPI  PE: BP 112/62   Pulse 65   Ht 154.9 cm (5' 1)   Wt 78.7 kg (173 lb 9.6 oz)   SpO2 92%   BMI 32.80 kg/m  General: Alert oriented x3  Eyes: Sclera and conjunctiva clear; pupils equal round and reactive to light and accommodation; extraocular movements intact Nose: Mucosa healthy without drainage or ulceration Oropharynx: No suspicious lesions Neck: No swelling, masses, stiffness, pain, limited movement, carotid pulses normal bilaterally, thyroid normal size, no masses palpated. No bruits heard Lungs: Respirations unlabored; clear to auscultation bilaterally Back: No spinal deformity Cardiovascular: Heart regular rate and rhythm without murmurs, gallops, or rubs Abdomen: Soft; non tender; non distended;  no masses or organomegaly Lymph Nodes: No significant cervical, supraclavicular, or axillary lymphadenopathy noted Musculoskeletal: No active joint inflammation Extremities: Normal, no edema Pulses: Dorsalis pedis palpable and symmetric bilaterally Neurologic: Alert and oriented; speech intact; face symmetrical; moves all extremities well    Appointment on 02/02/2024  Component Date Value Ref Range Status   Glucose 02/02/2024 75  70 - 110 mg/dL Final   Sodium 89/70/7974 133 (L)  136 - 145 mmol/L Final   Potassium 02/02/2024 4.8  3.6 - 5.1 mmol/L Final   Chloride 02/02/2024 96 (L)  97 - 109 mmol/L Final   Carbon Dioxide (CO2) 02/02/2024 33.3 (H)  22.0 - 32.0 mmol/L Final   Urea Nitrogen (BUN) 02/02/2024 15  7 - 25 mg/dL Final   Creatinine 89/70/7974 0.8  0.6 - 1.1 mg/dL Final   Glomerular Filtration Rate (eGFR) 02/02/2024 72  >60 mL/min/1.73sq m Final   Calcium  02/02/2024 11.0 (H)  8.7 - 10.3 mg/dL Final   Albumin 89/70/7974 4.2  3.5 - 4.8 g/dL Final   Phosphorus 89/70/7974 3.0  2.5 - 5.0 mg/dL Final  Appointment on 89/77/7974  Component Date Value  Ref Range Status   Glucose 01/26/2024 84  70 - 110 mg/dL Final   Sodium 89/77/7974 129 (L)  136 - 145 mmol/L Final   Potassium 01/26/2024 4.9  3.6 - 5.1 mmol/L Final   Chloride 01/26/2024 92 (L)  97 - 109 mmol/L Final   Carbon Dioxide (CO2) 01/26/2024 31.2  22.0 -  32.0 mmol/L Final   Urea Nitrogen (BUN) 01/26/2024 21  7 - 25 mg/dL Final   Creatinine 89/77/7974 0.9  0.6 - 1.1 mg/dL Final   Glomerular Filtration Rate (eGFR) 01/26/2024 62  >60 mL/min/1.73sq m Final   Calcium  01/26/2024 11.2 (H)  8.7 - 10.3 mg/dL Final   Albumin 89/77/7974 4.3  3.5 - 4.8 g/dL Final   Phosphorus 89/77/7974 3.9  2.5 - 5.0 mg/dL Final  Appointment on 89/91/7974  Component Date Value Ref Range Status   Glucose 01/12/2024 85  70 - 110 mg/dL Final   Sodium 89/91/7974 130 (L)  136 - 145 mmol/L Final   Potassium 01/12/2024 4.6  3.6 - 5.1 mmol/L Final   Chloride 01/12/2024 94 (L)  97 - 109 mmol/L Final   Carbon Dioxide (CO2) 01/12/2024 31.9  22.0 - 32.0 mmol/L Final   Urea Nitrogen (BUN) 01/12/2024 18  7 - 25 mg/dL Final   Creatinine 89/91/7974 0.9  0.6 - 1.1 mg/dL Final   Glomerular Filtration Rate (eGFR) 01/12/2024 62  >60 mL/min/1.73sq m Final   Calcium  01/12/2024 11.1 (H)  8.7 - 10.3 mg/dL Final   Albumin 89/91/7974 4.2  3.5 - 4.8 g/dL Final   Phosphorus 89/91/7974 3.5  2.5 - 5.0 mg/dL Final  Appointment on 90/76/7974  Component Date Value Ref Range Status   Color 12/28/2023 Light Yellow  Colorless, Straw, Light Yellow, Yellow, Dark Yellow Final   Clarity 12/28/2023 Clear  Clear Final   Specific Gravity 12/28/2023 1.011  1.005 - 1.030 Final   pH, Urine 12/28/2023 5.5  5.0 - 8.0 Final   Protein, Urinalysis 12/28/2023 Negative  Negative mg/dL Final   Glucose, Urinalysis 12/28/2023 4+ (!)  Negative mg/dL Final   Ketones, Urinalysis 12/28/2023 Negative  Negative mg/dL Final   Blood, Urinalysis 12/28/2023 Negative  Negative Final   Nitrite, Urinalysis 12/28/2023 Negative  Negative  Final   Leukocyte Esterase, Urinalysis 12/28/2023 Negative  Negative Final   Bilirubin, Urinalysis 12/28/2023 Negative  Negative Final   Urobilinogen, Urinalysis 12/28/2023 0.2  0.2 - 1.0 mg/dL Final   WBC, UA 90/76/7974 <1  <=5 /hpf Final   Red Blood Cells, Urinalysis 12/28/2023 0  <=3 /hpf Final   Bacteria, Urinalysis 12/28/2023 0-5  0 - 5 /hpf Final   Squamous Epithelial Cells, Urinaly* 12/28/2023 0  /hpf Final   Urine Culture, Routine - [facility] 12/28/2023 Final report   Final   Result 1 - [facility] 12/28/2023 Comment   Final   WBC (White Blood Cell Count) 12/28/2023 6.3  4.1 - 10.2 103/uL Final   RBC (Red Blood Cell Count) 12/28/2023 4.88  4.04 - 5.48 106/uL Final   Hemoglobin 12/28/2023 15.2 (H)  12.0 - 15.0 gm/dL Final   Hematocrit 90/76/7974 46.7  35.0 - 47.0 % Final   MCV (Mean Corpuscular Volume) 12/28/2023 95.7  80.0 - 100.0 fl Final   MCH (Mean Corpuscular Hemoglobin) 12/28/2023 31.1  27.0 - 31.2 pg Final   MCHC (Mean Corpuscular Hemoglobin * 12/28/2023 32.5  32.0 - 36.0 gm/dL Final   Platelet Count 12/28/2023 234  150 - 450 103/uL Final   RDW-CV (Red Cell Distribution Widt* 12/28/2023 12.4  11.6 - 14.8 % Final   MPV (Mean Platelet Volume) 12/28/2023 10.2  9.4 - 12.4 fl Final   Neutrophils 12/28/2023 3.87  1.50 - 7.80 103/uL Final   Lymphocytes 12/28/2023 1.68  1.00 - 3.60 103/uL Final   Monocytes 12/28/2023 0.60  0.00 - 1.50 103/uL Final   Eosinophils 12/28/2023 0.11  0.00 - 0.55  103/uL Final   Basophils 12/28/2023 0.04  0.00 - 0.09 103/uL Final   Neutrophil % 12/28/2023 61.4  32.0 - 70.0 % Final   Lymphocyte % 12/28/2023 26.6  10.0 - 50.0 % Final   Monocyte % 12/28/2023 9.5  4.0 - 13.0 % Final   Eosinophil % 12/28/2023 1.7  1.0 - 5.0 % Final   Basophil% 12/28/2023 0.6  0.0 - 2.0 % Final   Immature Granulocyte % 12/28/2023 0.2  <=0.7 % Final   Immature Granulocyte Count 12/28/2023 0.01  <=0.06 10^3/L Final   Glucose 12/28/2023 86  70  - 110 mg/dL Final   Sodium 90/76/7974 131 (L)  136 - 145 mmol/L Final   Potassium 12/28/2023 4.9  3.6 - 5.1 mmol/L Final   Chloride 12/28/2023 95 (L)  97 - 109 mmol/L Final   Carbon Dioxide (CO2) 12/28/2023 31.1  22.0 - 32.0 mmol/L Final   Urea Nitrogen (BUN) 12/28/2023 22  7 - 25 mg/dL Final   Creatinine 90/76/7974 0.9  0.6 - 1.1 mg/dL Final   Glomerular Filtration Rate (eGFR) 12/28/2023 62  >60 mL/min/1.73sq m Final   Calcium  12/28/2023 10.9 (H)  8.7 - 10.3 mg/dL Final   AST  90/76/7974 20  8 - 39 U/L Final   ALT  12/28/2023 15  5 - 38 U/L Final   Alk Phos (alkaline Phosphatase) 12/28/2023 81  34 - 104 U/L Final   Albumin 12/28/2023 4.3  3.5 - 4.8 g/dL Final   Bilirubin, Total 12/28/2023 0.6  0.3 - 1.2 mg/dL Final   Protein, Total 12/28/2023 6.6  6.1 - 7.9 g/dL Final   A/G Ratio 90/76/7974 1.9  1.0 - 5.0 gm/dL Final   Cholesterol, Total 12/28/2023 219 (H)  100 - 200 mg/dL Final   Triglyceride 90/76/7974 245 (H)  35 - 199 mg/dL Final   HDL (High Density Lipoprotein) Cho* 12/28/2023 57.9  35.0 - 85.0 mg/dL Final   LDL Calculated 12/28/2023 887  0 - 130 mg/dL Final   VLDL Cholesterol 12/28/2023 49  mg/dL Final   Cholesterol/HDL Ratio 12/28/2023 3.8   Final   Thyroid Stimulating Hormone (TSH) 12/28/2023 1.442  0.450-5.330 uIU/ml uIU/mL Final   Hemoglobin A1C 12/28/2023 6.1 (H)  4.2 - 5.6 % Final   Average Blood Glucose (Calc) 12/28/2023 128  mg/dL Final  Appointment on 91/81/7974  Component Date Value Ref Range Status   Color 11/22/2023 Yellow  Colorless, Straw, Light Yellow, Yellow, Dark Yellow Final   Clarity 11/22/2023 Clear  Clear Final   Specific Gravity 11/22/2023 1.017  1.005 - 1.030 Final   pH, Urine 11/22/2023 5.5  5.0 - 8.0 Final   Protein, Urinalysis 11/22/2023 Negative  Negative mg/dL Final   Glucose, Urinalysis 11/22/2023 4+ (!)  Negative mg/dL Final   Ketones, Urinalysis 11/22/2023 Negative  Negative mg/dL Final   Blood, Urinalysis  11/22/2023 Negative  Negative Final   Nitrite, Urinalysis 11/22/2023 Negative  Negative Final   Leukocyte Esterase, Urinalysis 11/22/2023 2+ (!)  Negative Final   Bilirubin, Urinalysis 11/22/2023 Negative  Negative Final   Urobilinogen, Urinalysis 11/22/2023 0.2  0.2 - 1.0 mg/dL Final   WBC, UA 91/81/7974 40 (H)  <=5 /hpf Final   Red Blood Cells, Urinalysis 11/22/2023 2  <=3 /hpf Final   Bacteria, Urinalysis 11/22/2023 0-5  0 - 5 /hpf Final   Squamous Epithelial Cells, Urinaly* 11/22/2023 0  /hpf Final   Urine Culture, Routine - [facility] 11/22/2023 Final report (!)   Final   Result 1 - [facility] 11/22/2023 Escherichia  coli (!)   Final   Antimicrobial Susceptibility - Lab* 11/22/2023 Comment   Final  Appointment on 10/25/2023  Component Date Value Ref Range Status   Color 10/25/2023 Light Yellow  Colorless, Straw, Light Yellow, Yellow, Dark Yellow Final   Clarity 10/25/2023 Clear  Clear Final   Specific Gravity 10/25/2023 1.010  1.005 - 1.030 Final   pH, Urine 10/25/2023 6.0  5.0 - 8.0 Final   Protein, Urinalysis 10/25/2023 Negative  Negative mg/dL Final   Glucose, Urinalysis 10/25/2023 4+ (!)  Negative mg/dL Final   Ketones, Urinalysis 10/25/2023 Negative  Negative mg/dL Final   Blood, Urinalysis 10/25/2023 Negative  Negative Final   Nitrite, Urinalysis 10/25/2023 Negative  Negative Final   Leukocyte Esterase, Urinalysis 10/25/2023 3+ (!)  Negative Final   Bilirubin, Urinalysis 10/25/2023 Negative  Negative Final   Urobilinogen, Urinalysis 10/25/2023 0.2  0.2 - 1.0 mg/dL Final   WBC, UA 92/78/7974 71 (H)  <=5 /hpf Final   Red Blood Cells, Urinalysis 10/25/2023 1  <=3 /hpf Final   Bacteria, Urinalysis 10/25/2023 0-5  0 - 5 /hpf Final   Squamous Epithelial Cells, Urinaly* 10/25/2023 0  /hpf Final   Urine Culture, Routine - [facility] 10/25/2023 Final report   Final   Result 1 - [facility] 10/25/2023 Comment   Final  Office Visit on 09/23/2023  Component Date Value  Ref Range Status   WBC (White Blood Cell Count) 09/23/2023 7.2  4.1 - 10.2 103/uL Final   RBC (Red Blood Cell Count) 09/23/2023 5.16  4.04 - 5.48 106/uL Final   Hemoglobin 09/23/2023 16.3 (H)  12.0 - 15.0 gm/dL Final   Hematocrit 93/80/7974 49.8 (H)  35.0 - 47.0 % Final   MCV (Mean Corpuscular Volume) 09/23/2023 96.5  80.0 - 100.0 fl Final   MCH (Mean Corpuscular Hemoglobin) 09/23/2023 31.6 (H)  27.0 - 31.2 pg Final   MCHC (Mean Corpuscular Hemoglobin * 09/23/2023 32.7  32.0 - 36.0 gm/dL Final   Platelet Count 09/23/2023 230  150 - 450 103/uL Final   RDW-CV (Red Cell Distribution Widt* 09/23/2023 12.8  11.6 - 14.8 % Final   MPV (Mean Platelet Volume) 09/23/2023 10.2  9.4 - 12.4 fl Final   Neutrophils 09/23/2023 4.08  1.50 - 7.80 103/uL Final   Lymphocytes 09/23/2023 2.38  1.00 - 3.60 103/uL Final   Monocytes 09/23/2023 0.56  0.00 - 1.50 103/uL Final   Eosinophils 09/23/2023 0.10  0.00 - 0.55 103/uL Final   Basophils 09/23/2023 0.05  0.00 - 0.09 103/uL Final   Neutrophil % 09/23/2023 56.9  32.0 - 70.0 % Final   Lymphocyte % 09/23/2023 33.1  10.0 - 50.0 % Final   Monocyte % 09/23/2023 7.8  4.0 - 13.0 % Final   Eosinophil % 09/23/2023 1.4  1.0 - 5.0 % Final   Basophil% 09/23/2023 0.7  0.0 - 2.0 % Final   Immature Granulocyte % 09/23/2023 0.1  <=0.7 % Final   Immature Granulocyte Count 09/23/2023 0.01  <=0.06 10^3/L Final   Glucose 09/23/2023 67 (L)  70 - 110 mg/dL Final   Sodium 93/80/7974 137  136 - 145 mmol/L Final   Potassium 09/23/2023 4.8  3.6 - 5.1 mmol/L Final   Chloride 09/23/2023 98  97 - 109 mmol/L Final   Carbon Dioxide (CO2) 09/23/2023 34.4 (H)  22.0 - 32.0 mmol/L Final   Urea Nitrogen (BUN) 09/23/2023 19  7 - 25 mg/dL Final   Creatinine 93/80/7974 0.9  0.6 - 1.1 mg/dL Final   Glomerular Filtration Rate (eGFR)  09/23/2023 62  >60 mL/min/1.73sq m Final   Calcium  09/23/2023 11.6 (H)  8.7 - 10.3 mg/dL Final   AST  93/80/7974 21  8 - 39 U/L  Final   ALT  09/23/2023 13  5 - 38 U/L Final   Alk Phos (alkaline Phosphatase) 09/23/2023 84  34 - 104 U/L Final   Albumin 09/23/2023 4.5  3.5 - 4.8 g/dL Final   Bilirubin, Total 09/23/2023 0.5  0.3 - 1.2 mg/dL Final   Protein, Total 09/23/2023 6.9  6.1 - 7.9 g/dL Final   A/G Ratio 93/80/7974 1.9  1.0 - 5.0 gm/dL Final   Thyroid Stimulating Hormone (TSH) 09/23/2023 1.703  0.450-5.330 uIU/ml uIU/mL Final   Color 09/23/2023 Yellow  Colorless, Straw, Light Yellow, Yellow, Dark Yellow Final   Clarity 09/23/2023 Clear  Clear Final   Specific Gravity 09/23/2023 1.018  1.005 - 1.030 Final   pH, Urine 09/23/2023 5.5  5.0 - 8.0 Final   Protein, Urinalysis 09/23/2023 Negative  Negative mg/dL Final   Glucose, Urinalysis 09/23/2023 4+ (!)  Negative mg/dL Final   Ketones, Urinalysis 09/23/2023 Negative  Negative mg/dL Final   Blood, Urinalysis 09/23/2023 Negative  Negative Final   Nitrite, Urinalysis 09/23/2023 Negative  Negative Final   Leukocyte Esterase, Urinalysis 09/23/2023 Negative  Negative Final   Bilirubin, Urinalysis 09/23/2023 Negative  Negative Final   Urobilinogen, Urinalysis 09/23/2023 0.2  0.2 - 1.0 mg/dL Final   WBC, UA 93/80/7974 2  <=5 /hpf Final   Red Blood Cells, Urinalysis 09/23/2023 <1  <=3 /hpf Final   Bacteria, Urinalysis 09/23/2023 0-5  0 - 5 /hpf Final   Squamous Epithelial Cells, Urinaly* 09/23/2023 0  /hpf Final   Hemoglobin A1C 09/23/2023 6.0 (H)  4.2 - 5.6 % Final   Average Blood Glucose (Calc) 09/23/2023 126  mg/dL Final  Office Visit on 06/11/2023  Component Date Value Ref Range Status   Cholesterol, Total 06/11/2023 226 (H)  100 - 200 mg/dL Final   Triglyceride 96/92/7974 172  35 - 199 mg/dL Final   HDL (High Density Lipoprotein) Cho* 06/11/2023 70.0  35.0 - 85.0 mg/dL Final   LDL Calculated 06/11/2023 877  0 - 130 mg/dL Final   VLDL Cholesterol 06/11/2023 34  mg/dL Final   Cholesterol/HDL Ratio 06/11/2023 3.2   Final   WBC (White  Blood Cell Count) 06/11/2023 6.9  4.1 - 10.2 103/uL Final   RBC (Red Blood Cell Count) 06/11/2023 5.60 (H)  4.04 - 5.48 106/uL Final   Hemoglobin 06/11/2023 15.9 (H)  12.0 - 15.0 gm/dL Final   Hematocrit 96/92/7974 49.9 (H)  35.0 - 47.0 % Final   MCV (Mean Corpuscular Volume) 06/11/2023 89.1  80.0 - 100.0 fl Final   MCH (Mean Corpuscular Hemoglobin) 06/11/2023 28.4  27.0 - 31.2 pg Final   MCHC (Mean Corpuscular Hemoglobin * 06/11/2023 31.9 (L)  32.0 - 36.0 gm/dL Final   Platelet Count 06/11/2023 222  150 - 450 103/uL Final   RDW-CV (Red Cell Distribution Widt* 06/11/2023 16.1 (H)  11.6 - 14.8 % Final   MPV (Mean Platelet Volume) 06/11/2023 10.6  9.4 - 12.4 fl Final   Neutrophils 06/11/2023 4.02  1.50 - 7.80 103/uL Final   Lymphocytes 06/11/2023 2.15  1.00 - 3.60 103/uL Final   Monocytes 06/11/2023 0.57  0.00 - 1.50 103/uL Final   Eosinophils 06/11/2023 0.08  0.00 - 0.55 103/uL Final   Basophils 06/11/2023 0.05  0.00 - 0.09 103/uL Final   Neutrophil % 06/11/2023 58.4  32.0 - 70.0 %  Final   Lymphocyte % 06/11/2023 31.3  10.0 - 50.0 % Final   Monocyte % 06/11/2023 8.3  4.0 - 13.0 % Final   Eosinophil % 06/11/2023 1.2  1.0 - 5.0 % Final   Basophil% 06/11/2023 0.7  0.0 - 2.0 % Final   Immature Granulocyte % 06/11/2023 0.1  <=0.7 % Final   Immature Granulocyte Count 06/11/2023 0.01  <=0.06 10^3/L Final   Glucose 06/11/2023 71  70 - 110 mg/dL Final   Sodium 96/92/7974 134 (L)  136 - 145 mmol/L Final   Potassium 06/11/2023 4.8  3.6 - 5.1 mmol/L Final   Chloride 06/11/2023 94 (L)  97 - 109 mmol/L Final   Carbon Dioxide (CO2) 06/11/2023 35.4 (H)  22.0 - 32.0 mmol/L Final   Urea Nitrogen (BUN) 06/11/2023 16  7 - 25 mg/dL Final   Creatinine 96/92/7974 0.9  0.6 - 1.1 mg/dL Final   Glomerular Filtration Rate (eGFR) 06/11/2023 63  >60 mL/min/1.73sq m Final   Calcium  06/11/2023 11.5 (H)  8.7 - 10.3 mg/dL Final   AST  96/92/7974 20  8 - 39 U/L Final   ALT   06/11/2023 13  5 - 38 U/L Final   Alk Phos (alkaline Phosphatase) 06/11/2023 95  34 - 104 U/L Final   Albumin 06/11/2023 4.4  3.5 - 4.8 g/dL Final   Bilirubin, Total 06/11/2023 0.6  0.3 - 1.2 mg/dL Final   Protein, Total 06/11/2023 6.9  6.1 - 7.9 g/dL Final   A/G Ratio 96/92/7974 1.8  1.0 - 5.0 gm/dL Final   Color 96/92/7974 Light Yellow  Colorless, Straw, Light Yellow, Yellow, Dark Yellow Final   Clarity 06/11/2023 Clear  Clear Final   Specific Gravity 06/11/2023 1.014  1.005 - 1.030 Final   pH, Urine 06/11/2023 6.0  5.0 - 8.0 Final   Protein, Urinalysis 06/11/2023 Negative  Negative mg/dL Final   Glucose, Urinalysis 06/11/2023 4+ (!)  Negative mg/dL Final   Ketones, Urinalysis 06/11/2023 Negative  Negative mg/dL Final   Blood, Urinalysis 06/11/2023 Negative  Negative Final   Nitrite, Urinalysis 06/11/2023 Negative  Negative Final   Leukocyte Esterase, Urinalysis 06/11/2023 Negative  Negative Final   Bilirubin, Urinalysis 06/11/2023 Negative  Negative Final   Urobilinogen, Urinalysis 06/11/2023 0.2  0.2 - 1.0 mg/dL Final   WBC, UA 96/92/7974 <1  <=5 /hpf Final   Red Blood Cells, Urinalysis 06/11/2023 <1  <=3 /hpf Final   Bacteria, Urinalysis 06/11/2023 0-5  0 - 5 /hpf Final   Squamous Epithelial Cells, Urinaly* 06/11/2023 0  /hpf Final   Hemoglobin A1C 06/11/2023 6.3 (H)  4.2 - 5.6 % Final   Average Blood Glucose (Calc) 06/11/2023 134  mg/dL Final  Ancillary Procedure on 05/04/2023  Component Date Value Ref Range Status   LV Ejection Fraction (%) 05/04/2023 25%  % Final   Right Ventricle Systolic Pressure * 05/04/2023 32  mmHg Final   Left Atrium Diameter (cm) 05/04/2023 4.2  cm Final   LV End Diastolic Diameter (cm) 05/04/2023 5.8  cm Final   LV End Systolic Diameter (cm) 05/04/2023 5.3  cm Final   LV Septum Wall Thickness (cm) 05/04/2023 0.9  cm Final   LV Posterior Wall Thickness (cm) 05/04/2023 0.8  cm Final   Tricuspid Valve Regurgitation Grade  05/04/2023 mild   Final   Tricuspid Valve Regurgitation Max * 05/04/2023 2.6  m/s Final   Mitral Valve Regurgitation Grade 05/04/2023 mild   Final   Mitral Valve Stenosis Grade 05/04/2023 none   Final  Mitral Valve Stenosis Mean Gradien* 05/04/2023 2  mmHg Final   Aortic Valve Regurgitation Grade 05/04/2023 trivial   Final   Aortic Valve Stenosis Grade 05/04/2023 none   Final   Aortic Valve Stenosis Mean Gradien* 05/04/2023 10  mmHg Final   Aortic Valve Max Velocity (m/s) 05/04/2023 2.0  m/s Final   DIAGNOSIS: Primary hypertension  (primary encounter diagnosis)  HFrEF (heart failure with reduced ejection fraction) (CMS/HHS-HCC)  Mixed hyperlipidemia  Abnormal glucose  Gastroesophageal reflux disease without esophagitis   PLAN: HTN- stable, same meds  HLD- diet/exercise/statin, labs today Obesity- diet/exercise/weight loss GERD- stable on PPI Prediabetes- diet/exercise/water , labs 3 mo RTC 3 mo, sooner if needed     Attestation Statement:   I personally performed the service. (TP)  Reyes JONETTA Costa, MD, MD

## 2024-03-24 ENCOUNTER — Ambulatory Visit: Admitting: Physician Assistant

## 2024-03-24 VITALS — BP 120/74 | HR 62 | Ht 61.0 in | Wt 168.0 lb

## 2024-03-24 DIAGNOSIS — R3915 Urgency of urination: Secondary | ICD-10-CM

## 2024-03-24 LAB — BLADDER SCAN AMB NON-IMAGING: Scan Result: 0 mL

## 2024-03-24 NOTE — Progress Notes (Signed)
 "  03/24/2024 10:41 AM   Allison Mclean 11-May-1935 [id number]  CC: Chief Complaint  Patient presents with   Follow-up   HPI: Allison Mclean is a 88 y.o. female with PMH OA right with mixed incontinence previously on Gemtesa , nephrolithiasis, CAD, cardiomyopathy, heart failure on Lasix  and Jardiance, and chronic suprapubic pain who presents today for follow-up after resuming Myrbetriq  and with  RUS results.   Today she reports no acute concerns.  She has occasional dysuria, though none today.  Dr. Auston explained to her that this could have something to do with glucosuria in the setting of Jardiance use.  She is not sure if she is taking the Myrbetriq , but overall feels that her urinary pattern is acceptable.  Renal ultrasound dated 02/17/2024 with no hydronephrosis, shadowing stones, or bladder distention.  PVR 0mL.  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 DR model [phone] (serial  Number [id number]) ICD   Coronary artery disease    a.) LHC 02/07/2015: EF 45-50; global HK; normal coronary anatomy; no CAD. b.) LHC 12/21/2017: DCM with EF 29%; 20% p-m RCA, 20% mLAD; no interventions.   Cortical senile cataract    Dilated cardiomyopathy (HCC)    a.) TTE 01/26/2014: EF 50%. b.) TTE 09/16/2015: EF 40%. c.) TTE 05/14/2017: EF 30%. d.) TTE 06/28/2018: EF 30%.   DOE (dyspnea on exertion)    GERD (gastroesophageal reflux disease)    a.) resolved s/p Nissen fundoplication   HFrEF (heart failure with reduced ejection fraction) (HCC)    a.) TTE 01/23/2014: EF 50%; global HK, mild LA dilation, mild LVH; triv MR, mild AR/TR/PR. b.) TTE 09/16/2015: EF 40%; mild LVH; mild LA dilation; global HK; mild panvalvular regurgitation. c.) TTE 05/14/2017: EF 30%; mod global HK, mild LA dilation, G1DD, triv AR/MR/PR, mild MR. d.) TTE 06/28/2018: EF 30%; mod HK; triv AR/PR,  mild MR/TR; G1DD.   History of hiatal hernia    a.) s/p Nissen fundoplication   History of kidney stones    History of lumbar fusion (12/19/2013) (posterior lumbar fusion from L2-L5) 07/02/2015   Hyperlipidemia    Hypertension    IBS (irritable bowel syndrome)    [place]hammer esophagus    NSVT (nonsustained ventricular tachycardia) (HCC)    Osteoarthritis    Osteopenia, senile 07/02/2015   Skin cancer    SSS (sick sinus syndrome) (HCC)    a.) s/p AICD placement   Thoracic central spinal stenosis 07/02/2015   Thoracic facet syndrome (Left) 07/02/2015   Thoracic foraminal stenosis (Moderate to severe at: Right T5; Bilateral T9; Left T11) 07/02/2015   Thoracic spondylosis with radiculopathy (Left) 07/09/2015   Wears dentures    partial lower    Surgical History: Past Surgical History:  Procedure Laterality Date   ABDOMINAL EXPOSURE N/A 06/02/2016   Procedure: ABDOMINAL EXPOSURE;  Surgeon: Lonni GORMAN Blade, MD;  Location: [facility] OR;  Service: Vascular;  Laterality: N/A;   ABDOMINAL HYSTERECTOMY     ANTERIOR LAT LUMBAR FUSION N/A 06/02/2016   Procedure: L1-2 Left Anterior lateral lumbar interbody fusion;  Surgeon: Fairy Levels, MD;  Location: [facility] OR;  Service: Neurosurgery;  Laterality: N/A;  L1-2 Anterior lateral lumbar interbody fusion   ANTERIOR LUMBAR FUSION N/A 06/02/2016   Procedure: Lumbar five -sacral one Anterior lumbar interbody fusion with Dr. Lonni Blade; Lumbar one-two Left Anterior lateral lumbar interbody fusion;  Surgeon: Fairy Levels, MD;  Location: [facility]  OR;  Service: Neurosurgery;  Laterality: N/A;   APPENDECTOMY     APPLICATION OF INTRAOPERATIVE CT SCAN N/A 06/05/2016   Procedure: APPLICATION OF INTRAOPERATIVE CT SCAN;  Surgeon: Fairy Levels, MD;  Location: [facility] OR;  Service: Neurosurgery;  Laterality: N/A;   BACK SURGERY     2005, 2006, 2015   BLEPHAROPLASTY Bilateral    CARDIAC CATHETERIZATION N/A 02/07/2015   Procedure: Left Heart Cath and Coronary  Angiography;  Surgeon: Vinie DELENA Jude, MD;  Location: [facility] INVASIVE CV LAB;  Service: Cardiovascular;  Laterality: N/A;   CHOLECYSTECTOMY     ESOPHAGOGASTRODUODENOSCOPY (EGD) WITH PROPOFOL  N/A 02/03/2016   Procedure: ESOPHAGOGASTRODUODENOSCOPY (EGD) WITH PROPOFOL ;  Surgeon: Rogelia Copping, MD;  Location: [facility] SURGERY CNTR;  Service: Endoscopy;  Laterality: N/A;   ESOPHAGOGASTRODUODENOSCOPY (EGD) WITH PROPOFOL  N/A 03/02/2016   Procedure: ESOPHAGOGASTRODUODENOSCOPY (EGD) WITH PROPOFOL ;  Surgeon: Rogelia Copping, MD;  Location: [facility];  Service: Endoscopy;  Laterality: N/A;   ICD IMPLANT N/A 03/08/2018   [facility] Assura DR model [phone] (serial  Number [id number]) ICD by Dr Kelsie for treatment of syncope and reduced EF.  R sided implanted due to persistent L SVC   LEFT HEART CATH AND CORONARY ANGIOGRAPHY Left 12/21/2017   Procedure: LEFT HEART CATH AND CORONARY ANGIOGRAPHY;  Surgeon: Ammon Blunt, MD;  Location: [facility] INVASIVE CV LAB;  Service: Cardiovascular;  Laterality: Left;   LEFT HEART CATH AND CORONARY ANGIOGRAPHY Left 02/11/2011   Procedure: LEFT HEART CATH AND CORONARY ANGIOGRAPHY; Location: [facility]; Surgeon: Vinie Jude, MD   NISSEN FUNDOPLICATION     POSTERIOR LUMBAR FUSION 4 LEVEL N/A 06/05/2016   Procedure: Thoracic six to pelvis fixation with Smith-Peterson Thoracic osteotomies at Thoracic ten-Thoracic Twelve with AIRO, Removal hardware lumbar two to lumbar five;  Surgeon: Fairy Levels, MD;  Location: [facility] OR;  Service: Neurosurgery;  Laterality: N/A;   SPINAL GROWTH RODS Bilateral 2018   SYMPATHECTOMY     ? improve blood flow to legs   TONSILECTOMY, ADENOIDECTOMY, BILATERAL MYRINGOTOMY AND TUBES     TOTAL HIP ARTHROPLASTY Right 06/20/2021   Procedure: TOTAL HIP ARTHROPLASTY ANTERIOR APPROACH;  Surgeon: Kathlynn Sharper, MD;  Location: [facility] ORS;  Service: Orthopedics;  Laterality: Right;    Home Medications:  Allergies as of 03/24/2024       Reactions    Atorvastatin Other (See Comments)   Chest pain     Rosuvastatin Other (See Comments)   Causes chest pain          Medication List        Accurate as of March 24, 2024 10:41 AM. If you have any questions, ask your nurse or doctor.          amLODipine  2.5 MG tablet Commonly known as: NORVASC  Take 2.5 mg by mouth every morning.   aspirin  EC 81 MG tablet Take 81 mg by mouth at bedtime.   Blood Pressure Digital Soln Kit   CALCIUM  CITRATE-VITAMIN D  PO Take 1 tablet by mouth 2 (two) times daily.   carboxymethylcellulose 0.5 % Soln Commonly known as: REFRESH PLUS Place 1 drop into both eyes 3 (three) times daily as needed (dry eyes).   carvedilol 3.125 MG tablet Commonly known as: COREG Take 3.125 mg by mouth.   doxycycline 100 MG capsule Commonly known as: VIBRAMYCIN Take 100 mg by mouth 2 (two) times daily.   enoxaparin  40 MG/0.4ML injection Commonly known as: LOVENOX  Inject 0.4 mLs (40 mg total) into the skin daily for 14 days.   Fiber 625  MG Tabs Take 625 mg by mouth.   fluticasone  50 MCG/ACT nasal spray Commonly known as: FLONASE  Place 2 sprays into both nostrils at bedtime as needed for allergies.   furosemide  20 MG tablet Commonly known as: LASIX  Take 20 mg by mouth daily as needed for fluid.   HYDROcodone -acetaminophen  5-325 MG tablet Commonly known as: NORCO/VICODIN Take 1-2 tablets by mouth every 4 (four) hours as needed for moderate pain (pain score 4-6).   Jardiance 10 MG Tabs tablet Generic drug: empagliflozin Take 10 mg by mouth daily.   mirabegron  ER 50 MG Tb24 tablet Commonly known as: MYRBETRIQ  Take 1 tablet (50 mg total) by mouth daily.   multivitamin with minerals Tabs tablet Take 1 tablet by mouth daily.   nystatin  powder Commonly known as: MYCOSTATIN /NYSTOP  Apply 1 g topically daily. Under bil breasts   omeprazole 40 MG capsule Commonly known as: PRILOSEC Take 40 mg by mouth.   potassium chloride  SA 20 MEQ  tablet Commonly known as: KLOR-CON  M Take 20 mEq by mouth every evening.   pravastatin  80 MG tablet Commonly known as: PRAVACHOL  Take 80 mg by mouth at bedtime.   sacubitril-valsartan  24-26 MG Commonly known as: ENTRESTO SMARTSIG:1 Tablet(s) By Mouth Every 12 Hours   spironolactone 25 MG tablet Commonly known as: ALDACTONE Take 25 mg by mouth daily.   Systane Nighttime Oint Place 1 application into both eyes at bedtime as needed (dry eyes).   traMADol  50 MG tablet Commonly known as: ULTRAM  Take 1 tablet (50 mg total) by mouth every 6 (six) hours.   valsartan -hydrochlorothiazide  160-12.5 MG tablet Commonly known as: DIOVAN -HCT Take 1 tablet by mouth every evening.        Allergies:  Allergies[1]  Family History: Family History  Problem Relation Age of Onset   Diabetes Mother    Arthritis Mother    Heart disease Mother    Hypertension Mother    Diabetes Father    Asthma Father    Heart disease Father    Hypertension Father    Stroke Father    Breast cancer Neg Hx     Social History:   reports that she has never smoked. She has never used smokeless tobacco. She reports that she does not drink alcohol  and does not use drugs.  Physical Exam: BP 120/74   Pulse 62   Ht 5' 1 (1.549 m)   Wt 168 lb (76.2 kg)   SpO2 95%   BMI 31.74 kg/m   Constitutional:  Alert and oriented, no acute distress, nontoxic appearing HEENT: [place], AT Cardiovascular: No clubbing, cyanosis, or edema Respiratory: Normal respiratory effort, no increased work of breathing Skin: No rashes, bruises or suspicious lesions Neurologic: Grossly intact, no focal deficits, moving all 4 extremities Psychiatric: Normal mood and affect  Laboratory Data: Results for orders placed or performed in visit on 03/24/24  Bladder Scan (Post Void Residual) in office   Collection Time: 03/24/24 10:40 AM  Result Value Ref Range   Scan Result 0 ml   Pertinent Imaging: Results for orders placed during the  hospital encounter of 02/17/24  US  RENAL  Narrative CLINICAL DATA:  Suprapubic pain after UTI.  EXAM: RENAL / URINARY TRACT ULTRASOUND COMPLETE  COMPARISON:  None Available.  FINDINGS: Right Kidney:  Renal measurements: 10.1 cm x 4.4 cm x 5.4 cm = volume: 126.4 mL. Echogenicity within normal limits. No mass or hydronephrosis visualized.  Left Kidney:  Renal measurements: 9.6 cm x 4.2 cm x 4.3 cm = volume: 90.9 mL.  Echogenicity within normal limits. No mass or hydronephrosis visualized.  Bladder:  Appears normal for degree of bladder distention.  Other:  None.  IMPRESSION: Unremarkable renal ultrasound.   Electronically Signed By: Suzen Dials M.D. On: 02/18/2024 23:17  I personally reviewed the images referenced above and note no hydronephrosis or shadowing stones.  Assessment & Plan:   1. Urinary urgency (Primary) Unclear if she is taking Myrbetriq , history is somewhat challenging today.  She has no concerns, renal ultrasound normal.  She can follow-up with us  as needed. - Bladder Scan (Post Void Residual) in office  Return if symptoms worsen or fail to improve.  Lucie Hones, PA-C  [facility] [place] [address] [place], [place] [address] [phone]     [1]  Allergies Allergen Reactions   Atorvastatin Other (See Comments)    Chest pain     Rosuvastatin Other (See Comments)    Causes chest pain     "
